# Patient Record
Sex: Male | Born: 1980 | Race: Black or African American | Hispanic: No | Marital: Single | Smoking: Former smoker
Health system: Southern US, Community
[De-identification: ages and names within clinical notes are randomized; demographics above are authoritative.]

## PROBLEM LIST (undated history)

## (undated) DIAGNOSIS — K2289 Other specified disease of esophagus: Secondary | ICD-10-CM

## (undated) DIAGNOSIS — K6389 Other specified diseases of intestine: Secondary | ICD-10-CM

## (undated) DIAGNOSIS — D649 Anemia, unspecified: Secondary | ICD-10-CM

## (undated) DIAGNOSIS — E119 Type 2 diabetes mellitus without complications: Secondary | ICD-10-CM

## (undated) DIAGNOSIS — Z794 Long term (current) use of insulin: Secondary | ICD-10-CM

## (undated) DIAGNOSIS — K317 Polyp of stomach and duodenum: Secondary | ICD-10-CM

## (undated) DIAGNOSIS — E538 Deficiency of other specified B group vitamins: Secondary | ICD-10-CM

## (undated) DIAGNOSIS — D490 Neoplasm of unspecified behavior of digestive system: Secondary | ICD-10-CM

## (undated) DIAGNOSIS — G629 Polyneuropathy, unspecified: Secondary | ICD-10-CM

## (undated) DIAGNOSIS — K3189 Other diseases of stomach and duodenum: Secondary | ICD-10-CM

## (undated) DIAGNOSIS — E1169 Type 2 diabetes mellitus with other specified complication: Secondary | ICD-10-CM

## (undated) DIAGNOSIS — E785 Hyperlipidemia, unspecified: Secondary | ICD-10-CM

## (undated) DIAGNOSIS — J189 Pneumonia, unspecified organism: Secondary | ICD-10-CM

## (undated) DIAGNOSIS — K5730 Diverticulosis of large intestine without perforation or abscess without bleeding: Secondary | ICD-10-CM

## (undated) DIAGNOSIS — N189 Chronic kidney disease, unspecified: Secondary | ICD-10-CM

## (undated) DIAGNOSIS — C801 Malignant (primary) neoplasm, unspecified: Secondary | ICD-10-CM

## (undated) DIAGNOSIS — K219 Gastro-esophageal reflux disease without esophagitis: Secondary | ICD-10-CM

## (undated) DIAGNOSIS — I10 Essential (primary) hypertension: Secondary | ICD-10-CM

## (undated) DIAGNOSIS — K4090 Unilateral inguinal hernia, without obstruction or gangrene, not specified as recurrent: Secondary | ICD-10-CM

## (undated) DIAGNOSIS — G4730 Sleep apnea, unspecified: Secondary | ICD-10-CM

## (undated) HISTORY — DX: Gastro-esophageal reflux disease without esophagitis: K21.9

## (undated) HISTORY — DX: Diverticulosis of large intestine without perforation or abscess without bleeding: K57.30

## (undated) HISTORY — PX: OTHER SURGICAL HISTORY: SHX169

## (undated) HISTORY — DX: Sleep apnea, unspecified: G47.30

## (undated) HISTORY — DX: Neoplasm of unspecified behavior of digestive system: D49.0

## (undated) HISTORY — DX: Pneumonia, unspecified organism: J18.9

## (undated) HISTORY — DX: Other specified diseases of intestine: K63.89

## (undated) HISTORY — DX: Hyperlipidemia, unspecified: E78.5

## (undated) HISTORY — PX: COLON SURGERY: SHX602

## (undated) HISTORY — DX: Other diseases of stomach and duodenum: K31.89

---

## 2005-04-07 ENCOUNTER — Emergency Department: Payer: Self-pay | Admitting: Emergency Medicine

## 2013-04-23 ENCOUNTER — Emergency Department: Payer: Self-pay | Admitting: Emergency Medicine

## 2013-04-23 LAB — URINALYSIS, COMPLETE
Bilirubin,UR: NEGATIVE
Glucose,UR: NEGATIVE mg/dL (ref 0–75)
Hyaline Cast: 4
Ketone: NEGATIVE
Leukocyte Esterase: NEGATIVE
Nitrite: NEGATIVE
Ph: 5 (ref 4.5–8.0)
Protein: NEGATIVE
RBC,UR: 127 /HPF (ref 0–5)
WBC UR: 6 /HPF (ref 0–5)

## 2013-04-23 LAB — CBC
HCT: 31.7 % — ABNORMAL LOW (ref 40.0–52.0)
HGB: 9.2 g/dL — ABNORMAL LOW (ref 13.0–18.0)
MCH: 18.1 pg — ABNORMAL LOW (ref 26.0–34.0)
MCV: 62 fL — ABNORMAL LOW (ref 80–100)
Platelet: 318 10*3/uL (ref 150–440)
RBC: 5.1 10*6/uL (ref 4.40–5.90)
WBC: 12.1 10*3/uL — ABNORMAL HIGH (ref 3.8–10.6)

## 2013-04-23 LAB — BASIC METABOLIC PANEL
Anion Gap: 9 (ref 7–16)
BUN: 15 mg/dL (ref 7–18)
Calcium, Total: 9.2 mg/dL (ref 8.5–10.1)
Chloride: 103 mmol/L (ref 98–107)
Co2: 26 mmol/L (ref 21–32)
Creatinine: 1.78 mg/dL — ABNORMAL HIGH (ref 0.60–1.30)
EGFR (African American): 57 — ABNORMAL LOW
Glucose: 188 mg/dL — ABNORMAL HIGH (ref 65–99)
Osmolality: 281 (ref 275–301)
Potassium: 3.6 mmol/L (ref 3.5–5.1)
Sodium: 138 mmol/L (ref 136–145)

## 2013-04-23 LAB — HEPATIC FUNCTION PANEL A (ARMC)
Alkaline Phosphatase: 69 U/L (ref 50–136)
Bilirubin, Direct: 0.1 mg/dL (ref 0.00–0.20)
Bilirubin,Total: 0.4 mg/dL (ref 0.2–1.0)
SGOT(AST): 13 U/L — ABNORMAL LOW (ref 15–37)
SGPT (ALT): 20 U/L (ref 12–78)
Total Protein: 7.9 g/dL (ref 6.4–8.2)

## 2015-08-18 ENCOUNTER — Emergency Department
Admission: EM | Admit: 2015-08-18 | Discharge: 2015-08-18 | Disposition: A | Discharge status: Home / Self Care | Payer: Self-pay | Attending: Emergency Medicine | Admitting: Emergency Medicine

## 2015-08-18 ENCOUNTER — Emergency Department: Payer: Self-pay

## 2015-08-18 DIAGNOSIS — R110 Nausea: Secondary | ICD-10-CM | POA: Insufficient documentation

## 2015-08-18 DIAGNOSIS — R1031 Right lower quadrant pain: Secondary | ICD-10-CM | POA: Insufficient documentation

## 2015-08-18 LAB — CBC WITH DIFFERENTIAL/PLATELET
Basophils Absolute: 0.1 10*3/uL (ref 0–0.1)
Basophils Relative: 1 %
EOS ABS: 0.1 10*3/uL (ref 0–0.7)
Eosinophils Relative: 1 %
HCT: 33 % — ABNORMAL LOW (ref 40.0–52.0)
Hemoglobin: 9.6 g/dL — ABNORMAL LOW (ref 13.0–18.0)
LYMPHS ABS: 2.8 10*3/uL (ref 1.0–3.6)
LYMPHS PCT: 41 %
MCH: 18.1 pg — ABNORMAL LOW (ref 26.0–34.0)
MCHC: 29.2 g/dL — AB (ref 32.0–36.0)
MCV: 62 fL — ABNORMAL LOW (ref 80.0–100.0)
Monocytes Absolute: 0.7 10*3/uL (ref 0.2–1.0)
Monocytes Relative: 10 %
NEUTROS ABS: 3.1 10*3/uL (ref 1.4–6.5)
Neutrophils Relative %: 47 %
Platelets: 240 10*3/uL (ref 150–440)
RBC: 5.32 MIL/uL (ref 4.40–5.90)
RDW: 18.9 % — AB (ref 11.5–14.5)
WBC: 6.8 10*3/uL (ref 3.8–10.6)

## 2015-08-18 LAB — URINALYSIS COMPLETE WITH MICROSCOPIC (ARMC ONLY)
Bacteria, UA: NONE SEEN
Bilirubin Urine: NEGATIVE
Glucose, UA: NEGATIVE mg/dL
Hgb urine dipstick: NEGATIVE
KETONES UR: NEGATIVE mg/dL
Leukocytes, UA: NEGATIVE
Nitrite: NEGATIVE
PROTEIN: NEGATIVE mg/dL
Specific Gravity, Urine: 1.027 (ref 1.005–1.030)
pH: 5 (ref 5.0–8.0)

## 2015-08-18 LAB — COMPREHENSIVE METABOLIC PANEL
ALBUMIN: 4.1 g/dL (ref 3.5–5.0)
ALK PHOS: 55 U/L (ref 38–126)
ALT: 13 U/L — ABNORMAL LOW (ref 17–63)
ANION GAP: 8 (ref 5–15)
AST: 20 U/L (ref 15–41)
BILIRUBIN TOTAL: 0.4 mg/dL (ref 0.3–1.2)
BUN: 12 mg/dL (ref 6–20)
CALCIUM: 8.9 mg/dL (ref 8.9–10.3)
CO2: 26 mmol/L (ref 22–32)
Chloride: 106 mmol/L (ref 101–111)
Creatinine, Ser: 1.41 mg/dL — ABNORMAL HIGH (ref 0.61–1.24)
GFR calc Af Amer: >60 mL/min (ref >60)
GLUCOSE: 90 mg/dL (ref 65–99)
POTASSIUM: 3.7 mmol/L (ref 3.5–5.1)
Sodium: 140 mmol/L (ref 135–145)
TOTAL PROTEIN: 7.6 g/dL (ref 6.5–8.1)

## 2015-08-18 LAB — LIPASE, BLOOD: Lipase: 29 U/L (ref 22–51)

## 2015-08-18 MED ORDER — IOHEXOL 300 MG/ML  SOLN
100.0000 mL | Freq: Once | INTRAMUSCULAR | Status: AC | PRN
  Administered 2015-08-18: 100 mL via INTRAVENOUS
  Filled 2015-08-18: qty 100

## 2015-08-18 MED ORDER — TAMSULOSIN HCL 0.4 MG PO CAPS: 0.4000 mg | ORAL_CAPSULE | Freq: Every day | ORAL | Status: DC

## 2015-08-18 MED ORDER — IOHEXOL 240 MG/ML SOLN
25.0000 mL | Freq: Once | INTRAMUSCULAR | Status: AC | PRN
  Administered 2015-08-18: 25 mL via ORAL
  Filled 2015-08-18: qty 25

## 2015-08-18 MED ORDER — ONDANSETRON HCL 4 MG/2ML IJ SOLN
4.0000 mg | Freq: Once | INTRAMUSCULAR | Status: AC
  Administered 2015-08-18: 4 mg via INTRAVENOUS
  Filled 2015-08-18: qty 2

## 2015-08-18 MED ORDER — IBUPROFEN 800 MG PO TABS: 800.0000 mg | ORAL_TABLET | Freq: Three times a day (TID) | ORAL | Status: DC | PRN

## 2015-08-18 MED ORDER — MORPHINE SULFATE (PF) 2 MG/ML IV SOLN
2.0000 mg | Freq: Once | INTRAVENOUS | Status: AC
  Administered 2015-08-18: 2 mg via INTRAVENOUS
  Filled 2015-08-18: qty 1

## 2015-08-18 MED ORDER — HYDROCODONE-ACETAMINOPHEN 5-325 MG PO TABS
1.0000 | ORAL_TABLET | Freq: Four times a day (QID) | ORAL | Status: DC | PRN
Start: 2015-08-18 — End: 2016-09-08

## 2015-08-18 MED ORDER — SODIUM CHLORIDE 0.9 % IV BOLUS (SEPSIS)
1000.0000 mL | Freq: Once | INTRAVENOUS | Status: AC
  Administered 2015-08-18: 1000 mL via INTRAVENOUS

## 2015-08-18 NOTE — ED Notes (Signed)
Pt states that he started having rlq pain over the past couple weeks, states that he has gotten worse, states that he has a hx of kidney stones on the llq in the past

## 2015-08-18 NOTE — Discharge Instructions (Signed)
You were evaluated for intermittent right-sided abdominal pain, and your CT scan and laboratory evaluation are reassuring. Because this feels similar to prior kidney stones, and the symptoms may be related to kidney stones, I am going to place you on medications to help with pain control as well as Flomax to help with possible kidney stone. You should follow up with primary care physician, you're referred to the [facility] clinic for follow-up.  Return to the emergency room for any worsening condition including fever, worsening pain, or bloody stool, vomiting, inability to urinate, or any other symptoms concerning to you.   Abdominal Pain, Adult Many things can cause belly (abdominal) pain. Most times, the belly pain is not dangerous. Many cases of belly pain can be watched and treated at home. HOME CARE   Do not take medicines that help you go poop (laxatives) unless told to by your doctor.  Only take medicine as told by your doctor.  Eat or drink as told by your doctor. Your doctor will tell you if you should be on a special diet. GET HELP IF:  You do not know what is causing your belly pain.  You have belly pain while you are sick to your stomach (nauseous) or have runny poop (diarrhea).  You have pain while you pee or poop.  Your belly pain wakes you up at night.  You have belly pain that gets worse or better when you eat.  You have belly pain that gets worse when you eat fatty foods.  You have a fever. GET HELP RIGHT AWAY IF:   The pain does not go away within 2 hours.  You keep throwing up (vomiting).  The pain changes and is only in the right or left part of the belly.  You have bloody or tarry looking poop. MAKE SURE YOU:   Understand these instructions.  Will watch your condition.  Will get help right away if you are not doing well or get worse.   This information is not intended to replace advice given to you by your health care provider. Make sure you discuss  any questions you have with your health care provider.   Document Released: 04/06/2008 Document Revised: 11/09/2014 Document Reviewed: 06/28/2013 Elsevier Interactive Patient Education 2016 [place]. Kidney Stones Kidney stones (urolithiasis) are deposits that form inside your kidneys. The intense pain is caused by the stone moving through the urinary tract. When the stone moves, the ureter goes into spasm around the stone. The stone is usually passed in the urine.  CAUSES   A disorder that makes certain neck glands produce too much parathyroid hormone (primary hyperparathyroidism).  A buildup of uric acid crystals, similar to gout in your joints.  Narrowing (stricture) of the ureter.  A kidney obstruction present at birth (congenital obstruction).  Previous surgery on the kidney or ureters.  Numerous kidney infections. SYMPTOMS   Feeling sick to your stomach (nauseous).  Throwing up (vomiting).  Blood in the urine (hematuria).  Pain that usually spreads (radiates) to the groin.  Frequency or urgency of urination. DIAGNOSIS   Taking a history and physical exam.  Blood or urine tests.  CT scan.  Occasionally, an examination of the inside of the urinary bladder (cystoscopy) is performed. TREATMENT   Observation.  Increasing your fluid intake.  Extracorporeal shock wave lithotripsy--This is a noninvasive procedure that uses shock waves to break up kidney stones.  Surgery may be needed if you have severe pain or persistent obstruction. There are various  surgical procedures. Most of the procedures are performed with the use of small instruments. Only small incisions are needed to accommodate these instruments, so recovery time is minimized. The size, location, and chemical composition are all important variables that will determine the proper choice of action for you. Talk to your health care provider to better understand your situation so that you will minimize the  risk of injury to yourself and your kidney.  HOME CARE INSTRUCTIONS   Drink enough water and fluids to keep your urine clear or pale yellow. This will help you to pass the stone or stone fragments.  Strain all urine through the provided strainer. Keep all particulate matter and stones for your health care provider to see. The stone causing the pain may be as small as a grain of salt. It is very important to use the strainer each and every time you pass your urine. The collection of your stone will allow your health care provider to analyze it and verify that a stone has actually passed. The stone analysis will often identify what you can do to reduce the incidence of recurrences.  Only take over-the-counter or prescription medicines for pain, discomfort, or fever as directed by your health care provider.  Keep all follow-up visits as told by your health care provider. This is important.  Get follow-up X-rays if required. The absence of pain does not always mean that the stone has passed. It may have only stopped moving. If the urine remains completely obstructed, it can cause loss of kidney function or even complete destruction of the kidney. It is your responsibility to make sure X-rays and follow-ups are completed. Ultrasounds of the kidney can show blockages and the status of the kidney. Ultrasounds are not associated with any radiation and can be performed easily in a matter of minutes.  Make changes to your daily diet as told by your health care provider. You may be told to:  Limit the amount of salt that you eat.  Eat 5 or more servings of fruits and vegetables each day.  Limit the amount of meat, poultry, fish, and eggs that you eat.  Collect a 24-hour urine sample as told by your health care provider.You may need to collect another urine sample every 6-12 months. SEEK MEDICAL CARE IF:  You experience pain that is progressive and unresponsive to any pain medicine you have been  prescribed. SEEK IMMEDIATE MEDICAL CARE IF:   Pain cannot be controlled with the prescribed medicine.  You have a fever or shaking chills.  The severity or intensity of pain increases over 18 hours and is not relieved by pain medicine.  You develop a new onset of abdominal pain.  You feel faint or pass out.  You are unable to urinate.   This information is not intended to replace advice given to you by your health care provider. Make sure you discuss any questions you have with your health care provider.   Document Released: 10/19/2005 Document Revised: 07/10/2015 Document Reviewed: 03/22/2013 Elsevier Interactive Patient Education [employer].

## 2015-08-18 NOTE — ED Provider Notes (Signed)
[facility] Emergency Department Provider Note   ____________________________________________  Time seen: On arrival to ED room I have reviewed the triage vital signs and the triage nursing note.  HISTORY  Chief Complaint Abdominal Pain   Historian Patient  HPI Luke [name] is a 34 y.o. male is here for evaluation of right lower quadrant pain. He states he had a pretty severe episode earlier today where he was nauseated, and cold sweat, with severe sharp right lower quadrant pain. He thinks this pain is somewhat similar to when he had kidney stones in the past. Although he is denying hematuria or dysuria or flank pain. He states that he has had some intermittent pain over the past several weeks, but today was much worse. He is denying any trauma or overuse injury. States currently his pain is 5 out of 10. No fever.    No past medical history on file.  There are no active problems to display for this patient.   No past surgical history on file.  Current Outpatient Rx  Name  Route  Sig  Dispense  Refill  . HYDROcodone-acetaminophen (NORCO/VICODIN) 5-325 MG tablet   Oral   Take 1 tablet by mouth every 6 (six) hours as needed for moderate pain.   5 tablet   0   . ibuprofen (ADVIL,MOTRIN) 800 MG tablet   Oral   Take 1 tablet (800 mg total) by mouth every 8 (eight) hours as needed.   30 tablet   0   . tamsulosin (FLOMAX) 0.4 MG CAPS capsule   Oral   Take 1 capsule (0.4 mg total) by mouth daily.   7 capsule   0     Allergies Review of patient's allergies indicates no known allergies.  No family history on file.  Social History Social History  Substance Use Topics  . Smoking status: Not on file  . Smokeless tobacco: Not on file  . Alcohol Use: Not on file  lives at home, no primary care physician.  Review of Systems  Constitutional: Negative for fever. Eyes: Negative for visual changes. ENT: Negative for sore  throat. Cardiovascular: Negative for chest pain. Respiratory: Negative for shortness of breath. Gastrointestinal: Negative for vomiting and diarrhea. Genitourinary: Negative for dysuria. denies testicular or penis pain. Musculoskeletal: Negative for back pain. Skin: Negative for rash. Neurological: Negative for headache. 10 point Review of Systems otherwise negative ____________________________________________   PHYSICAL EXAM:  VITAL SIGNS: ED Triage Vitals  Enc Vitals Group     BP 08/18/15 1458 145/89 mmHg     Pulse Rate 08/18/15 1458 91     Resp 08/18/15 1458 20     Temp 08/18/15 1458 98.5 F (36.9 C)     Temp Source 08/18/15 1458 Oral     SpO2 08/18/15 1458 100 %     Weight 08/18/15 1458 190 lb (86.183 kg)     Height 08/18/15 1458 5\' 9"  (1.753 m)     Head Cir --      Peak Flow --      Pain Score 08/18/15 1514 7     Pain Loc --      Pain Edu? --      Excl. in [place]? --      Constitutional: Alert and oriented. Well appearing and in no distress. Eyes: Conjunctivae are normal. PERRL. Normal extraocular movements. ENT   Head: Normocephalic and atraumatic.   Nose: No congestion/rhinnorhea.   Mouth/Throat: Mucous membranes are moist.   Neck: No stridor. Cardiovascular/Chest:  Normal rate, regular rhythm.  No murmurs, rubs, or gallops. Respiratory: Normal respiratory effort without tachypnea nor retractions. Breath sounds are clear and equal bilaterally. No wheezes/rales/rhonchi. Gastrointestinal: Soft. No distention, no guarding, no rebound. Moderate tenderness to palpation in the right lower quadrant.  Genitourinary/rectal:Deferred Musculoskeletal: Nontender with normal range of motion in all extremities. No joint effusions.  No lower extremity tenderness.  No edema. Neurologic:  Normal speech and language. No gross or focal neurologic deficits are appreciated. Skin:  Skin is warm, dry and intact. No rash noted. Psychiatric: Mood and affect are normal. Speech and  behavior are normal. Patient exhibits appropriate insight and judgment.  ____________________________________________   EKG I, Lisa Roca, MD, the attending physician have personally viewed and interpreted all ECGs.  No EKG performed ____________________________________________  LABS (pertinent positives/negatives)  Lipase 29 , Oriented metabolic panel significant for creatinine 1.41 and otherwise no acute abnormalities White blood count 6.8, hemoglobin 9.6, platelet count 240 Urinalysis negative for signs of infection or red blood cells  ____________________________________________  RADIOLOGY All Xrays were viewed by me. Imaging interpreted by Radiologist.  CT abdomen and pelvis with contrast:  IMPRESSION: 1. No acute findings. No findings to explain right lower quadrant pain. Normal appendix visualized. 2. Normal kidneys, ureters and bladder. No evidence of a renal or ureteral stone or obstructive uropathy. 3. Scattered colonic diverticula without diverticulitis. No other abnormalities. __________________________________________  PROCEDURES  Procedure(s) performed: None  Critical Care performed: None  ____________________________________________   ED COURSE / ASSESSMENT AND PLAN  CONSULTATIONS: None  Pertinent labs & imaging results that were available during my care of the patient were reviewed by me and considered in my medical decision making (see chart for details).   Clinically unsure if this is due to a kidney stone pain versus possible appendicitis with right lower quadrant tenderness to palpation. I discussed with him obtaining CT to rule out the most concerning surgical emergency in this location being appendicitis.   CT the abdomen is reassuring, without evidence of acute surgical emergency. The ureter showed no sign of hydronephrosis, however given the patient's symptoms are intermittent, and feels similar to prior kidney stones, I will go ahead  and treat him symptomatically for possible kidney stone. I've asked him to follow up with primary care physician, and referred to [facility] clinic.   Patient / Family / Caregiver informed of clinical course, medical decision-making process, and agree with plan.   I discussed return precautions, follow-up instructions, and discharged instructions with patient and/or family.  ___________________________________________   FINAL CLINICAL IMPRESSION(S) / ED DIAGNOSES   Final diagnoses:  Right lower quadrant abdominal pain       Lisa Roca, MD 08/18/15 1736

## 2015-08-18 NOTE — ED Notes (Signed)
Right flank and abd pain  Hx of kidney stones   Feels the same

## 2016-09-07 ENCOUNTER — Encounter: Payer: Self-pay | Admitting: Emergency Medicine

## 2016-09-07 ENCOUNTER — Emergency Department: Payer: BLUE CROSS/BLUE SHIELD

## 2016-09-07 ENCOUNTER — Inpatient Hospital Stay
Admission: EM | Admit: 2016-09-07 | Discharge: 2016-09-08 | DRG: 177 | Disposition: A | Discharge status: Home / Self Care | Payer: BLUE CROSS/BLUE SHIELD | Attending: Internal Medicine | Admitting: Internal Medicine

## 2016-09-07 DIAGNOSIS — F17210 Nicotine dependence, cigarettes, uncomplicated: Secondary | ICD-10-CM | POA: Diagnosis present

## 2016-09-07 DIAGNOSIS — T40601A Poisoning by unspecified narcotics, accidental (unintentional), initial encounter: Secondary | ICD-10-CM | POA: Diagnosis present

## 2016-09-07 DIAGNOSIS — J690 Pneumonitis due to inhalation of food and vomit: Principal | ICD-10-CM | POA: Diagnosis present

## 2016-09-07 DIAGNOSIS — Z79899 Other long term (current) drug therapy: Secondary | ICD-10-CM | POA: Diagnosis not present

## 2016-09-07 DIAGNOSIS — N170 Acute kidney failure with tubular necrosis: Secondary | ICD-10-CM | POA: Diagnosis present

## 2016-09-07 DIAGNOSIS — N183 Chronic kidney disease, stage 3 (moderate): Secondary | ICD-10-CM | POA: Diagnosis present

## 2016-09-07 DIAGNOSIS — F10129 Alcohol abuse with intoxication, unspecified: Secondary | ICD-10-CM | POA: Diagnosis present

## 2016-09-07 DIAGNOSIS — E872 Acidosis: Secondary | ICD-10-CM | POA: Diagnosis present

## 2016-09-07 DIAGNOSIS — J9691 Respiratory failure, unspecified with hypoxia: Secondary | ICD-10-CM | POA: Diagnosis present

## 2016-09-07 DIAGNOSIS — E860 Dehydration: Secondary | ICD-10-CM | POA: Diagnosis present

## 2016-09-07 DIAGNOSIS — F10920 Alcohol use, unspecified with intoxication, uncomplicated: Secondary | ICD-10-CM

## 2016-09-07 DIAGNOSIS — R4189 Other symptoms and signs involving cognitive functions and awareness: Secondary | ICD-10-CM

## 2016-09-07 DIAGNOSIS — J189 Pneumonia, unspecified organism: Secondary | ICD-10-CM | POA: Diagnosis present

## 2016-09-07 DIAGNOSIS — I129 Hypertensive chronic kidney disease with stage 1 through stage 4 chronic kidney disease, or unspecified chronic kidney disease: Secondary | ICD-10-CM | POA: Diagnosis present

## 2016-09-07 DIAGNOSIS — T405X1A Poisoning by cocaine, accidental (unintentional), initial encounter: Secondary | ICD-10-CM | POA: Diagnosis present

## 2016-09-07 DIAGNOSIS — R4182 Altered mental status, unspecified: Secondary | ICD-10-CM | POA: Diagnosis present

## 2016-09-07 DIAGNOSIS — R0902 Hypoxemia: Secondary | ICD-10-CM

## 2016-09-07 HISTORY — DX: Pneumonia, unspecified organism: J18.9

## 2016-09-07 LAB — COMPREHENSIVE METABOLIC PANEL
ALK PHOS: 54 U/L (ref 38–126)
ALT: 16 U/L — AB (ref 17–63)
AST: 24 U/L (ref 15–41)
Albumin: 4.1 g/dL (ref 3.5–5.0)
Anion gap: 16 — ABNORMAL HIGH (ref 5–15)
BUN: 15 mg/dL (ref 6–20)
CALCIUM: 8.4 mg/dL — AB (ref 8.9–10.3)
CO2: 16 mmol/L — AB (ref 22–32)
CREATININE: 1.94 mg/dL — AB (ref 0.61–1.24)
Chloride: 105 mmol/L (ref 101–111)
GFR, EST AFRICAN AMERICAN: 50 mL/min — AB (ref >60)
GFR, EST NON AFRICAN AMERICAN: 43 mL/min — AB (ref >60)
Glucose, Bld: 290 mg/dL — ABNORMAL HIGH (ref 65–99)
Potassium: 3.1 mmol/L — ABNORMAL LOW (ref 3.5–5.1)
Sodium: 137 mmol/L (ref 135–145)
Total Bilirubin: 0.2 mg/dL — ABNORMAL LOW (ref 0.3–1.2)
Total Protein: 7.9 g/dL (ref 6.5–8.1)

## 2016-09-07 LAB — CBC WITH DIFFERENTIAL/PLATELET
Basophils Absolute: 0.1 10*3/uL (ref 0–0.1)
Basophils Relative: 1 %
Eosinophils Absolute: 0.1 10*3/uL (ref 0–0.7)
Eosinophils Relative: 1 %
HEMATOCRIT: 36.9 % — AB (ref 40.0–52.0)
HEMOGLOBIN: 10.5 g/dL — AB (ref 13.0–18.0)
LYMPHS ABS: 4.7 10*3/uL — AB (ref 1.0–3.6)
LYMPHS PCT: 47 %
MCH: 19.1 pg — AB (ref 26.0–34.0)
MCHC: 28.5 g/dL — ABNORMAL LOW (ref 32.0–36.0)
MCV: 66.8 fL — AB (ref 80.0–100.0)
Monocytes Absolute: 0.7 10*3/uL (ref 0.2–1.0)
Monocytes Relative: 7 %
NEUTROS PCT: 44 %
Neutro Abs: 4.4 10*3/uL (ref 1.4–6.5)
Platelets: 264 10*3/uL (ref 150–440)
RBC: 5.53 MIL/uL (ref 4.40–5.90)
RDW: 19.1 % — ABNORMAL HIGH (ref 11.5–14.5)
WBC: 10 10*3/uL (ref 3.8–10.6)

## 2016-09-07 LAB — URINE DRUG SCREEN, QUALITATIVE (ARMC ONLY)
Amphetamines, Ur Screen: NOT DETECTED
BARBITURATES, UR SCREEN: NOT DETECTED
Benzodiazepine, Ur Scrn: NOT DETECTED
CANNABINOID 50 NG, UR ~~LOC~~: NOT DETECTED
COCAINE METABOLITE, UR ~~LOC~~: POSITIVE — AB
MDMA (ECSTASY) UR SCREEN: NOT DETECTED
METHADONE SCREEN, URINE: NOT DETECTED
Opiate, Ur Screen: POSITIVE — AB
Phencyclidine (PCP) Ur S: NOT DETECTED
Tricyclic, Ur Screen: NOT DETECTED

## 2016-09-07 LAB — ACETAMINOPHEN LEVEL

## 2016-09-07 LAB — MRSA PCR SCREENING: MRSA by PCR: NEGATIVE

## 2016-09-07 LAB — SALICYLATE LEVEL

## 2016-09-07 LAB — GLUCOSE, CAPILLARY
Glucose-Capillary: 75 mg/dL (ref 65–99)
Glucose-Capillary: 85 mg/dL (ref 65–99)

## 2016-09-07 LAB — BLOOD GAS, ARTERIAL
ACID-BASE DEFICIT: 12 mmol/L — AB (ref 0.0–2.0)
BICARBONATE: 16 mmol/L — AB (ref 20.0–28.0)
FIO2: 1
O2 Saturation: 98.3 %
PATIENT TEMPERATURE: 37
PH ART: 7.17 — AB (ref 7.350–7.450)
pCO2 arterial: 44 mmHg (ref 32.0–48.0)
pO2, Arterial: 136 mmHg — ABNORMAL HIGH (ref 83.0–108.0)

## 2016-09-07 LAB — TSH: TSH: 2.607 u[IU]/mL (ref 0.350–4.500)

## 2016-09-07 LAB — ETHANOL: ALCOHOL ETHYL (B): 149 mg/dL — AB (ref <5)

## 2016-09-07 MED ORDER — IPRATROPIUM-ALBUTEROL 0.5-2.5 (3) MG/3ML IN SOLN
3.0000 mL | RESPIRATORY_TRACT | Status: DC
  Administered 2016-09-07: 3 mL via RESPIRATORY_TRACT
  Filled 2016-09-07 (×2): qty 3

## 2016-09-07 MED ORDER — ORAL CARE MOUTH RINSE
15.0000 mL | Freq: Two times a day (BID) | OROMUCOSAL | Status: DC
  Administered 2016-09-07: 15 mL via OROMUCOSAL

## 2016-09-07 MED ORDER — AMLODIPINE BESYLATE 5 MG PO TABS
5.0000 mg | ORAL_TABLET | Freq: Every day | ORAL | Status: DC
  Administered 2016-09-07: 5 mg via ORAL
  Filled 2016-09-07: qty 1

## 2016-09-07 MED ORDER — ONDANSETRON HCL 4 MG/2ML IJ SOLN
4.0000 mg | Freq: Four times a day (QID) | INTRAMUSCULAR | Status: DC | PRN
  Administered 2016-09-07: 4 mg via INTRAVENOUS

## 2016-09-07 MED ORDER — AZITHROMYCIN 250 MG PO TABS
250.0000 mg | ORAL_TABLET | Freq: Every day | ORAL | Status: DC
  Administered 2016-09-08: 250 mg via ORAL
  Filled 2016-09-07 (×2): qty 1

## 2016-09-07 MED ORDER — ACETAMINOPHEN 650 MG RE SUPP: 650.0000 mg | Freq: Four times a day (QID) | RECTAL | Status: DC | PRN

## 2016-09-07 MED ORDER — SODIUM CHLORIDE 0.9 % IV BOLUS (SEPSIS)
1000.0000 mL | Freq: Once | INTRAVENOUS | Status: AC
  Administered 2016-09-07: 1000 mL via INTRAVENOUS

## 2016-09-07 MED ORDER — POTASSIUM CHLORIDE IN NACL 40-0.9 MEQ/L-% IV SOLN
INTRAVENOUS | Status: DC
  Administered 2016-09-07 – 2016-09-08 (×4): 125 mL/h via INTRAVENOUS
  Filled 2016-09-07 (×7): qty 1000

## 2016-09-07 MED ORDER — ENOXAPARIN SODIUM 40 MG/0.4ML ~~LOC~~ SOLN
40.0000 mg | SUBCUTANEOUS | Status: DC
  Administered 2016-09-07: 40 mg via SUBCUTANEOUS
  Filled 2016-09-07: qty 0.4

## 2016-09-07 MED ORDER — SODIUM CHLORIDE 0.9% FLUSH
3.0000 mL | Freq: Two times a day (BID) | INTRAVENOUS | Status: DC
  Administered 2016-09-07 – 2016-09-08 (×3): 3 mL via INTRAVENOUS

## 2016-09-07 MED ORDER — DOCUSATE SODIUM 100 MG PO CAPS
100.0000 mg | ORAL_CAPSULE | Freq: Two times a day (BID) | ORAL | Status: DC
  Administered 2016-09-07 – 2016-09-08 (×3): 100 mg via ORAL
  Filled 2016-09-07 (×3): qty 1

## 2016-09-07 MED ORDER — AMOXICILLIN 500 MG PO CAPS
1000.0000 mg | ORAL_CAPSULE | Freq: Two times a day (BID) | ORAL | Status: DC
  Administered 2016-09-07 – 2016-09-08 (×3): 1000 mg via ORAL
  Filled 2016-09-07 (×4): qty 2

## 2016-09-07 MED ORDER — IPRATROPIUM-ALBUTEROL 0.5-2.5 (3) MG/3ML IN SOLN: 3.0000 mL | RESPIRATORY_TRACT | Status: DC | PRN

## 2016-09-07 MED ORDER — AZITHROMYCIN 500 MG PO TABS
500.0000 mg | ORAL_TABLET | Freq: Every day | ORAL | Status: AC
  Administered 2016-09-07: 500 mg via ORAL
  Filled 2016-09-07: qty 1

## 2016-09-07 MED ORDER — HEPARIN SODIUM (PORCINE) 5000 UNIT/ML IJ SOLN
5000.0000 [IU] | Freq: Three times a day (TID) | INTRAMUSCULAR | Status: DC
  Administered 2016-09-07 (×2): 5000 [IU] via SUBCUTANEOUS
  Filled 2016-09-07 (×2): qty 1

## 2016-09-07 MED ORDER — ONDANSETRON HCL 4 MG PO TABS: 4.0000 mg | ORAL_TABLET | Freq: Four times a day (QID) | ORAL | Status: DC | PRN

## 2016-09-07 MED ORDER — HYDROCODONE-ACETAMINOPHEN 5-325 MG PO TABS: 1.0000 | ORAL_TABLET | Freq: Four times a day (QID) | ORAL | Status: DC | PRN

## 2016-09-07 MED ORDER — CLINDAMYCIN PHOSPHATE 600 MG/50ML IV SOLN
600.0000 mg | Freq: Once | INTRAVENOUS | Status: AC
  Administered 2016-09-07: 600 mg via INTRAVENOUS
  Filled 2016-09-07: qty 50

## 2016-09-07 MED ORDER — ONDANSETRON HCL 4 MG/2ML IJ SOLN
INTRAMUSCULAR | Status: AC
  Filled 2016-09-07: qty 2

## 2016-09-07 MED ORDER — ACETAMINOPHEN 325 MG PO TABS
650.0000 mg | ORAL_TABLET | Freq: Four times a day (QID) | ORAL | Status: DC | PRN
Start: 2016-09-07 — End: 2016-09-08

## 2016-09-07 MED ORDER — SODIUM BICARBONATE 8.4 % IV SOLN
INTRAVENOUS | Status: AC
  Filled 2016-09-07: qty 50

## 2016-09-07 NOTE — ED Provider Notes (Signed)
[facility] Emergency Department Provider Note   ____________________________________________   First MD Initiated Contact with Patient 09/07/16 [id number]     (approximate)  I have reviewed the triage vital signs and the nursing notes.   HISTORY  Chief Complaint Loss of Consciousness    HPI Luke [name] is a 35 y.o. male brought by EMS from a friend's house with a chief complaint of unresponsive. EMS reports patient was passed out on the front porch of her friend's house. Upon their arrival they found patient to be diaphoretic with emesis on his clothes. EMS gave 2.5 mg intranasal narcan and 2 mg IV Narcan which awakened patient. Patient arrives to the emergency department conscious with nasal trumpet in place which he promptly removed.Patient tells me he "fell out". Admits to EtOH use. Vague regarding drug use, especially opiates. Denies recent fever, chills, chest pain, shortness of breath, abdominal pain. Denies recent travel or trauma. Nothing makes his symptoms better or worse.    Past medical history None  There are no active problems to display for this patient.   History reviewed. No pertinent surgical history.  Prior to Admission medications   Medication Sig Start Date End Date Taking? Authorizing Provider  HYDROcodone-acetaminophen (NORCO/VICODIN) 5-325 MG tablet Take 1 tablet by mouth every 6 (six) hours as needed for moderate pain. Patient not taking: Reported on 09/07/2016 08/18/15   Lisa Roca, MD  ibuprofen (ADVIL,MOTRIN) 800 MG tablet Take 1 tablet (800 mg total) by mouth every 8 (eight) hours as needed. Patient not taking: Reported on 09/07/2016 08/18/15   Lisa Roca, MD  tamsulosin (FLOMAX) 0.4 MG CAPS capsule Take 1 capsule (0.4 mg total) by mouth daily. Patient not taking: Reported on 09/07/2016 08/18/15   Lisa Roca, MD    Allergies Patient has no known allergies.  History reviewed. No pertinent family history.  Social  History Social History  Substance Use Topics  . Smoking status: Current Some Day Smoker    Packs/day: 0.50    Types: Cigarettes  . Smokeless tobacco: Never Used  . Alcohol use Yes     Comment: liquor    Review of Systems  Constitutional: Positive for unresponsive. No fever/chills. Eyes: No visual changes. ENT: No sore throat. Cardiovascular: Denies chest pain. Respiratory: Denies shortness of breath. Gastrointestinal: No abdominal pain.  Positive for vomiting.  No diarrhea.  No constipation. Genitourinary: Negative for dysuria. Musculoskeletal: Negative for back pain. Skin: Negative for rash. Neurological: Negative for headaches, focal weakness or numbness.  10-point ROS otherwise negative.  ____________________________________________   PHYSICAL EXAM:  VITAL SIGNS: ED Triage Vitals  Enc Vitals Group     BP 09/07/16 0058 112/76     Pulse Rate 09/07/16 0058 100     Resp 09/07/16 0058 20     Temp 09/07/16 0058 98.2 F (36.8 C)     Temp Source 09/07/16 0058 Oral     SpO2 09/07/16 0058 (!) 80 %     Weight 09/07/16 0059 235 lb (106.6 kg)     Height 09/07/16 0059 5\' 10"  (1.778 m)     Head Circumference --      Peak Flow --      Pain Score --      Pain Loc --      Pain Edu? --      Excl. in [place]? --     Constitutional: Alert and oriented. Well appearing and in no acute distress. Appears somewhat dazed but answers questions appropriately. Cooperative. Eyes: Conjunctivae  are normal. PERRL. EOMI. Head: Atraumatic. Nose: No congestion/rhinnorhea. Mouth/Throat: Mucous membranes are moist.  Oropharynx non-erythematous. Neck: No stridor.  No cervical spine tenderness to palpation. Cardiovascular: Normal rate, regular rhythm. Grossly normal heart sounds.  Good peripheral circulation. Respiratory: Normal respiratory effort.  No retractions. Lungs slightly diminished bibasilarly. Gastrointestinal: Soft and nontender. No distention. No abdominal bruits. No CVA  tenderness. Musculoskeletal: No lower extremity tenderness nor edema.  No joint effusions. Neurologic:  Normal speech and language. No gross focal neurologic deficits are appreciated. MAEx4. Skin:  Skin is warm, dry and intact. No rash noted. Psychiatric: Mood and affect are normal. Speech and behavior are normal.  ____________________________________________   LABS (all labs ordered are listed, but only abnormal results are displayed)  Labs Reviewed  CBC WITH DIFFERENTIAL/PLATELET - Abnormal; Notable for the following:       Result Value   Hemoglobin 10.5 (*)    HCT 36.9 (*)    MCV 66.8 (*)    MCH 19.1 (*)    MCHC 28.5 (*)    RDW 19.1 (*)    Lymphs Abs 4.7 (*)    All other components within normal limits  COMPREHENSIVE METABOLIC PANEL - Abnormal; Notable for the following:    Potassium 3.1 (*)    CO2 16 (*)    Glucose, Bld 290 (*)    Creatinine, Ser 1.94 (*)    Calcium 8.4 (*)    ALT 16 (*)    Total Bilirubin 0.2 (*)    GFR calc non Af Amer 43 (*)    GFR calc Af Amer 50 (*)    Anion gap 16 (*)    All other components within normal limits  ETHANOL - Abnormal; Notable for the following:    Alcohol, Ethyl (B) 149 (*)    All other components within normal limits  ACETAMINOPHEN LEVEL - Abnormal; Notable for the following:    Acetaminophen (Tylenol), Serum <10 (*)    All other components within normal limits  BLOOD GAS, ARTERIAL - Abnormal; Notable for the following:    pH, Arterial 7.17 (*)    pO2, Arterial 136 (*)    Bicarbonate 16.0 (*)    Acid-base deficit 12.0 (*)    All other components within normal limits  CULTURE, BLOOD (ROUTINE X 2)  CULTURE, BLOOD (ROUTINE X 2)  SALICYLATE LEVEL  URINE DRUG SCREEN, QUALITATIVE ([facility] ONLY)   ____________________________________________  EKG  ED ECG REPORT I, Alfrieda Tarry J, the attending physician, personally viewed and interpreted this ECG.   Date: 09/07/2016  EKG Time: 0052  Rate: 108  Rhythm: sinus tachycardia  Axis:  Normal  Intervals:right bundle branch block  ST&T Change: Nonspecific  ____________________________________________  RADIOLOGY  CT head interpreted per Dr. Gerilyn Nestle: No acute intracranial abnormalities.  Normal chest x-ray (viewed by me, interpreted per Dr. Gerilyn Nestle): Shallow inspiration with atelectasis or infiltration in both lung  bases.   ____________________________________________   PROCEDURES  Procedure(s) performed: None  Procedures  Critical Care performed: Yes, see critical care note(s)   CRITICAL CARE Performed by: Paulette Blanch   Total critical care time: 30 minutes  Critical care time was exclusive of separately billable procedures and treating other patients.  Critical care was necessary to treat or prevent imminent or life-threatening deterioration.  Critical care was time spent personally by me on the following activities: development of treatment plan with patient and/or surrogate as well as nursing, discussions with consultants, evaluation of patient's response to treatment, examination of patient, obtaining history from patient or  surrogate, ordering and performing treatments and interventions, ordering and review of laboratory studies, ordering and review of radiographic studies, pulse oximetry and re-evaluation of patient's condition.  ____________________________________________   INITIAL IMPRESSION / ASSESSMENT AND PLAN / ED COURSE  Pertinent labs & imaging results that were available during my care of the patient were reviewed by me and considered in my medical decision making (see chart for details).  35 year old male found unresponsive at a friend's house. Awakened with Narcan. He is in no respiratory distress, but room air saturations are 80%. Improves to 100% on nonrebreather oxygen. Will obtain screening lab work including toxicological screen, chest x-ray, ABG and reassess.  Clinical Course as of Sep 08 351  [facility] Sep 07, 2016  0306 Will  start clindamycin for presumed aspiration. Patient arousable to voice. Fianc at bedside who denies drug ingestion other than marijuana. Will trial patient off oxygen.  [JS]  [id number] Tried patient off oxygen; patient quickly desaturated to 80%. Nonrebreather oxygen mask reapplied. Will discuss with hospitalist to evaluate patient in the emergency department for admission.  [JS]    Clinical Course User Index [JS] Paulette Blanch, MD     ____________________________________________   FINAL CLINICAL IMPRESSION(S) / ED DIAGNOSES  Final diagnoses:  Hypoxia  Aspiration pneumonia of both lower lobes, unspecified aspiration pneumonia type ([place])  Unresponsive  Alcoholic intoxication without complication ([place])      NEW MEDICATIONS STARTED DURING THIS VISIT:  New Prescriptions   No medications on file     Note:  This document was prepared using Dragon voice recognition software and may include unintentional dictation errors.    Paulette Blanch, MD 09/07/16 [phone]

## 2016-09-07 NOTE — Progress Notes (Signed)
Chaplain was making his rounds and visited with pt in [place]. Provided the ministry of prayer and pastoral support.    09/07/16 1105  Clinical Encounter Type  Visited With Patient  Visit Type Initial;Spiritual support  Referral From Nurse  Spiritual Encounters  Spiritual Needs Prayer

## 2016-09-07 NOTE — ED Triage Notes (Signed)
Patient presents to Emergency Department via EMS with complaints of "passing out in front porch of a friend's house" , found diaphoretic and vomited, possible aspiration per EMS.  EMS gave 2.5mg  Narcan intranasal and 2 mg narcan IV this appeared to revived pt.    Pt arrived conscious with nasal trumpet.

## 2016-09-07 NOTE — H&P (Signed)
Luke [name] is an 35 y.o. male.   Chief Complaint: Loss of consciousness HPI: The patient with no past medical history presents emergency department via EMS after being found unconscious on a friend's porch. He was given Narcan which arouse him minimally. In the emergency department he was found to be hypoxic to the low 80s on room air and eventually required nonrebreather to maintain oxygen saturations greater than 94%. Chest x-ray was concerning for pneumonia. After utilization the emergency department staff called the hospitalist service for admission.  Past Medical History:  Diagnosis Date  . Known health problems: none     Past Surgical History:  Procedure Laterality Date  . none      Family History  Problem Relation Age of Onset  . CAD Maternal Grandmother    Social History:  reports that he has been smoking Cigarettes.  He has been smoking about 0.50 packs per day. He has never used smokeless tobacco. He reports that he drinks alcohol. He reports that he uses drugs, including Marijuana.  Allergies: No Known Allergies  Prior to Admission medications   Medication Sig Start Date End Date Taking? Authorizing Provider  HYDROcodone-acetaminophen (NORCO/VICODIN) 5-325 MG tablet Take 1 tablet by mouth every 6 (six) hours as needed for moderate pain. Patient not taking: Reported on 09/07/2016 08/18/15   Lisa Roca, MD  ibuprofen (ADVIL,MOTRIN) 800 MG tablet Take 1 tablet (800 mg total) by mouth every 8 (eight) hours as needed. Patient not taking: Reported on 09/07/2016 08/18/15   Lisa Roca, MD  tamsulosin (FLOMAX) 0.4 MG CAPS capsule Take 1 capsule (0.4 mg total) by mouth daily. Patient not taking: Reported on 09/07/2016 08/18/15   Lisa Roca, MD     Results for orders placed or performed during the hospital encounter of 09/07/16 (from the past 48 hour(s))  CBC with Differential     Status: Abnormal   Collection Time: 09/07/16 12:53 AM  Result Value Ref Range   WBC 10.0 3.8  - 10.6 K/uL   RBC 5.53 4.40 - 5.90 MIL/uL   Hemoglobin 10.5 (L) 13.0 - 18.0 g/dL    Comment: RESULT REPEATED AND VERIFIED   HCT 36.9 (L) 40.0 - 52.0 %   MCV 66.8 (L) 80.0 - 100.0 fL   MCH 19.1 (L) 26.0 - 34.0 pg   MCHC 28.5 (L) 32.0 - 36.0 g/dL   RDW 19.1 (H) 11.5 - 14.5 %   Platelets 264 150 - 440 K/uL   Neutrophils Relative % 44 %   Neutro Abs 4.4 1.4 - 6.5 K/uL   Lymphocytes Relative 47 %   Lymphs Abs 4.7 (H) 1.0 - 3.6 K/uL   Monocytes Relative 7 %   Monocytes Absolute 0.7 0.2 - 1.0 K/uL   Eosinophils Relative 1 %   Eosinophils Absolute 0.1 0 - 0.7 K/uL   Basophils Relative 1 %   Basophils Absolute 0.1 0 - 0.1 K/uL  Comprehensive metabolic panel     Status: Abnormal   Collection Time: 09/07/16 12:53 AM  Result Value Ref Range   Sodium 137 135 - 145 mmol/L   Potassium 3.1 (L) 3.5 - 5.1 mmol/L   Chloride 105 101 - 111 mmol/L   CO2 16 (L) 22 - 32 mmol/L   Glucose, Bld 290 (H) 65 - 99 mg/dL   BUN 15 6 - 20 mg/dL   Creatinine, Ser 1.94 (H) 0.61 - 1.24 mg/dL   Calcium 8.4 (L) 8.9 - 10.3 mg/dL   Total Protein 7.9 6.5 - 8.1 g/dL  Albumin 4.1 3.5 - 5.0 g/dL   AST 24 15 - 41 U/L   ALT 16 (L) 17 - 63 U/L   Alkaline Phosphatase 54 38 - 126 U/L   Total Bilirubin 0.2 (L) 0.3 - 1.2 mg/dL   GFR calc non Af Amer 43 (L) >60 mL/min   GFR calc Af Amer 50 (L) >60 mL/min    Comment: (NOTE) The eGFR has been calculated using the CKD EPI equation. This calculation has not been validated in all clinical situations. eGFR's persistently <60 mL/min signify possible Chronic Kidney Disease.    Anion gap 16 (H) 5 - 15  Ethanol     Status: Abnormal   Collection Time: 09/07/16 12:53 AM  Result Value Ref Range   Alcohol, Ethyl (B) 149 (H) <5 mg/dL    Comment:        LOWEST DETECTABLE LIMIT FOR SERUM ALCOHOL IS 5 mg/dL FOR MEDICAL PURPOSES ONLY   Acetaminophen level     Status: Abnormal   Collection Time: 09/07/16 12:53 AM  Result Value Ref Range   Acetaminophen (Tylenol), Serum <10 (L) 10  - 30 ug/mL    Comment:        THERAPEUTIC CONCENTRATIONS VARY SIGNIFICANTLY. A RANGE OF 10-30 ug/mL MAY BE AN EFFECTIVE CONCENTRATION FOR MANY PATIENTS. HOWEVER, SOME ARE BEST TREATED AT CONCENTRATIONS OUTSIDE THIS RANGE. ACETAMINOPHEN CONCENTRATIONS >150 ug/mL AT 4 HOURS AFTER INGESTION AND >50 ug/mL AT 12 HOURS AFTER INGESTION ARE OFTEN ASSOCIATED WITH TOXIC REACTIONS.   Salicylate level     Status: None   Collection Time: 09/07/16 12:53 AM  Result Value Ref Range   Salicylate Lvl <2.6 2.8 - 30.0 mg/dL  Blood gas, arterial     Status: Abnormal   Collection Time: 09/07/16 12:54 AM  Result Value Ref Range   FIO2 1.00    Delivery systems NON-REBREATHER OXYGEN MASK    pH, Arterial 7.17 (LL) 7.350 - 7.450    Comment: CRITICAL RESULT, NOTIFIED PHYSICIAN  DR. J. SUNG 09/07/2016 0113 KRW    pCO2 arterial 44 32.0 - 48.0 mmHg   pO2, Arterial 136 (H) 83.0 - 108.0 mmHg   Bicarbonate 16.0 (L) 20.0 - 28.0 mmol/L   Acid-base deficit 12.0 (H) 0.0 - 2.0 mmol/L   O2 Saturation 98.3 %   Patient temperature 37.0    Collection site LEFT RADIAL    Sample type ARTERIAL DRAW    Allens test (pass/fail) PASS PASS   Ct Head Wo Contrast  Result Date: 09/07/2016 CLINICAL DATA:  Patient passed out on the front portion of the friend's house, found diaphoretic and vomited. Unresponsive. Hypoxia. EXAM: CT HEAD WITHOUT CONTRAST TECHNIQUE: Contiguous axial images were obtained from the base of the skull through the vertex without intravenous contrast. COMPARISON:  None. FINDINGS: Brain: No evidence of acute infarction, hemorrhage, hydrocephalus, extra-axial collection or mass lesion/mass effect. Vascular: No hyperdense vessel or unexpected calcification. Skull: Normal. Negative for fracture or focal lesion. Sinuses/Orbits: No acute finding. Other: None. IMPRESSION: No acute intracranial abnormalities. Electronically Signed   By: Lucienne Capers M.D.   On: 09/07/2016 01:22   Dg Chest Port 1 View  Result  Date: 09/07/2016 CLINICAL DATA:  Patient passed out. Found diaphoretic and vomiting. Possible aspiration. Unresponsive and hypoxic. EXAM: PORTABLE CHEST 1 VIEW COMPARISON:  None. FINDINGS: Shallow inspiration with atelectasis or infiltration in both lung bases. Appearance is more likely to represent atelectasis but can't completely exclude aspiration. Follow-up as clinically indicated. Heart size and pulmonary vascularity are normal. No blunting of  costophrenic angles. No pneumothorax. Mediastinal contours appear intact. IMPRESSION: Shallow inspiration with atelectasis or infiltration in both lung bases. Electronically Signed   By: Lucienne Capers M.D.   On: 09/07/2016 01:18    Review of Systems  Constitutional: Negative for chills and fever.  HENT: Negative for sore throat and tinnitus.   Eyes: Negative for blurred vision and redness.  Respiratory: Positive for shortness of breath. Negative for cough.   Cardiovascular: Negative for chest pain, palpitations, orthopnea and PND.  Gastrointestinal: Negative for abdominal pain, diarrhea, nausea and vomiting.  Genitourinary: Negative for dysuria, frequency and urgency.  Musculoskeletal: Negative for joint pain and myalgias.  Skin: Negative for rash.       No lesions  Neurological: Negative for speech change, focal weakness and weakness.  Endo/Heme/Allergies: Does not bruise/bleed easily.       No temperature intolerance  Psychiatric/Behavioral: Negative for depression and suicidal ideas.    Blood pressure 118/82, pulse 81, temperature 98.2 F (36.8 C), temperature source Oral, resp. rate (!) 24, height _0  (1.778 m), weight 106.6 kg (235 lb), SpO2 100 %. Physical Exam  Constitutional: He is oriented to person, place, and time. He appears well-developed and well-nourished. No distress.  HENT:  Head: Normocephalic and atraumatic.  Mouth/Throat: Oropharynx is clear and moist.  Eyes: Conjunctivae and EOM are normal. Pupils are equal, round, and  reactive to light. No scleral icterus.  Neck: Normal range of motion. Neck supple. No JVD present. No tracheal deviation present. No thyromegaly present.  Cardiovascular: Normal rate, regular rhythm and normal heart sounds.  Exam reveals no gallop and no friction rub.   No murmur heard. Respiratory: Tachypnea noted. He is in respiratory distress. He has rhonchi in the left lower field.  GI: Soft. Bowel sounds are normal. He exhibits no distension. There is no tenderness.  Genitourinary:  Genitourinary Comments: Deferred  Musculoskeletal: Normal range of motion. He exhibits no edema.  Lymphadenopathy:    He has no cervical adenopathy.  Neurological: He is alert and oriented to person, place, and time. No cranial nerve deficit.  Skin: Skin is warm and dry. No rash noted. No erythema.  Psychiatric: He has a normal mood and affect. His behavior is normal. Judgment and thought content normal.     Assessment/Plan This is a 35 year old male admitted for hypoxia secondary to pneumonia. 1. Pneumonia: Community-acquired. He does not appear to be aspiration pneumonia. The patient received Clinda in the emergency department. He is now awake and protecting his airway. He does not meet criteria for sepsis, thus I will change his antibiotics to oral amoxicillin and azithromycin. Continuous pulse oximetry. Wean oxygen as tolerated. 2. Drug overdose: Urine toxicology positive for cocaine and opiates. Continue to monitor airway. Narcan as needed. 3. Chronic kidney disease: Stage III; avoid nephrotoxic agents. Hydrate with intravenous fluid. 4. DVT prophylaxis: Heparin 5. GI prophylaxis: None The patient is a full code. Time spent on admission orders and patient care approximately 45 minutes  Harrie Foreman, MD 09/07/2016, 4:11 AM

## 2016-09-07 NOTE — ED Notes (Signed)
Patient transported to CT 

## 2016-09-07 NOTE — Progress Notes (Signed)
Admitted this morning because of altered mental status, hypoxia, ammonia, acute renal failure. Seen in the ICU. Labs reviewed,  chest x-ray, reviewed. Continue IV antibiotics for pneumonia, continue oxygen, continue fluids for renal failure. Patient has polysubstance abuse with cocaine, opiates, he told me that he was at a bar. A combination of alcohol, polysubstance abuse made him to have altered mental status. Counselled  use illicit substances.  For hypertension start on Norvasc, transfer the patient to regular floor with telemetry.

## 2016-09-08 LAB — BLOOD GAS, ARTERIAL
ACID-BASE DEFICIT: 0.4 mmol/L (ref 0.0–2.0)
BICARBONATE: 24.1 mmol/L (ref 20.0–28.0)
FIO2: 0.21
O2 Saturation: 91.6 %
PCO2 ART: 38 mmHg (ref 32.0–48.0)
PH ART: 7.41 (ref 7.350–7.450)
PO2 ART: 62 mmHg — AB (ref 83.0–108.0)
Patient temperature: 37

## 2016-09-08 LAB — HEMOGLOBIN A1C
Hgb A1c MFr Bld: 5.8 % — ABNORMAL HIGH (ref 4.8–5.6)
Mean Plasma Glucose: 120 mg/dL

## 2016-09-08 LAB — BASIC METABOLIC PANEL
Anion gap: 5 (ref 5–15)
BUN: 11 mg/dL (ref 6–20)
CHLORIDE: 110 mmol/L (ref 101–111)
CO2: 24 mmol/L (ref 22–32)
CREATININE: 1.28 mg/dL — AB (ref 0.61–1.24)
Calcium: 8.6 mg/dL — ABNORMAL LOW (ref 8.9–10.3)
GFR calc Af Amer: >60 mL/min (ref >60)
GFR calc non Af Amer: >60 mL/min (ref >60)
GLUCOSE: 85 mg/dL (ref 65–99)
POTASSIUM: 4.3 mmol/L (ref 3.5–5.1)
Sodium: 139 mmol/L (ref 135–145)

## 2016-09-08 LAB — GLUCOSE, CAPILLARY
Glucose-Capillary: 97 mg/dL (ref 65–99)
Glucose-Capillary: 85 mg/dL (ref 65–99)
Glucose-Capillary: 92 mg/dL (ref 65–99)

## 2016-09-08 MED ORDER — AMLODIPINE BESYLATE 10 MG PO TABS
10.0000 mg | ORAL_TABLET | Freq: Every day | ORAL | Status: DC
  Administered 2016-09-08: 10 mg via ORAL
  Filled 2016-09-08: qty 1

## 2016-09-08 MED ORDER — AZITHROMYCIN 250 MG PO TABS: ORAL_TABLET | ORAL | 0 refills | Status: DC

## 2016-09-08 MED ORDER — AMLODIPINE BESYLATE 10 MG PO TABS: 10.0000 mg | ORAL_TABLET | Freq: Every day | ORAL | Status: DC

## 2016-09-08 MED ORDER — AMOXICILLIN-POT CLAVULANATE 875-125 MG PO TABS: 1.0000 | ORAL_TABLET | Freq: Two times a day (BID) | ORAL | 0 refills | Status: DC

## 2016-09-08 NOTE — Discharge Summary (Signed)
Luke [name], is a 35 y.o. male  DOB 1981/09/17  MRN [id number].  Admission date:  09/07/2016  Admitting Physician  Harrie Foreman, MD  Discharge Date:  09/08/2016   Primary MD  No PCP Per Patient  Recommendations for primary care physician for things to follow:  No PCP. Case manager help is appreciated.   Admission Diagnosis  Hypoxia [R09.02] Unresponsive XX123456 Alcoholic intoxication without complication (HCC) 123456 Aspiration pneumonia of both lower lobes, unspecified aspiration pneumonia type ([place]) [J69.0]   Discharge Diagnosis  Hypoxia [R09.02] Unresponsive XX123456 Alcoholic intoxication without complication ([place]) 123456 Aspiration pneumonia of both lower lobes, unspecified aspiration pneumonia type ([place]) [J69.0]   Active Problems:   Pneumonia      Past Medical History:  Diagnosis Date  . Known health problems: none     Past Surgical History:  Procedure Laterality Date  . none         History of present illness and  Hospital Course:     Kindly see H&P for history of present illness and admission details, please review complete Labs, Consult reports and Test reports for all details in brief  HPI  from the history and physical done on the day of admission  35 year old male patient admitted on November 16 because of altered mental status, hypoxia, pneumonia. Admitted to intensive care unit.  Hospital Course  #1 altered mental status due to drug overdose: Patient tused cocaine, opiates, alcohol at the same time. And patient not aware of all all this the combination can cause a altered mental status. Denies any suicidal or homicidal ideation. Patient went to a bar yesterday as a routine after the work.. Toxicology positive for opiates opiates and cocaine. Advised To stay away from illicit  substances. He is alert, awake, oriented.  2 hypoxic respiratory failure secondary to pneumonia: She he was given amoxicillin, azithromycin, oxygen. He is saturating 97% on 2 L. Discharge home with azithromycin, Augmentin.  #3/metabolic acidosis: ph 7.1 ,bicarb 16 on admission due to  polysubstance abuse, pneumonia. Patient pH today 7.41,bicab24 today. #4 acute renal failure with ATN due to dehydration improved with hydration. #5 hypertension: New diagnosis. Started on Norvasc 10 mg daily, patient has no primary doctor. Case manager to help with finding PCP.    Discharge Condition: stable   Follow UP      Discharge Instructions  and  Discharge Medications        Medication List    STOP taking these medications   HYDROcodone-acetaminophen 5-325 MG tablet Commonly known as:  NORCO/VICODIN     TAKE these medications   amLODipine 10 MG tablet Commonly known as:  NORVASC Take 1 tablet (10 mg total) by mouth daily. Start taking on:  09/09/2016   amoxicillin-clavulanate 875-125 MG tablet Commonly known as:  AUGMENTIN Take 1 tablet by mouth 2 (two) times daily.   azithromycin 250 MG tablet Commonly known as:  ZITHROMAX Daily for 4 days Start taking on:  09/09/2016   ibuprofen 800 MG tablet Commonly known as:  ADVIL,MOTRIN Take 1 tablet (800 mg total) by mouth every 8 (eight) hours as needed.   tamsulosin 0.4 MG Caps capsule Commonly known as:  FLOMAX Take 1 capsule (0.4 mg total) by mouth daily.         Diet and Activity recommendation: See Discharge Instructions above   Consults obtained - none   Major procedures and Radiology Reports - PLEASE review detailed and final reports for all details, in brief -  Ct Head Wo Contrast  Result Date: 09/07/2016 CLINICAL DATA:  Patient passed out on the front portion of the friend's house, found diaphoretic and vomited. Unresponsive. Hypoxia. EXAM: CT HEAD WITHOUT CONTRAST TECHNIQUE: Contiguous axial images were  obtained from the base of the skull through the vertex without intravenous contrast. COMPARISON:  None. FINDINGS: Brain: No evidence of acute infarction, hemorrhage, hydrocephalus, extra-axial collection or mass lesion/mass effect. Vascular: No hyperdense vessel or unexpected calcification. Skull: Normal. Negative for fracture or focal lesion. Sinuses/Orbits: No acute finding. Other: None. IMPRESSION: No acute intracranial abnormalities. Electronically Signed   By: Lucienne Capers M.D.   On: 09/07/2016 01:22   Dg Chest Port 1 View  Result Date: 09/07/2016 CLINICAL DATA:  Patient passed out. Found diaphoretic and vomiting. Possible aspiration. Unresponsive and hypoxic. EXAM: PORTABLE CHEST 1 VIEW COMPARISON:  None. FINDINGS: Shallow inspiration with atelectasis or infiltration in both lung bases. Appearance is more likely to represent atelectasis but can't completely exclude aspiration. Follow-up as clinically indicated. Heart size and pulmonary vascularity are normal. No blunting of costophrenic angles. No pneumothorax. Mediastinal contours appear intact. IMPRESSION: Shallow inspiration with atelectasis or infiltration in both lung bases. Electronically Signed   By: Lucienne Capers M.D.   On: 09/07/2016 01:18    Micro Results    Recent Results (from the past 240 hour(s))  Culture, blood (routine x 2)     Status: None (Preliminary result)   Collection Time: 09/07/16  3:54 AM  Result Value Ref Range Status   Specimen Description BLOOD L HAND  Final   Special Requests   Final    BOTTLES DRAWN AEROBIC AND ANAEROBIC AER 5ML ANA 4ML   Culture NO GROWTH 1 DAY  Final   Report Status PENDING  Incomplete  Culture, blood (routine x 2)     Status: None (Preliminary result)   Collection Time: 09/07/16  3:54 AM  Result Value Ref Range Status   Specimen Description BLOOD L AC  Final   Special Requests   Final    BOTTLES DRAWN AEROBIC AND ANAEROBIC AER 3ML ANA 4ML   Culture NO GROWTH 1 DAY  Final    Report Status PENDING  Incomplete  MRSA PCR Screening     Status: None   Collection Time: 09/07/16  5:42 AM  Result Value Ref Range Status   MRSA by PCR NEGATIVE NEGATIVE Final    Comment:        The GeneXpert MRSA Assay (FDA approved for NASAL specimens only), is one component of a comprehensive MRSA colonization surveillance program. It is not intended to diagnose MRSA infection nor to guide or monitor treatment for MRSA infections.        Today   Subjective:   Luke [name] today has no headache,no chest abdominal pain,no new weakness tingling or numbness, feels much better wants to go home today.   Objective:   Blood pressure (!) 146/100, pulse 75, temperature 98.6 F (37 C), temperature source Oral, resp. rate 20, height 5\' 8"  (1.727 m), weight 89.9 kg (198 lb 1.6 oz), SpO2 97 %.   Intake/Output Summary (Last 24 hours) at 09/08/16 1016 Last data filed at 09/08/16 0900  Gross per 24 hour  Intake           3257.5 ml  Output              725 ml  Net           2532.5 ml    Exam Awake Alert, Oriented x  3, No new F.N deficits, Normal affect [place].AT,PERRAL Supple Neck,No JVD, No cervical lymphadenopathy appriciated.  Symmetrical Chest wall movement, Good air movement bilaterally, CTAB RRR,No Gallops,Rubs or new Murmurs, No Parasternal Heave +ve B.Sounds, Abd Soft, Non tender, No organomegaly appriciated, No rebound -guarding or rigidity. No Cyanosis, Clubbing or edema, No new Rash or bruise  Data Review   CBC w Diff: Lab Results  Component Value Date   WBC 10.0 09/07/2016   HGB 10.5 (L) 09/07/2016   HGB 9.2 (L) 04/23/2013   HCT 36.9 (L) 09/07/2016   HCT 31.7 (L) 04/23/2013   PLT 264 09/07/2016   PLT 318 04/23/2013   LYMPHOPCT 47 09/07/2016   MONOPCT 7 09/07/2016   EOSPCT 1 09/07/2016   BASOPCT 1 09/07/2016    CMP: Lab Results  Component Value Date   NA 139 09/08/2016   NA 138 04/23/2013   K 4.3 09/08/2016   K 3.6 04/23/2013   CL 110 09/08/2016    CL 103 04/23/2013   CO2 24 09/08/2016   CO2 26 04/23/2013   BUN 11 09/08/2016   BUN 15 04/23/2013   CREATININE 1.28 (H) 09/08/2016   CREATININE 1.78 (H) 04/23/2013   PROT 7.9 09/07/2016   PROT 7.9 04/23/2013   ALBUMIN 4.1 09/07/2016   ALBUMIN 3.8 04/23/2013   BILITOT 0.2 (L) 09/07/2016   BILITOT 0.4 04/23/2013   ALKPHOS 54 09/07/2016   ALKPHOS 69 04/23/2013   AST 24 09/07/2016   AST 13 (L) 04/23/2013   ALT 16 (L) 09/07/2016   ALT 20 04/23/2013  .   Total Time in preparing paper work, data evaluation and todays exam - 67 minutes  Luke [name] M.D on 09/08/2016 at 10:16 AM    Note: This dictation was prepared with Dragon dictation along with smaller phrase technology. Any transcriptional errors that result from this process are unintentional.

## 2016-09-08 NOTE — Progress Notes (Signed)
Patient discharged home. DC instructions provided and explained. Medications reviewed. Rx given. All questions answered. Pt stable at discharge. 

## 2016-09-08 NOTE — Clinical Social Work Note (Signed)
Clinical Social Work Assessment  Patient Details  Name: Luke [name] MRN: [id number] Date of Birth: 02-16-1981  Date of referral:  09/08/16               Reason for consult:  Substance Use/ETOH Abuse                Permission sought to share information with:    Permission granted to share information::     Name::        Agency::     Relationship::     Contact Information:     Housing/Transportation Living arrangements for the past 2 months:  Single Family Home Source of Information:  Patient Patient Interpreter Needed:  None Criminal Activity/Legal Involvement Pertinent to Current Situation/Hospitalization:  No - Comment as needed Significant Relationships:  Significant Other Lives with:  Significant Other Do you feel safe going back to the place where you live?  Yes Need for family participation in patient care:  No (Coment)  Care giving concerns: Patient lives in [place] with [name].    Social Worker assessment / plan:  Holiday representative ([place]) received verbal consult from RN in progression rounds for polysubstance abuse. CSW met with patient and [name] was at bedside. Patient requested for fiance to stay in the room during assessment and said he felt comfortable sharing confidential information with her. CSW introduced self and explained role of CSW department. Patient reported that he has [employer] through the affordable care act and will get his prescriptions at [facility]. Patient reported that his fiance can provide transport. Patient reported that the cocaine, opiates, and alcohol is a rare thing and he does not see it as a problem. CSW provided patient with [facility] substance abuse resources in case he wanted to review it in the future. Patient reported that he does come to [place] more often then [place]. Patient is discharging home today with his fiance. Please reconsult if future social work needs arise. CSW signing off.    Employment status:  [employer] information:  Managed Care PT Recommendations:  Not assessed at this time Information / Referral to community resources:  Outpatient Substance Abuse Treatment Options  Patient/Family's Response to care:  Patient accepted [facility] substance abuse resources.   Patient/Family's Understanding of and Emotional Response to Diagnosis, Current Treatment, and Prognosis:  Patient was pleasant and thanked CSW for visit.   Emotional Assessment Appearance:  Appears stated age Attitude/Demeanor/Rapport:    Affect (typically observed):  Accepting, Adaptable, Pleasant Orientation:  Oriented to Self, Oriented to Place, Oriented to  Time, Oriented to Situation Alcohol / Substance use:  Alcohol Use, Illicit Drugs Psych involvement (Current and /or in the community):  No (Comment)  Discharge Needs  Concerns to be addressed:  Discharge Planning Concerns Readmission within the last 30 days:  No Current discharge risk:  Substance Abuse Barriers to Discharge:  No Barriers Identified   Gissel Keilman, Veronia Beets, LCSW 09/08/2016, 11:59 AM

## 2016-09-12 LAB — CULTURE, BLOOD (ROUTINE X 2)
Culture: NO GROWTH
Culture: NO GROWTH

## 2017-02-09 ENCOUNTER — Encounter: Payer: Self-pay | Admitting: Emergency Medicine

## 2017-02-09 ENCOUNTER — Emergency Department
Admission: EM | Admit: 2017-02-09 | Discharge: 2017-02-09 | Disposition: A | Discharge status: Home / Self Care | Payer: BLUE CROSS/BLUE SHIELD | Attending: Emergency Medicine | Admitting: Emergency Medicine

## 2017-02-09 DIAGNOSIS — K644 Residual hemorrhoidal skin tags: Secondary | ICD-10-CM | POA: Insufficient documentation

## 2017-02-09 DIAGNOSIS — I10 Essential (primary) hypertension: Secondary | ICD-10-CM | POA: Insufficient documentation

## 2017-02-09 HISTORY — DX: Essential (primary) hypertension: I10

## 2017-02-09 LAB — CBC
HCT: 35.3 % — ABNORMAL LOW (ref 40.0–52.0)
HEMOGLOBIN: 10.4 g/dL — AB (ref 13.0–18.0)
MCH: 18.7 pg — AB (ref 26.0–34.0)
MCHC: 29.5 g/dL — ABNORMAL LOW (ref 32.0–36.0)
MCV: 63.4 fL — AB (ref 80.0–100.0)
Platelets: 257 10*3/uL (ref 150–440)
RBC: 5.56 MIL/uL (ref 4.40–5.90)
RDW: 19.4 % — ABNORMAL HIGH (ref 11.5–14.5)
WBC: 5.4 10*3/uL (ref 3.8–10.6)

## 2017-02-09 LAB — COMPREHENSIVE METABOLIC PANEL
ALT: 15 U/L — ABNORMAL LOW (ref 17–63)
ANION GAP: 6 (ref 5–15)
AST: 19 U/L (ref 15–41)
Albumin: 3.9 g/dL (ref 3.5–5.0)
Alkaline Phosphatase: 56 U/L (ref 38–126)
BUN: 9 mg/dL (ref 6–20)
CHLORIDE: 106 mmol/L (ref 101–111)
CO2: 27 mmol/L (ref 22–32)
Calcium: 9 mg/dL (ref 8.9–10.3)
Creatinine, Ser: 1.47 mg/dL — ABNORMAL HIGH (ref 0.61–1.24)
GFR calc non Af Amer: 59 mL/min — ABNORMAL LOW (ref >60)
Glucose, Bld: 122 mg/dL — ABNORMAL HIGH (ref 65–99)
POTASSIUM: 3.8 mmol/L (ref 3.5–5.1)
SODIUM: 139 mmol/L (ref 135–145)
Total Bilirubin: 0.6 mg/dL (ref 0.3–1.2)
Total Protein: 8.1 g/dL (ref 6.5–8.1)

## 2017-02-09 MED ORDER — HYDROCORTISONE 2.5 % RE CREA: 1.0000 "application " | TOPICAL_CREAM | Freq: Two times a day (BID) | RECTAL | 0 refills | Status: DC

## 2017-02-09 MED ORDER — HYDROCORTISONE ACETATE 25 MG RE SUPP: 25.0000 mg | Freq: Two times a day (BID) | RECTAL | 1 refills | Status: AC | PRN

## 2017-02-09 NOTE — ED Triage Notes (Signed)
Says has had hemorrhoids inpast with bleeding, but last Thursday was doing some heavy lifting and bleeding  Is worse.  Trying otc meds

## 2017-02-09 NOTE — ED Provider Notes (Signed)
[facility] Emergency Department Provider Note       Time seen: ----------------------------------------- 12:11 PM on 02/09/2017 -----------------------------------------     I have reviewed the triage vital signs and the nursing notes.   HISTORY   Chief Complaint Hemorrhoids    HPI Luke [name] is a 36 y.o. male who presents to the ED for bleeding hemorrhoids. Patient states he has had hemorrhoids in the past last Thursday was doing some heavy lifting and the bleeding became worse. His been trying over-the-counter medications without improvement. He denies any rectal pain at this time.   Past Medical History:  Diagnosis Date  . Hypertension   . Known health problems: none     Patient Active Problem List   Diagnosis Date Noted  . Pneumonia 09/07/2016    Past Surgical History:  Procedure Laterality Date  . none      Allergies Patient has no known allergies.  Social History Social History  Substance Use Topics  . Smoking status: Never Smoker  . Smokeless tobacco: Never Used  . Alcohol use No     Comment: liquor    Review of Systems Constitutional: Negative for fever. Cardiovascular: Negative for chest pain. Respiratory: Negative for shortness of breath. Gastrointestinal: Negative for abdominal pain, vomiting and diarrhea. Rectal: Positive for hemorrhoid, rectal bleeding Genitourinary: Negative for dysuria. Musculoskeletal: Positive for occasional back pain Skin: Negative for rash. Neurological: Negative for headaches, focal weakness or numbness.  10-point ROS otherwise negative.  ____________________________________________   PHYSICAL EXAM:  VITAL SIGNS: ED Triage Vitals  Enc Vitals Group     BP 02/09/17 1042 114/85     Pulse Rate 02/09/17 1042 80     Resp 02/09/17 1042 16     Temp 02/09/17 1042 98.2 F (36.8 C)     Temp Source 02/09/17 1042 Oral     SpO2 02/09/17 1042 100 %     Weight 02/09/17 1042 198 lb (89.8  kg)     Height 02/09/17 1042 5\' 9"  (1.753 m)     Head Circumference --      Peak Flow --      Pain Score 02/09/17 1041 8     Pain Loc --      Pain Edu? --      Excl. in [place]? --     Constitutional: Alert and oriented. Well appearing and in no distress. Eyes: Conjunctivae are normal. Normal extraocular movements. Cardiovascular: Normal rate, regular rhythm. No murmurs, rubs, or gallops. Respiratory: Normal respiratory effort without tachypnea nor retractions. Breath sounds are clear and equal bilaterally. No wheezes/rales/rhonchi. Gastrointestinal: Soft and nontender. Normal bowel sounds Rectal: Small to medium-sized external hemorrhoid that is nontender, mild bleeding is noted Musculoskeletal: Nontender with normal range of motion in extremities. No lower extremity tenderness nor edema. Negative cross straight leg raise examination Neurologic:  Normal speech and language. No gross focal neurologic deficits are appreciated.  Skin:  Skin is warm, dry and intact. No rash noted. ___________________________________________  ED COURSE:  Pertinent labs & imaging results that were available during my care of the patient were reviewed by me and considered in my medical decision making (see chart for details). Patient presents for rectal bleeding and hemorrhoids, we will assess with labs    Procedures ____________________________________________   LABS (pertinent positives/negatives)  Labs Reviewed  COMPREHENSIVE METABOLIC PANEL - Abnormal; Notable for the following:       Result Value   Glucose, Bld 122 (*)    Creatinine, Ser 1.47 (*)  ALT 15 (*)    GFR calc non Af Amer 59 (*)    All other components within normal limits  CBC - Abnormal; Notable for the following:    Hemoglobin 10.4 (*)    HCT 35.3 (*)    MCV 63.4 (*)    MCH 18.7 (*)    MCHC 29.5 (*)    RDW 19.4 (*)    All other components within normal limits  POC OCCULT BLOOD, ED    ____________________________________________  FINAL ASSESSMENT AND PLAN  Bleeding hemorrhoid  Plan: Patient's labs were dictated above. Patient had presented for a bleeding external hemorrhoid. He is not complaining of any pain, it was nontender to touch. I suspect this is not a thrombosed hemorrhoid. He'll be started on Anusol suppositories and cream. He is stable for discharge.   Earleen Newport, MD   Note: This note was generated in part or whole with voice recognition software. Voice recognition is usually quite accurate but there are transcription errors that can and very often do occur. I apologize for any typographical errors that were not detected and corrected.     Earleen Newport, MD 02/09/17 1213

## 2018-07-03 ENCOUNTER — Other Ambulatory Visit: Payer: Self-pay

## 2018-07-03 ENCOUNTER — Inpatient Hospital Stay
Admission: EM | Admit: 2018-07-03 | Discharge: 2018-07-05 | DRG: 378 | Disposition: A | Discharge status: Home / Self Care | Payer: BLUE CROSS/BLUE SHIELD | Attending: Internal Medicine | Admitting: Internal Medicine

## 2018-07-03 ENCOUNTER — Emergency Department: Payer: BLUE CROSS/BLUE SHIELD

## 2018-07-03 DIAGNOSIS — T39395A Adverse effect of other nonsteroidal anti-inflammatory drugs [NSAID], initial encounter: Secondary | ICD-10-CM | POA: Diagnosis present

## 2018-07-03 DIAGNOSIS — N179 Acute kidney failure, unspecified: Secondary | ICD-10-CM | POA: Diagnosis present

## 2018-07-03 DIAGNOSIS — Z79899 Other long term (current) drug therapy: Secondary | ICD-10-CM

## 2018-07-03 DIAGNOSIS — R6884 Jaw pain: Secondary | ICD-10-CM | POA: Diagnosis present

## 2018-07-03 DIAGNOSIS — D649 Anemia, unspecified: Secondary | ICD-10-CM

## 2018-07-03 DIAGNOSIS — R7989 Other specified abnormal findings of blood chemistry: Secondary | ICD-10-CM | POA: Diagnosis present

## 2018-07-03 DIAGNOSIS — E861 Hypovolemia: Secondary | ICD-10-CM | POA: Diagnosis present

## 2018-07-03 DIAGNOSIS — R51 Headache: Secondary | ICD-10-CM | POA: Diagnosis present

## 2018-07-03 DIAGNOSIS — I129 Hypertensive chronic kidney disease with stage 1 through stage 4 chronic kidney disease, or unspecified chronic kidney disease: Secondary | ICD-10-CM | POA: Diagnosis present

## 2018-07-03 DIAGNOSIS — D500 Iron deficiency anemia secondary to blood loss (chronic): Secondary | ICD-10-CM | POA: Diagnosis present

## 2018-07-03 DIAGNOSIS — N183 Chronic kidney disease, stage 3 (moderate): Secondary | ICD-10-CM | POA: Diagnosis present

## 2018-07-03 DIAGNOSIS — K644 Residual hemorrhoidal skin tags: Secondary | ICD-10-CM | POA: Diagnosis present

## 2018-07-03 DIAGNOSIS — R0789 Other chest pain: Secondary | ICD-10-CM | POA: Diagnosis present

## 2018-07-03 DIAGNOSIS — Z792 Long term (current) use of antibiotics: Secondary | ICD-10-CM

## 2018-07-03 DIAGNOSIS — K922 Gastrointestinal hemorrhage, unspecified: Principal | ICD-10-CM | POA: Diagnosis present

## 2018-07-03 LAB — CBC
HEMATOCRIT: 28.5 % — AB (ref 40.0–52.0)
Hemoglobin: 8.1 g/dL — ABNORMAL LOW (ref 13.0–18.0)
MCH: 16.5 pg — ABNORMAL LOW (ref 26.0–34.0)
MCHC: 28.3 g/dL — AB (ref 32.0–36.0)
MCV: 58.4 fL — AB (ref 80.0–100.0)
PLATELETS: 172 10*3/uL (ref 150–440)
RBC: 4.88 MIL/uL (ref 4.40–5.90)
RDW: 21.9 % — AB (ref 11.5–14.5)
WBC: 7.5 10*3/uL (ref 3.8–10.6)

## 2018-07-03 LAB — TROPONIN I
Troponin I: <0.03 ng/mL (ref <0.03)
Troponin I: <0.03 ng/mL (ref <0.03)

## 2018-07-03 LAB — BASIC METABOLIC PANEL
Anion gap: 9 (ref 5–15)
BUN: 11 mg/dL (ref 6–20)
CO2: 26 mmol/L (ref 22–32)
CREATININE: 1.66 mg/dL — AB (ref 0.61–1.24)
Calcium: 8.9 mg/dL (ref 8.9–10.3)
Chloride: 105 mmol/L (ref 98–111)
GFR calc Af Amer: 59 mL/min — ABNORMAL LOW (ref >60)
GFR calc non Af Amer: 51 mL/min — ABNORMAL LOW (ref >60)
GLUCOSE: 87 mg/dL (ref 70–99)
POTASSIUM: 3.9 mmol/L (ref 3.5–5.1)
SODIUM: 140 mmol/L (ref 135–145)

## 2018-07-03 LAB — FERRITIN: FERRITIN: 2 ng/mL — AB (ref 24–336)

## 2018-07-03 LAB — IRON AND TIBC
IRON: 18 ug/dL — AB (ref 45–182)
SATURATION RATIOS: 4 % — AB (ref 17.9–39.5)
TIBC: 441 ug/dL (ref 250–450)
UIBC: 423 ug/dL

## 2018-07-03 LAB — FOLATE: FOLATE: 9.1 ng/mL (ref >5.9)

## 2018-07-03 MED ORDER — SODIUM CHLORIDE 0.9 % IV SOLN
200.0000 mg | Freq: Once | INTRAVENOUS | Status: AC
  Administered 2018-07-04: 200 mg via INTRAVENOUS
  Filled 2018-07-03: qty 10

## 2018-07-03 NOTE — ED Provider Notes (Signed)
[facility] Emergency Department Provider Note ____________________________________________   First MD Initiated Contact with Patient 07/03/18 1939     (approximate)  I have reviewed the triage vital signs and the nursing notes.   HISTORY  Chief Complaint Chest Pain  HPI Nature DAANISH COPES is a 37 y.o. male with a history of hypertension who is presenting to the emergency department today with left-sided chest pain over the past month.  He says that the pain is a pressure-like pain which radiates to though his left side of the jaw as well as the left side of his head.  He denies shortness of breath but says the pain gets worse with exertion and is associated with nausea.  He says that he is a history of cardiac disease in his family but he has never had cardiac problems himself.  Does not smoke, drink or use any drugs.  Denies any chest pain at this time.  Says that he also sometimes has blood in his stool.  Past Medical History:  Diagnosis Date  . Hypertension   . Known health problems: none     Patient Active Problem List   Diagnosis Date Noted  . Pneumonia 09/07/2016    Past Surgical History:  Procedure Laterality Date  . none      Prior to Admission medications   Medication Sig Start Date End Date Taking? Authorizing Provider  amLODipine (NORVASC) 10 MG tablet Take 1 tablet (10 mg total) by mouth daily. 09/09/16   Epifanio Lesches, MD  amoxicillin-clavulanate (AUGMENTIN) 875-125 MG tablet Take 1 tablet by mouth 2 (two) times daily. 09/08/16   Epifanio Lesches, MD  azithromycin (ZITHROMAX) 250 MG tablet Daily for 4 days 09/09/16   Epifanio Lesches, MD  hydrocortisone (ANUSOL-HC) 2.5 % rectal cream Place 1 application rectally 2 (two) times daily. 02/09/17   Earleen Newport, MD  ibuprofen (ADVIL,MOTRIN) 800 MG tablet Take 1 tablet (800 mg total) by mouth every 8 (eight) hours as needed. Patient not taking: Reported on 09/07/2016 08/18/15    Lisa Roca, MD  tamsulosin (FLOMAX) 0.4 MG CAPS capsule Take 1 capsule (0.4 mg total) by mouth daily. Patient not taking: Reported on 09/07/2016 08/18/15   Lisa Roca, MD    Allergies Patient has no known allergies.  Family History  Problem Relation Age of Onset  . CAD Maternal Grandmother     Social History Social History   Tobacco Use  . Smoking status: Never Smoker  . Smokeless tobacco: Never Used  Substance Use Topics  . Alcohol use: No    Comment: liquor  . Drug use: Yes    Types: Marijuana    Review of Systems  Constitutional: No fever/chills Eyes: No visual changes. ENT: No sore throat. Cardiovascular: As above Respiratory: Denies shortness of breath. Gastrointestinal: No abdominal pain.   no vomiting.  No diarrhea.  No constipation. Genitourinary: Negative for dysuria. Musculoskeletal: Negative for back pain. Skin: Negative for rash. Neurological: Negative for focal weakness or numbness.   ____________________________________________   PHYSICAL EXAM:  VITAL SIGNS: ED Triage Vitals  Enc Vitals Group     BP 07/03/18 1739 (!) 148/97     Pulse Rate 07/03/18 1739 62     Resp 07/03/18 1739 18     Temp 07/03/18 1739 98.1 F (36.7 C)     Temp Source 07/03/18 1739 Oral     SpO2 07/03/18 1739 100 %     Weight 07/03/18 1737 190 lb (86.2 kg)  Height 07/03/18 1737 5\' 8"  (1.727 m)     Head Circumference --      Peak Flow --      Pain Score 07/03/18 1737 8     Pain Loc --      Pain Edu? --      Excl. in [place]? --     Constitutional: Alert and oriented. Well appearing and in no acute distress. Eyes: Conjunctivae are normal.  Head: Atraumatic. Nose: No congestion/rhinnorhea. Mouth/Throat: Mucous membranes are moist.  Neck: No stridor.   Cardiovascular: Normal rate, regular rhythm. Grossly normal heart sounds.  Good peripheral circulation with equal bilateral radial as well as dorsalis pedis pulses. Respiratory: Normal respiratory effort.  No  retractions. Lungs CTAB. Gastrointestinal: Soft and nontender. No distention.  Normal external exam without any hemorrhoids.  Heme positive brown stool on rectal exam. Musculoskeletal: No lower extremity tenderness nor edema.  No joint effusions. Neurologic:  Normal speech and language. No gross focal neurologic deficits are appreciated. Skin:  Skin is warm, dry and intact. No rash noted. Psychiatric: Mood and affect are normal. Speech and behavior are normal.  ____________________________________________   LABS (all labs ordered are listed, but only abnormal results are displayed)  Labs Reviewed  BASIC METABOLIC PANEL - Abnormal; Notable for the following components:      Result Value   Creatinine, Ser 1.66 (*)    GFR calc non Af Amer 51 (*)    GFR calc Af Amer 59 (*)    All other components within normal limits  CBC - Abnormal; Notable for the following components:   Hemoglobin 8.1 (*)    HCT 28.5 (*)    MCV 58.4 (*)    MCH 16.5 (*)    MCHC 28.3 (*)    RDW 21.9 (*)    All other components within normal limits  TROPONIN I   ____________________________________________  EKG  ED ECG REPORT I, Doran Stabler, the attending physician, personally viewed and interpreted this ECG.   Date: 07/03/2018  EKG Time: 1734  Rate: 61  Rhythm: normal sinus rhythm  Axis: Normal  Intervals:none  ST&T Change: No ST segment elevation or depression.  No abnormal T wave inversion.  ____________________________________________  RADIOLOGY  No acute abnormality on the chest x-ray ____________________________________________   PROCEDURES  Procedure(s) performed:   Procedures  Critical Care performed:   ____________________________________________   INITIAL IMPRESSION / ASSESSMENT AND PLAN / ED COURSE  Pertinent labs & imaging results that were available during my care of the patient were reviewed by me and considered in my medical decision making (see chart for  details).  Differential diagnosis includes, but is not limited to, ACS, aortic dissection, pulmonary embolism, cardiac tamponade, pneumothorax, pneumonia, pericarditis, myocarditis, GI-related causes including esophagitis/gastritis, and musculoskeletal chest wall pain.   As part of my medical decision making, I reviewed the following data within the electronic MEDICAL RECORD NUMBER Notes from prior ED visits  ----------------------------------------- 8:19 PM on 07/03/2018 -----------------------------------------  Patient with heme positive stool.  Drop in his hemoglobin over the past year from greater than 10-8.1 at this time.  Likely symptomatic anemia.  Possibly microcytic, extrapolating from the CBC values.  Patient to be admitted to the hospital.  He is understanding of the treatment plan willing to comply.  Signed out to the hospitalist. ____________________________________________   FINAL CLINICAL IMPRESSION(S) / ED DIAGNOSES  Symptomatic anemia.    NEW MEDICATIONS STARTED DURING THIS VISIT:  New Prescriptions   No medications on file  Note:  This document was prepared using Dragon voice recognition software and may include unintentional dictation errors.     Orbie Pyo, MD 07/03/18 2020

## 2018-07-03 NOTE — ED Notes (Signed)
Amanda NT to bedside for rounding and to place patient on cardiac monitor.

## 2018-07-03 NOTE — ED Notes (Signed)
Patient left chest pain, denies radiation, beginning this am. Patient reports accompanying symptoms of SOB, dizziness, nausea, and near syncope. Patient describes pain as throbbing and rates at 7 of 10. Patient reports hx of hypertension; further reports that he has been on amlodipine in the past, however not currently taking as his PCP did not refill his prescription.

## 2018-07-03 NOTE — ED Notes (Signed)
Called floor to let them know patient on the way. Spoke with [employer] RN

## 2018-07-03 NOTE — ED Triage Notes (Signed)
Pt c/o of L sided CP that began today. No radiation. States HA as well. Alert, oriented, family with pt.

## 2018-07-03 NOTE — H&P (Signed)
[facility] [place] NAME: Luke [name]    MR#:  [id number]  DATE OF BIRTH:  09/08/1981  DATE OF ADMISSION:  07/03/2018  PRIMARY CARE PHYSICIAN: Patient, No Pcp Per   REQUESTING/REFERRING PHYSICIAN:   CHIEF COMPLAINT:   Chief Complaint  Patient presents with  . Chest Pain    HISTORY OF PRESENT ILLNESS: Luke [name]  is a 37 y.o. male with a known history of hypertension, cocaine use and hemorrhoids. Per records review, patient can use illicit drugs including cocaine in the past.  However, at this time he denies using any tobacco or illicit drugs.  He does admit to drinking alcohol on occasion. He came to the hospital for generalized weakness and fatigue, lightheadedness, headache and chest pain, going on for the past few days, gradually getting worse.  The chest pain is described as intermittent 4/10 retrosternal pressure, radiating to the left side of the head, associated with nausea and getting worse with exertion.  Patient has been having severe headaches for the past month and he has been taking Aleve frequently, to help with her headaches.  On detailed questioning, patient admits to seeing bright red blood in stool several times in the past few months.  Intermittent, mild lower abdominal pain is present as well. No fever, chills, no urinary symptoms, no focal weakness, no vision changes. Blood test done emergency room revealed low hemoglobin level at 8.1.  Creatinine is 1.66.  Iron level is low at 18.  Routine level is low at 2.  Troponin level is less than 0.03.  Urine drug screen is pending. EKG shows normal sinus rhythm, with heart rate at 61, no acute ischemic changes. Patient is admitted for further evaluation and treatment.   PAST MEDICAL HISTORY:   Past Medical History:  Diagnosis Date  . Hypertension   . Known health problems: none     PAST SURGICAL HISTORY:  Past Surgical History:  Procedure Laterality Date  . none       SOCIAL HISTORY:  Social History   Tobacco Use  . Smoking status: Never Smoker  . Smokeless tobacco: Never Used  Substance Use Topics  . Alcohol use: No    Comment: liquor    FAMILY HISTORY:  Family History  Problem Relation Age of Onset  . CAD Maternal Grandmother     DRUG ALLERGIES: No Known Allergies  REVIEW OF SYSTEMS:   CONSTITUTIONAL: No fever, positive for fatigue and general weakness.  EYES: No changes in vision.  EARS, NOSE, AND THROAT: No tinnitus or ear pain.  RESPIRATORY: No cough, shortness of breath, wheezing or hemoptysis.  CARDIOVASCULAR: Positive for chest pain, orthopnea, edema.  GASTROINTESTINAL: Positive for blood in stool.  Positive for nausea and lower abdominal pain.  No vomiting, diarrhea. GENITOURINARY: No dysuria, hematuria.  ENDOCRINE: No polyuria, nocturia. HEMATOLOGY: Positive for blood in stool. SKIN: No rash or lesion. MUSCULOSKELETAL: No joint pain at this time.   NEUROLOGIC: No focal weakness.  PSYCHIATRY: No anxiety or depression.   MEDICATIONS AT HOME:  Prior to Admission medications   Medication Sig Start Date End Date Taking? Authorizing Provider  amLODipine (NORVASC) 10 MG tablet Take 1 tablet (10 mg total) by mouth daily. 09/09/16   Epifanio Lesches, MD  amoxicillin-clavulanate (AUGMENTIN) 875-125 MG tablet Take 1 tablet by mouth 2 (two) times daily. 09/08/16   Epifanio Lesches, MD  azithromycin (ZITHROMAX) 250 MG tablet Daily for 4 days 09/09/16   Epifanio Lesches, MD  hydrocortisone (  ANUSOL-HC) 2.5 % rectal cream Place 1 application rectally 2 (two) times daily. 02/09/17   Earleen Newport, MD  ibuprofen (ADVIL,MOTRIN) 800 MG tablet Take 1 tablet (800 mg total) by mouth every 8 (eight) hours as needed. Patient not taking: Reported on 09/07/2016 08/18/15   Lisa Roca, MD  tamsulosin (FLOMAX) 0.4 MG CAPS capsule Take 1 capsule (0.4 mg total) by mouth daily. Patient not taking: Reported on 09/07/2016 08/18/15   Lisa Roca, MD      PHYSICAL EXAMINATION:   VITAL SIGNS: Blood pressure (!) 139/100, pulse (!) 57, temperature 98.1 F (36.7 C), temperature source Oral, resp. rate 19, height 5\' 8"  (1.727 m), weight 86.2 kg, SpO2 100 %.  GENERAL:  37 y.o.-year-old patient lying in the bed with no acute distress.  EYES: Pupils equal, round, reactive to light and accommodation. No scleral icterus. Extraocular muscles intact.  HEENT: Head atraumatic, normocephalic. Oropharynx and nasopharynx clear.  NECK:  Supple, no jugular venous distention. No thyroid enlargement, no tenderness.  LUNGS: Normal breath sounds bilaterally, no wheezing, rales,rhonchi or crepitation. No use of accessory muscles of respiration.  CARDIOVASCULAR: S1, S2 normal. No S3/S4.  ABDOMEN: Mild tenderness with palpation lower abdominal area. Otherwise, the abdomen is soft, nondistended. Bowel sounds present. No organomegaly or mass.  EXTREMITIES: No pedal edema, cyanosis, or clubbing.  NEUROLOGIC: Cranial nerves II through XII are intact. Muscle strength 5/5 in all extremities. Sensation intact.   PSYCHIATRIC: The patient is alert and oriented x 3.  SKIN: No obvious rash, lesion, or ulcer.   LABORATORY PANEL:   CBC Recent Labs  Lab 07/03/18 1739  WBC 7.5  HGB 8.1*  HCT 28.5*  PLT 172  MCV 58.4*  MCH 16.5*  MCHC 28.3*  RDW 21.9*   ------------------------------------------------------------------------------------------------------------------  Chemistries  Recent Labs  Lab 07/03/18 1739  NA 140  K 3.9  CL 105  CO2 26  GLUCOSE 87  BUN 11  CREATININE 1.66*  CALCIUM 8.9   ------------------------------------------------------------------------------------------------------------------ estimated creatinine clearance is 64.4 mL/min (A) (by C-G formula based on SCr of 1.66 mg/dL (H)). ------------------------------------------------------------------------------------------------------------------ No results for  input(s): TSH, T4TOTAL, T3FREE, THYROIDAB in the last 72 hours.  Invalid input(s): FREET3   Coagulation profile No results for input(s): INR, PROTIME in the last 168 hours. ------------------------------------------------------------------------------------------------------------------- No results for input(s): DDIMER in the last 72 hours. -------------------------------------------------------------------------------------------------------------------  Cardiac Enzymes Recent Labs  Lab 07/03/18 1739 07/03/18 2047  TROPONINI <0.03 <0.03   ------------------------------------------------------------------------------------------------------------------ Invalid input(s): POCBNP  ---------------------------------------------------------------------------------------------------------------  Urinalysis    Component Value Date/Time   COLORURINE YELLOW (A) 08/18/2015 1505   APPEARANCEUR CLEAR (A) 08/18/2015 1505   APPEARANCEUR Clear 04/23/2013 2126   LABSPEC 1.027 08/18/2015 1505   LABSPEC 1.023 04/23/2013 2126   PHURINE 5.0 08/18/2015 1505   GLUCOSEU NEGATIVE 08/18/2015 1505   GLUCOSEU Negative 04/23/2013 2126   HGBUR NEGATIVE 08/18/2015 1505   BILIRUBINUR NEGATIVE 08/18/2015 1505   BILIRUBINUR Negative 04/23/2013 2126   KETONESUR NEGATIVE 08/18/2015 1505   PROTEINUR NEGATIVE 08/18/2015 1505   NITRITE NEGATIVE 08/18/2015 1505   LEUKOCYTESUR NEGATIVE 08/18/2015 1505   LEUKOCYTESUR Negative 04/23/2013 2126     RADIOLOGY: Dg Chest 2 View  Result Date: 07/03/2018 CLINICAL DATA:  Patient with chest pain. EXAM: CHEST - 2 VIEW COMPARISON:  Chest radiograph 09/07/2016 FINDINGS: Stable cardiac and mediastinal contours. No consolidative pulmonary opacities. No pleural effusion or pneumothorax. Regional skeleton is unremarkable. IMPRESSION: No acute cardiopulmonary process. Electronically Signed   By: Lovey Newcomer M.D.   On: 07/03/2018 18:16  EKG: Orders placed or performed  during the hospital encounter of 07/03/18  . EKG 12-Lead  . EKG 12-Lead  . ED EKG within 10 minutes  . ED EKG within 10 minutes    IMPRESSION AND PLAN:  1.  Microcytic anemia, acute on chronic.  Hemoglobin level is low at 8.1 today, it dropped from 10.4 last year.  Likely secondary to iron deficiency from GI bleed.  Will start IV fluids and iron IV.  Will consult gastroenterology for further evaluation and treatment. 2.  GI bleed.  We will start patient on PPI treatment.  He is advised to avoid aspirin and NSAIDs.  Gastroenterology is consulted for further evaluation and treatment. 3.  Chest pain, could be related to anemia.  We will continue to monitor patient on telemetry and follow troponin levels to rule out ACS.  Will check 2D echo.  Urine drug screen is pending. 4.  Hypertension, controlled on Norvasc. 5.  ARF, could be related to hypovolemia and or NSAIDs use.  We will start IV fluids and monitor kidney function closely.  Avoid nephrotoxic medications, and advised to avoid NSAIDs use.  All the records are reviewed and case discussed with ED provider. Management plans discussed with the patient, family and they are in agreement.  CODE STATUS: Full Code Status History    Date Active Date Inactive Code Status Order ID Comments User Context   09/07/2016 0531 09/08/2016 1709 Full Code [id number]  Harrie Foreman, MD Inpatient       TOTAL TIME TAKING CARE OF THIS PATIENT: 45 minutes.    Amelia Jo M.D on 07/03/2018 at 9:55 PM  Between 7am to 6pm - Pager - [phone]  After 6pm go to [email or web address] - password [email or web address] [facility] [place] [facility]  [phone]  CC: Primary care physician; Patient, No Pcp Per

## 2018-07-03 NOTE — ED Notes (Signed)
Admitting MD at bedside.

## 2018-07-04 ENCOUNTER — Other Ambulatory Visit: Payer: Self-pay

## 2018-07-04 ENCOUNTER — Encounter: Payer: Self-pay | Admitting: Emergency Medicine

## 2018-07-04 DIAGNOSIS — D509 Iron deficiency anemia, unspecified: Secondary | ICD-10-CM

## 2018-07-04 LAB — BASIC METABOLIC PANEL
Anion gap: 5 (ref 5–15)
BUN: 10 mg/dL (ref 6–20)
CHLORIDE: 107 mmol/L (ref 98–111)
CO2: 26 mmol/L (ref 22–32)
Calcium: 8.4 mg/dL — ABNORMAL LOW (ref 8.9–10.3)
Creatinine, Ser: 1.47 mg/dL — ABNORMAL HIGH (ref 0.61–1.24)
GFR calc Af Amer: >60 mL/min (ref >60)
GFR calc non Af Amer: 59 mL/min — ABNORMAL LOW (ref >60)
GLUCOSE: 87 mg/dL (ref 70–99)
POTASSIUM: 3.4 mmol/L — AB (ref 3.5–5.1)
Sodium: 138 mmol/L (ref 135–145)

## 2018-07-04 LAB — CBC
HEMATOCRIT: 26.8 % — AB (ref 40.0–52.0)
Hemoglobin: 7.6 g/dL — ABNORMAL LOW (ref 13.0–18.0)
MCH: 16.6 pg — AB (ref 26.0–34.0)
MCHC: 28.3 g/dL — AB (ref 32.0–36.0)
MCV: 58.6 fL — AB (ref 80.0–100.0)
Platelets: 159 10*3/uL (ref 150–440)
RBC: 4.57 MIL/uL (ref 4.40–5.90)
RDW: 21.6 % — AB (ref 11.5–14.5)
WBC: 6 10*3/uL (ref 3.8–10.6)

## 2018-07-04 LAB — URINE DRUG SCREEN, QUALITATIVE (ARMC ONLY)
AMPHETAMINES, UR SCREEN: NOT DETECTED
Barbiturates, Ur Screen: NOT DETECTED
Cannabinoid 50 Ng, Ur ~~LOC~~: POSITIVE — AB
Cocaine Metabolite,Ur ~~LOC~~: POSITIVE — AB
MDMA (ECSTASY) UR SCREEN: NOT DETECTED
Methadone Scn, Ur: NOT DETECTED
OPIATE, UR SCREEN: NOT DETECTED
PHENCYCLIDINE (PCP) UR S: NOT DETECTED
Tricyclic, Ur Screen: NOT DETECTED

## 2018-07-04 LAB — FOLATE: Folate: 11.2 ng/mL (ref >5.9)

## 2018-07-04 LAB — TSH: TSH: 0.622 u[IU]/mL (ref 0.350–4.500)

## 2018-07-04 LAB — URINALYSIS, ROUTINE W REFLEX MICROSCOPIC
Bilirubin Urine: NEGATIVE
Glucose, UA: NEGATIVE mg/dL
HGB URINE DIPSTICK: NEGATIVE
Ketones, ur: NEGATIVE mg/dL
Leukocytes, UA: NEGATIVE
Nitrite: NEGATIVE
PROTEIN: NEGATIVE mg/dL
SPECIFIC GRAVITY, URINE: 1.011 (ref 1.005–1.030)
pH: 6 (ref 5.0–8.0)

## 2018-07-04 LAB — VITAMIN B12: Vitamin B-12: 244 pg/mL (ref 180–914)

## 2018-07-04 LAB — GLUCOSE, CAPILLARY: Glucose-Capillary: 86 mg/dL (ref 70–99)

## 2018-07-04 MED ORDER — PANTOPRAZOLE SODIUM 40 MG PO TBEC
40.0000 mg | DELAYED_RELEASE_TABLET | Freq: Two times a day (BID) | ORAL | Status: DC
  Administered 2018-07-04 – 2018-07-05 (×3): 40 mg via ORAL
  Filled 2018-07-04 (×3): qty 1

## 2018-07-04 MED ORDER — AMLODIPINE BESYLATE 10 MG PO TABS
10.0000 mg | ORAL_TABLET | Freq: Every day | ORAL | Status: DC
  Administered 2018-07-04 – 2018-07-05 (×2): 10 mg via ORAL
  Filled 2018-07-04 (×2): qty 1

## 2018-07-04 MED ORDER — HEPARIN SODIUM (PORCINE) 5000 UNIT/ML IJ SOLN
5000.0000 [IU] | Freq: Three times a day (TID) | INTRAMUSCULAR | Status: DC
  Administered 2018-07-04 (×2): 5000 [IU] via SUBCUTANEOUS
  Filled 2018-07-04 (×2): qty 1

## 2018-07-04 MED ORDER — ONDANSETRON HCL 4 MG PO TABS: 4.0000 mg | ORAL_TABLET | Freq: Four times a day (QID) | ORAL | Status: DC | PRN

## 2018-07-04 MED ORDER — DOCUSATE SODIUM 100 MG PO CAPS
100.0000 mg | ORAL_CAPSULE | Freq: Two times a day (BID) | ORAL | Status: DC
  Administered 2018-07-04 (×2): 100 mg via ORAL
  Filled 2018-07-04 (×2): qty 1

## 2018-07-04 MED ORDER — SODIUM CHLORIDE 0.9 % IV SOLN: INTRAVENOUS | Status: DC

## 2018-07-04 MED ORDER — SODIUM CHLORIDE 0.9 % IV SOLN
INTRAVENOUS | Status: DC
  Administered 2018-07-04 – 2018-07-05 (×3): via INTRAVENOUS

## 2018-07-04 MED ORDER — ACETAMINOPHEN 650 MG RE SUPP: 650.0000 mg | Freq: Four times a day (QID) | RECTAL | Status: DC | PRN

## 2018-07-04 MED ORDER — PEG 3350-KCL-NA BICARB-NACL 420 G PO SOLR
4000.0000 mL | Freq: Once | ORAL | Status: AC
  Administered 2018-07-04: 4000 mL via ORAL
  Filled 2018-07-04: qty 4000

## 2018-07-04 MED ORDER — ACETAMINOPHEN 325 MG PO TABS
650.0000 mg | ORAL_TABLET | Freq: Four times a day (QID) | ORAL | Status: DC | PRN
  Administered 2018-07-04: 650 mg via ORAL
  Filled 2018-07-04: qty 2

## 2018-07-04 MED ORDER — ONDANSETRON HCL 4 MG/2ML IJ SOLN: 4.0000 mg | Freq: Four times a day (QID) | INTRAMUSCULAR | Status: DC | PRN

## 2018-07-04 MED ORDER — HYDROMORPHONE HCL 1 MG/ML IJ SOLN: 0.5000 mg | INTRAMUSCULAR | Status: DC | PRN

## 2018-07-04 MED ORDER — BISACODYL 5 MG PO TBEC: 5.0000 mg | DELAYED_RELEASE_TABLET | Freq: Every day | ORAL | Status: DC | PRN

## 2018-07-04 MED ORDER — HYDROCODONE-ACETAMINOPHEN 5-325 MG PO TABS: 1.0000 | ORAL_TABLET | ORAL | Status: DC | PRN

## 2018-07-04 MED ORDER — POTASSIUM CHLORIDE CRYS ER 20 MEQ PO TBCR
40.0000 meq | EXTENDED_RELEASE_TABLET | Freq: Once | ORAL | Status: AC
  Administered 2018-07-04: 40 meq via ORAL
  Filled 2018-07-04: qty 2

## 2018-07-04 NOTE — Progress Notes (Signed)
[facility] [place] NAME: Luke [name]    MR#:  [id number]  DATE OF BIRTH:  06/22/1981  SUBJECTIVE:  CHIEF COMPLAINT:   Chief Complaint  Patient presents with  . Chest Pain  Patient seen today Has fatigue and weakness Lightheadedness whenever he stands up No chest pain  REVIEW OF SYSTEMS:    ROS  CONSTITUTIONAL: No documented fever. Has fatigue, weakness. No weight gain, no weight loss.  EYES: No blurry or double vision.  ENT: No tinnitus. No postnasal drip. No redness of the oropharynx.  RESPIRATORY: No cough, no wheeze, no hemoptysis. No dyspnea.  CARDIOVASCULAR: No chest pain. No orthopnea. No palpitations. No syncope.  GASTROINTESTINAL: No nausea, no vomiting or diarrhea. No abdominal pain. No melena or hematochezia.  GENITOURINARY: No dysuria or hematuria.  ENDOCRINE: No polyuria or nocturia. No heat or cold intolerance.  HEMATOLOGY: No anemia. No bruising. No bleeding.  INTEGUMENTARY: No rashes. No lesions.  MUSCULOSKELETAL: No arthritis. No swelling. No gout.  NEUROLOGIC: No numbness, tingling, or ataxia. No seizure-type activity.  PSYCHIATRIC: No anxiety. No insomnia. No ADD.   DRUG ALLERGIES:  No Known Allergies  VITALS:  Blood pressure (!) 116/98, pulse (!) 102, temperature 98.2 F (36.8 C), resp. rate 19, height 5\' 8"  (1.727 m), weight 80.5 kg, SpO2 97 %.  PHYSICAL EXAMINATION:   Physical Exam  GENERAL:  37 y.o.-year-old patient lying in the bed with no acute distress.  EYES: Pupils equal, round, reactive to light and accommodation. No scleral icterus. Extraocular muscles intact. Pallor present. HEENT: Head atraumatic, normocephalic. Oropharynx and nasopharynx clear.  NECK:  Supple, no jugular venous distention. No thyroid enlargement, no tenderness.  LUNGS: Normal breath sounds bilaterally, no wheezing, rales, rhonchi. No use of accessory muscles of respiration.  CARDIOVASCULAR: S1, S2 normal. No murmurs, rubs,  or gallops.  ABDOMEN: Soft, nontender, nondistended. Bowel sounds present. No organomegaly or mass.  EXTREMITIES: No cyanosis, clubbing or edema b/l.    NEUROLOGIC: Cranial nerves II through XII are intact. No focal Motor or sensory deficits b/l.   PSYCHIATRIC: The patient is alert and oriented x 3.  SKIN: No obvious rash, lesion, or ulcer.   LABORATORY PANEL:   CBC Recent Labs  Lab 07/04/18 0404  WBC 6.0  HGB 7.6*  HCT 26.8*  PLT 159   ------------------------------------------------------------------------------------------------------------------ Chemistries  Recent Labs  Lab 07/04/18 0404  NA 138  K 3.4*  CL 107  CO2 26  GLUCOSE 87  BUN 10  CREATININE 1.47*  CALCIUM 8.4*   ------------------------------------------------------------------------------------------------------------------  Cardiac Enzymes Recent Labs  Lab 07/03/18 2047  TROPONINI <0.03   ------------------------------------------------------------------------------------------------------------------  RADIOLOGY:  Dg Chest 2 View  Result Date: 07/03/2018 CLINICAL DATA:  Patient with chest pain. EXAM: CHEST - 2 VIEW COMPARISON:  Chest radiograph 09/07/2016 FINDINGS: Stable cardiac and mediastinal contours. No consolidative pulmonary opacities. No pleural effusion or pneumothorax. Regional skeleton is unremarkable. IMPRESSION: No acute cardiopulmonary process. Electronically Signed   By: Lovey Newcomer M.D.   On: 07/03/2018 18:16     ASSESSMENT AND PLAN:  37 year old male patient with history of hypertension, cocaine abuse, NSAID use presented to the emergency room with fatigue and weakness.  -Acute microcytic anemia GI evaluation Likely secondary to iron deficiency Further work-up by gastroenterology with endoscopy  -Gastrointestinal bleeding Avoid aspirin and NSAIDs Patient currently on PPI GI work-up  -Atypical chest pain Cycle troponin  -Anemia Mostly iron deficiency Transfuse PRBC if  hemoglobin less than 7  -Acute kidney injury  secondary to prerenal azotemia IV fluids Monitor renal function Avoid nephrotoxic drugs   All the records are reviewed and case discussed with Care Management/Social Worker. Management plans discussed with the patient, family and they are in agreement.  CODE STATUS: Full code  DVT Prophylaxis: SCDs  TOTAL TIME TAKING CARE OF THIS PATIENT: 35 minutes.   POSSIBLE D/C IN 1 to 2 DAYS, DEPENDING ON CLINICAL CONDITION.  Saundra Shelling M.D on 07/04/2018 at 12:13 PM  Between 7am to 6pm - Pager - [phone]  After 6pm go to [email or web address] - password [email or web address] [facility]  Office  [phone]  CC: Primary care physician; Patient, No Pcp Per  Note: This dictation was prepared with Dragon dictation along with smaller phrase technology. Any transcriptional errors that result from this process are unintentional.

## 2018-07-04 NOTE — Plan of Care (Signed)
  Problem: Clinical Measurements: Goal: Ability to maintain clinical measurements within normal limits will improve Outcome: Not Progressing Note:  Potassium today = only 3.4. Supplemental PO dose already administered. Will continue to monitor lab values. Luke [name] [facility]

## 2018-07-04 NOTE — Progress Notes (Signed)
Advanced care plan.  Purpose of the Encounter: CODE STATUS  Parties in Attendance: Patient  Patient's Decision Capacity: Good  Subjective/Patient's story: Presented to emergency room for fatigue, weakness   Objective/Medical story Has low hemoglobin and anemia Needs GI work-up with endoscopy colonoscopy May need PRBC transfusion and iron supplementation   Goals of care determination:  Advance care directives and goals of care discussed Plan of treatment discussed Patient wants everything done which includes cpr, intubation if need arises   CODE STATUS: Full code   Time spent discussing advanced care planning: 16 minutes

## 2018-07-04 NOTE — Consult Note (Signed)
Luke [name] , MD [address], [place], [place], [place], [address] [address] [place], [place], [place], [address] Phone: [phone]  Fax: [phone]  Consultation  Referring Provider:   Dr Estanislado Pandy Primary Care Physician:  Patient, No Pcp Per Primary Gastroenterologist: None          Reason for Consultation:     Anemia  Date of Admission:  07/03/2018 Date of Consultation:  07/04/2018         HPI:   Luke [name] is a 37 y.o. male has a history of cocaine use hypertension.  Presented to the hospital with headache lightheadedness and chest pain the past few days.  In the ER he had a CBC drawn and showed a hemoglobin of 8.1 g, creatinine of 1.66, MCV of 58.4, iron of 18, ferritin of 2.  He had presented to the emergency room in April 2018 for rectal bleeding, found to have medium-sized external hemorrhoids mild bleeding noted.  At that time he was microcytic with a hemoglobin of 10.4.  He says he has had on and off rectal bleeding for many months, stool is sometimes red in color. Denies use of any blood thinners. Has lost some weight recently. No family history of colon cancer or polyps. Last colonoscopy was>10 years back and recalls was normal. Says he last used cocaine months back.   Past Medical History:  Diagnosis Date  . Hypertension   . Known health problems: none     Past Surgical History:  Procedure Laterality Date  . none      Prior to Admission medications   Medication Sig Start Date End Date Taking? Authorizing Provider  amLODipine (NORVASC) 10 MG tablet Take 1 tablet (10 mg total) by mouth daily. Patient not taking: Reported on 07/03/2018 09/09/16   Epifanio Lesches, MD  tamsulosin (FLOMAX) 0.4 MG CAPS capsule Take 1 capsule (0.4 mg total) by mouth daily. Patient not taking: Reported on 09/07/2016 08/18/15   Lisa Roca, MD    Family History  Problem Relation Age of Onset  . CAD Maternal Grandmother      Social History   Tobacco Use  . Smoking  status: Never Smoker  . Smokeless tobacco: Never Used  Substance Use Topics  . Alcohol use: No    Comment: liquor  . Drug use: Yes    Types: Marijuana    Allergies as of 07/03/2018  . (No Known Allergies)    Review of Systems:    All systems reviewed and negative except where noted in HPI.   Physical Exam:  Vital signs in last 24 hours: Temp:  [98.1 F (36.7 C)-98.3 F (36.8 C)] 98.2 F (36.8 C) (09/02 0349) Pulse Rate:  [54-102] 102 (09/02 0922) Resp:  [16-20] 19 (09/02 0922) BP: (116-148)/(82-100) 116/98 (09/02 0922) SpO2:  [97 %-100 %] 97 % (09/02 0922) Weight:  [80.5 kg-86.2 kg] 80.5 kg (09/02 0011)   General:   Pleasant, cooperative in NAD Head:  Normocephalic and atraumatic. Eyes:   No icterus.   Conjunctiva pink. PERRLA. Ears:  Normal auditory acuity. Neck:  Supple; no masses or thyroidomegaly Lungs: Respirations even and unlabored. Lungs clear to auscultation bilaterally.   No wheezes, crackles, or rhonchi.  Heart:  Regular rate and rhythm;  Without murmur, clicks, rubs or gallops Abdomen:  Soft, nondistended, nontender. Normal bowel sounds. No appreciable masses or hepatomegaly.  No rebound or guarding.  Neurologic:  Alert and oriented x3;  grossly normal neurologically. Skin:  Intact without significant lesions  or rashes. Cervical Nodes:  No significant cervical adenopathy. Psych:  Alert and cooperative. Normal affect.  LAB RESULTS: Recent Labs    07/03/18 1739 07/04/18 0404  WBC 7.5 6.0  HGB 8.1* 7.6*  HCT 28.5* 26.8*  PLT 172 159   BMET Recent Labs    07/03/18 1739 07/04/18 0404  NA 140 138  K 3.9 3.4*  CL 105 107  CO2 26 26  GLUCOSE 87 87  BUN 11 10  CREATININE 1.66* 1.47*  CALCIUM 8.9 8.4*   LFT No results for input(s): PROT, ALBUMIN, AST, ALT, ALKPHOS, BILITOT, BILIDIR, IBILI in the last 72 hours. PT/INR No results for input(s): LABPROT, INR in the last 72 hours.  STUDIES: Dg Chest 2 View  Result Date: 07/03/2018 CLINICAL DATA:   Patient with chest pain. EXAM: CHEST - 2 VIEW COMPARISON:  Chest radiograph 09/07/2016 FINDINGS: Stable cardiac and mediastinal contours. No consolidative pulmonary opacities. No pleural effusion or pneumothorax. Regional skeleton is unremarkable. IMPRESSION: No acute cardiopulmonary process. Electronically Signed   By: Lovey Newcomer M.D.   On: 07/03/2018 18:16      Impression / Plan:   Luke [name] is a 37 y.o. y/o male admitted with lightheadedness and fatigue.  Found to have a low hemoglobin on admission his labs suggest a severe microcytic iron deficiency anemia.  He has a history of rectal bleeding attributed to hemorrhoids.  Even a year back in 2018 April, he did have a microcytic anemia but for some reason was not followed up on.  Plan 1.  IV iron 2.  Follow-up B12, check folate, check celiac serology, check urine analysis for blood loss.  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 3.  Urine drug screen to rule out any cocaine in his system and if there is no cocaine in his system he can undergo an EGD and colonoscopy tomorrow and if negative will require capsule study of the small bowel.  Stool occult testing does not indicate or rule out any form of GI source of bleeding.  Irrespective of such a result of stool he would require evaluation.  I have discussed alternative options, risks & benefits,  which include, but are not limited to, bleeding, infection, perforation,respiratory complication & drug reaction.  The patient agrees with this plan & written consent will be obtained.       Thank you for involving me in the care of this patient.      LOS: 1 day   Luke Bellows, MD  07/04/2018, 12:03 PM

## 2018-07-04 NOTE — Care Management (Signed)
RNCM attempted to assess patient for discharge needs as he is listed as uninsured and no PCP  Multiple visitors in room.  He states he is not familiar with [facility] or [facility] and appreciated the application to both. RNCM will need to follow up with patient and make referral if he meets criteria.

## 2018-07-05 ENCOUNTER — Encounter: Payer: Self-pay | Admitting: Anesthesiology

## 2018-07-05 ENCOUNTER — Emergency Department
Admission: EM | Admit: 2018-07-05 | Discharge: 2018-07-05 | Disposition: A | Discharge status: Home / Self Care | Payer: Self-pay | Attending: Emergency Medicine | Admitting: Emergency Medicine

## 2018-07-05 ENCOUNTER — Other Ambulatory Visit: Payer: Self-pay

## 2018-07-05 ENCOUNTER — Encounter
Admission: EM | Disposition: A | Discharge status: Home / Self Care | Payer: Self-pay | Source: Home / Self Care | Attending: Internal Medicine

## 2018-07-05 ENCOUNTER — Encounter: Payer: Self-pay | Admitting: Emergency Medicine

## 2018-07-05 DIAGNOSIS — K625 Hemorrhage of anus and rectum: Secondary | ICD-10-CM | POA: Insufficient documentation

## 2018-07-05 DIAGNOSIS — Z79899 Other long term (current) drug therapy: Secondary | ICD-10-CM | POA: Insufficient documentation

## 2018-07-05 DIAGNOSIS — D500 Iron deficiency anemia secondary to blood loss (chronic): Secondary | ICD-10-CM

## 2018-07-05 DIAGNOSIS — I10 Essential (primary) hypertension: Secondary | ICD-10-CM | POA: Insufficient documentation

## 2018-07-05 LAB — URINE DRUG SCREEN, QUALITATIVE (ARMC ONLY)
Amphetamines, Ur Screen: NOT DETECTED
BARBITURATES, UR SCREEN: NOT DETECTED
CANNABINOID 50 NG, UR ~~LOC~~: POSITIVE — AB
Cocaine Metabolite,Ur ~~LOC~~: POSITIVE — AB
MDMA (Ecstasy)Ur Screen: NOT DETECTED
Methadone Scn, Ur: NOT DETECTED
OPIATE, UR SCREEN: NOT DETECTED
Phencyclidine (PCP) Ur S: NOT DETECTED
Tricyclic, Ur Screen: NOT DETECTED

## 2018-07-05 LAB — COMPREHENSIVE METABOLIC PANEL
ALK PHOS: 51 U/L (ref 38–126)
ALT: 12 U/L (ref 0–44)
ANION GAP: 4 — AB (ref 5–15)
AST: 17 U/L (ref 15–41)
Albumin: 4 g/dL (ref 3.5–5.0)
BILIRUBIN TOTAL: 0.6 mg/dL (ref 0.3–1.2)
BUN: 6 mg/dL (ref 6–20)
CALCIUM: 8.6 mg/dL — AB (ref 8.9–10.3)
CO2: 24 mmol/L (ref 22–32)
Chloride: 110 mmol/L (ref 98–111)
Creatinine, Ser: 1.66 mg/dL — ABNORMAL HIGH (ref 0.61–1.24)
GFR calc Af Amer: 59 mL/min — ABNORMAL LOW (ref >60)
GFR, EST NON AFRICAN AMERICAN: 51 mL/min — AB (ref >60)
Glucose, Bld: 92 mg/dL (ref 70–99)
POTASSIUM: 3.6 mmol/L (ref 3.5–5.1)
Sodium: 138 mmol/L (ref 135–145)
TOTAL PROTEIN: 7.4 g/dL (ref 6.5–8.1)

## 2018-07-05 LAB — CBC
HCT: 28.2 % — ABNORMAL LOW (ref 40.0–52.0)
HEMATOCRIT: 26.5 % — AB (ref 40.0–52.0)
HEMOGLOBIN: 7.8 g/dL — AB (ref 13.0–18.0)
Hemoglobin: 7.5 g/dL — ABNORMAL LOW (ref 13.0–18.0)
MCH: 16.6 pg — AB (ref 26.0–34.0)
MCH: 16.4 pg — ABNORMAL LOW (ref 26.0–34.0)
MCHC: 28.2 g/dL — AB (ref 32.0–36.0)
MCHC: 27.7 g/dL — ABNORMAL LOW (ref 32.0–36.0)
MCV: 59 fL — AB (ref 80.0–100.0)
MCV: 59.3 fL — ABNORMAL LOW (ref 80.0–100.0)
Platelets: 181 10*3/uL (ref 150–440)
Platelets: 192 10*3/uL (ref 150–440)
RBC: 4.49 MIL/uL (ref 4.40–5.90)
RBC: 4.75 MIL/uL (ref 4.40–5.90)
RDW: 21.6 % — AB (ref 11.5–14.5)
RDW: 22.2 % — AB (ref 11.5–14.5)
WBC: 8.4 10*3/uL (ref 3.8–10.6)
WBC: 9.5 10*3/uL (ref 3.8–10.6)

## 2018-07-05 LAB — BASIC METABOLIC PANEL
Anion gap: 4 — ABNORMAL LOW (ref 5–15)
BUN: 5 mg/dL — AB (ref 6–20)
CALCIUM: 8.3 mg/dL — AB (ref 8.9–10.3)
CO2: 23 mmol/L (ref 22–32)
CREATININE: 1.33 mg/dL — AB (ref 0.61–1.24)
Chloride: 112 mmol/L — ABNORMAL HIGH (ref 98–111)
GFR calc Af Amer: >60 mL/min (ref >60)
GFR calc non Af Amer: >60 mL/min (ref >60)
GLUCOSE: 83 mg/dL (ref 70–99)
Potassium: 3.9 mmol/L (ref 3.5–5.1)
Sodium: 139 mmol/L (ref 135–145)

## 2018-07-05 LAB — GLUCOSE, CAPILLARY: Glucose-Capillary: 78 mg/dL (ref 70–99)

## 2018-07-05 LAB — TYPE AND SCREEN
ABO/RH(D): A POS
ANTIBODY SCREEN: NEGATIVE

## 2018-07-05 SURGERY — ESOPHAGOGASTRODUODENOSCOPY (EGD) WITH PROPOFOL
Anesthesia: General

## 2018-07-05 MED ORDER — SODIUM CHLORIDE 0.9 % IV SOLN
200.0000 mg | Freq: Once | INTRAVENOUS | Status: AC
  Administered 2018-07-05: 200 mg via INTRAVENOUS
  Filled 2018-07-05: qty 10

## 2018-07-05 MED ORDER — PANTOPRAZOLE SODIUM 40 MG PO TBEC: 40.0000 mg | DELAYED_RELEASE_TABLET | Freq: Two times a day (BID) | ORAL | 0 refills | Status: DC

## 2018-07-05 NOTE — Progress Notes (Signed)
   Vonda Antigua, MD [address] [place], [place], [place], [address] [address] [place] [address], [place], [place], [place], [address] Phone: [phone]  Fax: [phone]   Subjective:  Patient completed entire prep.  Denies any abdominal pain, nausea vomiting, hematochezia or melena.  Objective: Exam: Vital signs in last 24 hours: Vitals:   07/04/18 1551 07/04/18 1940 07/05/18 0342 07/05/18 0747  BP: 108/70 104/86 104/74 114/86  Pulse: 68 70 (!) 57 66  Resp: 18 18 18 18   Temp: 98.7 F (37.1 C) 98.1 F (36.7 C) 98.5 F (36.9 C) 98.6 F (37 C)  TempSrc: Oral   Oral  SpO2: 99% 100% 100% 100%  Weight:   80.7 kg   Height:       Weight change: -5.443 kg  Intake/Output Summary (Last 24 hours) at 07/05/2018 0943 Last data filed at 07/05/2018 0700 Gross per 24 hour  Intake 862.16 ml  Output 550 ml  Net 312.16 ml    General: No acute distress, AAO x3 Abd: Soft, NT/ND, No HSM Skin: Warm, no rashes Neck: Supple, Trachea midline   Lab Results: Lab Results  Component Value Date   WBC 8.4 07/05/2018   HGB 7.5 (L) 07/05/2018   HCT 26.5 (L) 07/05/2018   MCV 59.0 (L) 07/05/2018   PLT 181 07/05/2018   Micro Results: No results found for this or any previous visit (from the past 240 hour(s)). Studies/Results: Dg Chest 2 View  Result Date: 07/03/2018 CLINICAL DATA:  Patient with chest pain. EXAM: CHEST - 2 VIEW COMPARISON:  Chest radiograph 09/07/2016 FINDINGS: Stable cardiac and mediastinal contours. No consolidative pulmonary opacities. No pleural effusion or pneumothorax. Regional skeleton is unremarkable. IMPRESSION: No acute cardiopulmonary process. Electronically Signed   By: Lovey Newcomer M.D.   On: 07/03/2018 18:16   Medications:  Scheduled Meds: . amLODipine  10 mg Oral Daily  . docusate sodium  100 mg Oral BID  . pantoprazole  40 mg Oral BID   Continuous Infusions: . sodium chloride 75 mL/hr at 07/05/18 0633  . sodium chloride     PRN Meds:.acetaminophen **OR**  acetaminophen, bisacodyl, HYDROcodone-acetaminophen, HYDROmorphone (DILAUDID) injection, ondansetron **OR** ondansetron (ZOFRAN) IV   Assessment: Active Problems:   GIB (gastrointestinal bleeding)    Plan: Plan for EGD and colonoscopy due to microcytic iron deficiency anemia Urine drug screen was positive for cocaine yesterday Urine drug screen pending today If it is still positive, anesthesia is unlikely to proceed with procedures today This was discussed with the patient and he verbalized understanding If it is negative, proceed with EGD and colonoscopy as planned  IV iron transfusion as an inpatient given severe iron deficiency No signs of active GI bleeding at this time Continue serial CBCs Continue PPI twice daily   LOS: 2 days   Vonda Antigua, MD 07/05/2018, 9:43 AM

## 2018-07-05 NOTE — ED Notes (Signed)
Pt. Verbalizes understanding of d/c instructions, medications, and follow-up. pain controlled per pt, refused discharge vitals.  Pt. In NAD at time of d/c and denies further concerns regarding this visit. Pt. Stable at the time of departure from the unit, departing unit by the safest and most appropriate manner per that pt condition and limitations with all belongings accounted for. Pt advised to return to the ED at any time for emergent concerns, or for new/worsening symptoms.

## 2018-07-05 NOTE — Progress Notes (Signed)
Discharge instructions explained to pt/ verbalized an understanding/ iv and tele removed/ RX given to pt/ tolerating solid foods/ will transport off unit via wheelchair when ride arrives.

## 2018-07-05 NOTE — Plan of Care (Signed)
Patient is not actively bleeding. Patient has completed prep for colonoscopy.

## 2018-07-05 NOTE — Discharge Instructions (Signed)
It was so nice to meet you during this hospitalization!  You came into the hospital because you were having weakness, fatigue, and lightheadedness. We found that your hemoglobin was low, which was likely causing your symptoms. We need to make sure that you are not bleeding from your intestines. We were unable to do your colonoscopy and endoscopy while you were in the hospital, so please make sure you call the gastroenterologist to get these scheduled ASAP.  -Dr. Brett Albino

## 2018-07-05 NOTE — ED Provider Notes (Addendum)
[facility] Emergency Department Provider Note  ____________________________________________   I have reviewed the triage vital signs and the nursing notes. Where available I have reviewed prior notes and, if possible and indicated, outside hospital notes.    HISTORY  Chief Complaint Rectal Bleeding    HPI Luke [name] is a 37 y.o. male cocaine abuse had a microcytic anemia and some GI bleeding issues apparently and was discharged from the hospital today.  Went home, had a cookout burger, noticed about a thimble full as he describes it of blood in his stool and came in for that reason.  He has not had any vomiting or diarrhea.  No other bleeding.  He states he feels otherwise just fine. Nothing makes it better nothing makes it worse no other alleviating or aggravating factors, he has no lightheadedness or other complaints.  Is not on a blood thinners.  Has not had any anoscopy or endoscopy.  Scheduled as an outpatient.   Past Medical History:  Diagnosis Date  . Hypertension   . Known health problems: none     Patient Active Problem List   Diagnosis Date Noted  . GIB (gastrointestinal bleeding) 07/03/2018  . Pneumonia 09/07/2016    Past Surgical History:  Procedure Laterality Date  . none      Prior to Admission medications   Medication Sig Start Date End Date Taking? Authorizing Provider  amLODipine (NORVASC) 10 MG tablet Take 1 tablet (10 mg total) by mouth daily. Patient not taking: Reported on 07/03/2018 09/09/16   Epifanio Lesches, MD  pantoprazole (PROTONIX) 40 MG tablet Take 1 tablet (40 mg total) by mouth 2 (two) times daily. 07/05/18   Mayo, Pete Pelt, MD    Allergies Patient has no known allergies.  Family History  Problem Relation Age of Onset  . CAD Maternal Grandmother     Social History Social History   Tobacco Use  . Smoking status: Never Smoker  . Smokeless tobacco: Never Used  Substance Use Topics  . Alcohol use: No     Comment: liquor  . Drug use: Yes    Types: Marijuana    Review of Systems Constitutional: No fever/chills Eyes: No visual changes. ENT: No sore throat. No stiff neck no neck pain Cardiovascular: Denies chest pain. Respiratory: Denies shortness of breath. Gastrointestinal:   no vomiting.  No diarrhea.  No constipation. Genitourinary: Negative for dysuria. Musculoskeletal: Negative lower extremity swelling Skin: Negative for rash. Neurological: Negative for severe headaches, focal weakness or numbness.   ____________________________________________   PHYSICAL EXAM:  VITAL SIGNS: ED Triage Vitals  Enc Vitals Group     BP 07/05/18 2009 108/71     Pulse Rate 07/05/18 2009 85     Resp 07/05/18 2009 19     Temp 07/05/18 2009 98.5 F (36.9 C)     Temp Source 07/05/18 2009 Oral     SpO2 07/05/18 2009 100 %     Weight 07/05/18 2013 180 lb (81.6 kg)     Height 07/05/18 2013 5\' 8"  (1.727 m)     Head Circumference --      Peak Flow --      Pain Score 07/05/18 2013 0     Pain Loc --      Pain Edu? --      Excl. in [place]? --     Constitutional: Alert and oriented. Well appearing and in no acute distress. Eyes: Conjunctivae are normal Head: Atraumatic HEENT: No congestion/rhinnorhea. Mucous membranes are moist.  Oropharynx non-erythematous Neck:   Nontender with no meningismus, no masses, no stridor Cardiovascular: Normal rate, regular rhythm. Grossly normal heart sounds.  Good peripheral circulation. Respiratory: Normal respiratory effort.  No retractions. Lungs CTAB. Abdominal: Soft and nontender. No distention. No guarding no rebound Back:  There is no focal tenderness or step off.  there is no midline tenderness there are no lesions noted. there is no CVA tenderness Rectal: declines Musculoskeletal: No lower extremity tenderness, no upper extremity tenderness. No joint effusions, no DVT signs strong distal pulses no edema Neurologic:  Normal speech and language. No gross  focal neurologic deficits are appreciated.  Skin:  Skin is warm, dry and intact. No rash noted. Psychiatric: Mood and affect are normal. Speech and behavior are normal.  ____________________________________________   LABS (all labs ordered are listed, but only abnormal results are displayed)  Labs Reviewed  COMPREHENSIVE METABOLIC PANEL  CBC  TYPE AND SCREEN    Pertinent labs  results that were available during my care of the patient were reviewed by me and considered in my medical decision making (see chart for details). ____________________________________________  EKG  I personally interpreted any EKGs ordered by me or triage  ____________________________________________  RADIOLOGY  Pertinent labs & imaging results that were available during my care of the patient were reviewed by me and considered in my medical decision making (see chart for details). If possible, patient and/or family made aware of any abnormal findings.  No results found. ____________________________________________    PROCEDURES  Procedure(s) performed: None  Procedures  Critical Care performed: None  ____________________________________________   INITIAL IMPRESSION / ASSESSMENT AND PLAN / ED COURSE  Pertinent labs & imaging results that were available during my care of the patient were reviewed by me and considered in my medical decision making (see chart for details).  Pt with recurrent scant bleeding. Appears well. Will recheck blood work.   ----------------------------------------- 11:27 PM on 07/05/2018 -----------------------------------------  Patient's hemoglobin is actually up from this morning has had no bleeding during an observational period here, we have talked to him about admission but he is adamant that he would like to go home which I do not think is unreasonable.  We will discharge him.  He had a thimble full blood and has had no bleeding since.  He already has close  follow-up with his GI doctor.  The rest of his blood work is similar to what it was when he went home and he has no complaints.    ____________________________________________   FINAL CLINICAL IMPRESSION(S) / ED DIAGNOSES  Final diagnoses:  None      This chart was dictated using voice recognition software.  Despite best efforts to proofread,  errors can occur which can change meaning.      Schuyler Amor, MD 07/05/18 2052    Schuyler Amor, MD 07/05/18 2328

## 2018-07-05 NOTE — Discharge Summary (Signed)
[facility] NAME: Luke [name]    MR#:  [id number]  DATE OF BIRTH:  Feb 27, 1981  DATE OF ADMISSION:  07/03/2018   ADMITTING PHYSICIAN: Amelia Jo, MD  DATE OF DISCHARGE: 07/05/18  PRIMARY CARE PHYSICIAN: Patient, No Pcp Per   ADMISSION DIAGNOSIS:  Symptomatic anemia [D64.9] Gastrointestinal hemorrhage, unspecified gastrointestinal hemorrhage type [K92.2] DISCHARGE DIAGNOSIS:  Active Problems:   GIB (gastrointestinal bleeding)  SECONDARY DIAGNOSIS:   Past Medical History:  Diagnosis Date  . Hypertension   . Known health problems: none    HOSPITAL COURSE:   Luke [name] is a 37 year old male with a PMH of HTN and cocaine use who presented to the ED with weakness, fatigue, and lightheadedness over the course of a few days. In the ED, Hgb was 8.1 and Cr was 1.66. He was admitted for symptomatic anemia.  Acute microcytic anemia- Hgb 8.1 on admission, but stabilized at 7.5 on the day of discharged - seen by GI, who initially wanted to do a colonoscopy/endoscopy, but patient was positive for cocaine, so they recommended outpatient work-up. - given IV iron x 2 for severe iron deficiency - aspirin and NSAIDs avoided - discharged home on PPI  Atypical chest pain- resolved. May be secondary to cocaine use.  - troponins negative x 3  Acute kidney injury with history of CKD III- felt to be secondary to prerenal azotemia - improved with IVFs  Hypertension- normotensive this hospitalization - continued home norvasc  DISCHARGE CONDITIONS:  Acute microcytic anemia Atypical chest pain- resolved AKI in CKD III Hypertension CONSULTS OBTAINED:  Treatment Team:  Luke Bellows, MD DRUG ALLERGIES:  No Known Allergies DISCHARGE MEDICATIONS:   Allergies as of 07/05/2018   No Known Allergies     Medication List    STOP taking these medications   tamsulosin 0.4 MG Caps capsule Commonly known as:  FLOMAX     TAKE these medications    amLODipine 10 MG tablet Commonly known as:  NORVASC Take 1 tablet (10 mg total) by mouth daily.   pantoprazole 40 MG tablet Commonly known as:  PROTONIX Take 1 tablet (40 mg total) by mouth 2 (two) times daily.        DISCHARGE INSTRUCTIONS:  1. F/u with PCP in 1-2 weeks 2. F/u with GI ASAP for outpatient endoscopy/colonoscopy 3. Recheck CBC and Cr as an outpatient 4. Continued counseling regarding cocaine cessation DIET:  Cardiac diet DISCHARGE CONDITION:  Stable ACTIVITY:  Activity as tolerated OXYGEN:  Home Oxygen: No.  Oxygen Delivery: room air DISCHARGE LOCATION:  home   If you experience worsening of your admission symptoms, develop shortness of breath, life threatening emergency, suicidal or homicidal thoughts you must seek medical attention immediately by calling 911 or calling your MD immediately  if symptoms less severe.  You Must read complete instructions/literature along with all the possible adverse reactions/side effects for all the Medicines you take and that have been prescribed to you. Take any new Medicines after you have completely understood and accpet all the possible adverse reactions/side effects.   Please note  You were cared for by a hospitalist during your hospital stay. If you have any questions about your discharge medications or the care you received while you were in the hospital after you are discharged, you can call the unit and asked to speak with the hospitalist on call if the hospitalist that took care of you is not available. Once you are discharged, your primary  care physician will handle any further medical issues. Please note that NO REFILLS for any discharge medications will be authorized once you are discharged, as it is imperative that you return to your primary care physician (or establish a relationship with a primary care physician if you do not have one) for your aftercare needs so that they can reassess your need for medications and  monitor your lab values.    On the day of Discharge:  VITAL SIGNS:  Blood pressure 114/86, pulse 66, temperature 98.6 F (37 C), temperature source Oral, resp. rate 18, height 5\' 8"  (1.727 m), weight 80.7 kg, SpO2 100 %. PHYSICAL EXAMINATION:  GENERAL:  37 y.o.-year-old patient lying in the bed with no acute distress. Tired-appearing, but non-toxic. EYES: Pupils equal, round, reactive to light and accommodation. No scleral icterus. Extraocular muscles intact.  HEENT: Head atraumatic, normocephalic. Oropharynx and nasopharynx clear.  NECK:  Supple, no jugular venous distention. No thyroid enlargement, no tenderness.  LUNGS: Normal breath sounds bilaterally, no wheezing, rales,rhonchi or crepitation. No use of accessory muscles of respiration.  CARDIOVASCULAR: S1, S2 normal. No murmurs, rubs, or gallops.  ABDOMEN: Soft, non-tender, non-distended. Bowel sounds present. No organomegaly or mass.  EXTREMITIES: No pedal edema, cyanosis, or clubbing.  NEUROLOGIC: Cranial nerves II through XII are intact. Muscle strength 5/5 in all extremities. Sensation intact. Gait not checked.  PSYCHIATRIC: The patient is alert and oriented x 3.  SKIN: No obvious rash, lesion, or ulcer.  DATA REVIEW:   CBC Recent Labs  Lab 07/05/18 0333  WBC 8.4  HGB 7.5*  HCT 26.5*  PLT 181    Chemistries  Recent Labs  Lab 07/05/18 0333  NA 139  K 3.9  CL 112*  CO2 23  GLUCOSE 83  BUN 5*  CREATININE 1.33*  CALCIUM 8.3*     Microbiology Results  Results for orders placed or performed during the hospital encounter of 09/07/16  Culture, blood (routine x 2)     Status: None   Collection Time: 09/07/16  3:54 AM  Result Value Ref Range Status   Specimen Description BLOOD L HAND  Final   Special Requests   Final    BOTTLES DRAWN AEROBIC AND ANAEROBIC AER 5ML ANA 4ML   Culture NO GROWTH 5 DAYS  Final   Report Status 09/12/2016 FINAL  Final  Culture, blood (routine x 2)     Status: None   Collection Time:  09/07/16  3:54 AM  Result Value Ref Range Status   Specimen Description BLOOD L AC  Final   Special Requests   Final    BOTTLES DRAWN AEROBIC AND ANAEROBIC AER 3ML ANA 4ML   Culture NO GROWTH 5 DAYS  Final   Report Status 09/12/2016 FINAL  Final  MRSA PCR Screening     Status: None   Collection Time: 09/07/16  5:42 AM  Result Value Ref Range Status   MRSA by PCR NEGATIVE NEGATIVE Final    Comment:        The GeneXpert MRSA Assay (FDA approved for NASAL specimens only), is one component of a comprehensive MRSA colonization surveillance program. It is not intended to diagnose MRSA infection nor to guide or monitor treatment for MRSA infections.     RADIOLOGY:  No results found.   Management plans discussed with the patient, family and they are in agreement.  CODE STATUS: Full Code   TOTAL TIME TAKING CARE OF THIS PATIENT: 35 minutes.    Berna Spare Mayo M.D on 07/05/2018  at 2:27 PM  Between 7am to 6pm - Pager - [phone]  After 6pm go to [email or web address] - Proofreader  [facility]  Office  [phone]  CC: Primary care physician; Patient, No Pcp Per   Note: This dictation was prepared with Dragon dictation along with smaller phrase technology. Any transcriptional errors that result from this process are unintentional.

## 2018-07-05 NOTE — Care Management Note (Signed)
Case Management Note  Patient Details  Name: Luke [name] MRN: [id number] Date of Birth: 1981/08/26  Subjective/Objective:       Discharge order placed for patient to discharge to home.  Patient states he has BCBS through [employer]".  Insurance information not listed on face sheet.  Trying to plan follow up appointments with possible referral to [facility] and Medication management if BCBS not active.  Patient is on room air and is independent in the home.     No services required at home at this point.   Action/Plan:   [facility] (pre-service center) for verification of insurance.     Expected Discharge Date:  07/05/18               Expected Discharge Plan:     In-House Referral:     Discharge planning Services  CM Consult, [facility], Medication Assistance  Post Acute Care Choice:    Choice offered to:  Patient  DME Arranged:    DME Agency:     HH Arranged:    [place] Agency:     Status of Service:  In process, will continue to follow  If discussed at Long Length of Stay Meetings, dates discussed:    Additional Comments:  Elza Rafter, RN 07/05/2018, 3:21 PM

## 2018-07-05 NOTE — Care Management Note (Addendum)
Case Management Note  Patient Details  Name: Luke [name] MRN: [id number] Date of Birth: 05-23-1981  Subjective/Objective:            Action/Plan:  Coupon provided for pantoprazole 40mg  two times a day from [employer] and notified patient that Norvasc is $4.00 at [employer].  Patient states he can afford his medication.  [facility]/ Medication Management application given on 9-2 by RNCM.  Patient states at this time he does not need to apply.  No further needs at this time.  Addendum: BCBS policy has termed.     Expected Discharge Date:  07/05/18               Expected Discharge Plan:     In-House Referral:     Discharge planning Services  CM Consult, [facility], Medication Assistance  Post Acute Care Choice:    Choice offered to:  Patient  DME Arranged:    DME Agency:     HH Arranged:    [place] Agency:     Status of Service:  In process, will continue to follow  If discussed at Long Length of Stay Meetings, dates discussed:    Additional Comments:  Elza Rafter, RN 07/05/2018, 3:48 PM

## 2018-07-05 NOTE — Discharge Instructions (Addendum)
If you have significant bleeding, lightheadedness, or you feel worse in any way return to the emergency department.  Avoid taking Motrin ibuprofen or other similar medications.  Tylenol should be okay.  Avoid aspirin.

## 2018-07-05 NOTE — ED Triage Notes (Signed)
Patient ambulatory to triage with steady gait, without difficulty or distress noted; pt reports being seen Sunday for GI bleeding and dx with anemia; has colonoscopy sched for 9/12; went to cookout and noted rectal bleeding again; pt denies any pain

## 2018-07-06 LAB — HIV ANTIBODY (ROUTINE TESTING W REFLEX): HIV SCREEN 4TH GENERATION: NONREACTIVE

## 2018-07-07 ENCOUNTER — Encounter: Payer: Self-pay | Admitting: Gastroenterology

## 2018-07-07 LAB — CELIAC DISEASE PANEL
ENDOMYSIAL ANTIBODY IGA: NEGATIVE
IgA: 227 mg/dL (ref 90–386)
Tissue Transglutaminase Ab, IgA: <2 U/mL (ref 0–3)

## 2018-07-14 ENCOUNTER — Encounter: Payer: Self-pay | Admitting: Gastroenterology

## 2018-07-14 ENCOUNTER — Ambulatory Visit (INDEPENDENT_AMBULATORY_CARE_PROVIDER_SITE_OTHER): Payer: Self-pay | Admitting: Gastroenterology

## 2018-07-14 VITALS — BP 108/72 | Ht 68.0 in | Wt 178.6 lb

## 2018-07-14 DIAGNOSIS — Z0283 Encounter for blood-alcohol and blood-drug test: Secondary | ICD-10-CM

## 2018-07-14 DIAGNOSIS — K922 Gastrointestinal hemorrhage, unspecified: Secondary | ICD-10-CM

## 2018-07-14 NOTE — Progress Notes (Signed)
Luke Antigua, MD [address]  [place]  [place], [place] [address]  Main: [phone]  Fax: [phone]   Primary Care Physician: Patient, No Pcp Per  Primary Gastroenterologist:  Dr. Vonda [name]  Chief Complaint  Patient presents with  . New Patient (Initial Visit)    ED REFERRAL symptomatic anemia, GI Hemorrhage, (c/o right lower quad pain and dizziness, currently no bleeding    HPI: Luke [name] is a 37 y.o. male here for follow-up of iron deficiency anemia.  Patient was recently in the hospital for the same, but EGD and colonoscopy could not be done due to positive cocaine screen.  Patient states he has been abstinent of drugs since hospital discharge.  No melena, hematochezia, abdominal pain, nausea or vomiting, dysphagia.  No prior EGD or colonoscopy.  Current Outpatient Medications  Medication Sig Dispense Refill  . amLODipine (NORVASC) 10 MG tablet Take 1 tablet (10 mg total) by mouth daily. 30 tablet o  . pantoprazole (PROTONIX) 40 MG tablet Take 1 tablet (40 mg total) by mouth 2 (two) times daily. 60 tablet 0   No current facility-administered medications for this visit.     Allergies as of 07/14/2018  . (No Known Allergies)    ROS:  General: Negative for anorexia, weight loss, fever, chills, fatigue, weakness. ENT: Negative for hoarseness, difficulty swallowing , nasal congestion. CV: Negative for chest pain, angina, palpitations, dyspnea on exertion, peripheral edema.  Respiratory: Negative for dyspnea at rest, dyspnea on exertion, cough, sputum, wheezing.  GI: See history of present illness. GU:  Negative for dysuria, hematuria, urinary incontinence, urinary frequency, nocturnal urination.  Reports swelling in the left inguinal region Endo: Negative for unusual weight change.    Physical Examination:   BP 108/72 (BP Location: Left Arm, Patient Position: Sitting, Cuff Size: Normal)   Ht 5\' 8"  (1.727 m)   Wt 178 lb 9.6 oz (81 kg)    BMI 27.16 kg/m   General: Well-nourished, well-developed in no acute distress.  Eyes: No icterus. Conjunctivae pink. Mouth: Oropharyngeal mucosa moist and pink , no lesions erythema or exudate. Neck: Supple, Trachea midline Abdomen: Bowel sounds are normal, nontender, nondistended, no hepatosplenomegaly or masses, no abdominal bruits or hernia , no rebound or guarding. Left inguinal bulge present (patient evaluated with nurse, Sharyn Lull, in the room) Extremities: No lower extremity edema. No clubbing or deformities. Neuro: Alert and oriented x 3.  Grossly intact. Skin: Warm and dry, no jaundice.   Psych: Alert and cooperative, normal mood and affect.   Labs: CMP     Component Value Date/Time   NA 138 07/05/2018 2038   NA 138 04/23/2013 2126   K 3.6 07/05/2018 2038   K 3.6 04/23/2013 2126   CL 110 07/05/2018 2038   CL 103 04/23/2013 2126   CO2 24 07/05/2018 2038   CO2 26 04/23/2013 2126   GLUCOSE 92 07/05/2018 2038   GLUCOSE 188 (H) 04/23/2013 2126   BUN 6 07/05/2018 2038   BUN 15 04/23/2013 2126   CREATININE 1.66 (H) 07/05/2018 2038   CREATININE 1.78 (H) 04/23/2013 2126   CALCIUM 8.6 (L) 07/05/2018 2038   CALCIUM 9.2 04/23/2013 2126   PROT 7.4 07/05/2018 2038   PROT 7.9 04/23/2013 2126   ALBUMIN 4.0 07/05/2018 2038   ALBUMIN 3.8 04/23/2013 2126   AST 17 07/05/2018 2038   AST 13 (L) 04/23/2013 2126   ALT 12 07/05/2018 2038   ALT 20 04/23/2013 2126   ALKPHOS 51 07/05/2018 2038  ALKPHOS 69 04/23/2013 2126   BILITOT 0.6 07/05/2018 2038   BILITOT 0.4 04/23/2013 2126   GFRNONAA 51 (L) 07/05/2018 2038   GFRNONAA 49 (L) 04/23/2013 2126   GFRAA 59 (L) 07/05/2018 2038   GFRAA 57 (L) 04/23/2013 2126   Lab Results  Component Value Date   WBC 9.5 07/05/2018   HGB 7.8 (L) 07/05/2018   HCT 28.2 (L) 07/05/2018   MCV 59.3 (L) 07/05/2018   PLT 192 07/05/2018    Imaging Studies: Dg Chest 2 View  Result Date: 07/03/2018 CLINICAL DATA:  Patient with chest pain. EXAM: CHEST -  2 VIEW COMPARISON:  Chest radiograph 09/07/2016 FINDINGS: Stable cardiac and mediastinal contours. No consolidative pulmonary opacities. No pleural effusion or pneumothorax. Regional skeleton is unremarkable. IMPRESSION: No acute cardiopulmonary process. Electronically Signed   By: Lovey Newcomer M.D.   On: 07/03/2018 18:16    Assessment and Plan:   Luke [name] is a 37 y.o. y/o male here for follow-up of iron deficiency anemia  We will repeat drug screen today We will schedule for EGD and colonoscopy Patient was told us to transfuse for positive procedures will need to be rescheduled Patient was also told he will need a urine drug screen on the day of the procedure and he verbalized understanding Patient encouraged to stay away from all illicit drugs and he verbalized understanding  Will refer to hematology for evaluation of IV iron replacement Will refer to general surgery due to left inguinal hernia  The importance of establishing primary care were discussed the patient in detail and the list was given to him, to establish primary care.  He was told he likely has underlying kidney disease and needs to establish primary care for further work-up of this and he verbalized understanding.  Avoid NSAIDs   Dr Luke [name]

## 2018-07-14 NOTE — Patient Instructions (Signed)
Please get appt with PCP before next clinic appt You will receive a call regarding: Referral for surgery for inquinal hernia Referral for iron def anemia from hematologist at [facility].

## 2018-07-15 ENCOUNTER — Other Ambulatory Visit: Payer: Self-pay

## 2018-07-15 DIAGNOSIS — D500 Iron deficiency anemia secondary to blood loss (chronic): Secondary | ICD-10-CM

## 2018-07-15 DIAGNOSIS — K922 Gastrointestinal hemorrhage, unspecified: Secondary | ICD-10-CM

## 2018-07-15 DIAGNOSIS — K4030 Unilateral inguinal hernia, with obstruction, without gangrene, not specified as recurrent: Secondary | ICD-10-CM

## 2018-07-15 DIAGNOSIS — Z0283 Encounter for blood-alcohol and blood-drug test: Secondary | ICD-10-CM

## 2018-07-18 ENCOUNTER — Encounter: Payer: Self-pay | Admitting: *Deleted

## 2018-07-23 ENCOUNTER — Other Ambulatory Visit: Payer: Self-pay | Admitting: Hematology and Oncology

## 2018-07-23 DIAGNOSIS — D509 Iron deficiency anemia, unspecified: Secondary | ICD-10-CM | POA: Insufficient documentation

## 2018-07-23 DIAGNOSIS — E538 Deficiency of other specified B group vitamins: Secondary | ICD-10-CM | POA: Insufficient documentation

## 2018-07-23 NOTE — Progress Notes (Deleted)
[facility] day:  07/23/2018  Chief Complaint: Luke [name] is a 37 y.o. male with iron deficiency anemia who is referred in consultation by Dr. Bonna Gains for assessment and management.  HPI:  Patient has a chronic microcytic anemia.  Labs dating back to 04/23/2013 revealed a hemoglobin 9.2 and MCV 62.  He was seen in the [facility] ER on 02/09/2017 with rectal bleeding.  He had medium sized external hemorrhoids.  Hemoglobin was 10.4 with an MCV of 63.4.  Creatinine was 1.47.  The patient was admitted to [facility] from 07/03/2018 - 07/05/2018 with symptomatic anemia.  He presented with weakness, fatigue, and lightheadedness.  Hemoglobin was 8.1 with an MCV 58.4.  Ferritin was 2 with an iron saturation of 4% and a TIBC 441.  B12 was 244 (low).  Folate was 9.1.  Creatinine was 1.66. TSH was 0.622.  Urinalysis revealed no hematuria.  He was seen in consultation by Dr. Vicente Males, GI.  He did not undergo EGD and colonoscopy secondary to positive drug screen for cocaine.  He was discharged on a PPI.  Aspirin and NSAIDs were to be avoided.     Past Medical History:  Diagnosis Date  . Hypertension   . Known health problems: none     Past Surgical History:  Procedure Laterality Date  . none      Family History  Problem Relation Age of Onset  . CAD Maternal Grandmother     Social History:  reports that he has never smoked. He has never used smokeless tobacco. He reports that he has current or past drug history. Drug: Marijuana. He reports that he does not drink alcohol.  The patient is accompanied by *** alone today.  Allergies: No Known Allergies  Current Medications: Current Outpatient Medications  Medication Sig Dispense Refill  . amLODipine (NORVASC) 10 MG tablet Take 1 tablet (10 mg total) by mouth daily. 30 tablet o  . pantoprazole (PROTONIX) 40 MG tablet Take 1 tablet (40 mg total) by mouth 2 (two) times daily. 60 tablet 0   No current  facility-administered medications for this visit.     Review of Systems:  GENERAL:  Feels good.  Active.  No fevers, sweats or weight loss. PERFORMANCE STATUS (ECOG):  *** HEENT:  No visual changes, runny nose, sore throat, mouth sores or tenderness. Lungs: No shortness of breath or cough.  No hemoptysis. Cardiac:  No chest pain, palpitations, orthopnea, or PND. GI:  No nausea, vomiting, diarrhea, constipation, melena or hematochezia. GU:  No urgency, frequency, dysuria, or hematuria. Musculoskeletal:  No back pain.  No joint pain.  No muscle tenderness. Extremities:  No pain or swelling. Skin:  No rashes or skin changes. Neuro:  No headache, numbness or weakness, balance or coordination issues. Endocrine:  No diabetes, thyroid issues, hot flashes or night sweats. Psych:  No mood changes, depression or anxiety. Pain:  No focal pain. Review of systems:  All other systems reviewed and found to be negative.  Physical Exam: There were no vitals taken for this visit. GENERAL:  Well developed, well nourished, **man sitting comfortably in the exam room in no acute distress. MENTAL STATUS:  Alert and oriented to person, place and time. HEAD:  *** hair.  Normocephalic, atraumatic, face symmetric, no Cushingoid features. EYES:  *** eyes.  Pupils equal round and reactive to light and accomodation.  No conjunctivitis or scleral icterus. ENT:  Oropharynx clear without lesion.  Tongue normal. Mucous membranes moist.  RESPIRATORY:  Clear to auscultation without rales, wheezes or rhonchi. CARDIOVASCULAR:  Regular rate and rhythm without murmur, rub or gallop. ABDOMEN:  Soft, non-tender, with active bowel sounds, and no hepatosplenomegaly.  No masses. SKIN:  No rashes, ulcers or lesions. EXTREMITIES: No edema, no skin discoloration or tenderness.  No palpable cords. LYMPH NODES: No palpable cervical, supraclavicular, axillary or inguinal adenopathy  NEUROLOGICAL: Unremarkable. PSYCH:   Appropriate.   No visits with results within 3 Day(s) from this visit.  Latest known visit with results is:  Admission on 07/05/2018, Discharged on 07/05/2018  Component Date Value Ref Range Status  . Sodium 07/05/2018 138  135 - 145 mmol/L Final  . Potassium 07/05/2018 3.6  3.5 - 5.1 mmol/L Final  . Chloride 07/05/2018 110  98 - 111 mmol/L Final  . CO2 07/05/2018 24  22 - 32 mmol/L Final  . Glucose, Bld 07/05/2018 92  70 - 99 mg/dL Final  . BUN 07/05/2018 6  6 - 20 mg/dL Final  . Creatinine, Ser 07/05/2018 1.66* 0.61 - 1.24 mg/dL Final  . Calcium 07/05/2018 8.6* 8.9 - 10.3 mg/dL Final  . Total Protein 07/05/2018 7.4  6.5 - 8.1 g/dL Final  . Albumin 07/05/2018 4.0  3.5 - 5.0 g/dL Final  . AST 07/05/2018 17  15 - 41 U/L Final  . ALT 07/05/2018 12  0 - 44 U/L Final  . Alkaline Phosphatase 07/05/2018 51  38 - 126 U/L Final  . Total Bilirubin 07/05/2018 0.6  0.3 - 1.2 mg/dL Final  . GFR calc non Af Amer 07/05/2018 51* >60 mL/min Final  . GFR calc Af Amer 07/05/2018 59* >60 mL/min Final   Comment: (NOTE) The eGFR has been calculated using the CKD EPI equation. This calculation has not been validated in all clinical situations. eGFR's persistently <60 mL/min signify possible Chronic Kidney Disease.   Georgiann Hahn gap 07/05/2018 4* 5 - 15 Final   Performed at [facility], [address] [place], [place] [address]  . WBC 07/05/2018 9.5  3.8 - 10.6 K/uL Final  . RBC 07/05/2018 4.75  4.40 - 5.90 MIL/uL Final  . Hemoglobin 07/05/2018 7.8* 13.0 - 18.0 g/dL Final  . HCT 07/05/2018 28.2* 40.0 - 52.0 % Final  . MCV 07/05/2018 59.3* 80.0 - 100.0 fL Final  . MCH 07/05/2018 16.4* 26.0 - 34.0 pg Final  . MCHC 07/05/2018 27.7* 32.0 - 36.0 g/dL Final  . RDW 07/05/2018 22.2* 11.5 - 14.5 % Final  . Platelets 07/05/2018 192  150 - 440 K/uL Final   Comment: PLATELET CLUMPS NOTED ON SMEAR, COUNT APPEARS ADEQUATE Performed at [facility], [address], [place], [place] [address]    . ABO/RH(D) 07/05/2018 A POS   Final  . Antibody Screen 07/05/2018 NEG   Final  . Sample Expiration 07/05/2018    Final                   Value:07/08/2018 Performed at [facility], [address], [place], [place] [address]     Assessment:  Luke [name] is a 37 y.o. male  with iron deficiency anemia.  He has a history of bleeding hemorrhoids.  Urinalysis reveals no hematuria.  Diet is xxx.  Ferritin was 2 on 07/03/2018.  He has B12 deficiency.  B12 was 244 (low) and folate 9.1 on 07/03/2018.  TSH was normal.  He has chronic renal insufficiency.  Creatinine has ranged between 1.33 - 1.94 over the past 5 years.  Creatinine  was 1.66 on 07/05/2018.   Symptomatically,  Plan: 1.   Labs today:  CBC with diff, ferritin, anti-parietal antibody, intrinsic factor antibody. 2.   3.   [place], MD  07/23/2018, 7:35 AM   I saw and evaluated the patient, participating in the key portions of the service and reviewing pertinent diagnostic studies and records.  I reviewed the nurse practitioner's note and agree with the findings and the plan.  The assessment and plan were discussed with the patient.  Additional diagnostic studies of *** are needed to clarify *** and would change the clinical management.  A few ***multiple questions were asked by the patient and answered.   Nolon Stalls, MD 07/25/2018,3:23 AM

## 2018-07-25 ENCOUNTER — Inpatient Hospital Stay: Payer: Self-pay | Admitting: Hematology and Oncology

## 2018-07-27 ENCOUNTER — Ambulatory Visit: Payer: Self-pay | Admitting: Surgery

## 2018-07-27 ENCOUNTER — Telehealth: Payer: Self-pay

## 2018-07-27 NOTE — Telephone Encounter (Signed)
Pt is unable to afford prep per mother. A suprep kit left at front desk for pick up.

## 2018-07-28 ENCOUNTER — Ambulatory Visit
Admission: RE | Admit: 2018-07-28 | Discharge: 2018-07-28 | Disposition: A | Discharge status: Home / Self Care | Payer: Self-pay | Source: Ambulatory Visit | Attending: Gastroenterology | Admitting: Gastroenterology

## 2018-07-28 ENCOUNTER — Encounter: Payer: Self-pay | Admitting: *Deleted

## 2018-07-28 ENCOUNTER — Ambulatory Visit: Payer: Self-pay | Admitting: Anesthesiology

## 2018-07-28 ENCOUNTER — Encounter
Admission: RE | Disposition: A | Discharge status: Home / Self Care | Payer: Self-pay | Source: Ambulatory Visit | Attending: Gastroenterology

## 2018-07-28 DIAGNOSIS — I10 Essential (primary) hypertension: Secondary | ICD-10-CM | POA: Insufficient documentation

## 2018-07-28 DIAGNOSIS — D374 Neoplasm of uncertain behavior of colon: Secondary | ICD-10-CM | POA: Insufficient documentation

## 2018-07-28 DIAGNOSIS — D509 Iron deficiency anemia, unspecified: Secondary | ICD-10-CM | POA: Insufficient documentation

## 2018-07-28 DIAGNOSIS — K2950 Unspecified chronic gastritis without bleeding: Secondary | ICD-10-CM | POA: Insufficient documentation

## 2018-07-28 DIAGNOSIS — K922 Gastrointestinal hemorrhage, unspecified: Secondary | ICD-10-CM

## 2018-07-28 DIAGNOSIS — K3189 Other diseases of stomach and duodenum: Secondary | ICD-10-CM

## 2018-07-28 DIAGNOSIS — K2289 Other specified disease of esophagus: Secondary | ICD-10-CM

## 2018-07-28 DIAGNOSIS — D500 Iron deficiency anemia secondary to blood loss (chronic): Secondary | ICD-10-CM

## 2018-07-28 DIAGNOSIS — K5730 Diverticulosis of large intestine without perforation or abscess without bleeding: Secondary | ICD-10-CM

## 2018-07-28 DIAGNOSIS — K317 Polyp of stomach and duodenum: Secondary | ICD-10-CM

## 2018-07-28 DIAGNOSIS — K209 Esophagitis, unspecified: Secondary | ICD-10-CM | POA: Insufficient documentation

## 2018-07-28 DIAGNOSIS — K228 Other specified diseases of esophagus: Secondary | ICD-10-CM

## 2018-07-28 DIAGNOSIS — D490 Neoplasm of unspecified behavior of digestive system: Secondary | ICD-10-CM

## 2018-07-28 HISTORY — PX: ESOPHAGOGASTRODUODENOSCOPY (EGD) WITH PROPOFOL: SHX5813

## 2018-07-28 HISTORY — PX: COLONOSCOPY WITH PROPOFOL: SHX5780

## 2018-07-28 LAB — URINE DRUG SCREEN, QUALITATIVE (ARMC ONLY)
Amphetamines, Ur Screen: NOT DETECTED
BENZODIAZEPINE, UR SCRN: NOT DETECTED
Barbiturates, Ur Screen: NOT DETECTED
COCAINE METABOLITE, UR ~~LOC~~: NOT DETECTED
Cannabinoid 50 Ng, Ur ~~LOC~~: NOT DETECTED
MDMA (ECSTASY) UR SCREEN: NOT DETECTED
METHADONE SCREEN, URINE: NOT DETECTED
OPIATE, UR SCREEN: NOT DETECTED
Phencyclidine (PCP) Ur S: NOT DETECTED
Tricyclic, Ur Screen: NOT DETECTED

## 2018-07-28 SURGERY — COLONOSCOPY WITH PROPOFOL
Anesthesia: General

## 2018-07-28 MED ORDER — PROPOFOL 500 MG/50ML IV EMUL
INTRAVENOUS | Status: DC | PRN
  Administered 2018-07-28: 140 ug/kg/min via INTRAVENOUS

## 2018-07-28 MED ORDER — SODIUM CHLORIDE 0.9 % IV SOLN
INTRAVENOUS | Status: DC
  Administered 2018-07-28: 10:00:00 via INTRAVENOUS

## 2018-07-28 MED ORDER — SPOT INK MARKER SYRINGE KIT
PACK | SUBMUCOSAL | Status: DC | PRN
  Administered 2018-07-28: 9 mL via SUBMUCOSAL

## 2018-07-28 MED ORDER — PROPOFOL 10 MG/ML IV BOLUS
INTRAVENOUS | Status: AC
  Filled 2018-07-28: qty 20

## 2018-07-28 MED ORDER — LIDOCAINE HCL (CARDIAC) PF 100 MG/5ML IV SOSY
PREFILLED_SYRINGE | INTRAVENOUS | Status: DC | PRN
  Administered 2018-07-28: 100 mg via INTRAVENOUS

## 2018-07-28 MED ORDER — PROPOFOL 10 MG/ML IV BOLUS
INTRAVENOUS | Status: DC | PRN
  Administered 2018-07-28: 110 mg via INTRAVENOUS

## 2018-07-28 MED ORDER — PROPOFOL 500 MG/50ML IV EMUL
INTRAVENOUS | Status: AC
  Filled 2018-07-28: qty 50

## 2018-07-28 NOTE — Anesthesia Post-op Follow-up Note (Signed)
Anesthesia QCDR form completed.        

## 2018-07-28 NOTE — Op Note (Addendum)
[facility] Gastroenterology Patient Name: Lory Galan Procedure Date: 07/28/2018 10:10 AM MRN: [id number] Account #: [id number] Date of Birth: April 03, 1981 Admit Type: Outpatient Age: 37 Room: [facility] ENDO ROOM 2 Gender: Male Note Status: Finalized Procedure:            Colonoscopy Indications:          Iron deficiency anemia Providers:            Diani Jillson B. Bonna Gains MD, MD Medicines:            Monitored Anesthesia Care Complications:        No immediate complications. Procedure:            Pre-Anesthesia Assessment:                       - Prior to the procedure, a History and Physical was                        performed, and patient medications, allergies and                        sensitivities were reviewed. The patient's tolerance of                        previous anesthesia was reviewed.                       - The risks and benefits of the procedure and the                        sedation options and risks were discussed with the                        patient. All questions were answered and informed                        consent was obtained.                       - Patient identification and proposed procedure were                        verified prior to the procedure by the physician, the                        nurse, the anesthesiologist, the anesthetist and the                        technician. The procedure was verified in the                        pre-procedure area in the procedure room in the                        endoscopy suite.                       - Prophylactic Antibiotics: The patient does not                        require prophylactic antibiotics.                       -  ASA Grade Assessment: II - A patient with mild                        systemic disease.                       - After reviewing the risks and benefits, the patient                        was deemed in satisfactory condition to undergo the   procedure.                       - Monitored anesthesia care was determined to be                        medically necessary for this procedure based on review                        of the patient's medical history, medications, and                        prior anesthesia history.                       - The anesthesia plan was to use monitored anesthesia                        care (MAC).                       After obtaining informed consent, the colonoscope was                        passed under direct vision. Throughout the procedure,                        the patient's blood pressure, pulse, and oxygen                        saturations were monitored continuously. The                        Colonoscope was introduced through the anus and                        advanced to the the cecum, identified by appendiceal                        orifice and ileocecal valve. The colonoscopy was                        performed with ease. The patient tolerated the                        procedure well. The quality of the bowel preparation                        was good. Findings:      A frond-like/villous non-obstructing large mass was found in the cecum.       The mass was non-circumferential. The mass measured twenty cm in length.  No bleeding was present. This was biopsied with a cold forceps for       histology. The biopsy were obtained from the top of the mass and not the       base to not cause scarring in the area and affect future interventions.       Area was tattooed with an injection of Spot (carbon black). The tattoo       was placed one fold distal to the mass.      The appendiceal orifice appeared normal. It is unclear if the mass is       originating from the cecum itself or if the ileocecal valve is involved.       Due to the large size of the mass with a large mass the ileocecal valve       or terminal ileum could not be examined.      Multiple small-mouthed  diverticula were found in the sigmoid colon.      The exam was otherwise without abnormality on direct and retroflexion       views. Impression:           - Rule out malignancy, tumor in the cecum. Biopsied.                        Tattooed. (Surgery team may consider CT scan to                        evaluate extent of mass and involvement of ileocecal                        valve vs evaluation of the same during surgery.)                       - The appendiceal orifice is normal.                       - Diverticulosis in the sigmoid colon.                       - The examination was otherwise normal on direct and                        retroflexion views. Recommendation:       - Refer to a surgeon at the next available appointment.                        Surgery team may consider CT scan to evaluate extent of                        mass and involvement of ileocecal valve vs evaluation                        of the same during surgery.                       - Return to primary care physician in 2 weeks.                       - Return to my office as previously scheduled.                       -  You were referred to Hematology for iron deficiency                        anemia. Please schedule this appointment as soon as                        possible.                       - Await pathology results.                       - The findings and recommendations were discussed with                        the patient.                       - The findings and recommendations were discussed with                        the patient's family.                       - Continue present medications.                       - Discharge patient to home.                       - Resume previous diet. Procedure Code(s):    --- Professional ---                       [phone], Colonoscopy, flexible; with directed submucosal                        injection(s), any substance                       62130,  Colonoscopy, flexible; with biopsy, single or                        multiple Diagnosis Code(s):    --- Professional ---                       D49.0, Neoplasm of unspecified behavior of digestive                        system                       D50.9, Iron deficiency anemia, unspecified                       K57.30, Diverticulosis of large intestine without                        perforation or abscess without bleeding CPT copyright 2017 [employer]. All rights reserved. The codes documented in this report are preliminary and upon coder review may  be revised to meet current compliance requirements.  Vonda Antigua, MD Margretta Sidle B. Bonna Gains MD, MD 07/28/2018 12:09:29 PM This report has been signed electronically. Number of Addenda: 0 Note Initiated On: 07/28/2018 10:10 AM Scope Withdrawal Time:  0 hours 19 minutes 22 seconds  Total Procedure Duration: 0 hours 28 minutes 55 seconds  Estimated Blood Loss: Estimated blood loss: none.      [facility]

## 2018-07-28 NOTE — Transfer of Care (Signed)
Immediate Anesthesia Transfer of Care Note  Patient: [name]  Procedure(s) Performed: COLONOSCOPY WITH PROPOFOL (N/A ) ESOPHAGOGASTRODUODENOSCOPY (EGD) WITH PROPOFOL (N/A )  Patient Location: PACU and Endoscopy Unit  Anesthesia Type:General  Level of Consciousness: sedated  Airway & Oxygen Therapy: Patient Spontanous Breathing  Post-op Assessment: Report given to RN  Post vital signs: stable  Last Vitals:  Vitals Value Taken Time  BP 108/78 07/28/2018 12:06 PM  Temp 36.1 C 07/28/2018 12:06 PM  Pulse 58 07/28/2018 12:09 PM  Resp 18 07/28/2018 12:09 PM  SpO2 100 % 07/28/2018 12:09 PM  Vitals shown include unvalidated device data.  Last Pain:  Vitals:   07/28/18 1206  TempSrc: Tympanic  PainSc: Asleep         Complications: No apparent anesthesia complications

## 2018-07-28 NOTE — Progress Notes (Signed)
After 3 attempts to collect urine specimen patient states "I can't go". Pt. Aware that we need a urine drug screen prior to procedure starting. He understands. Dr. Amie Critchley notified and ok'd to offer to start IV to help fill the bladder. Pt. Agrees to get IV started.

## 2018-07-28 NOTE — Anesthesia Preprocedure Evaluation (Signed)
Anesthesia Evaluation  Patient identified by MRN, date of birth, ID band Patient awake    Reviewed: Allergy & Precautions, H&P , NPO status , Patient's Chart, lab work & pertinent test results  History of Anesthesia Complications Negative for: history of anesthetic complications  Airway Mallampati: II  TM Distance: >3 FB Neck ROM: full    Dental  (+) Chipped, Poor Dentition   Pulmonary neg shortness of breath, pneumonia,           Cardiovascular hypertension, (-) angina(-) Past MI      Neuro/Psych negative neurological ROS  negative psych ROS   GI/Hepatic negative GI ROS, Neg liver ROS, neg GERD  ,(+)     substance abuse  cocaine use,   Endo/Other  negative endocrine ROS  Renal/GU negative Renal ROS  negative genitourinary   Musculoskeletal   Abdominal   Peds  Hematology negative hematology ROS (+)   Anesthesia Other Findings Past Medical History: No date: Hypertension No date: Known health problems: none  Past Surgical History: No date: none  BMI    Body Mass Index:  27.37 kg/m      Reproductive/Obstetrics negative OB ROS                             Anesthesia Physical Anesthesia Plan  ASA: III  Anesthesia Plan: General   Post-op Pain Management:    Induction: Intravenous  PONV Risk Score and Plan: Propofol infusion and TIVA  Airway Management Planned: Natural Airway and Nasal Cannula  Additional Equipment:   Intra-op Plan:   Post-operative Plan:   Informed Consent: I have reviewed the patients History and Physical, chart, labs and discussed the procedure including the risks, benefits and alternatives for the proposed anesthesia with the patient or authorized representative who has indicated his/her understanding and acceptance.   Dental Advisory Given  Plan Discussed with: Anesthesiologist, CRNA and Surgeon  Anesthesia Plan Comments: (Patient consented for  risks of anesthesia including but not limited to:  - adverse reactions to medications - risk of intubation if required - damage to teeth, lips or other oral mucosa - sore throat or hoarseness - Damage to heart, brain, lungs or loss of life  Patient voiced understanding.)        Anesthesia Quick Evaluation

## 2018-07-28 NOTE — H&P (Signed)
Vonda Antigua, MD [address], [place], [place], [place], [address] [address], [place], [place], [place], [address] Phone: [phone]  Fax: [phone]  Primary Care Physician:  Patient, No Pcp Per   Pre-Procedure History & Physical: HPI:  Luke [name] is a 37 y.o. male is here for a colonoscopy and EGD.   Past Medical History:  Diagnosis Date  . Hypertension   . Known health problems: none     Past Surgical History:  Procedure Laterality Date  . none      Prior to Admission medications   Medication Sig Start Date End Date Taking? Authorizing Provider  amLODipine (NORVASC) 10 MG tablet Take 1 tablet (10 mg total) by mouth daily. 09/09/16  Yes Epifanio Lesches, MD  pantoprazole (PROTONIX) 40 MG tablet Take 1 tablet (40 mg total) by mouth 2 (two) times daily. 07/05/18  Yes Mayo, Pete Pelt, MD    Allergies as of 07/15/2018  . (No Known Allergies)    Family History  Problem Relation Age of Onset  . CAD Maternal Grandmother     Social History   Socioeconomic History  . Marital status: Single    Spouse name: Not on file  . Number of children: Not on file  . Years of education: Not on file  . Highest education level: Not on file  Occupational History  . Not on file  Social Needs  . Financial resource strain: Not on file  . Food insecurity:    Worry: Not on file    Inability: Not on file  . Transportation needs:    Medical: Not on file    Non-medical: Not on file  Tobacco Use  . Smoking status: Never Smoker  . Smokeless tobacco: Never Used  Substance and Sexual Activity  . Alcohol use: No    Comment: liquor  . Drug use: Not Currently    Types: Marijuana  . Sexual activity: Not Currently  Lifestyle  . Physical activity:    Days per week: Not on file    Minutes per session: Not on file  . Stress: Not on file  Relationships  . Social connections:    Talks on phone: Not on file    Gets together: Not on file    Attends religious  service: Not on file    Active member of club or organization: Not on file    Attends meetings of clubs or organizations: Not on file    Relationship status: Not on file  . Intimate partner violence:    Fear of current or ex partner: Not on file    Emotionally abused: Not on file    Physically abused: Not on file    Forced sexual activity: Not on file  Other Topics Concern  . Not on file  Social History Narrative  . Not on file    Review of Systems: See HPI, otherwise negative ROS  Physical Exam: BP 117/82   Pulse 67   Temp (!) 96 F (35.6 C) (Tympanic)   Resp 16   Ht 5\' 8"  (1.727 m)   Wt 81.6 kg   SpO2 100%   BMI 27.37 kg/m  General:   Alert,  pleasant and cooperative in NAD Head:  Normocephalic and atraumatic. Neck:  Supple; no masses or thyromegaly. Lungs:  Clear throughout to auscultation, normal respiratory effort.    Heart:  +S1, +S2, Regular rate and rhythm, No edema. Abdomen:  Soft, nontender and nondistended. Normal bowel sounds, without guarding, and without rebound.  Neurologic:  Alert and  oriented x4;  grossly normal neurologically.  Impression/Plan: Luke [name] is here for a colonoscopy to be performed for iron deficiency anemia and EGD for iron deficiency anemia  Risks, benefits, limitations, and alternatives regarding the procedures have been reviewed with the patient.  Questions have been answered.  All parties agreeable.   Virgel Manifold, MD  07/28/2018, 11:02 AM

## 2018-07-28 NOTE — Anesthesia Postprocedure Evaluation (Signed)
Anesthesia Post Note  Patient: Luke [name]  Procedure(s) Performed: COLONOSCOPY WITH PROPOFOL (N/A ) ESOPHAGOGASTRODUODENOSCOPY (EGD) WITH PROPOFOL (N/A )  Patient location during evaluation: Endoscopy Anesthesia Type: General Level of consciousness: awake and alert Pain management: pain level controlled Vital Signs Assessment: post-procedure vital signs reviewed and stable Respiratory status: spontaneous breathing, nonlabored ventilation and respiratory function stable Cardiovascular status: blood pressure returned to baseline and stable Postop Assessment: no apparent nausea or vomiting Anesthetic complications: no     Last Vitals:  Vitals:   07/28/18 1206 07/28/18 1236  BP: 108/78 (!) 133/99  Pulse: 61   Resp: 19   Temp: (!) 36.1 C   SpO2: 98%     Last Pain:  Vitals:   07/28/18 1236  TempSrc:   PainSc: 0-No pain                 Alphonsus Sias

## 2018-07-28 NOTE — Op Note (Signed)
[facility] Gastroenterology Patient Name: Luke [name] Procedure Date: 07/28/2018 10:26 AM MRN: [id number] Account #: [id number] Date of Birth: Mar 21, 1981 Admit Type: Outpatient Age: 37 Room: [facility] ENDO ROOM 2 Gender: Male Note Status: Finalized Procedure:            Upper GI endoscopy Indications:          Iron deficiency anemia Providers:            Josef Tourigny B. Bonna Gains MD, MD Medicines:            Monitored Anesthesia Care Complications:        No immediate complications. Procedure:            Pre-Anesthesia Assessment:                       - Prior to the procedure, a History and Physical was                        performed, and patient medications, allergies and                        sensitivities were reviewed. The patient's tolerance of                        previous anesthesia was reviewed.                       - The risks and benefits of the procedure and the                        sedation options and risks were discussed with the                        patient. All questions were answered and informed                        consent was obtained.                       - Patient identification and proposed procedure were                        verified prior to the procedure by the physician, the                        nurse, the anesthesiologist, the anesthetist and the                        technician. The procedure was verified in the procedure                        room.                       - ASA Grade Assessment: II - A patient with mild                        systemic disease.                       After obtaining informed consent, the endoscope was  passed under direct vision. Throughout the procedure,                        the patient's blood pressure, pulse, and oxygen                        saturations were monitored continuously. The Endoscope                        was introduced through the mouth, and advanced  to the                        second part of duodenum. The upper GI endoscopy was                        accomplished with ease. The patient tolerated the                        procedure well. Findings:      There were esophageal mucosal changes suspicious for short-segment       Barrett's esophagus present at the gastroesophageal junction. The       maximum longitudinal extent of these mucosal changes was 0.5 cm in       length. Mucosa was biopsied with a cold forceps for histology in 4       quadrants at the gastroesophageal junction.      Patchy mildly erythematous mucosa without bleeding was found in the       gastric antrum. Biopsies were taken with a cold forceps for histology.       Biopsies were obtained in the gastric body, at the incisura and in the       gastric antrum with cold forceps for histology.      A few 2 to 3 mm sessile polyps with no bleeding and no stigmata of       recent bleeding were found in the gastric body. Biopsies were taken with       a cold forceps for histology.      The duodenal bulb, second portion of the duodenum and examined duodenum       were normal. Biopsies for histology were taken with a cold forceps for       evaluation of celiac disease. Impression:           - Esophageal mucosal changes suspicious for                        short-segment Barrett's esophagus. Biopsied.                       - Erythematous mucosa in the antrum. Biopsied.                       - A few gastric polyps. Biopsied.                       - Normal duodenal bulb, second portion of the duodenum                        and examined duodenum. Biopsied.                       - Biopsies  were obtained in the gastric body, at the                        incisura and in the gastric antrum. Recommendation:       - Await pathology results.                       - Discharge patient to home (with escort).                       - Advance diet as tolerated.                       -  Continue present medications.                       - Patient has a contact number available for                        emergencies. The signs and symptoms of potential                        delayed complications were discussed with the patient.                        Return to normal activities tomorrow. Written discharge                        instructions were provided to the patient.                       - Discharge patient to home (with escort).                       - The findings and recommendations were discussed with                        the patient.                       - The findings and recommendations were discussed with                        the patient's family. Procedure Code(s):    --- Professional ---                       [phone], Esophagogastroduodenoscopy, flexible, transoral;                        with biopsy, single or multiple Diagnosis Code(s):    --- Professional ---                       K22.8, Other specified diseases of esophagus                       K31.89, Other diseases of stomach and duodenum                       K31.7, Polyp of stomach and duodenum                       D50.9, Iron deficiency  anemia, unspecified CPT copyright 2017 [employer] Medical Association. All rights reserved. The codes documented in this report are preliminary and upon coder review may  be revised to meet current compliance requirements.  Vonda Antigua, MD Margretta Sidle B. Bonna Gains MD, MD 07/28/2018 11:28:22 AM This report has been signed electronically. Number of Addenda: 0 Note Initiated On: 07/28/2018 10:26 AM Estimated Blood Loss: Estimated blood loss: none.      [facility]

## 2018-07-29 LAB — SURGICAL PATHOLOGY

## 2018-08-01 ENCOUNTER — Encounter: Payer: Self-pay | Admitting: Gastroenterology

## 2018-08-03 NOTE — Progress Notes (Signed)
[facility] day:  08/04/2018  Chief Complaint: Luke [name] is a 37 y.o. male with iron deficiency anemia who is referred in consultation by Dr. Bonna Gains for assessment and management.  HPI:  Patient has a chronic microcytic anemia.  Labs dating back to 04/23/2013 revealed a hemoglobin 9.2 and MCV 62.  He was seen in the [facility] ER on 02/09/2017 with rectal bleeding.  He had medium sized external hemorrhoids.  Hemoglobin was 10.4 with an MCV of 63.4.  Creatinine was 1.47.  The patient was admitted to [facility] from 07/03/2018 - 07/05/2018 with symptomatic anemia.  He presented with weakness, fatigue, and lightheadedness.  Hemoglobin was 8.1 with an MCV 58.4.  Ferritin was 2 with an iron saturation of 4% and a TIBC 441.  B12 was 244 (low).  Folate was 9.1.  Creatinine was 1.66. TSH was 0.622.  Urinalysis revealed no hematuria.  He was seen in consultation by Dr. Vicente Males, GI, on 07/04/2018.  He did not undergo EGD and colonoscopy secondary to positive drug screen for cocaine.  He was discharged on a PPI.  Aspirin and NSAIDs were to be avoided.  EGD on 07/28/2018 with Dr. Bonna Gains revealed esophageal mucosal changes suspicious for short-segment Barrett's esophagus.  There was erythematous mucosa in the antrum. There was a few gastric polyps.  There was a normal duodenum. Small bowel biopsy was negative.  There was mild reactive gastropathy with minimal chronic inactive inflammation.  GE junction revealed squamocolumnar mucosa with chronic inflammation.  Colonoscopy on 07/28/2018 revealed a 20 cm frond-like/villous non-obstructing large mass was found in the cecum.  The mass was non-circumferential. No bleeding was present. The area was tattooed with an injection of Spot (carbon black). The tattoo was placed one fold distal to the mass. The appendiceal orifice appeared normal. It was unclear if the mass was originating from the cecum itself or if the ileocecal valve  was involved.  Due to the large size of the mass, the ileocecal valve or terminal ileum could not be examined.  Multiple small-mouthed diverticula were found in the sigmoid colon.  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  There was no high grade dysplasia or malignancy.  Symptomatically, patient is "cold all of the time" and fatigued. He experiences muscle soreness. He has appreciated both hematochezia and melena episodes over the course of the last 5 months. No exertional shortness of breath or episodes of chest pain. Patient has had recent colonoscopy that revealed a colonic mass. He is scheduled to see Dr. Tama High (surgery) later this morning. Patient denies that he has experienced any B symptoms. He denies any interval infections.   Patient advises that he maintains an adequate appetite. He is eating well. Weight today is 189 lb 3 oz (85.8 kg); baseline weight is 205 pounds. He has unintentionally lost 16 pounds in the last 6 months due to recurrent episodes of nausea and vomiting. Patient maintains a diet rich in iron. He indicates that he eats meat and green leafy vegetables on a consistent basis. He denies ice pica and restless legs. Patient has been taking oral iron daily, however he does not take it with a source of vitamin C.   He has experimented with cocaine and marijuana, but does not use theses substances regularly. He drinks alcohol "during football season".   Patient denies pain in the clinic today.  Maternal grandmother had pancreatic cancer. Maternal great-grandmother had breast cancer. Maternal grandfather had laryngeal cancer.  Past Medical History:  Diagnosis Date  . Hypertension   . Known health problems: none     Past Surgical History:  Procedure Laterality Date  . COLONOSCOPY WITH PROPOFOL N/A 07/28/2018   Procedure: COLONOSCOPY WITH PROPOFOL;  Surgeon: Virgel Manifold, MD;  Location: [facility] ENDOSCOPY;  Service: Endoscopy;  Laterality: N/A;  .  ESOPHAGOGASTRODUODENOSCOPY (EGD) WITH PROPOFOL N/A 07/28/2018   Procedure: ESOPHAGOGASTRODUODENOSCOPY (EGD) WITH PROPOFOL;  Surgeon: Virgel Manifold, MD;  Location: [facility] ENDOSCOPY;  Service: Endoscopy;  Laterality: N/A;  . none      Family History  Problem Relation Age of Onset  . CAD Maternal Grandmother   . Cancer Maternal Grandmother   . Cancer Mother   . Cancer Maternal Grandfather     Social History:  reports that he has never smoked. He has never used smokeless tobacco. He reports that he has current or past drug history. Drug: Marijuana. He reports that he does not drink alcohol.  He has experimented with cocaine and marijuana, but does not use theses substances regularly. He drinks alcohol "during football season".   Patient is not currently working. The patient is accompanied by is mother today.  Allergies: No Known Allergies  Current Medications: Current Outpatient Medications  Medication Sig Dispense Refill  . amLODipine (NORVASC) 10 MG tablet Take 1 tablet (10 mg total) by mouth daily. 30 tablet o  . pantoprazole (PROTONIX) 40 MG tablet Take 1 tablet (40 mg total) by mouth 2 (two) times daily. 60 tablet 0   No current facility-administered medications for this visit.     Review of Systems:  GENERAL:  Feels cold and fatigued.  No fevers or sweats.  Unintentional weight loss of 16 pounds in the past 6 months. PERFORMANCE STATUS (ECOG):  1 HEENT:  No visual changes, runny nose, sore throat, mouth sores or tenderness. Lungs: No shortness of breath or cough.  No hemoptysis. Cardiac:  No chest pain, palpitations, orthopnea, or PND. GI:  Hematochezia and melena.  Emesis 4-5 months ago, now improving.  No diarrhea or constipation. GU:  No urgency, frequency, dysuria, or hematuria. Musculoskeletal:  No back pain.  No joint pain.  No muscle tenderness. Extremities:  No pain or swelling. Skin:  Skin darker per mother.  No rashes or skin changes. Neuro:  Intermittent  headache.  No numbness or weakness, balance or coordination issues. Endocrine:  No diabetes, thyroid issues, hot flashes or night sweats. Psych:  No mood changes, depression or anxiety. Pain:  No focal pain. Review of systems:  All other systems reviewed and found to be negative.  Physical Exam: Blood pressure (!) 136/91, pulse 67, temperature 98.3 F (36.8 C), temperature source Tympanic, resp. rate 18, height 5\' 8"  (1.727 m), weight 189 lb 3 oz (85.8 kg). GENERAL:  Well developed, well nourished, gentleman sitting comfortably in the exam room in no acute distress. MENTAL STATUS:  Alert and oriented to person, place and time. HEAD:  Short black hair.  Normocephalic, atraumatic, face symmetric, no Cushingoid features. EYES:  Brown eyes.  Pupils equal round and reactive to light and accomodation.  No conjunctivitis or scleral icterus. ENT:  Oropharynx clear without lesion.  Tongue normal. Mucous membranes moist.  RESPIRATORY:  Clear to auscultation without rales, wheezes or rhonchi. CARDIOVASCULAR:  Regular rate and rhythm without murmur, rub or gallop. ABDOMEN:  Soft, non-tender, with active bowel sounds, and no hepatosplenomegaly.  No masses. SKIN:  No rashes, ulcers or lesions. EXTREMITIES: No edema, no skin discoloration or tenderness.  No  palpable cords. LYMPH NODES: No palpable cervical, supraclavicular, axillary or inguinal adenopathy  NEUROLOGICAL: Unremarkable. PSYCH:  Appropriate.   No visits with results within 3 Day(s) from this visit.  Latest known visit with results is:  Admission on 07/28/2018, Discharged on 07/28/2018  Component Date Value Ref Range Status  . Tricyclic, Ur Screen 84/69/6295 NONE DETECTED  NONE DETECTED Final  . Amphetamines, Ur Screen 07/28/2018 NONE DETECTED  NONE DETECTED Final  . MDMA (Ecstasy)Ur Screen 07/28/2018 NONE DETECTED  NONE DETECTED Final  . Cocaine Metabolite,Ur [place] 07/28/2018 NONE DETECTED  NONE DETECTED Final  . Opiate, Ur Screen  07/28/2018 NONE DETECTED  NONE DETECTED Final  . Phencyclidine (PCP) Ur S 07/28/2018 NONE DETECTED  NONE DETECTED Final  . Cannabinoid 50 Ng, Ur [place] 07/28/2018 NONE DETECTED  NONE DETECTED Final  . Barbiturates, Ur Screen 07/28/2018 NONE DETECTED  NONE DETECTED Final  . Benzodiazepine, Ur [place]rn 07/28/2018 NONE DETECTED  NONE DETECTED Final  . Methadone Scn, Ur 07/28/2018 NONE DETECTED  NONE DETECTED Final   Comment: (NOTE) Tricyclics + metabolites, urine    Cutoff 1000 ng/mL Amphetamines + metabolites, urine  Cutoff 1000 ng/mL MDMA (Ecstasy), urine              Cutoff 500 ng/mL Cocaine Metabolite, urine          Cutoff 300 ng/mL Opiate + metabolites, urine        Cutoff 300 ng/mL Phencyclidine (PCP), urine         Cutoff 25 ng/mL Cannabinoid, urine                 Cutoff 50 ng/mL Barbiturates + metabolites, urine  Cutoff 200 ng/mL Benzodiazepine, urine              Cutoff 200 ng/mL Methadone, urine                   Cutoff 300 ng/mL The urine drug screen provides only a preliminary, unconfirmed analytical test result and should not be used for non-medical purposes. Clinical consideration and professional judgment should be applied to any positive drug screen result due to possible interfering substances. A more specific alternate chemical method must be used in order to obtain a confirmed analytical result. Gas chromatography / mass spectrometry (GC/MS) is the preferred confirmat                          ory method. Performed at [facility], [address], [place], [place] [address]   . SURGICAL PATHOLOGY 07/28/2018    Final                   Value:Surgical Pathology CASE: [id number] PATIENT: Luke [name] Surgical Pathology Report     SPECIMEN SUBMITTED: A. Small bowel r/o celiac; cbxs B. Stomach erythema r/o H pylori; cbxs C. Stomach polyp; cbx D. GEJ, r/o Barrett's; cbxs E. Colon mass, cecum; cbxs  CLINICAL HISTORY: None provided  PRE-OPERATIVE  DIAGNOSIS: Iron def anemia D50.9; GI hemorrhage K92.2  POST-OPERATIVE DIAGNOSIS: Gastric erythema, Cecal mass     DIAGNOSIS: A.  SMALL BOWEL; COLD BIOPSY: - SMALL INTESTINAL MUCOSA WITH INTACT VILLI. - NEGATIVE FOR ACTIVE INFLAMMATION, INTRAEPITHELIAL LYMPHOCYTOSIS, AND INFECTIOUS AGENTS.  B. STOMACH, ERYTHEMA; COLD BIOPSY: - MILD REACTIVE GASTROPATHY WITH MINIMAL CHRONIC INACTIVE INFLAMMATION. - NEGATIVE FOR H. PYLORI, INTESTINAL METAPLASIA, DYSPLASIA, AND MALIGNANCY.  C.  STOMACH POLYP; COLD BIOPSY: - FUNDIC GLAND POLYP. - NEGATIVE FOR DYSPLASIA AND MALIGNANCY.  D.  GASTROESOPHAGEAL JUNCTION; COLD BIOPSY: - SQUAMOCOLUMNAR MUCOSA WITH MILD                          CHRONIC INFLAMMATION. - NEGATIVE FOR GOBLET CELLS, DYSPLASIA, AND MALIGNANCY.  E.  COLON MASS, CECUM; COLD BIOPSY: - VILLOUS ADENOMA, 3 FRAGMENTS. - NEGATIVE FOR HIGH-GRADE DYSPLASIA AND MALIGNANCY IN THIS SAMPLE, SEE COMMENT  Comment: The biopsies from the cecal mass (E) are superficial samples from a larger lesion which requires complete removal. A more serious process cannot be excluded. Additional deeper sections were reviewed.   GROSS DESCRIPTION: A. Labeled: Small bowel cbx rule out celiac Received: In formalin Tissue fragment(s): 5 Size: 0.3-0.5 cm Description: Pink-tan fragments Entirely submitted in one cassette.  B. Labeled: Gastric erythema cbx rule out H. pylori Received: In formalin Tissue fragment(s): 4 Size: 0.2-0.5 cm Description: Pink-tan fragments Entirely submitted in one cassette.  C. Labeled: Gastric polyp cbx Received: In formalin Tissue fragment(s): 1 Size: Six 0.4 cm Description: Pink-tan fragment Entirely submitted in one casse                         tte.  D. Labeled: GEJ cbx rule out Barrett's Received: In formalin Tissue fragment(s): 4 Size: 0.2-0.3 cm Description: Pink-tan fragments Entirely submitted in one cassette.  E. Labeled: Cecal mass cbx Received:  In formalin Tissue fragment(s): Multiple Size: Aggregate, 0.6 x 0.3 x 0.1 cm Description: Tan tissue fragments Entirely submitted in one cassette.   Final Diagnosis performed by Bryan Lemma, MD.   Electronically signed 07/29/2018 5:06:33PM The electronic signature indicates that the named Attending Pathologist has evaluated the specimen  Technical component performed at [facility], [address] [place], [place] [address] Lab: [phone] Dir: Rush Farmer, MD, MMM  Professional component performed at [facility], [facility], [place], [place], [place] [address] Lab: [phone] Dir: Dellia Nims. Reuel Derby, MD     Assessment:  Luke [name] is a 37 y.o. male  with iron deficiency anemia.  He has a history of bleeding hemorrhoids.  Urinalysis reveals no hematuria.  Diet is good.  He is on oral iron.  Ferritin was 2 on 07/03/2018.  EGD on 07/28/2018 revealed esophageal mucosal changes suspicious for short-segment Barrett's esophagus.  There was erythematous mucosa in the antrum. There was a few gastric polyps.  Small bowel biopsy was negative.  There was mild reactive gastropathy with minimal chronic inactive inflammation.  GE junction revealed squamocolumnar mucosa with chronic inflammation.  Colonoscopy on 07/28/2018 revealed a 20 cm (length) frond-like/villous non-obstructing large mass in the cecum.  The mass was non-circumferential. No bleeding was present. The area was tattooed with an injection of Spot (carbon black). The appendiceal orifice appeared normal. It was unclear if the mass was originating from the cecum itself or if the ileocecal valve was involved.  Due to the large size of the mass, the ileocecal valve or terminal ileum could not be examined.  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  There was no high grade dysplasia or malignancy.  CEA was 2.8 on 08/04/2018.  He has B12 deficiency.  B12 was 244 (low) and folate 9.1 on 07/03/2018.  TSH was  normal.  He has chronic renal insufficiency.  Creatinine has ranged between 1.33 - 1.94 over the past 5 years.  Creatinine was 1.66 on 07/05/2018.   Symptomatically, he has lost 16 pounds unintentionally over the last 6 months.  He described a period  of "heavy" emesis 4-5 months ago, which has resolved.  He has melena and hematochezia.  Exam is unremarkable.  Plan: 1.   Labs today:  CBC with diff, ferritin, anti-parietal antibody, intrinsic factor antibody. 2.   Iron deficiency anemia:  Etiology felt secondary to colonic mass.  Discuss increasing oral iron to BID with OJ or vitamin C.  Preauth Venofer.  Follow-up with Dr. Tama High regarding goal hemoglobin. 3.   B12 deficiency:  Discuss initiating oral B12 1000 mcg po q day.  r/o pernicious anemia: anti-parietal antibody and intrinsic factor antibody.  Check B12 level in 1 month.    Discuss plan for B12 injections if level does not improve on oral B12. 4.   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  Patient has follow-up with Dr. Rosana Hoes today to discuss surgical resection.  Await final pathology from surgical resection. 5.   Renal insufficiency:  Discuss baseline renal insufficiency.  Discuss need to follow-up with nephrology. 6.   RTC in 1 month for MD assessment, labs (CBC with diff, BMP, ferritin, B12), and +/- Venofer.   Honor Loh, NP  08/04/2018, 9:48 AM   I saw and evaluated the patient, participating in the key portions of the service and reviewing pertinent diagnostic studies and records.  I reviewed the nurse practitioner's note and agree with the findings and the plan.  The assessment and plan were discussed with the patient. Multiple questions were asked by the patient and answered.   Nolon Stalls, MD 08/04/2018,9:48 AM

## 2018-08-04 ENCOUNTER — Ambulatory Visit (INDEPENDENT_AMBULATORY_CARE_PROVIDER_SITE_OTHER): Payer: Self-pay | Admitting: Surgery

## 2018-08-04 ENCOUNTER — Encounter: Payer: Self-pay | Admitting: Surgery

## 2018-08-04 ENCOUNTER — Other Ambulatory Visit: Payer: Self-pay

## 2018-08-04 ENCOUNTER — Inpatient Hospital Stay: Payer: Self-pay | Attending: Hematology and Oncology | Admitting: Hematology and Oncology

## 2018-08-04 ENCOUNTER — Inpatient Hospital Stay: Payer: Self-pay

## 2018-08-04 ENCOUNTER — Telehealth: Payer: Self-pay

## 2018-08-04 ENCOUNTER — Encounter: Payer: Self-pay | Admitting: Hematology and Oncology

## 2018-08-04 ENCOUNTER — Other Ambulatory Visit
Admission: RE | Admit: 2018-08-04 | Discharge: 2018-08-04 | Disposition: A | Discharge status: Home / Self Care | Payer: Self-pay | Source: Ambulatory Visit | Attending: Surgery | Admitting: Surgery

## 2018-08-04 VITALS — BP 138/95 | HR 60 | Temp 97.7°F | Ht 68.0 in | Wt 183.0 lb

## 2018-08-04 VITALS — BP 136/91 | HR 67 | Temp 98.3°F | Resp 18 | Ht 68.0 in | Wt 189.2 lb

## 2018-08-04 DIAGNOSIS — D490 Neoplasm of unspecified behavior of digestive system: Secondary | ICD-10-CM

## 2018-08-04 DIAGNOSIS — D508 Other iron deficiency anemias: Secondary | ICD-10-CM | POA: Insufficient documentation

## 2018-08-04 DIAGNOSIS — D509 Iron deficiency anemia, unspecified: Secondary | ICD-10-CM

## 2018-08-04 DIAGNOSIS — R531 Weakness: Secondary | ICD-10-CM | POA: Insufficient documentation

## 2018-08-04 DIAGNOSIS — K921 Melena: Secondary | ICD-10-CM | POA: Insufficient documentation

## 2018-08-04 DIAGNOSIS — K6389 Other specified diseases of intestine: Secondary | ICD-10-CM | POA: Insufficient documentation

## 2018-08-04 DIAGNOSIS — K319 Disease of stomach and duodenum, unspecified: Secondary | ICD-10-CM

## 2018-08-04 DIAGNOSIS — Z79899 Other long term (current) drug therapy: Secondary | ICD-10-CM | POA: Insufficient documentation

## 2018-08-04 DIAGNOSIS — K317 Polyp of stomach and duodenum: Secondary | ICD-10-CM | POA: Insufficient documentation

## 2018-08-04 DIAGNOSIS — E538 Deficiency of other specified B group vitamins: Secondary | ICD-10-CM

## 2018-08-04 DIAGNOSIS — K649 Unspecified hemorrhoids: Secondary | ICD-10-CM | POA: Insufficient documentation

## 2018-08-04 DIAGNOSIS — I10 Essential (primary) hypertension: Secondary | ICD-10-CM | POA: Insufficient documentation

## 2018-08-04 DIAGNOSIS — R42 Dizziness and giddiness: Secondary | ICD-10-CM | POA: Insufficient documentation

## 2018-08-04 DIAGNOSIS — N189 Chronic kidney disease, unspecified: Secondary | ICD-10-CM | POA: Insufficient documentation

## 2018-08-04 DIAGNOSIS — Z800 Family history of malignant neoplasm of digestive organs: Secondary | ICD-10-CM | POA: Insufficient documentation

## 2018-08-04 LAB — CBC WITH DIFFERENTIAL/PLATELET
Basophils Absolute: 0.1 10*3/uL (ref 0–0.1)
Basophils Relative: 2 %
EOS ABS: 0.2 10*3/uL (ref 0–0.7)
Eosinophils Relative: 4 %
HCT: 34.2 % — ABNORMAL LOW (ref 40.0–52.0)
HEMOGLOBIN: 9.8 g/dL — AB (ref 13.0–18.0)
LYMPHS ABS: 1.7 10*3/uL (ref 1.0–3.6)
Lymphocytes Relative: 35 %
MCH: 19.6 pg — ABNORMAL LOW (ref 26.0–34.0)
MCHC: 28.7 g/dL — ABNORMAL LOW (ref 32.0–36.0)
MCV: 68.3 fL — ABNORMAL LOW (ref 80.0–100.0)
Monocytes Absolute: 0.3 10*3/uL (ref 0.2–1.0)
Monocytes Relative: 6 %
NEUTROS PCT: 53 %
Neutro Abs: 2.7 10*3/uL (ref 1.4–6.5)
Platelets: 158 10*3/uL (ref 150–400)
RBC: 5 MIL/uL (ref 4.40–5.90)
RDW: 29 % — ABNORMAL HIGH (ref 11.5–14.5)
WBC: 5 10*3/uL (ref 3.8–10.6)

## 2018-08-04 LAB — FERRITIN: Ferritin: 11 ng/mL — ABNORMAL LOW (ref 24–336)

## 2018-08-04 MED ORDER — NEOMYCIN SULFATE 500 MG PO TABS: 500.0000 mg | ORAL_TABLET | Freq: Three times a day (TID) | ORAL | 0 refills | Status: DC

## 2018-08-04 MED ORDER — POLYETHYLENE GLYCOL 3350 17 GM/SCOOP PO POWD: 1.0000 | Freq: Once | ORAL | 0 refills | Status: AC

## 2018-08-04 MED ORDER — ERYTHROMYCIN BASE 500 MG PO TABS: 500.0000 mg | ORAL_TABLET | Freq: Three times a day (TID) | ORAL | 0 refills | Status: AC

## 2018-08-04 MED ORDER — BISACODYL 5 MG PO TBEC: 5.0000 mg | DELAYED_RELEASE_TABLET | Freq: Once | ORAL | 0 refills | Status: AC

## 2018-08-04 NOTE — Patient Instructions (Addendum)
We have seen you today to speak about removing a portion of your damaged Colon through 1 main incision in your abdomen. This surgery will be done on __________________ at [facility] by Dr. Rosana Hoes. You will most likely be in the hospital 5-7 days for this surgery and recovery.  You will need to complete a bowel prep prior to your surgery, please see the information sheet provided today for your directions.  Please see the Pre-care sheet for the details about your scheduled surgery.   Open Colectomy An open colectomy is surgery to remove part or all of the large intestine (colon). This procedure may be used to treat several conditions, including:  Inflammation and infection of the colon (diverticulitis).  Tumors or masses in the colon.  Inflammatory bowel disease, such as Crohn disease or ulcerative colitis.  Bleeding from the colon.  Blockage or obstruction of the colon.  Tell a health care provider about:  Any allergies you have.  All medicines you are taking, including vitamins, herbs, eye drops, creams, and over-the-counter medicines.  Any problems you or family members have had with anesthetic medicines.  Any blood disorders you have.  Any surgeries you have had.  Any medical conditions you have.  Whether you are pregnant or may be pregnant.  Whether you smoke or use tobacco products. These can affect your body's reaction to anesthesia. What are the risks? Generally, this is a safe procedure. However, problems may occur, including:  Infection.  Bleeding.  Allergic reactions to medicines.  Damage to other structures or organs.  Pneumonia.  The incision opening up.  Tissues from inside the abdomen bulging through the incision (hernia).  Reopening of the colon where it was stitched or stapled together.  A blood clot forming in a vein and traveling to the lungs.  Future blockage of the small intestine from scar tissue.  What happens before the procedure? Staying  hydrated Follow instructions from your health care provider about hydration, which may include:  Up to 2 hours before the procedure - you may continue to drink clear liquids, such as water, clear fruit juice, black coffee, and plain tea.  Eating and drinking restrictions Follow instructions from your health care provider about eating and drinking, which may include:  8 hours before the procedure - stop eating heavy meals or foods such as meat, fried foods, or fatty foods.  6 hours before the procedure - stop eating light meals or foods, such as toast or cereal.  6 hours before the procedure - stop drinking milk or drinks that contain milk.  2 hours before the procedure - stop drinking clear liquids.  Bowel prep In some cases, you may be prescribed an oral bowel prep to clean out your colon. If so:  Take it as told by your health care provider. Starting the day before your procedure, you may need to drink a large amount of medicated liquid. The liquid will cause you to have multiple loose stools until your stool is almost clear or light green.  Follow instructions from your health care provider about eating and drinking restrictions during bowel prep.  Medicines  Ask your health care provider about: ? Changing or stopping your regular medicines or vitamins. This is especially important if you are taking diabetes medicines, blood thinners, or vitamin E. ? Taking medicines such as aspirin and ibuprofen. These medicines can thin your blood. Do not take these medicines before your procedure if your health care provider instructs you not to.  If you  were prescribed an antibiotic medicine, take it as told by your health care provider. General instructions  Bring loose-fitting, comfortable clothing and slip-on shoes that you can put on without bending over.  Make sure to see your health care provider for any tests that you need before the procedure, such as: ? Blood tests. ? A test to  check the heart's rhythm (electrocardiogram, ECG). ? A CT scan of your abdomen. ? Urine tests. ? Colonoscopy.  Plan to have someone take you home from the hospital or clinic.  Arrange for someone to help you with your activities during your recovery. What happens during the procedure?  To reduce your risk of infection: ? Your health care team will wash or sanitize their hands. ? Your skin will be washed with soap. ? Hair may be removed from the surgical area.  An IV tube will be inserted into one of your veins. The tube will be used to give you medicines and fluids.  You will be given a medicine to make you fall asleep (general anesthetic). You may also be given a medicine to help you relax (sedative).  Small monitors will be connected to your body. They will be used to check your heart, blood pressure, and oxygen level.  A breathing tube may be placed into your lungs during the procedure.  A thin, flexible tube (catheter) will be placed into your bladder to drain urine.  A tube may be inserted through your nose and into your stomach (nasogastric tube, or NG tube). The tube is used to remove stomach fluids after surgery until the intestines start working again.  An incision will be made in your abdomen.  Clamps or staples will be put on your colon.  The part of the colon between the clamps or staples will be removed.  The ends of the colon that remain will be stitched or stapled together.  The incision in your abdomen will be closed with stitches (sutures) or staples.  The incision will be covered with a bandage (dressing).  A small opening (stoma) may be created in your lower abdomen. A removable, external pouch (ostomy pouch) will be attached to the stoma. This pouch will collect stool outside of your body. Stool passes through the stoma and into the pouch instead of through your anus. The procedure may vary among health care providers and hospitals. What happens after the  procedure?  Your blood pressure, heart rate, breathing rate, and blood oxygen level will be monitored until the medicines you were given have worn off.  You may continue to receive fluids and medicines through an IV tube.  You will start on a clear liquid diet and gradually go back to a normal diet.  Do not drive until your health care provider approves.  You may have some pain in your abdomen. You will be given pain medicine to control the pain.  You will be encouraged to do the following: ? Do breathing exercises to prevent pneumonia. ? Get up and start walking within a day after surgery. You should try to get up 5-6 times a day. This information is not intended to replace advice given to you by your health care provider. Make sure you discuss any questions you have with your health care provider. Document Released: 08/16/2009 Document Revised: 07/20/2016 Document Reviewed: 07/20/2016 Elsevier Interactive Patient Education  2018 [employer].            Open Colectomy, Care After This sheet gives you information about how to  care for yourself after your procedure. Your health care provider may also give you more specific instructions. If you have problems or questions, contact your health care provider. What can I expect after the procedure? After the procedure, it is common to have:  Pain in your abdomen, especially along your incision.  Tiredness. Your energy level will return to normal over the next several weeks.  Constipation.  Nausea.  Difficulty urinating.  Follow these instructions at home: Activity  You may be able to return to most of your normal activities within 1-2 weeks, such as working, walking up stairs, and sexual activity.  Avoid activities that require a lot of energy for 4-6 weeks after surgery, such as running, climbing, and lifting heavy objects. Ask your health care provider what activities are safe for you.  Take rest breaks during the day  as needed.  Do not drive for 1-2 weeks or until your health care provider says that it is safe.  Do not drive or use heavy machinery while taking prescription pain medicines.  Do not lift anything that is heavier than 10 lb (4.3 kg) until your health care provider says that it is safe. Incision care  Follow instructions from your health care provider about how to take care of your incision. Make sure you: ? Wash your hands with soap and water before you change your bandage (dressing). If soap and water are not available, use hand sanitizer. ? Change your dressing as told by your health care provider. ? Leave stitches (sutures) or staples in place. These skin closures may need to stay in place for 2 weeks or longer.  Avoid wearing tight clothing around your incision.  Protect your incision area from the sun.  Check your incision area every day for signs of infection. Check for: ? More redness, swelling, or pain. ? More fluid or blood. ? Warmth. ? Pus or a bad smell. General instructions  Do not take baths, swim, or use a hot tub until your health care provider approves. Ask your health care provider when you may shower.  Take over-the-counter and prescription medicines, including stool softeners, only as told by your health care provider.  Eat a low-fat and low-fiber diet for the first 4 weeks after surgery.  Keep all follow-up visits as told by your health care provider. This is important. Contact a health care provider if:  You have more redness, swelling, or pain around your incision.  You have more fluid or blood coming from your incision.  Your incision feels warm to the touch.  You have pus or a bad smell coming from your incision.  You have a fever or chills.  You do not have a bowel movement 2-3 days after surgery.  You cannot eat or drink for 24 hours or more.  You have persistent nausea and vomiting.  You have abdominal pain that gets worse and does not get  better with medicine. Get help right away if:  You have chest pain.  You have shortness of breath.  You have pain or swelling in your legs.  Your incision breaks open after your sutures or staples have been removed.  You have bleeding from the rectum. This information is not intended to replace advice given to you by your health care provider. Make sure you discuss any questions you have with your health care provider. Document Released: 05/12/2011 Document Revised: 07/20/2016 Document Reviewed: 07/20/2016 Elsevier Interactive Patient Education  2018 [employer] What is this  medicine? DOCUSATE (doc CUE sayt) is stool softener. It helps prevent constipation and straining or discomfort associated with hard or dry stools. This medicine may be used for other purposes; ask your health care provider or pharmacist if you have questions. COMMON BRAND NAME(S): Colace, Colace Clear, Correctol, D.O.S., DC, Doc-Q-Lace, DocuLace, Docusoft S, DOK, DOK Extra Strength, Dulcolax, Genasoft, Kao-Tin, Kaopectate Liqui-Gels, Phillips Stool Softener, Stool Softener, Stool Softner DC, Sulfolax, Sur-Q-Lax, Surfak, Uni-Ease What should I tell my health care provider before I take this medicine? They need to know if you have any of these conditions: -nausea or vomiting -severe constipation -stomach pain -sudden change in bowel habit lasting more than 2 weeks -an unusual or allergic reaction to docusate, other medicines, foods, dyes, or preservatives -pregnant or trying to get pregnant -breast-feeding How should I use this medicine? Take this medicine by mouth with a glass of water. Follow the directions on the label. Take your doses at regular intervals. Do not take your medicine more often than directed. Talk to your pediatrician regarding the use of this medicine in children. While this medicine may be prescribed for children as young as 2 years for selected conditions, precautions do  apply. Overdosage: If you think you have taken too much of this medicine contact a poison control center or emergency room at once. NOTE: This medicine is only for you. Do not share this medicine with others. What if I miss a dose? If you miss a dose, take it as soon as you can. If it is almost time for your next dose, take only that dose. Do not take double or extra doses. What may interact with this medicine? -mineral oil This list may not describe all possible interactions. Give your health care provider a list of all the medicines, herbs, non-prescription drugs, or dietary supplements you use. Also tell them if you smoke, drink alcohol, or use illegal drugs. Some items may interact with your medicine. What should I watch for while using this medicine? Do not use for more than one week without advice from your doctor or health care professional. If your constipation returns, check with your doctor or health care professional. Drink plenty of water while taking this medicine. Drinking water helps decrease constipation. Stop using this medicine and contact your doctor or health care professional if you experience any rectal bleeding or do not have a bowel movement after use. These could be signs of a more serious condition. What side effects may I notice from receiving this medicine? Side effects that you should report to your doctor or health care professional as soon as possible: -allergic reactions like skin rash, itching or hives, swelling of the face, lips, or tongue Side effects that usually do not require medical attention (report to your doctor or health care professional if they continue or are bothersome): -diarrhea -stomach cramps -throat irritation This list may not describe all possible side effects. Call your doctor for medical advice about side effects. You may report side effects to FDA at 1-800-FDA-1088. Where should I keep my medicine? Keep out of the reach of children. Store  at room temperature between 15 and 30 degrees C (59 and 86 degrees F). Throw away any unused medicine after the expiration date. NOTE: This sheet is a summary. It may not cover all possible information. If you have questions about this medicine, talk to your doctor, pharmacist, or health care provider.  2018 Elsevier/Gold Standard (2008-02-09 15:56:49)

## 2018-08-04 NOTE — Progress Notes (Signed)
Surgical Clinic History and Physical  Referring provider:  Virgel Manifold, MD [place], [place] [address]  HISTORY OF PRESENT ILLNESS (HPI):  37 y.o. male initially scheduled to present for evaluation of increasingly symptomatic Right inguinal hernia presents today foremost for evaluation of recently diagnosed large cecal mass. Patient reports he's struggled with constipation all his life, for which he only drinks apple juice and drinks coffee. However, over the past 3 months, patient describes he's also developed RLQ abdominal and Right flank pain, 30 lbs of unintentional weight loss, and persistent blood per rectum, which prompted recent colonoscopy. He denies any known family history of colon cancer. Regarding his Right inguinal hernia, he describes a Right groin bulge associated with periodic mild pain, exacerbated by heavy lifting, coughing, and straining for BM's with aforementioned chronic constipation. Patient otherwise denies any fever/chills, N/V, CP, or SOB, denies recent cocaine use.  PAST MEDICAL HISTORY (PMH):  Past Medical History:  Diagnosis Date  . GERD (gastroesophageal reflux disease)   . Hypertension   . Known health problems: none      PAST SURGICAL HISTORY ([place]):  Past Surgical History:  Procedure Laterality Date  . COLONOSCOPY WITH PROPOFOL N/A 07/28/2018   Procedure: COLONOSCOPY WITH PROPOFOL;  Surgeon: Virgel Manifold, MD;  Location: [facility] ENDOSCOPY;  Service: Endoscopy;  Laterality: N/A;  . ESOPHAGOGASTRODUODENOSCOPY (EGD) WITH PROPOFOL N/A 07/28/2018   Procedure: ESOPHAGOGASTRODUODENOSCOPY (EGD) WITH PROPOFOL;  Surgeon: Virgel Manifold, MD;  Location: [facility] ENDOSCOPY;  Service: Endoscopy;  Laterality: N/A;  . none       MEDICATIONS:  Prior to Admission medications   Medication Sig Start Date End Date Taking? Authorizing Provider  amLODipine (NORVASC) 10 MG tablet Take 1 tablet (10 mg total) by mouth daily. 09/09/16  Yes Epifanio Lesches, MD  pantoprazole (PROTONIX) 40 MG tablet Take 1 tablet (40 mg total) by mouth 2 (two) times daily. 07/05/18  Yes Mayo, Pete Pelt, MD  bisacodyl (DULCOLAX) 5 MG EC tablet Take 1 tablet (5 mg total) by mouth once for 1 dose. At 8:00 AM, take 4 tablets. 08/04/18 08/04/18  Vickie Epley, MD  erythromycin base (E-MYCIN) 500 MG tablet Take 1 tablet (500 mg total) by mouth 3 (three) times daily for 1 day. Take 2 tablets at 8 AM, 2 PM and 8 PM the day before surgery. 08/04/18 08/05/18  Vickie Epley, MD  neomycin (MYCIFRADIN) 500 MG tablet Take 1 tablet (500 mg total) by mouth 3 (three) times daily. Take 2 tablets at 8:00 AM, take 2 tablets at 2:00 PM and 8:00 PM. 08/04/18   Vickie Epley, MD  polyethylene glycol powder (GLYCOLAX/MIRALAX) powder Take 255 g by mouth once for 1 dose. At 2 PM, start Miralax prep every 15-20 minutes until all is gone. 08/04/18 08/04/18  Vickie Epley, MD     ALLERGIES:  No Known Allergies   SOCIAL HISTORY:  Social History   Socioeconomic History  . Marital status: Single    Spouse name: Not on file  . Number of children: Not on file  . Years of education: Not on file  . Highest education level: Not on file  Occupational History  . Not on file  Social Needs  . Financial resource strain: Not on file  . Food insecurity:    Worry: Not on file    Inability: Not on file  . Transportation needs:    Medical: Not on file    Non-medical: Not on file  Tobacco Use  .  Smoking status: Never Smoker  . Smokeless tobacco: Never Used  Substance and Sexual Activity  . Alcohol use: No    Comment: liquor  . Drug use: Not Currently    Types: Marijuana  . Sexual activity: Not Currently  Lifestyle  . Physical activity:    Days per week: Not on file    Minutes per session: Not on file  . Stress: Not on file  Relationships  . Social connections:    Talks on phone: Not on file    Gets together: Not on file    Attends religious service: Not on file     Active member of club or organization: Not on file    Attends meetings of clubs or organizations: Not on file    Relationship status: Not on file  . Intimate partner violence:    Fear of current or ex partner: Not on file    Emotionally abused: Not on file    Physically abused: Not on file    Forced sexual activity: Not on file  Other Topics Concern  . Not on file  Social History Narrative  . Not on file    The patient currently resides (home / rehab facility / nursing home): Home The patient normally is (ambulatory / bedbound): Ambulatory  FAMILY HISTORY:  Family History  Problem Relation Age of Onset  . CAD Maternal Grandmother   . Cancer Maternal Grandmother   . Cancer Maternal Grandfather   . Diabetes Mother   . Hypertension Father     Otherwise negative/non-contributory.  REVIEW OF SYSTEMS:  Constitutional: denies any other weight loss, fever, chills, or sweats  Eyes: denies any other vision changes, history of eye injury  ENT: denies sore throat, hearing problems  Respiratory: denies shortness of breath, wheezing  Cardiovascular: denies chest pain, palpitations  Gastrointestinal: abdominal pain, N/V, and bowel function as per HPI Musculoskeletal: denies any other joint pains or cramps  Skin: Denies any other rashes or skin discolorations Neurological: denies any other headache, dizziness, weakness  Psychiatric: Denies any other depression, anxiety   All other review of systems were otherwise negative   VITAL SIGNS:  BP (!) 138/95   Pulse 60   Temp 97.7 F (36.5 C) (Skin)   Ht 5\' 8"  (1.727 m)   Wt 183 lb (83 kg)   BMI 27.83 kg/m    PHYSICAL EXAM:  Constitutional:  -- Normal body habitus  -- Awake, alert, and oriented x3  Eyes:  -- Pupils equally round and reactive to light  -- No scleral icterus  Ear, nose, throat:  -- No jugular venous distension -- No nasal drainage, bleeding Pulmonary:  -- No crackles  -- Equal breath sounds bilaterally --  Breathing non-labored at rest Cardiovascular:  -- S1, S2 present  -- No pericardial rubs  Gastrointestinal:  -- Abdomen soft and non-distended with moderate RLQ abdominal and Right flank tenderness to palpation, no guarding/rebound tenderness  -- Easily reducible mildly-/moderately- tender to palpation Right inguinal bulge (hernia), no appreciable Left inguinal hernia or tenderness to palpation -- No other abdominal masses appreciated, pulsatile or otherwise  Musculoskeletal and Integumentary:  -- Wounds or skin discoloration: None appreciated -- Extremities: B/L UE and LE FROM, hands and feet warm, no edema  Neurologic:  -- Motor function: Intact and symmetric -- Sensation: Intact and symmetric  Labs:  CBC Latest Ref Rng & Units 08/04/2018 07/05/2018 07/05/2018  WBC 3.8 - 10.6 K/uL 5.0 9.5 8.4  Hemoglobin 13.0 - 18.0 g/dL 9.8(L) 7.8(L) 7.5(L)  Hematocrit 40.0 - 52.0 % 34.2(L) 28.2(L) 26.5(L)  Platelets 150 - 400 K/uL 158 192 181   CMP Latest Ref Rng & Units 07/05/2018 07/05/2018 07/04/2018  Glucose 70 - 99 mg/dL 92 83 87  BUN 6 - 20 mg/dL 6 5(L) 10  Creatinine 0.61 - 1.24 mg/dL 1.66(H) 1.33(H) 1.47(H)  Sodium 135 - 145 mmol/L 138 139 138  Potassium 3.5 - 5.1 mmol/L 3.6 3.9 3.4(L)  Chloride 98 - 111 mmol/L 110 112(H) 107  CO2 22 - 32 mmol/L 24 23 26   Calcium 8.9 - 10.3 mg/dL 8.6(L) 8.3(L) 8.4(L)  Total Protein 6.5 - 8.1 g/dL 7.4 - -  Total Bilirubin 0.3 - 1.2 mg/dL 0.6 - -  Alkaline Phos 38 - 126 U/L 51 - -  AST 15 - 41 U/L 17 - -  ALT 0 - 44 U/L 12 - -    Imaging studies:  Colonoscopy Enriqueta Shutter, Aug 14, 2018) A frond-like/villous non-obstructing large mass was found in the cecum. The mass was non-circumferential. The mass measured twenty cm in length. No bleeding was present. This was biopsied with a cold forceps for histology. The biopsy were obtained from the top of the mass and not the base to not cause scarring in the area and affect future interventions. Area was tattooed with  an injection of Spot (carbon black). The tattoo was placed one fold distal to the mass.  The appendiceal orifice appeared normal. It is unclear if the mass is originating from the cecum itself or if the ileocecal valve is involved. Due to the large size of the mass with a large mass the ileocecal valve or terminal ileum could not be examined.  Pathology (Aug 14, 2018) A. SMALL BOWEL; COLD BIOPSY:  - SMALL INTESTINAL MUCOSA WITH INTACT VILLI.  - NEGATIVE FOR ACTIVE INFLAMMATION, INTRAEPITHELIAL LYMPHOCYTOSIS, AND  INFECTIOUS AGENTS.   B. STOMACH, ERYTHEMA; COLD BIOPSY:  - MILD REACTIVE GASTROPATHY WITH MINIMAL CHRONIC INACTIVE INFLAMMATION.  - NEGATIVE FOR H. PYLORI, INTESTINAL METAPLASIA, DYSPLASIA, AND  MALIGNANCY.   C. STOMACH POLYP; COLD BIOPSY:  - FUNDIC GLAND POLYP.  - NEGATIVE FOR DYSPLASIA AND MALIGNANCY.   D. GASTROESOPHAGEAL JUNCTION; COLD BIOPSY:  - SQUAMOCOLUMNAR MUCOSA WITH MILD CHRONIC INFLAMMATION.  - NEGATIVE FOR GOBLET CELLS, DYSPLASIA, AND MALIGNANCY.   E. COLON MASS, CECUM; COLD BIOPSY:  - VILLOUS ADENOMA, 3 FRAGMENTS.  - NEGATIVE FOR HIGH-GRADE DYSPLASIA AND MALIGNANCY IN THIS SAMPLE  The biopsies from the cecal mass (E) are superficial samples from a  larger lesion which requires complete removal. A more serious process  cannot be excluded. Additional deeper sections were reviewed.  Assessment/Plan:  37 y.o. male with large bleeding Right cecal mass, concerning for colon cancer and complicated by co-morbidities including HTN, GERD, cocaine abuse, and chronic ongoing tobacco abuse (smoking).   - check CEA and follow up staging CT abdomen and pelvis scheduled for Monday  - all risks, benefits, and alternatives to Right colectomy were discussed with the patient and his mom, all of their questions were answered to their expressed satisfaction, patient expresses he wishes to proceed, and informed consent was obtained.  - will plan for Right colectomy to follow  pending staging CT results, refrain from cocaine, particularly prior to upcoming surgery  - return to clinic next week to discuss results of CT, anticipate return to clinic 2 weeks following above planned procedure  - control HTN to reduce risk of worsening CKD and avoid cocaine  - discussed future repair of Right inguinal hernia with mesh  - instructed to  call if any questions or concerns  All of the above recommendations were discussed with the patient and patient's family, and all of patient's and family's questions were answered to their expressed satisfaction.  Thank you for the opportunity to participate in this patient's care.  -- Marilynne Drivers Rosana Hoes, MD, [place]: [facility] - Partnering for exceptional care. Office: [phone]

## 2018-08-04 NOTE — Telephone Encounter (Signed)
-----   Message from Virgel Manifold, MD sent at 07/28/2018 12:13 PM EDT ----- Please refer patient to [place] [facility] for large cecal mass on colonoscopy today. I have informed pt we are placing referral.

## 2018-08-04 NOTE — Telephone Encounter (Signed)
Pt is being seen by Dr. Rosana Hoes today for hernia (previous referral) and also for the large cecal mass.

## 2018-08-04 NOTE — Progress Notes (Signed)
Patient here today as new evaluation regarding anemia due to blood loss.  Referred by Dr. Eloise Levels.

## 2018-08-05 ENCOUNTER — Encounter: Payer: Self-pay | Admitting: Surgery

## 2018-08-05 LAB — INTRINSIC FACTOR ANTIBODIES: INTRINSIC FACTOR: 0.9 [AU]/ml (ref 0.0–1.1)

## 2018-08-05 LAB — ANTI-PARIETAL ANTIBODY: Parietal Cell Antibody-IgG: 1 Units (ref 0.0–20.0)

## 2018-08-05 LAB — CEA: CEA: 2.8 ng/mL (ref 0.0–4.7)

## 2018-08-08 ENCOUNTER — Ambulatory Visit
Admission: RE | Admit: 2018-08-08 | Discharge: 2018-08-08 | Disposition: A | Discharge status: Home / Self Care | Payer: Self-pay | Source: Ambulatory Visit | Attending: Surgery | Admitting: Surgery

## 2018-08-08 DIAGNOSIS — D490 Neoplasm of unspecified behavior of digestive system: Secondary | ICD-10-CM | POA: Insufficient documentation

## 2018-08-08 HISTORY — DX: Malignant (primary) neoplasm, unspecified: C80.1

## 2018-08-08 MED ORDER — IOPAMIDOL (ISOVUE-300) INJECTION 61%
100.0000 mL | Freq: Once | INTRAVENOUS | Status: AC | PRN
  Administered 2018-08-08: 100 mL via INTRAVENOUS

## 2018-08-09 ENCOUNTER — Ambulatory Visit (INDEPENDENT_AMBULATORY_CARE_PROVIDER_SITE_OTHER): Payer: Self-pay | Admitting: Surgery

## 2018-08-09 ENCOUNTER — Encounter: Payer: Self-pay | Admitting: Surgery

## 2018-08-09 ENCOUNTER — Telehealth: Payer: Self-pay

## 2018-08-09 VITALS — BP 151/110 | HR 65 | Temp 97.3°F | Resp 18 | Ht 68.0 in | Wt 185.4 lb

## 2018-08-09 DIAGNOSIS — D374 Neoplasm of uncertain behavior of colon: Secondary | ICD-10-CM

## 2018-08-09 MED ORDER — NEOMYCIN SULFATE 500 MG PO TABS: 500.0000 mg | ORAL_TABLET | ORAL | 0 refills | Status: DC

## 2018-08-09 MED ORDER — ERYTHROMYCIN BASE 500 MG PO TABS: 500.0000 mg | ORAL_TABLET | ORAL | 0 refills | Status: DC

## 2018-08-09 MED ORDER — BISACODYL 5 MG PO TBEC: DELAYED_RELEASE_TABLET | ORAL | 0 refills | Status: DC

## 2018-08-09 NOTE — Patient Instructions (Addendum)
Please schedule Right Colectomy with Dr.Davis as soon as possible.  The patient is scheduled with Dr Rosana Hoes for surgery at [facility] on  08/15/18. He will pre admit by phone. He has Miralax and some antibiotics. Rest of antibiotics and Dulcolax for bowel prep sent into his pharmacy. Surgery bowel prep instructions reviewed. The patent is aware of date and instructions.

## 2018-08-09 NOTE — Telephone Encounter (Signed)
The patient is scheduled for surgery at [facility] with Dr Rosana Hoes on 08/15/18. He will pre admit at the hospital on 08/12/18 at 8:00 am. The patient is aware of date and instructions.

## 2018-08-09 NOTE — Progress Notes (Signed)
Surgical Clinic Progress/Follow-up Note   HPI:  37 y.o. Male presents to clinic for follow-up evaluation, to discuss results of recent CT, and to schedule surgery. Patient reports he continues feel okay with constipation controlled with prescribed medications and hydration, denies N/V, fever/chills, CP, or SOB. He also affirms abstinence from cocaine since his last appointment.  Review of Systems:  Constitutional: denies any other weight loss, fever, chills, or sweats  Eyes: denies any other vision changes, history of eye injury  ENT: denies sore throat, hearing problems  Respiratory: denies shortness of breath, wheezing  Cardiovascular: denies chest pain, palpitations  Gastrointestinal: abdominal pain, N/V, and bowel function as per HPI Musculoskeletal: denies any other joint pains or cramps  Skin: Denies any other rashes or skin discolorations  Neurological: denies any other headache, dizziness, weakness  Psychiatric: denies any other depression, anxiety  All other review of systems: otherwise negative   Vital Signs:  BP (!) 151/110   Pulse 65   Temp (!) 97.3 F (36.3 C) (Temporal)   Resp 18   Ht 5\' 8"  (1.727 m)   Wt 185 lb 6.4 oz (84.1 kg)   SpO2 99%   BMI 28.19 kg/m    Physical Exam:  Constitutional:  -- Normal body habitus  -- Awake, alert, and oriented x3  Eyes:  -- Pupils equally round and reactive to light  -- No scleral icterus  Ear, nose, throat:  -- No jugular venous distension  -- No nasal drainage, bleeding Pulmonary:  -- No crackles -- Equal breath sounds bilaterally -- Breathing non-labored at rest Cardiovascular:  -- S1, S2 present  -- No pericardial rubs  Gastrointestinal:  -- Soft, nontender, non-distended, no guarding/rebound  -- No abdominal masses appreciated, pulsatile or otherwise  Musculoskeletal / Integumentary:  -- Wounds or skin discoloration:   -- Extremities: B/L UE and LE FROM, hands and feet warm, no edema  Neurologic:  -- Motor  function: intact and symmetric  -- Sensation: intact and symmetric   Laboratory studies:  CBC Latest Ref Rng & Units 08/04/2018 07/05/2018 07/05/2018  WBC 3.8 - 10.6 K/uL 5.0 9.5 8.4  Hemoglobin 13.0 - 18.0 g/dL 9.8(L) 7.8(L) 7.5(L)  Hematocrit 40.0 - 52.0 % 34.2(L) 28.2(L) 26.5(L)  Platelets 150 - 400 K/uL 158 192 181   CMP Latest Ref Rng & Units 07/05/2018 07/05/2018 07/04/2018  Glucose 70 - 99 mg/dL 92 83 87  BUN 6 - 20 mg/dL 6 5(L) 10  Creatinine 0.61 - 1.24 mg/dL 1.66(H) 1.33(H) 1.47(H)  Sodium 135 - 145 mmol/L 138 139 138  Potassium 3.5 - 5.1 mmol/L 3.6 3.9 3.4(L)  Chloride 98 - 111 mmol/L 110 112(H) 107  CO2 22 - 32 mmol/L 24 23 26   Calcium 8.9 - 10.3 mg/dL 8.6(L) 8.3(L) 8.4(L)  Total Protein 6.5 - 8.1 g/dL 7.4 - -  Total Bilirubin 0.3 - 1.2 mg/dL 0.6 - -  Alkaline Phos 38 - 126 U/L 51 - -  AST 15 - 41 U/L 17 - -  ALT 0 - 44 U/L 12 - -   CEA (08/04/2018): 2.8 (WNL)  Imaging:  CT Abdomen and Pelvis with Contrast (08/08/2018) - personally reviewed and discussed with patient and his family 1. 7.5 cm mass extending from the cecum into the lower ascending colon. 2. No CT evidence of extension of the mass beyond the colon wall. 3. No evidence of metastatic disease. 4. No acute abnormalities within the abdomen or pelvis.   Assessment:  37 y.o. yo Male with a problem list  including...  Patient Active Problem List   Diagnosis Date Noted  . Cecum mass   . Columnar-lined esophagus   . Stomach irritation   . Gastric polyp   . Colonic neoplasm   . Diverticulosis of large intestine without diverticulitis   . Iron deficiency anemia 07/23/2018  . B12 deficiency 07/23/2018  . GIB (gastrointestinal bleeding) 07/03/2018  . Pneumonia 09/07/2016    presents to clinic for large Right colon mass demonstrating villous adenoma on pathology without evidence of metastatic disease on CT imaging yesterday, complicated by comorbidities including HTN, CKD, GERD, cocaine abuse, and chronic ongoing  tobacco abuse (smoking).  Plan:   - discussed results of CT abdomen/pelvis  - follow bowel prep as per pre-colectomy protocol  - all risks, benefits, and alternatives to Right colectomy were discussed with the patient and his family, all of their questions were answered to their expressed satisfaction, patient expresses he wishes to proceed, and informed consent was obtained.  - will plan for Right colectomy pending anesthesia and OR availability  - BP control, abstain from cocaine, and smoking cessation encouraged  - anticipate return to clinic 2 weeks following above surgery  - instructed to call office if any questions or concerns  All of the above recommendations were discussed with the patient and patient's family, and all of patient's and family's questions were answered to their expressed satisfaction.  -- Marilynne Drivers Rosana Hoes, MD, [place]: [facility] - Partnering for exceptional care. Office: [phone]

## 2018-08-09 NOTE — H&P (View-Only) (Signed)
Surgical Clinic Progress/Follow-up Note   HPI:  37 y.o. Male presents to clinic for follow-up evaluation, to discuss results of recent CT, and to schedule surgery. Patient reports he continues feel okay with constipation controlled with prescribed medications and hydration, denies N/V, fever/chills, CP, or SOB. He also affirms abstinence from cocaine since his last appointment.  Review of Systems:  Constitutional: denies any other weight loss, fever, chills, or sweats  Eyes: denies any other vision changes, history of eye injury  ENT: denies sore throat, hearing problems  Respiratory: denies shortness of breath, wheezing  Cardiovascular: denies chest pain, palpitations  Gastrointestinal: abdominal pain, N/V, and bowel function as per HPI Musculoskeletal: denies any other joint pains or cramps  Skin: Denies any other rashes or skin discolorations  Neurological: denies any other headache, dizziness, weakness  Psychiatric: denies any other depression, anxiety  All other review of systems: otherwise negative   Vital Signs:  BP (!) 151/110   Pulse 65   Temp (!) 97.3 F (36.3 C) (Temporal)   Resp 18   Ht 5\' 8"  (1.727 m)   Wt 185 lb 6.4 oz (84.1 kg)   SpO2 99%   BMI 28.19 kg/m    Physical Exam:  Constitutional:  -- Normal body habitus  -- Awake, alert, and oriented x3  Eyes:  -- Pupils equally round and reactive to light  -- No scleral icterus  Ear, nose, throat:  -- No jugular venous distension  -- No nasal drainage, bleeding Pulmonary:  -- No crackles -- Equal breath sounds bilaterally -- Breathing non-labored at rest Cardiovascular:  -- S1, S2 present  -- No pericardial rubs  Gastrointestinal:  -- Soft, nontender, non-distended, no guarding/rebound  -- No abdominal masses appreciated, pulsatile or otherwise  Musculoskeletal / Integumentary:  -- Wounds or skin discoloration:   -- Extremities: B/L UE and LE FROM, hands and feet warm, no edema  Neurologic:  -- Motor  function: intact and symmetric  -- Sensation: intact and symmetric   Laboratory studies:  CBC Latest Ref Rng & Units 08/04/2018 07/05/2018 07/05/2018  WBC 3.8 - 10.6 K/uL 5.0 9.5 8.4  Hemoglobin 13.0 - 18.0 g/dL 9.8(L) 7.8(L) 7.5(L)  Hematocrit 40.0 - 52.0 % 34.2(L) 28.2(L) 26.5(L)  Platelets 150 - 400 K/uL 158 192 181   CMP Latest Ref Rng & Units 07/05/2018 07/05/2018 07/04/2018  Glucose 70 - 99 mg/dL 92 83 87  BUN 6 - 20 mg/dL 6 5(L) 10  Creatinine 0.61 - 1.24 mg/dL 1.66(H) 1.33(H) 1.47(H)  Sodium 135 - 145 mmol/L 138 139 138  Potassium 3.5 - 5.1 mmol/L 3.6 3.9 3.4(L)  Chloride 98 - 111 mmol/L 110 112(H) 107  CO2 22 - 32 mmol/L 24 23 26   Calcium 8.9 - 10.3 mg/dL 8.6(L) 8.3(L) 8.4(L)  Total Protein 6.5 - 8.1 g/dL 7.4 - -  Total Bilirubin 0.3 - 1.2 mg/dL 0.6 - -  Alkaline Phos 38 - 126 U/L 51 - -  AST 15 - 41 U/L 17 - -  ALT 0 - 44 U/L 12 - -   CEA (08/04/2018): 2.8 (WNL)  Imaging:  CT Abdomen and Pelvis with Contrast (08/08/2018) - personally reviewed and discussed with patient and his family 1. 7.5 cm mass extending from the cecum into the lower ascending colon. 2. No CT evidence of extension of the mass beyond the colon wall. 3. No evidence of metastatic disease. 4. No acute abnormalities within the abdomen or pelvis.   Assessment:  37 y.o. yo Male with a problem list  including...  Patient Active Problem List   Diagnosis Date Noted  . Cecum mass   . Columnar-lined esophagus   . Stomach irritation   . Gastric polyp   . Colonic neoplasm   . Diverticulosis of large intestine without diverticulitis   . Iron deficiency anemia 07/23/2018  . B12 deficiency 07/23/2018  . GIB (gastrointestinal bleeding) 07/03/2018  . Pneumonia 09/07/2016    presents to clinic for large Right colon mass demonstrating villous adenoma on pathology without evidence of metastatic disease on CT imaging yesterday, complicated by comorbidities including HTN, CKD, GERD, cocaine abuse, and chronic ongoing  tobacco abuse (smoking).  Plan:   - discussed results of CT abdomen/pelvis  - follow bowel prep as per pre-colectomy protocol  - all risks, benefits, and alternatives to Right colectomy were discussed with the patient and his family, all of their questions were answered to their expressed satisfaction, patient expresses he wishes to proceed, and informed consent was obtained.  - will plan for Right colectomy pending anesthesia and OR availability  - BP control, abstain from cocaine, and smoking cessation encouraged  - anticipate return to clinic 2 weeks following above surgery  - instructed to call office if any questions or concerns  All of the above recommendations were discussed with the patient and patient's family, and all of patient's and family's questions were answered to their expressed satisfaction.  -- Marilynne Drivers Rosana Hoes, MD, [place]: [facility] - Partnering for exceptional care. Office: [phone]

## 2018-08-12 ENCOUNTER — Encounter
Admission: RE | Admit: 2018-08-12 | Discharge: 2018-08-12 | Disposition: A | Discharge status: Home / Self Care | Payer: Self-pay | Source: Ambulatory Visit | Attending: Surgery | Admitting: Surgery

## 2018-08-12 ENCOUNTER — Other Ambulatory Visit: Payer: Self-pay

## 2018-08-12 DIAGNOSIS — D374 Neoplasm of uncertain behavior of colon: Secondary | ICD-10-CM | POA: Insufficient documentation

## 2018-08-12 DIAGNOSIS — Z01818 Encounter for other preprocedural examination: Secondary | ICD-10-CM | POA: Insufficient documentation

## 2018-08-12 HISTORY — DX: Anemia, unspecified: D64.9

## 2018-08-12 LAB — CBC WITH DIFFERENTIAL/PLATELET
Abs Immature Granulocytes: 0.02 10*3/uL (ref 0.00–0.07)
Basophils Absolute: 0.1 10*3/uL (ref 0.0–0.1)
Basophils Relative: 2 %
EOS ABS: 0.3 10*3/uL (ref 0.0–0.5)
EOS PCT: 5 %
HEMATOCRIT: 39.7 % (ref 39.0–52.0)
Hemoglobin: 10.7 g/dL — ABNORMAL LOW (ref 13.0–17.0)
Immature Granulocytes: 0 %
LYMPHS ABS: 2 10*3/uL (ref 0.7–4.0)
Lymphocytes Relative: 32 %
MCH: 20.3 pg — ABNORMAL LOW (ref 26.0–34.0)
MCHC: 27 g/dL — ABNORMAL LOW (ref 30.0–36.0)
MCV: 75.3 fL — AB (ref 80.0–100.0)
MONOS PCT: 8 %
Monocytes Absolute: 0.5 10*3/uL (ref 0.1–1.0)
NRBC: 0 % (ref 0.0–0.2)
Neutro Abs: 3.3 10*3/uL (ref 1.7–7.7)
Neutrophils Relative %: 53 %
Platelets: 220 10*3/uL (ref 150–400)
RBC: 5.27 MIL/uL (ref 4.22–5.81)
RDW: 28.4 % — AB (ref 11.5–15.5)
WBC: 6.1 10*3/uL (ref 4.0–10.5)

## 2018-08-12 LAB — COMPREHENSIVE METABOLIC PANEL
ALK PHOS: 50 U/L (ref 38–126)
ALT: 36 U/L (ref 0–44)
ANION GAP: 5 (ref 5–15)
AST: 29 U/L (ref 15–41)
Albumin: 3.9 g/dL (ref 3.5–5.0)
BILIRUBIN TOTAL: 0.3 mg/dL (ref 0.3–1.2)
BUN: 11 mg/dL (ref 6–20)
CALCIUM: 8.9 mg/dL (ref 8.9–10.3)
CO2: 27 mmol/L (ref 22–32)
Chloride: 106 mmol/L (ref 98–111)
Creatinine, Ser: 1.29 mg/dL — ABNORMAL HIGH (ref 0.61–1.24)
GFR calc Af Amer: >60 mL/min (ref >60)
Glucose, Bld: 88 mg/dL (ref 70–99)
POTASSIUM: 3.9 mmol/L (ref 3.5–5.1)
Sodium: 138 mmol/L (ref 135–145)
TOTAL PROTEIN: 7.3 g/dL (ref 6.5–8.1)

## 2018-08-12 LAB — TYPE AND SCREEN
ABO/RH(D): A POS
ANTIBODY SCREEN: NEGATIVE

## 2018-08-12 NOTE — Patient Instructions (Signed)
Your procedure is scheduled on:Mon. 10/14 Report to Day Surgery. To find out your arrival time please call [phone] between 1PM - 3PM on today   Remember: Instructions that are not followed completely may result in serious medical risk,  up to and including death, or upon the discretion of your surgeon and anesthesiologist your  surgery may need to be rescheduled.     _X__ 1. Do not eat food after midnight the night before your procedure.                 No gum chewing or hard candies. You may drink clear liquids up to 2 hours                 before you are scheduled to arrive for your surgery- DO not drink clear                 liquids within 2 hours of the start of your surgery.                 Clear Liquids include:  water, apple juice without pulp, clear carbohydrate                 drink such as Clearfast of Gatorade, Black Coffee or Tea (Do not add                 anything to coffee or tea).  __X__2.  On the morning of surgery brush your teeth with toothpaste and water, you                may rinse your mouth with mouthwash if you wish.  Do not swallow any toothpaste of mouthwash.     _X__ 3.  No Alcohol for 24 hours before or after surgery.   ___ 4.  Do Not Smoke or use e-cigarettes For 24 Hours Prior to Your Surgery.                 Do not use any chewable tobacco products for at least 6 hours prior to                 surgery.  ____  5.  Bring all medications with you on the day of surgery if instructed.   __x__  6.  Notify your doctor if there is any change in your medical condition      (cold, fever, infections).     Do not wear jewelry, make-up, hairpins, clips or nail polish. Do not wear lotions, powders, or perfumes. You may wear deodorant. Do not shave 48 hours prior to surgery. Men may shave face and neck. Do not bring valuables to the hospital.    [facility] is not responsible for any belongings or valuables.  Contacts, dentures or  bridgework may not be worn into surgery. Leave your suitcase in the car. After surgery it may be brought to your room. For patients admitted to the hospital, discharge time is determined by your treatment team.   Patients discharged the day of surgery will not be allowed to drive home.   Please read over the following fact sheets that you were given:     __x__ Take these medicines the morning of surgery with A SIP OF WATER:    1. pantoprazole (PROTONIX) 40 MG tablet  2.   3.   4.  5.  6.  ____ Fleet Enema (as directed)   __x__ Use CHG Soap as directed  ____ Use inhalers  on the day of surgery  ____ Stop metformin 2 days prior to surgery    ____ Take 1/2 of usual insulin dose the night before surgery. No insulin the morning          of surgery.   ____ Stop Coumadin/Plavix/aspirin on   ____ Stop Anti-inflammatories on    ____ Stop supplements until after surgery.    ____ Bring C-Pap to the hospital.   Follow bowel prep as instructed/ Clear Liquids

## 2018-08-13 LAB — CEA: CEA: 2.7 ng/mL (ref 0.0–4.7)

## 2018-08-14 MED ORDER — SODIUM CHLORIDE 0.9 % IV SOLN
2.0000 g | INTRAVENOUS | Status: AC
  Administered 2018-08-15: 2 g via INTRAVENOUS
  Filled 2018-08-14: qty 2

## 2018-08-15 ENCOUNTER — Other Ambulatory Visit: Payer: Self-pay

## 2018-08-15 ENCOUNTER — Inpatient Hospital Stay: Payer: Self-pay | Admitting: Anesthesiology

## 2018-08-15 ENCOUNTER — Encounter: Payer: Self-pay | Admitting: *Deleted

## 2018-08-15 ENCOUNTER — Encounter
Admission: RE | Disposition: A | Discharge status: Home / Self Care | Payer: Self-pay | Source: Ambulatory Visit | Attending: Surgery

## 2018-08-15 ENCOUNTER — Inpatient Hospital Stay
Admission: RE | Admit: 2018-08-15 | Discharge: 2018-08-18 | DRG: 331 | Disposition: A | Discharge status: Home / Self Care | Payer: Self-pay | Source: Ambulatory Visit | Attending: Surgery | Admitting: Surgery

## 2018-08-15 DIAGNOSIS — N189 Chronic kidney disease, unspecified: Secondary | ICD-10-CM | POA: Diagnosis present

## 2018-08-15 DIAGNOSIS — Z23 Encounter for immunization: Secondary | ICD-10-CM

## 2018-08-15 DIAGNOSIS — I129 Hypertensive chronic kidney disease with stage 1 through stage 4 chronic kidney disease, or unspecified chronic kidney disease: Secondary | ICD-10-CM | POA: Diagnosis present

## 2018-08-15 DIAGNOSIS — D120 Benign neoplasm of cecum: Principal | ICD-10-CM | POA: Diagnosis present

## 2018-08-15 DIAGNOSIS — F17200 Nicotine dependence, unspecified, uncomplicated: Secondary | ICD-10-CM | POA: Diagnosis present

## 2018-08-15 DIAGNOSIS — K6389 Other specified diseases of intestine: Secondary | ICD-10-CM

## 2018-08-15 DIAGNOSIS — F1410 Cocaine abuse, uncomplicated: Secondary | ICD-10-CM | POA: Diagnosis present

## 2018-08-15 DIAGNOSIS — I10 Essential (primary) hypertension: Secondary | ICD-10-CM | POA: Diagnosis present

## 2018-08-15 DIAGNOSIS — K219 Gastro-esophageal reflux disease without esophagitis: Secondary | ICD-10-CM | POA: Diagnosis present

## 2018-08-15 HISTORY — PX: PARTIAL COLECTOMY: SHX5273

## 2018-08-15 HISTORY — DX: Other specified diseases of intestine: K63.89

## 2018-08-15 LAB — URINE DRUG SCREEN, QUALITATIVE (ARMC ONLY)
Amphetamines, Ur Screen: NOT DETECTED
BENZODIAZEPINE, UR SCRN: NOT DETECTED
Barbiturates, Ur Screen: NOT DETECTED
Cannabinoid 50 Ng, Ur ~~LOC~~: NOT DETECTED
Cocaine Metabolite,Ur ~~LOC~~: NOT DETECTED
MDMA (Ecstasy)Ur Screen: NOT DETECTED
Methadone Scn, Ur: NOT DETECTED
Opiate, Ur Screen: NOT DETECTED
Phencyclidine (PCP) Ur S: NOT DETECTED
TRICYCLIC, UR SCREEN: NOT DETECTED

## 2018-08-15 SURGERY — COLECTOMY, PARTIAL
Anesthesia: General | Laterality: Right

## 2018-08-15 MED ORDER — PROPOFOL 10 MG/ML IV BOLUS
INTRAVENOUS | Status: DC | PRN
  Administered 2018-08-15: 160 mg via INTRAVENOUS

## 2018-08-15 MED ORDER — LACTATED RINGERS IV SOLN
INTRAVENOUS | Status: DC
  Administered 2018-08-15: 11:00:00 via INTRAVENOUS

## 2018-08-15 MED ORDER — MORPHINE SULFATE (PF) 2 MG/ML IV SOLN
2.0000 mg | INTRAVENOUS | Status: DC | PRN
  Administered 2018-08-15 – 2018-08-17 (×5): 2 mg via INTRAVENOUS
  Filled 2018-08-15 (×5): qty 1

## 2018-08-15 MED ORDER — CHLORHEXIDINE GLUCONATE CLOTH 2 % EX PADS: 6.0000 | MEDICATED_PAD | Freq: Once | CUTANEOUS | Status: DC

## 2018-08-15 MED ORDER — LIDOCAINE HCL (CARDIAC) PF 100 MG/5ML IV SOSY
PREFILLED_SYRINGE | INTRAVENOUS | Status: DC | PRN
  Administered 2018-08-15: 80 mg via INTRAVENOUS

## 2018-08-15 MED ORDER — ONDANSETRON HCL 4 MG/2ML IJ SOLN: 4.0000 mg | Freq: Four times a day (QID) | INTRAMUSCULAR | Status: DC | PRN

## 2018-08-15 MED ORDER — EPHEDRINE SULFATE 50 MG/ML IJ SOLN
INTRAMUSCULAR | Status: AC
  Filled 2018-08-15: qty 1

## 2018-08-15 MED ORDER — ONDANSETRON HCL 4 MG/2ML IJ SOLN
INTRAMUSCULAR | Status: DC | PRN
  Administered 2018-08-15: 4 mg via INTRAVENOUS

## 2018-08-15 MED ORDER — MIDAZOLAM HCL 2 MG/2ML IJ SOLN
INTRAMUSCULAR | Status: DC | PRN
  Administered 2018-08-15: 2 mg via INTRAVENOUS

## 2018-08-15 MED ORDER — OXYCODONE HCL 5 MG PO TABS
5.0000 mg | ORAL_TABLET | ORAL | Status: DC | PRN
  Administered 2018-08-16 – 2018-08-17 (×6): 10 mg via ORAL
  Filled 2018-08-15 (×6): qty 2

## 2018-08-15 MED ORDER — BUPIVACAINE LIPOSOME 1.3 % IJ SUSP
INTRAMUSCULAR | Status: DC | PRN
  Administered 2018-08-15: 50 mL

## 2018-08-15 MED ORDER — SUGAMMADEX SODIUM 200 MG/2ML IV SOLN
INTRAVENOUS | Status: DC | PRN
  Administered 2018-08-15: 167.8 mg via INTRAVENOUS

## 2018-08-15 MED ORDER — PROPOFOL 10 MG/ML IV BOLUS
INTRAVENOUS | Status: AC
  Filled 2018-08-15: qty 20

## 2018-08-15 MED ORDER — FENTANYL CITRATE (PF) 100 MCG/2ML IJ SOLN
25.0000 ug | INTRAMUSCULAR | Status: DC | PRN
  Administered 2018-08-15 (×4): 25 ug via INTRAVENOUS

## 2018-08-15 MED ORDER — ROCURONIUM BROMIDE 100 MG/10ML IV SOLN
INTRAVENOUS | Status: DC | PRN
  Administered 2018-08-15: 50 mg via INTRAVENOUS
  Administered 2018-08-15: 20 mg via INTRAVENOUS
  Administered 2018-08-15: 10 mg via INTRAVENOUS
  Administered 2018-08-15: 20 mg via INTRAVENOUS

## 2018-08-15 MED ORDER — ROCURONIUM BROMIDE 50 MG/5ML IV SOLN
INTRAVENOUS | Status: AC
  Filled 2018-08-15: qty 1

## 2018-08-15 MED ORDER — INFLUENZA VAC SPLIT QUAD 0.5 ML IM SUSY
0.5000 mL | PREFILLED_SYRINGE | INTRAMUSCULAR | Status: AC
  Administered 2018-08-16: 0.5 mL via INTRAMUSCULAR
  Filled 2018-08-15: qty 0.5

## 2018-08-15 MED ORDER — ONDANSETRON HCL 4 MG/2ML IJ SOLN
INTRAMUSCULAR | Status: AC
  Filled 2018-08-15: qty 2

## 2018-08-15 MED ORDER — PROMETHAZINE HCL 25 MG/ML IJ SOLN: 6.2500 mg | INTRAMUSCULAR | Status: DC | PRN

## 2018-08-15 MED ORDER — OXYCODONE HCL 5 MG/5ML PO SOLN: 5.0000 mg | Freq: Once | ORAL | Status: DC | PRN

## 2018-08-15 MED ORDER — ACETAMINOPHEN 500 MG PO TABS
1000.0000 mg | ORAL_TABLET | ORAL | Status: AC
  Administered 2018-08-15: 1000 mg via ORAL

## 2018-08-15 MED ORDER — DEXTROSE IN LACTATED RINGERS 5 % IV SOLN
INTRAVENOUS | Status: DC
  Administered 2018-08-15: 125 mL/h via INTRAVENOUS
  Administered 2018-08-16 – 2018-08-17 (×5): via INTRAVENOUS

## 2018-08-15 MED ORDER — SODIUM CHLORIDE 0.9 % IV SOLN
2.0000 g | Freq: Two times a day (BID) | INTRAVENOUS | Status: AC
  Administered 2018-08-16: 2 g via INTRAVENOUS
  Filled 2018-08-15: qty 2

## 2018-08-15 MED ORDER — FENTANYL CITRATE (PF) 100 MCG/2ML IJ SOLN
INTRAMUSCULAR | Status: AC
  Administered 2018-08-15: 25 ug via INTRAVENOUS
  Filled 2018-08-15: qty 2

## 2018-08-15 MED ORDER — FENTANYL CITRATE (PF) 100 MCG/2ML IJ SOLN
INTRAMUSCULAR | Status: AC
  Filled 2018-08-15: qty 2

## 2018-08-15 MED ORDER — ENOXAPARIN SODIUM 40 MG/0.4ML ~~LOC~~ SOLN
40.0000 mg | SUBCUTANEOUS | Status: DC
  Administered 2018-08-16 – 2018-08-18 (×3): 40 mg via SUBCUTANEOUS
  Filled 2018-08-15 (×3): qty 0.4

## 2018-08-15 MED ORDER — ALVIMOPAN 12 MG PO CAPS
12.0000 mg | ORAL_CAPSULE | ORAL | Status: AC
  Administered 2018-08-15: 12 mg via ORAL

## 2018-08-15 MED ORDER — DEXAMETHASONE SODIUM PHOSPHATE 10 MG/ML IJ SOLN
INTRAMUSCULAR | Status: DC | PRN
  Administered 2018-08-15: 10 mg via INTRAVENOUS

## 2018-08-15 MED ORDER — ALVIMOPAN 12 MG PO CAPS
12.0000 mg | ORAL_CAPSULE | Freq: Two times a day (BID) | ORAL | Status: DC
  Administered 2018-08-16 – 2018-08-18 (×4): 12 mg via ORAL
  Filled 2018-08-15 (×6): qty 1

## 2018-08-15 MED ORDER — GABAPENTIN 300 MG PO CAPS
300.0000 mg | ORAL_CAPSULE | ORAL | Status: AC
  Administered 2018-08-15: 300 mg via ORAL

## 2018-08-15 MED ORDER — ALVIMOPAN 12 MG PO CAPS
ORAL_CAPSULE | ORAL | Status: AC
  Filled 2018-08-15: qty 1

## 2018-08-15 MED ORDER — MEPERIDINE HCL 50 MG/ML IJ SOLN: 6.2500 mg | INTRAMUSCULAR | Status: DC | PRN

## 2018-08-15 MED ORDER — KETOROLAC TROMETHAMINE 15 MG/ML IJ SOLN
15.0000 mg | Freq: Four times a day (QID) | INTRAMUSCULAR | Status: DC | PRN
  Administered 2018-08-15 – 2018-08-16 (×2): 15 mg via INTRAVENOUS
  Filled 2018-08-15 (×2): qty 1

## 2018-08-15 MED ORDER — ONDANSETRON 4 MG PO TBDP: 4.0000 mg | ORAL_TABLET | Freq: Four times a day (QID) | ORAL | Status: DC | PRN

## 2018-08-15 MED ORDER — FENTANYL CITRATE (PF) 100 MCG/2ML IJ SOLN
INTRAMUSCULAR | Status: DC | PRN
  Administered 2018-08-15: 100 ug via INTRAVENOUS
  Administered 2018-08-15: 50 ug via INTRAVENOUS

## 2018-08-15 MED ORDER — OXYCODONE HCL 5 MG PO TABS: 5.0000 mg | ORAL_TABLET | Freq: Once | ORAL | Status: DC | PRN

## 2018-08-15 MED ORDER — ACETAMINOPHEN 500 MG PO TABS
1000.0000 mg | ORAL_TABLET | Freq: Four times a day (QID) | ORAL | Status: DC
  Administered 2018-08-15 – 2018-08-18 (×11): 1000 mg via ORAL
  Filled 2018-08-15 (×11): qty 2

## 2018-08-15 MED ORDER — GABAPENTIN 300 MG PO CAPS
ORAL_CAPSULE | ORAL | Status: AC
  Filled 2018-08-15: qty 1

## 2018-08-15 MED ORDER — EPHEDRINE SULFATE 50 MG/ML IJ SOLN
INTRAMUSCULAR | Status: DC | PRN
  Administered 2018-08-15 (×2): 10 mg via INTRAVENOUS

## 2018-08-15 MED ORDER — DEXAMETHASONE SODIUM PHOSPHATE 10 MG/ML IJ SOLN
INTRAMUSCULAR | Status: AC
  Filled 2018-08-15: qty 1

## 2018-08-15 MED ORDER — MIDAZOLAM HCL 2 MG/2ML IJ SOLN
INTRAMUSCULAR | Status: AC
  Filled 2018-08-15: qty 2

## 2018-08-15 MED ORDER — ACETAMINOPHEN 500 MG PO TABS
ORAL_TABLET | ORAL | Status: AC
  Administered 2018-08-15: 1000 mg via ORAL
  Filled 2018-08-15: qty 2

## 2018-08-15 MED ORDER — PHENYLEPHRINE HCL 10 MG/ML IJ SOLN
INTRAMUSCULAR | Status: DC | PRN
  Administered 2018-08-15 (×4): 100 ug via INTRAVENOUS

## 2018-08-15 SURGICAL SUPPLY — 44 items
APPLIER CLIP 11 MED OPEN (CLIP)
APPLIER CLIP 13 LRG OPEN (CLIP)
CHLORAPREP W/TINT 26ML (MISCELLANEOUS) ×3 IMPLANT
CLIP APPLIE 11 MED OPEN (CLIP) IMPLANT
CLIP APPLIE 13 LRG OPEN (CLIP) IMPLANT
COVER WAND RF STERILE (DRAPES) ×3 IMPLANT
DRSG OPSITE POSTOP 4X10 (GAUZE/BANDAGES/DRESSINGS) ×3 IMPLANT
DRSG OPSITE POSTOP 4X8 (GAUZE/BANDAGES/DRESSINGS) IMPLANT
ELECT BLADE 6 FLAT ULTRCLN (ELECTRODE) ×3 IMPLANT
ELECT REM PT RETURN 9FT ADLT (ELECTROSURGICAL) ×3
ELECTRODE REM PT RTRN 9FT ADLT (ELECTROSURGICAL) ×1 IMPLANT
EXTRT SYSTEM ALEXIS 17CM (MISCELLANEOUS) ×3
GLOVE BIO SURGEON STRL SZ7 (GLOVE) ×18 IMPLANT
GLOVE BIOGEL PI IND STRL 7.5 (GLOVE) ×6 IMPLANT
GLOVE BIOGEL PI INDICATOR 7.5 (GLOVE) ×12
GOWN STRL REUS W/TWL LRG LVL3 (GOWN DISPOSABLE) ×18 IMPLANT
HANDLE SUCTION POOLE (INSTRUMENTS) ×1 IMPLANT
HANDLE YANKAUER SUCT BULB TIP (MISCELLANEOUS) ×3 IMPLANT
LIGASURE IMPACT 36 18CM CVD LR (INSTRUMENTS) ×3 IMPLANT
NDL HPO THNWL 1X22GA REG BVL (NEEDLE) ×1 IMPLANT
NEEDLE HYPO 18GX1.5 BLUNT FILL (NEEDLE) IMPLANT
NEEDLE HYPO 21X1.5 SAFETY (NEEDLE) ×3 IMPLANT
NEEDLE SAFETY 22GX1 (NEEDLE) ×2
NS IRRIG 1000ML POUR BTL (IV SOLUTION) ×6 IMPLANT
PACK BASIN MAJOR ARMC (MISCELLANEOUS) ×3 IMPLANT
PACK COLON CLEAN CLOSURE (MISCELLANEOUS) ×3 IMPLANT
RELOAD PROXIMATE 75MM BLUE (ENDOMECHANICALS) ×9 IMPLANT
RETRACTOR WND ALEXIS-O 25 LRG (MISCELLANEOUS) ×1 IMPLANT
RTRCTR WOUND ALEXIS O 25CM LRG (MISCELLANEOUS) ×3
SPONGE LAP 18X18 RF (DISPOSABLE) ×3 IMPLANT
STAPLER PROXIMATE 75MM BLUE (STAPLE) ×3 IMPLANT
STAPLER SKIN PROX 35W (STAPLE) ×3 IMPLANT
SUCTION POOLE HANDLE (INSTRUMENTS) ×3
SUT PDS AB 1 TP1 96 (SUTURE) ×3 IMPLANT
SUT SILK 2 0 (SUTURE)
SUT SILK 2 0 SH (SUTURE) ×3 IMPLANT
SUT SILK 2-0 18XBRD TIE 12 (SUTURE) IMPLANT
SUT SILK 3-0 (SUTURE) ×3 IMPLANT
SUT VIC AB 3-0 SH 27 (SUTURE) ×4
SUT VIC AB 3-0 SH 27X BRD (SUTURE) ×2 IMPLANT
SYR 20CC LL (SYRINGE) ×6 IMPLANT
SYSTEM CONTND EXTRCTN KII BLLN (MISCELLANEOUS) ×1 IMPLANT
TOWEL OR 17X26 4PK STRL BLUE (TOWEL DISPOSABLE) ×3 IMPLANT
TRAY FOLEY MTR SLVR 16FR STAT (SET/KITS/TRAYS/PACK) ×3 IMPLANT

## 2018-08-15 NOTE — Anesthesia Procedure Notes (Signed)
Procedure Name: Intubation Date/Time: 08/15/2018 2:29 PM Performed by: Allean Found, CRNA Pre-anesthesia Checklist: Patient identified, Emergency Drugs available, Suction available, Patient being monitored and Timeout performed Patient Re-evaluated:Patient Re-evaluated prior to induction Oxygen Delivery Method: Circle system utilized Preoxygenation: Pre-oxygenation with 100% oxygen Induction Type: IV induction Ventilation: Mask ventilation without difficulty Tube type: Oral Tube size: 7.5 mm Number of attempts: 1 Placement Confirmation: ETT inserted through vocal cords under direct vision,  positive ETCO2 and breath sounds checked- equal and bilateral Secured at: 24 cm Tube secured with: Tape Dental Injury: Teeth and Oropharynx as per pre-operative assessment

## 2018-08-15 NOTE — Anesthesia Preprocedure Evaluation (Signed)
Anesthesia Evaluation  Patient identified by MRN, date of birth, ID band Patient awake    Reviewed: Allergy & Precautions, NPO status , Patient's Chart, lab work & pertinent test results  History of Anesthesia Complications Negative for: history of anesthetic complications  Airway Mallampati: II  TM Distance: >3 FB Neck ROM: Full    Dental no notable dental hx.    Pulmonary neg pulmonary ROS, neg sleep apnea, neg COPD,    breath sounds clear to auscultation- rhonchi (-) wheezing      Cardiovascular Exercise Tolerance: Good hypertension, (-) CAD, (-) Past MI, (-) Cardiac Stents and (-) CABG  Rhythm:Regular Rate:Normal - Systolic murmurs and - Diastolic murmurs    Neuro/Psych negative neurological ROS  negative psych ROS   GI/Hepatic Neg liver ROS, GERD  ,  Endo/Other  negative endocrine ROSneg diabetes  Renal/GU negative Renal ROS     Musculoskeletal negative musculoskeletal ROS (+)   Abdominal (+) - obese,   Peds  Hematology  (+) anemia ,   Anesthesia Other Findings Past Medical History: No date: Anemia No date: Cancer ([place])     Comment:  colon ca No date: Cecum mass No date: GERD (gastroesophageal reflux disease) No date: Hypertension   Reproductive/Obstetrics                             Anesthesia Physical Anesthesia Plan  ASA: II  Anesthesia Plan: General   Post-op Pain Management:    Induction: Intravenous  PONV Risk Score and Plan: 1 and Ondansetron, Dexamethasone and Midazolam  Airway Management Planned: Oral ETT  Additional Equipment:   Intra-op Plan:   Post-operative Plan: Extubation in OR  Informed Consent: I have reviewed the patients History and Physical, chart, labs and discussed the procedure including the risks, benefits and alternatives for the proposed anesthesia with the patient or authorized representative who has indicated his/her understanding and  acceptance.   Dental advisory given  Plan Discussed with: CRNA and Anesthesiologist  Anesthesia Plan Comments:         Anesthesia Quick Evaluation

## 2018-08-15 NOTE — Anesthesia Postprocedure Evaluation (Signed)
Anesthesia Post Note  Patient: Luke [name]  Procedure(s) Performed: RIGHT  COLECTOMY (Right )  Patient location during evaluation: PACU Anesthesia Type: General Level of consciousness: awake and alert and oriented Pain management: pain level controlled Vital Signs Assessment: post-procedure vital signs reviewed and stable Respiratory status: spontaneous breathing, nonlabored ventilation and respiratory function stable Cardiovascular status: blood pressure returned to baseline and stable Postop Assessment: no signs of nausea or vomiting Anesthetic complications: no     Last Vitals:  Vitals:   08/15/18 1437 08/15/18 1452  BP: 118/76 125/83  Pulse: 65 71  Resp: 17 12  Temp:    SpO2: 100% 100%    Last Pain:  Vitals:   08/15/18 0827  TempSrc: Oral  PainSc: 0-No pain                 Arvid Marengo

## 2018-08-15 NOTE — Interval H&P Note (Signed)
History and Physical Interval Note:  08/15/2018 9:39 AM  Luke [name]  has presented today for surgery, with the diagnosis of colon mass  The various methods of treatment have been discussed with the patient and family. After consideration of risks, benefits and other options for treatment, the patient has consented to  Procedure(s): RIGHT  COLECTOMY (Right) as a surgical intervention .  The patient's history has been reviewed, patient examined, no change in status, stable for surgery.  I have reviewed the patient's chart and labs.  Questions were answered to the patient's satisfaction.     Vickie Epley

## 2018-08-15 NOTE — Anesthesia Post-op Follow-up Note (Signed)
Anesthesia QCDR form completed.        

## 2018-08-15 NOTE — Transfer of Care (Signed)
Immediate Anesthesia Transfer of Care Note  Patient: Luke [name]  Procedure(s) Performed: RIGHT  COLECTOMY (Right )  Patient Location: PACU  Anesthesia Type:General  Level of Consciousness: drowsy  Airway & Oxygen Therapy: Patient Spontanous Breathing and Patient connected to face mask oxygen  Post-op Assessment: Report given to RN and Post -op Vital signs reviewed and stable  Post vital signs: Reviewed and stable  Last Vitals:  Vitals Value Taken Time  BP 106/84 08/15/2018  2:22 PM  Temp    Pulse 69 08/15/2018  2:27 PM  Resp 23 08/15/2018  2:27 PM  SpO2 100 % 08/15/2018  2:27 PM  Vitals shown include unvalidated device data.  Last Pain:  Vitals:   08/15/18 0827  TempSrc: Oral  PainSc: 0-No pain         Complications: No apparent anesthesia complications

## 2018-08-15 NOTE — Op Note (Signed)
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  PROCEDURE(S): (cpt's: 33295) 1.) Partial (Right) colectomy with primary linear stapled anastomosis  INTRAOPERATIVE FINDINGS: Tattooed bulky intraluminal mass of the cecum and proximal ascending colon without evidence of direct extraluminal extension or peritoneal metastases, no visible or palpable liver metastases  INTRAVENOUS FLUIDS: 2000 mL crystalloid   ESTIMATED BLOOD LOSS: 100 mL  URINE OUTPUT: 200 mL  SPECIMENS: Right/ascending colon and terminal ileum, including appendix  IMPLANTS: None  DRAINS: None   COMPLICATIONS: None apparent   CONDITION AT END OF PROCEDURE: Hemodynamically stable and extubated   DISPOSITION OF PATIENT: PACU  INDICATIONS FOR PROCEDURE:  Patient is a 37 y.o. male who presented to [facility] last month for lightheadedness and chest pain x "a few" days, for which labs demonstrated anemia. Patient also reported 30 lbs unintentional weight loss, bright red blood per rectum, and RLQ/Right flank pain. Colonoscopy was advised, but due to cocaine on toxicology screen and stable hemoglobin, patient was discharged and 3 weeks later underwent outpatient colonoscopy, which demonstrated a large non-obstructing cecal mass, consistent on pathology with villous adenoma. CT demonstrated a 7.5 cm cecal mass extending into the ascending colon with no evidence of metastases. All risks, benefits, and alternatives to above procedure were discussed with the patient, all of patient's questions were answered to his expressed satisfaction, and informed consent was obtained and documented. Pre-operative prep was ordered, and patient confirmed he  completed prep as directed.  DETAILS OF PROCEDURE: Patient was brought to the operating suite and appropriately identified. General anesthesia was administered along with appropriate pre-operative antibiotics, and endotracheal intubation was performed by anesthetist. In supine position, operative site was prepped and draped in the usual sterile fashion, and following a brief time out, an ~6 cm vertical midline incision was made from the Right side of the umbilicus and extended inferiorly using a #10 blade scalpel and extended deep through subcutaneous tissues until fascia was visualized and exposed along the linea alba midline between the rectus abdominal muscles. Lower midline fascia was then divided in the midline. Upon dissecting through preperitoneal fat, the peritoneum was entered and opened along the length of the incision, taking care to avoid injury to underlying viscera.  A large, firm, and irregular mass was palpated in the cecum/ascending colon, abdominal cavity was explored, and the liver was palpated with neither visible nor palpable evidence of hepatic, omental, or peritoneal metastases. The ascending colon was then retracted medially using a moist towel and, using a combination of blunt dissection and electrocautery, the colon was freed from its lateral peritoneal attachments along the white line of Toldt proximally from the ileocecal junction and distally to the proximal transverse colon, taking down the hepatic flexure in the process. During this dissection, the ureters were protected. Points of transection were selected proximally and distally, and first the terminal ileum and then the proximal transverse colon were stapled and divided using a GIA-75 linear cutting stapler. Peritoneum was then scored along the mesentary with electrocautery, and the vessels were cauterized, sealed, and cut using the Ligasure. The Right colic artery was ligated with 2-0 silk suture ligature. Colon specimen  including terminal ileum and appendix was then handed off of the field as specimen for pathology. Two stapled ends of bowel were confirmed to  lay adjacent and parallel to one another without tension, and the corners were each cut and dilated, through which linear cutting stapler was advanced and used to create the colonic anastomosis and to close the resulting colonic defect.  The staple line was inspected and found to be intact, widely patient, and hemostatic. Interrupted 3-0 silk Lembert sutures were then used to approximate tissue over the new anastomosis and to close the resulting mesenteric defect. The abdominal cavity was then copiously irrigated, and hemostasis was once more confirmed. Exparel (72-hour release liposomal formulation of bupivicane) was injected into the fascia and subcutaneously, and the abdominal wall was then re-approximated in layers with #1 looped PDS sutures from the top and bottom of the incision. Buried interrupted 3-0 Vicryl sutures were used to re-approximate dermis, and surgical skin staples were used to re-approximate skin. Skin was then cleaned and dried, and a sterile dressing was applied. Patient was then safely able to be extubated, awakened, and transferred to PACU for post-operative monitoring and care.  I was present for all aspects of the above procedure, and no operative complications were apparent.

## 2018-08-16 ENCOUNTER — Encounter: Payer: Self-pay | Admitting: Surgery

## 2018-08-16 LAB — BASIC METABOLIC PANEL
Anion gap: 7 (ref 5–15)
BUN: 9 mg/dL (ref 6–20)
CALCIUM: 8.6 mg/dL — AB (ref 8.9–10.3)
CO2: 23 mmol/L (ref 22–32)
Chloride: 106 mmol/L (ref 98–111)
Creatinine, Ser: 1.41 mg/dL — ABNORMAL HIGH (ref 0.61–1.24)
GFR calc Af Amer: >60 mL/min (ref >60)
Glucose, Bld: 145 mg/dL — ABNORMAL HIGH (ref 70–99)
Potassium: 4.2 mmol/L (ref 3.5–5.1)
SODIUM: 136 mmol/L (ref 135–145)

## 2018-08-16 LAB — CBC
HCT: 35.8 % — ABNORMAL LOW (ref 39.0–52.0)
Hemoglobin: 10 g/dL — ABNORMAL LOW (ref 13.0–17.0)
MCH: 20.6 pg — AB (ref 26.0–34.0)
MCHC: 27.9 g/dL — ABNORMAL LOW (ref 30.0–36.0)
MCV: 73.8 fL — ABNORMAL LOW (ref 80.0–100.0)
PLATELETS: 265 10*3/uL (ref 150–400)
RBC: 4.85 MIL/uL (ref 4.22–5.81)
RDW: 26.8 % — ABNORMAL HIGH (ref 11.5–15.5)
WBC: 11.6 10*3/uL — AB (ref 4.0–10.5)
nRBC: 0 % (ref 0.0–0.2)

## 2018-08-16 MED ORDER — BOOST / RESOURCE BREEZE PO LIQD CUSTOM
1.0000 | Freq: Three times a day (TID) | ORAL | Status: DC
  Administered 2018-08-16 (×3): 1 via ORAL

## 2018-08-16 MED ORDER — LACTATED RINGERS IV BOLUS
1000.0000 mL | Freq: Once | INTRAVENOUS | Status: AC
  Administered 2018-08-16: 1000 mL via INTRAVENOUS

## 2018-08-16 MED ORDER — HYDROMORPHONE HCL 1 MG/ML IJ SOLN
0.5000 mg | Freq: Once | INTRAMUSCULAR | Status: AC
  Administered 2018-08-16: 0.5 mg via INTRAVENOUS
  Filled 2018-08-16: qty 0.5

## 2018-08-16 NOTE — Progress Notes (Signed)
[place] Hospital Day(s): 1.   Post op day(s): 1 Day Post-Op.   Interval History: Patient seen and examined, no acute events or new complaints overnight. Patient reports abdominal soreness at the distal end of his incision. He denies any nausea, emesis, fever, chills, SOB, chest pain, or lower extremity edema. Has not been mobilizing.  Review of Systems:  Constitutional: denies fever, chills  Respiratory: denies any shortness of breath  Cardiovascular: denies chest pain or palpitations  Gastrointestinal: + abdominal pain, N/V, or diarrhea/and bowel function as per interval history Musculoskeletal: denies pain, decreased motor or sensation Integumentary: denies any other rashes or skin discolorations  Vital signs in last 24 hours: [min-max] current  Temp:  [97 F (36.1 C)-98.2 F (36.8 C)] 98.2 F (36.8 C) (10/15 0429) Pulse Rate:  [45-95] 56 (10/15 0429) Resp:  [0-23] 20 (10/15 0429) BP: (106-143)/(74-95) 109/74 (10/15 0429) SpO2:  [97 %-100 %] 100 % (10/15 0429) Weight:  [83.9 kg] 83.9 kg (10/14 0827)     Height: 5\' 8"  (172.7 cm) Weight: 83.9 kg BMI (Calculated): 28.14   Intake/Output this shift:  No intake/output data recorded.   Intake/Output last 2 shifts:  @IOLAST2SHIFTS @   Physical Exam:  Constitutional: alert, cooperative and no distress  Respiratory: breathing non-labored at rest  Cardiovascular: regular rate and sinus rhythm  Gastrointestinal: soft, midline incisional tenderness, and non-distended, midline laparotomy incision is CDI, some distal saturation to the honeycomb dressing noted Musculoskeletal: No peripheral edema, no calf tenderness   Labs:  CBC Latest Ref Rng & Units 08/16/2018 08/12/2018 08/04/2018  WBC 4.0 - 10.5 K/uL 11.6(H) 6.1 5.0  Hemoglobin 13.0 - 17.0 g/dL 10.0(L) 10.7(L) 9.8(L)  Hematocrit 39.0 - 52.0 % 35.8(L) 39.7 34.2(L)  Platelets 150 - 400 K/uL 265 220 158   CMP Latest Ref Rng & Units 08/16/2018 08/12/2018 07/05/2018   Glucose 70 - 99 mg/dL 145(H) 88 92  BUN 6 - 20 mg/dL 9 11 6   Creatinine 0.61 - 1.24 mg/dL 1.41(H) 1.29(H) 1.66(H)  Sodium 135 - 145 mmol/L 136 138 138  Potassium 3.5 - 5.1 mmol/L 4.2 3.9 3.6  Chloride 98 - 111 mmol/L 106 106 110  CO2 22 - 32 mmol/L 23 27 24   Calcium 8.9 - 10.3 mg/dL 8.6(L) 8.9 8.6(L)  Total Protein 6.5 - 8.1 g/dL - 7.3 7.4  Total Bilirubin 0.3 - 1.2 mg/dL - 0.3 0.6  Alkaline Phos 38 - 126 U/L - 50 51  AST 15 - 41 U/L - 29 17  ALT 0 - 44 U/L - 36 12     Imaging studies: No new pertinent imaging studies   Assessment/Plan: (ICD-10's: C18.0) 37 y.o. male 1 Day Post-Op s/p right colectomy for a neoplasm of the cecum, complicated by pertinent comorbidities including HTN, CKD, GERD, cocaine abuse, and chronic ongoing tobacco abuse (smoking.   - Start on clear liquids + Boost Supplementation  - Discontinue Foley Catheter  - Serial abdominal exam and monitor for ongoing bowel function  - Decrease IVF as diet advacnes   - Trend AM WBC (CBC).   - DVT Prophylaxis, encouraged mobilization  All of the above findings and recommendations were discussed with the patient, patient's family, and the medical team, and all of patient's and family's questions were answered to their expressed satisfaction.  -- Edison Simon, PA-C [facility] 08/16/2018, 7:50 AM [phone] M-F: 7am - 4pm

## 2018-08-17 ENCOUNTER — Ambulatory Visit: Payer: Self-pay | Admitting: Gastroenterology

## 2018-08-17 DIAGNOSIS — I10 Essential (primary) hypertension: Secondary | ICD-10-CM | POA: Diagnosis present

## 2018-08-17 LAB — CBC
HCT: 32.2 % — ABNORMAL LOW (ref 39.0–52.0)
HEMOGLOBIN: 8.8 g/dL — AB (ref 13.0–17.0)
MCH: 20.5 pg — ABNORMAL LOW (ref 26.0–34.0)
MCHC: 27.3 g/dL — AB (ref 30.0–36.0)
MCV: 75.1 fL — ABNORMAL LOW (ref 80.0–100.0)
Platelets: 258 10*3/uL (ref 150–400)
RBC: 4.29 MIL/uL (ref 4.22–5.81)
RDW: 26.2 % — AB (ref 11.5–15.5)
WBC: 10.3 10*3/uL (ref 4.0–10.5)
nRBC: 0 % (ref 0.0–0.2)

## 2018-08-17 MED ORDER — AMLODIPINE BESYLATE 5 MG PO TABS
5.0000 mg | ORAL_TABLET | Freq: Every day | ORAL | Status: DC
  Administered 2018-08-18: 5 mg via ORAL
  Filled 2018-08-17: qty 1

## 2018-08-17 MED ORDER — HYDRALAZINE HCL 20 MG/ML IJ SOLN
10.0000 mg | INTRAMUSCULAR | Status: DC | PRN
  Administered 2018-08-17: 10 mg via INTRAVENOUS
  Filled 2018-08-17: qty 1

## 2018-08-17 MED ORDER — ENSURE ENLIVE PO LIQD
237.0000 mL | Freq: Two times a day (BID) | ORAL | Status: DC
  Administered 2018-08-17 – 2018-08-18 (×3): 237 mL via ORAL

## 2018-08-17 NOTE — Progress Notes (Addendum)
SURGICAL PROGRESS NOTE  Patient seen and examined as described below with surgical PA-C, Ardell Isaacs except patient now reports improved pain and "a few" episodes of +flatus, tolerating full liquids diet.  Assessment/Plan: (ICD-10's: K66.89) 37 y.o. 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   - follow up pending pathology  - pain control as needed (minimize narcotics)  - gradually advance diet as tolerated, discontinue IV fluids  - continue to monitor abdominal exam and bowel function  - medical management of comorbidities (home medications)  - anticipate discharge planning over next 1 - 2 days  - DVT prophylaxis, ambulation encouraged  All of the above findings and recommendations were discussed with the patient and patient's family, and all of patient's and family's questions were answered to their expressed satisfaction.  -- Marilynne Drivers Rosana Hoes, MD, [place]: [facility] - Partnering for exceptional care. Office: [place] Hospital Day(s): 2.   Post op day(s): 2 Days Post-Op.   Interval History: Patient seen and examined, no acute events or new complaints overnight. Patient reports continued abdominal soreness near his incision, denies nausea, emesis. He notes that he gets hiccups with the medications but those resolve. No flatus but did have a bowel movement yesterday.  Review of Systems:  Constitutional: denies fever, chills  Respiratory: denies any shortness of breath  Cardiovascular: denies chest pain or palpitations  Gastrointestinal: + abdominal pain, denied N/V, or diarrhea/and bowel function as  per interval history Musculoskeletal: denies pain, decreased motor or sensation Integumentary: denies any other rashes or skin discolorations  Vital signs in last 24 hours: [min-max] current  Temp:  [97.8 F (36.6 C)-98.4 F (36.9 C)] 98 F (36.7 C) (10/16 0435) Pulse Rate:  [59] 59 (10/16 0435) Resp:  [16-20] 18 (10/16 0435) BP: (128-164)/(86-107) 146/98 (10/16 0435) SpO2:  [97 %-100 %] 97 % (10/16 0435)     Height: 5\' 8"  (172.7 cm) Weight: 83.9 kg BMI (Calculated): 28.14   Intake/Output this shift:  No intake/output data recorded.   Intake/Output last 2 shifts:  @IOLAST2SHIFTS @   Physical Exam:  Constitutional: alert, cooperative and no distress  Respiratory: breathing non-labored at rest  Cardiovascular: regular rate and sinus rhythm  Gastrointestinal: soft, diffusely tender, and non-distended, dressing over midline laparotomy incision  Labs:  CBC Latest Ref Rng & Units 08/17/2018 08/16/2018 08/12/2018  WBC 4.0 - 10.5 K/uL 10.3 11.6(H) 6.1  Hemoglobin 13.0 - 17.0 g/dL 8.8(L) 10.0(L) 10.7(L)  Hematocrit 39.0 - 52.0 % 32.2(L) 35.8(L) 39.7  Platelets 150 - 400 K/uL 258 265 220   CMP Latest Ref Rng & Units 08/16/2018 08/12/2018 07/05/2018  Glucose 70 - 99 mg/dL 145(H) 88 92  BUN 6 - 20 mg/dL 9 11 6   Creatinine 0.61 - 1.24 mg/dL 1.41(H) 1.29(H) 1.66(H)  Sodium 135 - 145 mmol/L 136 138 138  Potassium 3.5 - 5.1 mmol/L 4.2 3.9 3.6  Chloride 98 - 111 mmol/L 106 106 110  CO2 22 - 32 mmol/L 23 27 24   Calcium 8.9 - 10.3 mg/dL 8.6(L) 8.9 8.6(L)  Total Protein 6.5 - 8.1 g/dL - 7.3 7.4  Total Bilirubin 0.3 - 1.2 mg/dL - 0.3 0.6  Alkaline Phos 38 - 126 U/L - 50 51  AST  15 - 41 U/L - 29 17  ALT 0 - 44 U/L - 36 12     Imaging studies: No new pertinent imaging studies   Assessment/Plan: (ICD-10's: C18.0) 37 y.o. male 2 Days Post-Op s/p right colectomy for a neoplasm of the cecum, complicated by pertinent comorbidities including HTN,CKD,GERD, cocaine abuse, and chronic ongoing  tobacco abuse (smoking.   - Advance diet to full liquids + ensure, continue to advance as tolerates             - Serial abdominal exam and monitor for ongoing bowel function             - D/C IVF               - DVT Prophylaxis, encouraged mobilization  - Discharge planning next 24-48 hours hopefully  All of the above findings and recommendations were discussed with the patient, patient's family, and the medical team, and all of patient's questions were answered to his expressed satisfaction.  -- Edison Simon, PA-C [facility] 08/17/2018, 7:54 AM [phone] M-F: 7am - 4pm

## 2018-08-17 NOTE — Progress Notes (Signed)
Dr. Rosana Hoes notified of sustained elevated BP; acknowledged; requested Hospitalist to see; New orders written; Dr. Jannifer Franklin notified of sustained BP; acknowledged consult; new order written; will come later this evening to see patient. Barbaraann Faster, RN; 08/17/2018

## 2018-08-17 NOTE — Consult Note (Signed)
[facility] NAME: Luke [name]    MR#:  [id number]  DATE OF BIRTH:  03/04/1981  DATE OF ADMISSION:  08/15/2018  PRIMARY CARE PHYSICIAN: Patient, No Pcp Per   REQUESTING/REFERRING PHYSICIAN: Rosana Hoes, MD  CHIEF COMPLAINT:  Colonic mass  HISTORY OF PRESENT ILLNESS:  Luke [name]  is a 37 y.o. male who presents with colonic mass, and was admitted to the surgery service for right colectomy.  He did well during surgery and postop until tonight he developed significant uncontrolled hypertension.  Hospitalist were consulted to help manage his medical conditions.  PAST MEDICAL HISTORY:   Past Medical History:  Diagnosis Date  . Anemia   . Cancer ([place])    colon ca  . Cecum mass   . GERD (gastroesophageal reflux disease)   . Hypertension     PAST SURGICAL HISTOIRY:   Past Surgical History:  Procedure Laterality Date  . COLONOSCOPY WITH PROPOFOL N/A 07/28/2018   Procedure: COLONOSCOPY WITH PROPOFOL;  Surgeon: Virgel Manifold, MD;  Location: [facility] ENDOSCOPY;  Service: Endoscopy;  Laterality: N/A;  . ESOPHAGOGASTRODUODENOSCOPY (EGD) WITH PROPOFOL N/A 07/28/2018   Procedure: ESOPHAGOGASTRODUODENOSCOPY (EGD) WITH PROPOFOL;  Surgeon: Virgel Manifold, MD;  Location: [facility] ENDOSCOPY;  Service: Endoscopy;  Laterality: N/A;  . none    . PARTIAL COLECTOMY Right 08/15/2018   Procedure: RIGHT  COLECTOMY;  Surgeon: Vickie Epley, MD;  Location: [facility] ORS;  Service: General;  Laterality: Right;    SOCIAL HISTORY:   Social History   Tobacco Use  . Smoking status: Never Smoker  . Smokeless tobacco: Never Used  Substance Use Topics  . Alcohol use: No    Comment:  rarely    FAMILY HISTORY:   Family History  Problem Relation Age of Onset  . CAD Maternal Grandmother   . Cancer Maternal Grandmother   . Cancer Maternal Grandfather   . Diabetes Mother   . Hypertension Father     DRUG ALLERGIES:  No Known  Allergies  REVIEW OF SYSTEMS:  Review of Systems  Constitutional: Negative for chills, fever, malaise/fatigue and weight loss.  HENT: Negative for ear pain, hearing loss and tinnitus.   Eyes: Negative for blurred vision, double vision, pain and redness.  Respiratory: Negative for cough, hemoptysis and shortness of breath.   Cardiovascular: Negative for chest pain, palpitations, orthopnea and leg swelling.  Gastrointestinal: Positive for abdominal pain. Negative for constipation, diarrhea, nausea and vomiting.  Genitourinary: Negative for dysuria, frequency and hematuria.  Musculoskeletal: Negative for back pain, joint pain and neck pain.  Skin:       No acne, rash, or lesions  Neurological: Positive for headaches. Negative for dizziness, tremors, focal weakness and weakness.  Endo/Heme/Allergies: Negative for polydipsia. Does not bruise/bleed easily.  Psychiatric/Behavioral: Negative for depression. The patient is not nervous/anxious and does not have insomnia.     MEDICATIONS AT HOME:   Prior to Admission medications   Medication Sig Start Date End Date Taking? Authorizing Provider  bisacodyl (DULCOLAX) 5 MG EC tablet Take all 4 tablets at 8 am the day before your surgery 08/09/18  Yes Vickie Epley, MD  Cyanocobalamin (VITAMIN B-12 PO) Take 1 tablet by mouth daily.   Yes [provider]  erythromycin base (E-MYCIN) 500 MG tablet Take 1 tablet (500 mg total) by mouth as directed. 08/09/18  Yes Vickie Epley, MD  IRON PO Take 1 tablet by mouth daily.   Yes [provider]  neomycin (MYCIFRADIN) 500 MG tablet Take 1 tablet (500 mg total) by mouth See admin instructions. Take 1 tablet at 8am, 2pm, and 8pm the day prior to your surgery 08/09/18  Yes Vickie Epley, MD  pantoprazole (PROTONIX) 40 MG tablet Take 1 tablet (40 mg total) by mouth 2 (two) times daily. 07/05/18  Yes Mayo, Pete Pelt, MD      VITAL SIGNS:   Vitals:   08/16/18 1955 08/17/18 0435 08/17/18  1455 08/17/18 2046  BP: (!) 164/107 (!) 146/98 (!) 139/99 (!) 154/102  Pulse: (!) 59 (!) 59 (!) 57 62  Resp: 20 18 16 20   Temp: 98.1 F (36.7 C) 98 F (36.7 C) 98.5 F (36.9 C) 98.4 F (36.9 C)  TempSrc: Oral Oral Oral Oral  SpO2: 100% 97% 99% 97%  Weight:      Height:       Wt Readings from Last 3 Encounters:  08/15/18 83.9 kg  08/12/18 84.2 kg  08/09/18 84.1 kg    PHYSICAL EXAMINATION:  Physical Exam  Vitals reviewed. Constitutional: He is oriented to person, place, and time. He appears well-developed and well-nourished. No distress.  HENT:  Head: Normocephalic and atraumatic.  Mouth/Throat: Oropharynx is clear and moist.  Eyes: Pupils are equal, round, and reactive to light. Conjunctivae and EOM are normal. No scleral icterus.  Neck: Normal range of motion. Neck supple. No JVD present. No thyromegaly present.  Cardiovascular: Normal rate, regular rhythm and intact distal pulses. Exam reveals no gallop and no friction rub.  No murmur heard. Respiratory: Effort normal and breath sounds normal. No respiratory distress. He has no wheezes. He has no rales.  GI: Soft. Bowel sounds are normal. He exhibits no distension. There is tenderness.  Musculoskeletal: Normal range of motion. He exhibits no edema.  No arthritis, no gout  Lymphadenopathy:    He has no cervical adenopathy.  Neurological: He is alert and oriented to person, place, and time. No cranial nerve deficit.  No dysarthria, no aphasia  Skin: Skin is warm and dry. No rash noted. No erythema.  Psychiatric: He has a normal mood and affect. His behavior is normal. Judgment and thought content normal.     LABORATORY PANEL:   CBC Recent Labs  Lab 08/17/18 0442  WBC 10.3  HGB 8.8*  HCT 32.2*  PLT 258   ------------------------------------------------------------------------------------------------------------------  Chemistries  Recent Labs  Lab 08/12/18 0855 08/16/18 0209  NA 138 136  K 3.9 4.2  CL 106  106  CO2 27 23  GLUCOSE 88 145*  BUN 11 9  CREATININE 1.29* 1.41*  CALCIUM 8.9 8.6*  AST 29  --   ALT 36  --   ALKPHOS 50  --   BILITOT 0.3  --    ------------------------------------------------------------------------------------------------------------------  Cardiac Enzymes No results for input(s): TROPONINI in the last 168 hours. ------------------------------------------------------------------------------------------------------------------  RADIOLOGY:  No results found.  EKG:   Orders placed or performed during the hospital encounter of 07/03/18  . EKG 12-Lead  . EKG 12-Lead  . ED EKG within 10 minutes  . ED EKG within 10 minutes  . EKG    IMPRESSION AND PLAN:  Principal Problem:   Cecum mass -status post right colectomy, defer to surgical team for treatment of this condition Active Problems:   Uncontrolled hypertension -IV PRN antihypertensives tonight.  It seems at some point in the past patient was on amlodipine, will restart this medication p.o.  All the records are reviewed and case discussed with ED provider. Management  plans discussed with the patient and/or family.  CODE STATUS:     Code Status Orders  (From admission, onward)         Start     Ordered   08/15/18 1547  Full code  Continuous     08/15/18 1546        Code Status History    Date Active Date Inactive Code Status Order ID Comments User Context   07/04/2018 0011 07/05/2018 2001 Full Code [id number]  Amelia Jo, MD Inpatient   09/07/2016 0531 09/08/2016 1709 Full Code [id number]  Harrie Foreman, MD Inpatient    Full Code  TOTAL TIME TAKING CARE OF THIS PATIENT: 35 minutes.    Olden Klauer Kennedy 08/17/2018, 9:14 PM  [employer]  [phone]  CC: Primary care Physician: Patient, No Pcp Per  Note:  This document was prepared using Dragon voice recognition software and may include unintentional dictation errors.

## 2018-08-18 LAB — SURGICAL PATHOLOGY

## 2018-08-18 MED ORDER — AMLODIPINE BESYLATE 5 MG PO TABS: 5.0000 mg | ORAL_TABLET | Freq: Every day | ORAL | 3 refills | Status: DC

## 2018-08-18 MED ORDER — OXYCODONE HCL 5 MG PO TABS: 5.0000 mg | ORAL_TABLET | ORAL | 0 refills | Status: DC | PRN

## 2018-08-18 NOTE — Care Management (Signed)
Patient discharged today. Patient to discharge with prescriptions for pain medication and norvasc. Norvasc $4 at [facility], no coupon needed.  Patient declined application to [facility] , and Medication Management , states that he already has one filled out he just hasn't submitted.

## 2018-08-18 NOTE — Progress Notes (Signed)
Patient discharge teaching given, including activity, diet, follow-up appoints, and medications. Patient verbalized understanding of all discharge instructions. IV access was d/c'd. RN went over signs of infection and incision care with pt. Pt verbalized understanding. Vitals are stable. Skin is intact except as charted in most recent assessments. Pt to be escorted out by volunteer, to be driven home by family.  Laray Rivkin [employer]

## 2018-08-18 NOTE — Discharge Summary (Signed)
Discharge Summary  Patient ID: Luke [name] MRN: [id number] DOB/AGE: 1981-06-20 37 y.o.  Admit date: 08/15/2018 Discharge date: 08/18/2018  Discharge Diagnoses Cecum Mass Uncontrolled HTN  Consultants Medicine  Procedures Partial (Right) colectomy with primary linear stapled anastomosis on 08/15/18 with Dr. Tama High, MD  HPI: Luke [name] is a 37 y.o. male who presents to [facility] on 08/15/18 for elective partial right colectomy secondary to a neoplasm of the cecum.   Hospital Course: DAELON DUNIVAN underwent Partial (Right) colectomy with primary linear stapled anastomosis with Dr. Rosana Hoes on 08/15/18. His post-operative period was unremarkable. Patient's pain improved and advancement of patient's diet and ambulation were well-tolerated. The remainder of patient's hospital course was essentially unremarkable, and discharge planning was initiated accordingly with patient safely able to be discharged home with appropriate discharge instructions, pain control, and outpatient  follow-up after all of hisand family's questions were answered to his expressed satisfaction.  Physical Exam:  Constitutional: alert, cooperative and no distress  Respiratory: breathing non-labored at rest  Cardiovascular: regular rate and sinus rhythm  Gastrointestinal: soft, diffusely tender, and non-distended, midline laparotomy incision is CDI without erythema or drainage   Allergies as of 08/18/2018   No Known Allergies     Medication List    TAKE these medications   amLODipine 5 MG tablet Commonly known as:  NORVASC Take 1 tablet (5 mg total) by mouth daily. Start taking on:  08/19/2018   bisacodyl 5 MG EC tablet Commonly known as:  DULCOLAX Take all 4 tablets at 8 am the day before your surgery   erythromycin base 500 MG tablet Commonly known as:  E-MYCIN Take 1 tablet (500 mg total) by mouth as directed.   IRON PO Take 1 tablet by mouth daily.   neomycin 500 MG tablet Commonly known  as:  MYCIFRADIN Take 1 tablet (500 mg total) by mouth See admin instructions. Take 1 tablet at 8am, 2pm, and 8pm the day prior to your surgery   oxyCODONE 5 MG immediate release tablet Commonly known as:  Oxy IR/ROXICODONE Take 1-2 tablets (5-10 mg total) by mouth every 4 (four) hours as needed for moderate pain.   pantoprazole 40 MG tablet Commonly known as:  PROTONIX Take 1 tablet (40 mg total) by mouth 2 (two) times daily.   VITAMIN B-12 PO Take 1 tablet by mouth daily.        Follow-up Information    Vickie Epley, MD. Schedule an appointment as soon as possible for a visit in 1 week(s).   Specialty:  General Surgery Why:  1 week follow up s/p right colectomy with Dr Augustina Mood information: [address] [place] [phone]           Signed: Edison Simon , Hershal Cor[facility]  08/18/2018, 12:19 PM [phone] M-F: 7am - 4pm

## 2018-08-18 NOTE — Progress Notes (Signed)
[facility] [place] NAME: Luke [name]    MR#:  [id number]  DATE OF BIRTH:  01/07/1981  SUBJECTIVE:  CHIEF COMPLAINT:  No chief complaint on file.  -doing well , status post right hemicolectomy for cecal mass. -Possible discharge home today.  Blood pressure is better  REVIEW OF SYSTEMS:  Review of Systems  Constitutional: Negative for chills, fever and malaise/fatigue.  HENT: Negative for congestion, ear discharge, hearing loss and nosebleeds.   Eyes: Negative for blurred vision and double vision.  Respiratory: Negative for cough, shortness of breath and wheezing.   Cardiovascular: Negative for chest pain and palpitations.  Gastrointestinal: Positive for abdominal pain. Negative for constipation, diarrhea, nausea and vomiting.  Genitourinary: Negative for dysuria.  Musculoskeletal: Negative for myalgias.  Neurological: Negative for dizziness, focal weakness, seizures, weakness and headaches.  Psychiatric/Behavioral: Negative for depression.    DRUG ALLERGIES:  No Known Allergies  VITALS:  Blood pressure (!) 156/101, pulse 64, temperature 98.5 F (36.9 C), temperature source Oral, resp. rate 20, height 5\' 8"  (1.727 m), weight 83.9 kg, SpO2 100 %.  PHYSICAL EXAMINATION:  Physical Exam   GENERAL:  37 y.o.-year-old patient lying in the bed with no acute distress.  EYES: Pupils equal, round, reactive to light and accommodation. No scleral icterus. Extraocular muscles intact.  HEENT: Head atraumatic, normocephalic. Oropharynx and nasopharynx clear.  NECK:  Supple, no jugular venous distention. No thyroid enlargement, no tenderness.  LUNGS: Normal breath sounds bilaterally, no wheezing, rales,rhonchi or crepitation. No use of accessory muscles of respiration.  CARDIOVASCULAR: S1, S2 normal. No murmurs, rubs, or gallops.  ABDOMEN: Soft, midline incision in the abdomen with staples, tender around it., nondistended. Bowel sounds present.  No organomegaly or mass.  EXTREMITIES: No pedal edema, cyanosis, or clubbing.  NEUROLOGIC: Cranial nerves II through XII are intact. Muscle strength 5/5 in all extremities. Sensation intact. Gait not checked.  PSYCHIATRIC: The patient is alert and oriented x 3.  SKIN: No obvious rash, lesion, or ulcer.    LABORATORY PANEL:   CBC Recent Labs  Lab 08/17/18 0442  WBC 10.3  HGB 8.8*  HCT 32.2*  PLT 258   ------------------------------------------------------------------------------------------------------------------  Chemistries  Recent Labs  Lab 08/12/18 0855 08/16/18 0209  NA 138 136  K 3.9 4.2  CL 106 106  CO2 27 23  GLUCOSE 88 145*  BUN 11 9  CREATININE 1.29* 1.41*  CALCIUM 8.9 8.6*  AST 29  --   ALT 36  --   ALKPHOS 50  --   BILITOT 0.3  --    ------------------------------------------------------------------------------------------------------------------  Cardiac Enzymes No results for input(s): TROPONINI in the last 168 hours. ------------------------------------------------------------------------------------------------------------------  RADIOLOGY:  No results found.  EKG:   Orders placed or performed during the hospital encounter of 07/03/18  . EKG 12-Lead  . EKG 12-Lead  . ED EKG within 10 minutes  . ED EKG within 10 minutes  . EKG    ASSESSMENT AND PLAN:   37 year old male with past medical history significant for GERD, anemia, hypertension diagnosed with a cecal mass admitted for partial colectomy.  Medical consult requested for hypertension  1.  Hypertension-known history of hypertension, not on home meds due to no recent follow-up and patient ran out of his meds. -Started on low-dose Norvasc.  Would continue at discharge and advised to follow-up with his PCP  2.  Cecal mass-found on colonoscopy, admitted for partial right colectomy. -Postop day 3 today.  Doing well, possible discharge  today -Follow-up with surgery -Tolerating soft  diet  3.  DVT prophylaxis-Lovenox. -Patient is ambulatory  Will sign off     All the records are reviewed and case discussed with Care Management/Social Workerr. Management plans discussed with the patient, family and they are in agreement.  CODE STATUS: Full code  TOTAL TIME TAKING CARE OF THIS PATIENT: 38 minutes.   POSSIBLE D/C IN 1-2 DAYS, DEPENDING ON CLINICAL CONDITION.   Gladstone Lighter M.D on 08/18/2018 at 10:46 AM  Between 7am to 6pm - Pager - [phone]  After 6pm go to [email or web address] - password [email or web address] [place] [facility]  Office  [phone]  CC: Primary care physician; Patient, No Pcp Per

## 2018-08-25 ENCOUNTER — Ambulatory Visit: Payer: Self-pay

## 2018-08-25 ENCOUNTER — Ambulatory Visit (INDEPENDENT_AMBULATORY_CARE_PROVIDER_SITE_OTHER): Payer: Self-pay | Admitting: Surgery

## 2018-08-25 ENCOUNTER — Encounter: Payer: Self-pay | Admitting: Surgery

## 2018-08-25 VITALS — BP 132/90 | HR 73 | Temp 97.7°F | Ht 67.0 in | Wt 173.0 lb

## 2018-08-25 DIAGNOSIS — Z4889 Encounter for other specified surgical aftercare: Secondary | ICD-10-CM

## 2018-08-25 DIAGNOSIS — D490 Neoplasm of unspecified behavior of digestive system: Secondary | ICD-10-CM

## 2018-08-25 DIAGNOSIS — K922 Gastrointestinal hemorrhage, unspecified: Secondary | ICD-10-CM

## 2018-08-25 DIAGNOSIS — K6389 Other specified diseases of intestine: Secondary | ICD-10-CM

## 2018-08-25 NOTE — Patient Instructions (Addendum)
Return in one week nurse for staple removal and follow up in 2 with DR. Davis.   May alternate between Tylenol and Ibuprofen, or may take Aleve twice daily. Use heat to the area to promote healing and help with comfort.

## 2018-08-28 ENCOUNTER — Encounter: Payer: Self-pay | Admitting: Surgery

## 2018-08-28 NOTE — Progress Notes (Signed)
Surgical Clinic Progress/Follow-up Note   HPI:  37 y.o. 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 Patient reports much improved focal peri-incisional pain and has been tolerating regular diet with +flatus and normal BM's without any appreciated blood per rectum, and patient denies N/V, fever/chills, CP, or SOB.  Review of Systems:  Constitutional: denies fever/chills  Respiratory: denies shortness of breath, wheezing  Cardiovascular: denies chest pain, palpitations  Gastrointestinal: abdominal pain, N/V, and bowel function as per interval history Skin: Denies any other rashes or skin discolorations except post-surgical wounds as per interval history  Vital Signs:  BP 132/90   Pulse 73   Temp 97.7 F (36.5 C) (Skin)   Ht 5\' 7"  (1.702 m)   Wt 173 lb (78.5 kg)   BMI 27.10 kg/m    Physical Exam:  Constitutional:  -- Normal body habitus  -- Awake, alert, and oriented x3  Pulmonary:  -- No crackles -- Equal breath sounds bilaterally -- Breathing non-labored at rest Cardiovascular:  -- S1, S2 present -- No pericardial rubs  Gastrointestinal:  -- Soft and non-distended with mild peri-incisional tenderness to palpation, no guarding/rebound tenderness -- Post-surgical incisions all well-approximated without any peri-incisional erythema or drainage -- No abdominal masses appreciated, pulsatile or otherwise  Musculoskeletal / Integumentary:  -- Wounds or skin discoloration: None appreciated except post-surgical incisions as described above (GI) -- Extremities: B/L UE and LE FROM, hands and feet warm, no edema   Imaging: No new pertinent imaging available for review  Surgical Pathology (08/15/2018) TERMINAL ILEUM, RIGHT COLON, AND APPENDIX; RIGHT COLECTOMY:  - PEDUNCULATED VILLOUS  ADENOMA, 4.8 CM, CECUM, ASSOCIATED WITH CARBON  BLACK TATTOO.  - PEDUNCULATED TUBULOVILLOUS ADENOMA, 1.2 CM, ASCENDING COLON.  - APPENDIX WITH INCIDENTAL HYPERPLASTIC POLYP.  - 20 REGIONAL LYMPH NODES WITHOUT PATHOLOGIC CHANGES.   The head of the cecal polyp contains misplaced glands associated with  smooth muscle hyperplasia and inflammation. Both adenomas are negative  for high-grade dysplasia and malignancy.   Assessment:  37 y.o. yo Male with a problem list including...  Patient Active Problem List   Diagnosis Date Noted  . Uncontrolled hypertension 08/17/2018  . Cecum mass   . Columnar-lined esophagus   . Stomach irritation   . Gastric polyp   . Colonic neoplasm   . Diverticulosis of large intestine without diverticulitis   . Iron deficiency anemia 07/23/2018  . B12 deficiency 07/23/2018  . GIB (gastrointestinal bleeding) 07/03/2018  . 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  Plan:              - advance diet as tolerated  - pathology results discussed with patient and his family  - alternating (half) surgical skin staples were removed and steri-strips placed             - after 2 weeks following surgery, may submerge incisions under water (baths, swimming) prn             - continue no heavy lifting >20 lbs until 2 weeks after surgery and no heavy lifting >40 lbs x 4 more weeks             - apply sunblock particularly  to incisions with sun exposure to reduce pigmentation of scars             - return to clinic next week for removal of remaining staples and again in 2 weeks for subsequent follow-up and to discuss and schedule repair of symptomatic Right inguinal hernia with mesh  - instructed to call office if any questions or  concerns  All of the above recommendations were discussed with the patient and patient's family, and all of patient's and family's questions were answered to their expressed satisfaction.  -- Marilynne Drivers Rosana Hoes, MD, [place]: [facility] - Partnering for exceptional care. Office: [phone]

## 2018-09-01 ENCOUNTER — Ambulatory Visit: Payer: Self-pay | Admitting: *Deleted

## 2018-09-01 DIAGNOSIS — K922 Gastrointestinal hemorrhage, unspecified: Secondary | ICD-10-CM

## 2018-09-01 NOTE — Patient Instructions (Signed)
Follow up as scheduled.  

## 2018-09-01 NOTE — Progress Notes (Signed)
Patient came in today for a wound check.  The wound is clean, with no signs of infection noted. The staples  were removed and steri strips applied..  Follow up as scheduled.

## 2018-09-08 ENCOUNTER — Ambulatory Visit (INDEPENDENT_AMBULATORY_CARE_PROVIDER_SITE_OTHER): Payer: Self-pay | Admitting: Surgery

## 2018-09-08 ENCOUNTER — Encounter: Payer: Self-pay | Admitting: Surgery

## 2018-09-08 VITALS — BP 128/89 | HR 77 | Temp 97.7°F | Ht 71.0 in | Wt 181.0 lb

## 2018-09-08 DIAGNOSIS — Z4889 Encounter for other specified surgical aftercare: Secondary | ICD-10-CM

## 2018-09-08 NOTE — Patient Instructions (Addendum)
We will follow up in 2 months to discuss hernia surgery. GENERAL POST-OPERATIVE PATIENT INSTRUCTIONS   WOUND CARE INSTRUCTIONS:  Keep a dry clean dressing on the wound if there is drainage. The initial bandage may be removed after 24 hours.  Once the wound has quit draining you may leave it open to air.  If clothing rubs against the wound or causes irritation and the wound is not draining you may cover it with a dry dressing during the daytime.  Try to keep the wound dry and avoid ointments on the wound unless directed to do so.  If the wound becomes bright red and painful or starts to drain infected material that is not clear, please contact your physician immediately.  If the wound is mildly pink and has a thick firm ridge underneath it, this is normal, and is referred to as a healing ridge.  This will resolve over the next 4-6 weeks.  BATHING: You may shower if you have been informed of this by your surgeon. However, Please do not submerge in a tub, hot tub, or pool until incisions are completely sealed or have been told by your surgeon that you may do so.  DIET:  You may eat any foods that you can tolerate.  It is a good idea to eat a high fiber diet and take in plenty of fluids to prevent constipation.  If you do become constipated you may want to take a mild laxative or take ducolax tablets on a daily basis until your bowel habits are regular.  Constipation can be very uncomfortable, along with straining, after recent surgery.  ACTIVITY:  You are encouraged to cough and deep breath or use your incentive spirometer if you were given one, every 15-30 minutes when awake.  This will help prevent respiratory complications and low grade fevers post-operatively if you had a general anesthetic.  You may want to hug a pillow when coughing and sneezing to add additional support to the surgical area, if you had abdominal or chest surgery, which will decrease pain during these times.  You are encouraged to  walk and engage in light activity for the next two weeks.  You should not lift more than 20 pounds, until 09/26/2018 as it could put you at increased risk for complications.  Twenty pounds is roughly equivalent to a plastic bag of groceries. At that time- Listen to your body when lifting, if you have pain when lifting, stop and then try again in a few days. Soreness after doing exercises or activities of daily living is normal as you get back in to your normal routine.  MEDICATIONS:  Try to take narcotic medications and anti-inflammatory medications, such as tylenol, ibuprofen, naprosyn, etc., with food.  This will minimize stomach upset from the medication.  Should you develop nausea and vomiting from the pain medication, or develop a rash, please discontinue the medication and contact your physician.  You should not drive, make important decisions, or operate machinery when taking narcotic pain medication.  SUNBLOCK Use sun block to incision area over the next year if this area will be exposed to sun. This helps decrease scarring and will allow you avoid a permanent darkened area over your incision.  QUESTIONS:  Please feel free to call our office if you have any questions, and we will be glad to assist you. [phone]      You have chose to have your hernia repaired. This will be done by Dr. Tama High  at [facility].  You will need to arrange to be out of work for 2 weeks and then return with a lifting restrictions for 4 more weeks. Please send any FMLA paperwork prior to surgery and we will fill this out and fax it back to your employer within 3 business days.  You may have a bruise in your groin and also swelling and brusing in your testicle area. You may use ice 4-5 times daily for 15-20 minutes each time. Make sure that you place a barrier between you and the ice pack. To decrease the swelling, you may roll up a bath towel and place it vertically in between your thighs with your testicles  resting on the towel. You will want to keep this area elevated as much as possible for several days following surgery.

## 2018-09-09 ENCOUNTER — Encounter: Payer: Self-pay | Admitting: Surgery

## 2018-09-09 NOTE — Progress Notes (Signed)
Surgical Clinic Progress/Follow-up Note   HPI:  37 y.o. 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 Patient reports his peri-incisional lower midline abdominal pain continues to improve with continuing to tolerate regular diet with +flatus and normal BM's (which he specifies particularly in comparison to pre-surgery), denies N/V, fever/chills, CP, or SOB. He says his Right inguinal hernia doesn't bother him as much as it did prior to his colectomy, but he speculates he may be distracted by his peri-incisional pain.  Review of Systems:  Constitutional: denies fever/chills  Respiratory: denies shortness of breath, wheezing  Cardiovascular: denies chest pain, palpitations  Gastrointestinal: abdominal pain, N/V, and bowel function as per interval history Skin: Denies any other rashes or skin discolorations except post-surgical wounds as per interval history  Vital Signs:  BP 128/89   Pulse 77   Temp 97.7 F (36.5 C) (Temporal)   Ht 5\' 11"  (1.803 m)   Wt 181 lb (82.1 kg)   BMI 25.24 kg/m    Physical Exam:  Constitutional:  -- Normal body habitus  -- Awake, alert, and oriented x3  Pulmonary:  -- No crackles -- Equal breath sounds bilaterally -- Breathing non-labored at rest Cardiovascular:  -- S1, S2 present  -- No pericardial rubs  Gastrointestinal:  -- Soft and non-distended, minimal peri-incisional tenderness to palpation, no guarding/rebound tenderness -- Post-surgical incisions all very well-approximated without any peri-incisional erythema or drainage s/p surgical skin staples removed last week -- No abdominal masses appreciated, pulsatile or otherwise except reducible Right inguinal hernia Musculoskeletal / Integumentary:  -- Wounds or skin discoloration: None  appreciated except post-surgical incisions as described above (GI) -- Extremities: B/L UE and LE FROM, hands and feet warm, no edema   Imaging: No new pertinent imaging available for review  Assessment:  37 y.o. yo Male with a problem list including...  Patient Active Problem List   Diagnosis Date Noted  . Uncontrolled hypertension 08/17/2018  . Columnar-lined esophagus   . Stomach irritation   . Gastric polyp   . Diverticulosis of large intestine without diverticulitis   . Iron deficiency anemia 07/23/2018  . B12 deficiency 07/23/2018  . 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  Plan:              - advance diet as tolerated              - okay to submerge incisions under water (baths, swimming) prn             - no heavy lifting >40 lbs x 3 more weeks, after which may gradually resume all activities without restrictions  - patient requests to defer previously requested repair of his Right inguinal hernia until after he recovers from recent surgery             - return to clinic in 2 months per patient's request to reassess and likely schedule repair of Right inguinal hernia             - apply sunblock particularly to incisions with sun exposure to reduce pigmentation of scars  -  instructed to call office if any questions or concerns  All of the above recommendations were discussed with the patient, and all of patient's questions were answered to his expressed satisfaction.  -- Marilynne Drivers Rosana Hoes, MD, [place]: [facility] - Partnering for exceptional care. Office: [phone]

## 2018-09-13 ENCOUNTER — Ambulatory Visit: Payer: Self-pay | Admitting: Family Medicine

## 2018-09-13 ENCOUNTER — Telehealth: Payer: Self-pay | Admitting: Adult Health Nurse Practitioner

## 2018-09-13 VITALS — BP 140/91 | HR 69 | Wt 183.5 lb

## 2018-09-13 DIAGNOSIS — Z7689 Persons encountering health services in other specified circumstances: Secondary | ICD-10-CM

## 2018-09-13 DIAGNOSIS — I10 Essential (primary) hypertension: Secondary | ICD-10-CM

## 2018-09-13 DIAGNOSIS — Z09 Encounter for follow-up examination after completed treatment for conditions other than malignant neoplasm: Secondary | ICD-10-CM

## 2018-09-13 DIAGNOSIS — K5900 Constipation, unspecified: Secondary | ICD-10-CM

## 2018-09-13 MED ORDER — TRAMADOL HCL 50 MG PO TABS: 50.0000 mg | ORAL_TABLET | Freq: Four times a day (QID) | ORAL | 0 refills | Status: DC | PRN

## 2018-09-13 MED ORDER — DOCUSATE SODIUM 100 MG PO CAPS: 100.0000 mg | ORAL_CAPSULE | Freq: Two times a day (BID) | ORAL | 10 refills | Status: DC

## 2018-09-13 NOTE — Telephone Encounter (Signed)
Patient walked out after appointment without scheduling 3 mo follow-up. Tried to call but no answer and voicemail box not set up yet.

## 2018-09-13 NOTE — Progress Notes (Signed)
Hospital Follow Up--Establish Care  Subjective:    Patient ID: OHN BOSTIC, male    DOB: [birth year], 37 y.o.   MRN: [id number]  Chief Complaint  Patient presents with  . Follow-up    post surgery for colon cancer    HPI  Mr. Oki is a 37 year old male with a past medical history of Pneumonia, Hypertension, GERD, Diverticulitis, Colonic Neoplasm, Cecum Mass, Cancer, and Anemia. He is here today to establish care.    Current Status: Patient is doing well with no complaints. He is s/p: Cecum Mass Removal on 08/15/2018. Tolerated well with no complaints. He has moderate residual pain, which is improving daily. No reports of GI problems such as nausea, vomiting, diarrhea, and constipation. He has no reports of blood in stools, dysuria and hematuria.He has initial appointment with Nephrologist 11/2018, for elevated Creatinine levels.He is currently not taking Amlodipine. Ensure BID. He is eating well.   He denies fevers, chills, fatigue, recent infections, weight loss, and night sweats. He has not had any headaches, visual changes, dizziness, and falls. No chest pain, heart palpitations, cough and shortness of breath reported.  No depression or anxiety reported.  Past Medical History:  Diagnosis Date  . Anemia   . Cancer ([place])    colon ca  . Cecum mass   . Colonic neoplasm   . Diverticulosis of large intestine without diverticulitis   . GERD (gastroesophageal reflux disease)   . Hypertension   . Pneumonia 09/07/2016  . Stomach irritation     Family History  Problem Relation Age of Onset  . CAD Maternal Grandmother   . Breast cancer Maternal Grandmother   . Breast cancer Mother   . Hypertension Mother   . Hypertension Father   . Colon cancer Paternal Uncle      Social History   Socioeconomic History  . Marital status: Single    Spouse name: Not on file  . Number of children: Not on file  . Years of education: Not on file  . Highest education level: Not on file   Occupational History  . Not on file  Social Needs  . Financial resource strain: Not on file  . Food insecurity:    Worry: Not on file    Inability: Not on file  . Transportation needs:    Medical: Not on file    Non-medical: Not on file  Tobacco Use  . Smoking status: Current Every Day Smoker    Packs/day: 0.10  . Smokeless tobacco: Never Used  . Tobacco comment: patient is social smoker/ smokes when he drinks   Substance and Sexual Activity  . Alcohol use: Yes    Alcohol/week: 1.0 standard drinks    Types: 1 Cans of beer per week    Comment: patient reports he consumes ~2 beers about once per month  . Drug use: Not Currently    Types: Marijuana  . Sexual activity: Yes    Birth control/protection: Condom  Lifestyle  . Physical activity:    Days per week: Not on file    Minutes per session: Not on file  . Stress: Not on file  Relationships  . Social connections:    Talks on phone: Not on file    Gets together: Not on file    Attends religious service: Not on file    Active member of club or organization: Not on file    Attends meetings of clubs or organizations: Not on file    Relationship status: Not  on file  . Intimate partner violence:    Fear of current or ex partner: Not on file    Emotionally abused: Not on file    Physically abused: Not on file    Forced sexual activity: Not on file  Other Topics Concern  . Not on file  Social History Narrative  . Not on file    Past Surgical History:  Procedure Laterality Date  . COLONOSCOPY WITH PROPOFOL N/A 07/28/2018   Procedure: COLONOSCOPY WITH PROPOFOL;  Surgeon: Virgel Manifold, MD;  Location: [facility] ENDOSCOPY;  Service: Endoscopy;  Laterality: N/A;  . ESOPHAGOGASTRODUODENOSCOPY (EGD) WITH PROPOFOL N/A 07/28/2018   Procedure: ESOPHAGOGASTRODUODENOSCOPY (EGD) WITH PROPOFOL;  Surgeon: Virgel Manifold, MD;  Location: [facility] ENDOSCOPY;  Service: Endoscopy;  Laterality: N/A;  . none    . PARTIAL COLECTOMY Right  08/15/2018   Procedure: RIGHT  COLECTOMY;  Surgeon: Vickie Epley, MD;  Location: [facility] ORS;  Service: General;  Laterality: Right;    Immunization History  Administered Date(s) Administered  . Influenza,inj,Quad PF,6+ Mos 08/16/2018    Current Meds  Medication Sig  . Cyanocobalamin (VITAMIN B-12 PO) Take 1 tablet by mouth daily.  . IRON PO Take 1 tablet by mouth daily.  . pantoprazole (PROTONIX) 40 MG tablet Take 1 tablet (40 mg total) by mouth 2 (two) times daily.    No Known Allergies  BP (!) 140/91   Pulse 69   Wt 183 lb 8 oz (83.2 kg)   BMI 25.59 kg/m    Review of Systems  Constitutional: Negative.   HENT: Negative.   Eyes: Negative.   Respiratory: Negative.   Cardiovascular: Negative.   Gastrointestinal: Positive for abdominal pain (s/p: cecum mass surgery).  Endocrine: Negative.   Genitourinary: Negative.   Musculoskeletal: Positive for back pain (Chronic).  Skin: Negative.   Allergic/Immunologic: Negative.   Neurological: Positive for light-headedness (Occasional).  Hematological: Negative.   Psychiatric/Behavioral: Negative.    Objective:   Physical Exam  Constitutional: He is oriented to person, place, and time. He appears well-developed and well-nourished.  HENT:  Head: Normocephalic and atraumatic.  Eyes: Pupils are equal, round, and reactive to light. Conjunctivae and EOM are normal.  Neck: Normal range of motion. Neck supple.  Cardiovascular: Normal rate, regular rhythm, normal heart sounds and intact distal pulses.  Pulmonary/Chest: Effort normal and breath sounds normal.  Abdominal: Soft. Bowel sounds are normal.  Musculoskeletal: Normal range of motion.  Neurological: He is alert and oriented to person, place, and time.  Skin: Skin is warm.  Psychiatric: He has a normal mood and affect. His behavior is normal. Judgment and thought content normal.  Nursing note and vitals reviewed.  Assessment & Plan:   1. Hospital discharge follow-up  2.  Encounter to establish care  3. S/P colorectal surgery, follow-up exam We will initiate Tramadol today.  - traMADol (ULTRAM) 50 MG tablet; Take 1 tablet (50 mg total) by mouth every 6 (six) hours as needed.  Dispense: 30 tablet; Refill: 0  4. Essential hypertension Blood pressure elevated at 140/91 today. Continue Amlodipine as prescribed. She will continue to decrease high sodium intake, excessive alcohol intake, increase potassium intake, smoking cessation, and increase physical activity of at least 30 minutes of cardio activity daily. She will continue to follow Heart Healthy or DASH diet.  5. Constipation, unspecified constipation type - docusate sodium (COLACE) 100 MG capsule; Take 1 capsule (100 mg total) by mouth 2 (two) times daily.  Dispense: 10 capsule; Refill: 10  6.  Follow up He will follow up in 3 months.   Meds ordered this encounter  Medications  . docusate sodium (COLACE) 100 MG capsule    Sig: Take 1 capsule (100 mg total) by mouth 2 (two) times daily.    Dispense:  10 capsule    Refill:  10  . traMADol (ULTRAM) 50 MG tablet    Sig: Take 1 tablet (50 mg total) by mouth every 6 (six) hours as needed.    Dispense:  30 tablet    Refill:  0    Kathe Becton,  MSN, FNP-C [facility] [place] [address] [place], [place] [address] [phone]

## 2018-09-13 NOTE — Patient Instructions (Signed)
Tramadol tablets What is this medicine? TRAMADOL (TRA ma dole) is a pain reliever. It is used to treat moderate to severe pain in adults. This medicine may be used for other purposes; ask your health care provider or pharmacist if you have questions. COMMON BRAND NAME(S): Ultram What should I tell my health care provider before I take this medicine? They need to know if you have any of these conditions: -brain tumor -depression -drug abuse or addiction -head injury -if you frequently drink alcohol containing drinks -kidney disease or trouble passing urine -liver disease -lung disease, asthma, or breathing problems -seizures or epilepsy -suicidal thoughts, plans, or attempt; a previous suicide attempt by you or a family member -an unusual or allergic reaction to tramadol, codeine, other medicines, foods, dyes, or preservatives -pregnant or trying to get pregnant -breast-feeding How should I use this medicine? Take this medicine by mouth with a full glass of water. Follow the directions on the prescription label. You can take it with or without food. If it upsets your stomach, take it with food. Do not take your medicine more often than directed. A special MedGuide will be given to you by the pharmacist with each prescription and refill. Be sure to read this information carefully each time. Talk to your pediatrician regarding the use of this medicine in children. Special care may be needed. Overdosage: If you think you have taken too much of this medicine contact a poison control center or emergency room at once. NOTE: This medicine is only for you. Do not share this medicine with others. What if I miss a dose? If you miss a dose, take it as soon as you can. If it is almost time for your next dose, take only that dose. Do not take double or extra doses. What may interact with this medicine? Do not take this medication with any of the following medicines: -MAOIs like Carbex, Eldepryl,  Marplan, Nardil, and Parnate This medicine may also interact with the following medications: -alcohol -antihistamines for allergy, cough and cold -certain medicines for anxiety or sleep -certain medicines for depression like amitriptyline, fluoxetine, sertraline -certain medicines for migraine headache like almotriptan, eletriptan, frovatriptan, naratriptan, rizatriptan, sumatriptan, zolmitriptan -certain medicines for seizures like carbamazepine, oxcarbazepine, phenobarbital, primidone -certain medicines that treat or prevent blood clots like warfarin -digoxin -furazolidone -general anesthetics like halothane, isoflurane, methoxyflurane, propofol -linezolid -local anesthetics like lidocaine, pramoxine, tetracaine -medicines that relax muscles for surgery -other narcotic medicines for pain or cough -phenothiazines like chlorpromazine, mesoridazine, prochlorperazine, thioridazine -procarbazine This list may not describe all possible interactions. Give your health care provider a list of all the medicines, herbs, non-prescription drugs, or dietary supplements you use. Also tell them if you smoke, drink alcohol, or use illegal drugs. Some items may interact with your medicine. What should I watch for while using this medicine? Tell your doctor or health care professional if your pain does not go away, if it gets worse, or if you have new or a different type of pain. You may develop tolerance to the medicine. Tolerance means that you will need a higher dose of the medicine for pain relief. Tolerance is normal and is expected if you take this medicine for a long time. Do not suddenly stop taking your medicine because you may develop a severe reaction. Your body becomes used to the medicine. This does NOT mean you are addicted. Addiction is a behavior related to getting and using a drug for a non-medical reason. If you have pain, you   have a medical reason to take pain medicine. Your doctor will tell  you how much medicine to take. If your doctor wants you to stop the medicine, the dose will be slowly lowered over time to avoid any side effects. There are different types of narcotic medicines (opiates). If you take more than one type at the same time or if you are taking another medicine that also causes drowsiness, you may have more side effects. Give your health care provider a list of all medicines you use. Your doctor will tell you how much medicine to take. Do not take more medicine than directed. Call emergency for help if you have problems breathing or unusual sleepiness. You may get drowsy or dizzy. Do not drive, use machinery, or do anything that needs mental alertness until you know how this medicine affects you. Do not stand or sit up quickly, especially if you are an older patient. This reduces the risk of dizzy or fainting spells. Alcohol can increase or decrease the effects of this medicine. Avoid alcoholic drinks. You may have constipation. Try to have a bowel movement at least every 2 to 3 days. If you do not have a bowel movement for 3 days, call your doctor or health care professional. Your mouth may get dry. Chewing sugarless gum or sucking hard candy, and drinking plenty of water may help. Contact your doctor if the problem does not go away or is severe. What side effects may I notice from receiving this medicine? Side effects that you should report to your doctor or health care professional as soon as possible: -allergic reactions like skin rash, itching or hives, swelling of the face, lips, or tongue -breathing problems -confusion -seizures -signs and symptoms of low blood pressure like dizziness; feeling faint or lightheaded, falls; unusually weak or tired -trouble passing urine or change in the amount of urine Side effects that usually do not require medical attention (report to your doctor or health care professional if they continue or are bothersome): -constipation -dry  mouth -nausea, vomiting -tiredness This list may not describe all possible side effects. Call your doctor for medical advice about side effects. You may report side effects to FDA at 1-800-FDA-1088. Where should I keep my medicine? Keep out of the reach of children. This medicine may cause accidental overdose and death if it taken by other adults, children, or pets. Mix any unused medicine with a substance like cat litter or coffee grounds. Then throw the medicine away in a sealed container like a sealed bag or a coffee can with a lid. Do not use the medicine after the expiration date. Store at room temperature between 15 and 30 degrees C (59 and 86 degrees F). NOTE: This sheet is a summary. It may not cover all possible information. If you have questions about this medicine, talk to your doctor, pharmacist, or health care provider.  2018 Elsevier/Gold Standard (2015-07-14 09:00:04)         Docusate capsules What is this medicine? DOCUSATE (doc CUE sayt) is stool softener. It helps prevent constipation and straining or discomfort associated with hard or dry stools. This medicine may be used for other purposes; ask your health care provider or pharmacist if you have questions. COMMON BRAND NAME(S): Colace, Colace Clear, Correctol, D.O.S., DC, Doc-Q-Lace, DocuLace, Docusoft S, DOK, DOK Extra Strength, Dulcolax, Genasoft, Kao-Tin, Kaopectate Liqui-Gels, Phillips Stool Softener, Stool Softener, Stool Softner DC, Sulfolax, Sur-Q-Lax, Surfak, Uni-Ease What should I tell my health care provider before I take this  medicine? They need to know if you have any of these conditions: -nausea or vomiting -severe constipation -stomach pain -sudden change in bowel habit lasting more than 2 weeks -an unusual or allergic reaction to docusate, other medicines, foods, dyes, or preservatives -pregnant or trying to get pregnant -breast-feeding How should I use this medicine? Take this medicine by mouth  with a glass of water. Follow the directions on the label. Take your doses at regular intervals. Do not take your medicine more often than directed. Talk to your pediatrician regarding the use of this medicine in children. While this medicine may be prescribed for children as young as 2 years for selected conditions, precautions do apply. Overdosage: If you think you have taken too much of this medicine contact a poison control center or emergency room at once. NOTE: This medicine is only for you. Do not share this medicine with others. What if I miss a dose? If you miss a dose, take it as soon as you can. If it is almost time for your next dose, take only that dose. Do not take double or extra doses. What may interact with this medicine? -mineral oil This list may not describe all possible interactions. Give your health care provider a list of all the medicines, herbs, non-prescription drugs, or dietary supplements you use. Also tell them if you smoke, drink alcohol, or use illegal drugs. Some items may interact with your medicine. What should I watch for while using this medicine? Do not use for more than one week without advice from your doctor or health care professional. If your constipation returns, check with your doctor or health care professional. Drink plenty of water while taking this medicine. Drinking water helps decrease constipation. Stop using this medicine and contact your doctor or health care professional if you experience any rectal bleeding or do not have a bowel movement after use. These could be signs of a more serious condition. What side effects may I notice from receiving this medicine? Side effects that you should report to your doctor or health care professional as soon as possible: -allergic reactions like skin rash, itching or hives, swelling of the face, lips, or tongue Side effects that usually do not require medical attention (report to your doctor or health care  professional if they continue or are bothersome): -diarrhea -stomach cramps -throat irritation This list may not describe all possible side effects. Call your doctor for medical advice about side effects. You may report side effects to FDA at 1-800-FDA-1088. Where should I keep my medicine? Keep out of the reach of children. Store at room temperature between 15 and 30 degrees C (59 and 86 degrees F). Throw away any unused medicine after the expiration date. NOTE: This sheet is a summary. It may not cover all possible information. If you have questions about this medicine, talk to your doctor, pharmacist, or health care provider.  2018 Elsevier/Gold Standard (2008-02-09 15:56:49)

## 2018-09-29 ENCOUNTER — Emergency Department: Payer: Self-pay

## 2018-09-29 ENCOUNTER — Emergency Department
Admission: EM | Admit: 2018-09-29 | Discharge: 2018-09-29 | Disposition: A | Discharge status: Home / Self Care | Payer: Self-pay | Attending: Emergency Medicine | Admitting: Emergency Medicine

## 2018-09-29 ENCOUNTER — Other Ambulatory Visit: Payer: Self-pay

## 2018-09-29 DIAGNOSIS — Z85038 Personal history of other malignant neoplasm of large intestine: Secondary | ICD-10-CM | POA: Insufficient documentation

## 2018-09-29 DIAGNOSIS — F17200 Nicotine dependence, unspecified, uncomplicated: Secondary | ICD-10-CM | POA: Insufficient documentation

## 2018-09-29 DIAGNOSIS — R739 Hyperglycemia, unspecified: Secondary | ICD-10-CM | POA: Insufficient documentation

## 2018-09-29 DIAGNOSIS — Z79899 Other long term (current) drug therapy: Secondary | ICD-10-CM | POA: Insufficient documentation

## 2018-09-29 DIAGNOSIS — R4182 Altered mental status, unspecified: Secondary | ICD-10-CM | POA: Insufficient documentation

## 2018-09-29 DIAGNOSIS — I10 Essential (primary) hypertension: Secondary | ICD-10-CM | POA: Insufficient documentation

## 2018-09-29 LAB — COMPREHENSIVE METABOLIC PANEL
ALK PHOS: 83 U/L (ref 38–126)
ALT: 24 U/L (ref 0–44)
AST: 34 U/L (ref 15–41)
Albumin: 4.3 g/dL (ref 3.5–5.0)
Anion gap: 20 — ABNORMAL HIGH (ref 5–15)
BUN: 13 mg/dL (ref 6–20)
CO2: 16 mmol/L — ABNORMAL LOW (ref 22–32)
Calcium: 8.8 mg/dL — ABNORMAL LOW (ref 8.9–10.3)
Chloride: 103 mmol/L (ref 98–111)
Creatinine, Ser: 1.66 mg/dL — ABNORMAL HIGH (ref 0.61–1.24)
GFR calc Af Amer: 60 mL/min — ABNORMAL LOW (ref >60)
GFR calc non Af Amer: 52 mL/min — ABNORMAL LOW (ref >60)
GLUCOSE: 361 mg/dL — AB (ref 70–99)
Potassium: 4.1 mmol/L (ref 3.5–5.1)
Sodium: 139 mmol/L (ref 135–145)
Total Bilirubin: 0.5 mg/dL (ref 0.3–1.2)
Total Protein: 8.5 g/dL — ABNORMAL HIGH (ref 6.5–8.1)

## 2018-09-29 LAB — CBC WITH DIFFERENTIAL/PLATELET
Abs Immature Granulocytes: 0.07 10*3/uL (ref 0.00–0.07)
Basophils Absolute: 0.1 10*3/uL (ref 0.0–0.1)
Basophils Relative: 1 %
Eosinophils Absolute: 0 10*3/uL (ref 0.0–0.5)
Eosinophils Relative: 0 %
HCT: 42.8 % (ref 39.0–52.0)
HEMOGLOBIN: 11.4 g/dL — AB (ref 13.0–17.0)
Immature Granulocytes: 1 %
Lymphocytes Relative: 38 %
Lymphs Abs: 3.9 10*3/uL (ref 0.7–4.0)
MCH: 20.6 pg — ABNORMAL LOW (ref 26.0–34.0)
MCHC: 26.6 g/dL — ABNORMAL LOW (ref 30.0–36.0)
MCV: 77.3 fL — ABNORMAL LOW (ref 80.0–100.0)
Monocytes Absolute: 0.5 10*3/uL (ref 0.1–1.0)
Monocytes Relative: 5 %
Neutro Abs: 5.6 10*3/uL (ref 1.7–7.7)
Neutrophils Relative %: 55 %
Platelets: 237 10*3/uL (ref 150–400)
RBC: 5.54 MIL/uL (ref 4.22–5.81)
RDW: 21 % — ABNORMAL HIGH (ref 11.5–15.5)
WBC: 10.1 10*3/uL (ref 4.0–10.5)
nRBC: 0.2 % (ref 0.0–0.2)

## 2018-09-29 LAB — URINE DRUG SCREEN, QUALITATIVE (ARMC ONLY)
Amphetamines, Ur Screen: NOT DETECTED
Barbiturates, Ur Screen: NOT DETECTED
Benzodiazepine, Ur Scrn: NOT DETECTED
Cannabinoid 50 Ng, Ur ~~LOC~~: POSITIVE — AB
Cocaine Metabolite,Ur ~~LOC~~: POSITIVE — AB
MDMA (Ecstasy)Ur Screen: NOT DETECTED
Methadone Scn, Ur: NOT DETECTED
OPIATE, UR SCREEN: NOT DETECTED
Phencyclidine (PCP) Ur S: NOT DETECTED
Tricyclic, Ur Screen: NOT DETECTED

## 2018-09-29 LAB — GLUCOSE, CAPILLARY
Glucose-Capillary: 293 mg/dL — ABNORMAL HIGH (ref 70–99)
Glucose-Capillary: 68 mg/dL — ABNORMAL LOW (ref 70–99)

## 2018-09-29 LAB — POCT I-STAT, CHEM 8
BUN: 13 mg/dL (ref 6–20)
CHLORIDE: 110 mmol/L (ref 98–111)
Calcium, Ion: 1.09 mmol/L — ABNORMAL LOW (ref 1.15–1.40)
Creatinine, Ser: 1.3 mg/dL — ABNORMAL HIGH (ref 0.61–1.24)
Glucose, Bld: 77 mg/dL (ref 70–99)
HCT: 41 % (ref 39.0–52.0)
Hemoglobin: 13.9 g/dL (ref 13.0–17.0)
Potassium: 4.5 mmol/L (ref 3.5–5.1)
Sodium: 141 mmol/L (ref 135–145)
TCO2: 23 mmol/L (ref 22–32)

## 2018-09-29 LAB — ETHANOL: Alcohol, Ethyl (B): 90 mg/dL — ABNORMAL HIGH (ref <10)

## 2018-09-29 MED ORDER — SODIUM CHLORIDE 0.9 % IV SOLN
1000.0000 mL | Freq: Once | INTRAVENOUS | Status: AC
  Administered 2018-09-29: 1000 mL via INTRAVENOUS

## 2018-09-29 MED ORDER — LORAZEPAM 2 MG/ML IJ SOLN
2.0000 mg | Freq: Once | INTRAMUSCULAR | Status: AC
  Administered 2018-09-29: 2 mg via INTRAVENOUS

## 2018-09-29 MED ORDER — LORAZEPAM 2 MG/ML IJ SOLN: 2.0000 mg | Freq: Once | INTRAMUSCULAR | Status: DC

## 2018-09-29 NOTE — ED Notes (Signed)
[phone], Evelyn-mother of patient

## 2018-09-29 NOTE — ED Notes (Signed)
Bare hugger placed on patient.

## 2018-09-29 NOTE — ED Notes (Signed)
ED Provider at bedside. 

## 2018-09-29 NOTE — ED Triage Notes (Signed)
To ER with [facility] from home where patient was found, unsresponsive with snoring respirations per report. Pt pupils pinpoint upon EMS arrival. Pt received 2mg  narcan intranasally and 2mg  IV. Pt combative with EMS after awakening. Pt nonverbal, appears confused. Pt does follow instructions to get on bed from stretcher. Pt does not follow any other instructions.

## 2018-09-29 NOTE — ED Notes (Signed)
Mother called for ride home, states that uncle is on way to get patient. Pt up to urinate. Pt alert and oriented X4, active, cooperative, pt in NAD. RR even and unlabored, color WNL.

## 2018-09-29 NOTE — ED Notes (Signed)
Pt on EMS stretcher and is able to move to hospital stretcher with verbal directions. Pt gets in hospital stretcher and is sitting straight up, appears confused and does not follow directions. EDP at bedside. 2mg  IV ativan ordered.

## 2018-09-29 NOTE — ED Notes (Signed)
Pt left with family member.

## 2018-09-29 NOTE — ED Notes (Signed)
Awaiting for mother to arrive to discharge patient safely.

## 2018-09-29 NOTE — ED Notes (Signed)
Pt given sandwich tray and sprite. Sitting up and eating independently at this time.

## 2018-09-29 NOTE — ED Notes (Signed)
Pt puked up a large amount of brown emesis. Denies abdominal pain.

## 2018-09-29 NOTE — ED Notes (Signed)
Gold colored cross necklace at pt bedside.

## 2018-09-29 NOTE — ED Notes (Signed)
Mother and father at bedside. States that patient took an "e-pill" last night for the first time. Pt found naked on floor this morning with snoring respirations.

## 2018-09-29 NOTE — ED Notes (Signed)
Pt awake at this time. Alert and oriented X4. Given water. EDP aware.

## 2018-09-29 NOTE — ED Notes (Signed)
Pt abe to eat and drink without emesis. Ambulates safely. Pt alert and oriented X4, active, cooperative, pt in NAD. RR even and unlabored, color WNL.

## 2018-09-29 NOTE — ED Notes (Signed)
Pt alert and oriented X4, active, cooperative, pt in NAD. RR even and unlabored, color WNL.  Pt informed to return if any life threatening symptoms occur.  Discharge and followup instructions reviewed.  

## 2018-09-29 NOTE — ED Provider Notes (Signed)
[facility] Emergency Department Provider Note   ____________________________________________    I have reviewed the triage vital signs and the nursing notes.   HISTORY  Chief Complaint Altered Mental Status  Altered mental status   HPI Luke [name] is a 37 y.o. male who presents with altered mental status.  Per EMS the patient was found by family unresponsive this morning.  Unclear whether drugs or alcohol may have been involved.  EMS gave Narcan with possibly some mild improvement.  Patient is unable to provide any history   Past Medical History:  Diagnosis Date  . Anemia   . Cancer ([place])    colon ca  . Cecum mass   . Colonic neoplasm   . Diverticulosis of large intestine without diverticulitis   . GERD (gastroesophageal reflux disease)   . Hypertension   . Pneumonia 09/07/2016  . Stomach irritation     Patient Active Problem List   Diagnosis Date Noted  . Hypertension 08/17/2018  . Columnar-lined esophagus   . Gastric polyp   . Iron deficiency anemia 07/23/2018  . B12 deficiency 07/23/2018    Past Surgical History:  Procedure Laterality Date  . COLONOSCOPY WITH PROPOFOL N/A 07/28/2018   Procedure: COLONOSCOPY WITH PROPOFOL;  Surgeon: Virgel Manifold, MD;  Location: [facility] ENDOSCOPY;  Service: Endoscopy;  Laterality: N/A;  . ESOPHAGOGASTRODUODENOSCOPY (EGD) WITH PROPOFOL N/A 07/28/2018   Procedure: ESOPHAGOGASTRODUODENOSCOPY (EGD) WITH PROPOFOL;  Surgeon: Virgel Manifold, MD;  Location: [facility] ENDOSCOPY;  Service: Endoscopy;  Laterality: N/A;  . none    . PARTIAL COLECTOMY Right 08/15/2018   Procedure: RIGHT  COLECTOMY;  Surgeon: Vickie Epley, MD;  Location: [facility] ORS;  Service: General;  Laterality: Right;    Prior to Admission medications   Medication Sig Start Date End Date Taking? Authorizing Provider  amLODipine (NORVASC) 5 MG tablet Take 1 tablet (5 mg total) by mouth daily. Patient not taking: Reported on  09/13/2018 08/19/18   Tylene Fantasia, PA-C  bisacodyl (DULCOLAX) 5 MG EC tablet Take all 4 tablets at 8 am the day before your surgery Patient not taking: Reported on 09/13/2018 08/09/18   Vickie Epley, MD  Cyanocobalamin (VITAMIN B-12 PO) Take 1 tablet by mouth daily.    [provider]  docusate sodium (COLACE) 100 MG capsule Take 1 capsule (100 mg total) by mouth 2 (two) times daily. 09/13/18   Azzie Glatter, FNP  erythromycin base (E-MYCIN) 500 MG tablet Take 1 tablet (500 mg total) by mouth as directed. Patient not taking: Reported on 09/13/2018 08/09/18   Vickie Epley, MD  IRON PO Take 1 tablet by mouth daily.    [provider]  neomycin (MYCIFRADIN) 500 MG tablet Take 1 tablet (500 mg total) by mouth See admin instructions. Take 1 tablet at 8am, 2pm, and 8pm the day prior to your surgery Patient not taking: Reported on 09/13/2018 08/09/18   Vickie Epley, MD  oxyCODONE (OXY IR/ROXICODONE) 5 MG immediate release tablet Take 1-2 tablets (5-10 mg total) by mouth every 4 (four) hours as needed for moderate pain. 08/18/18   Tylene Fantasia, PA-C  pantoprazole (PROTONIX) 40 MG tablet Take 1 tablet (40 mg total) by mouth 2 (two) times daily. 07/05/18   Mayo, Pete Pelt, MD  traMADol (ULTRAM) 50 MG tablet Take 1 tablet (50 mg total) by mouth every 6 (six) hours as needed. 09/13/18   Azzie Glatter, FNP     Allergies Patient has no  known allergies.  Family History  Problem Relation Age of Onset  . CAD Maternal Grandmother   . Breast cancer Maternal Grandmother   . Breast cancer Mother   . Hypertension Mother   . Hypertension Father   . Colon cancer Paternal Uncle     Social History Social History   Tobacco Use  . Smoking status: Current Every Day Smoker    Packs/day: 0.10  . Smokeless tobacco: Never Used  . Tobacco comment: patient is social smoker/ smokes when he drinks   Substance Use Topics  . Alcohol use: Yes    Alcohol/week: 1.0 standard  drinks    Types: 1 Cans of beer per week    Comment: patient reports he consumes ~2 beers about once per month  . Drug use: Not Currently    Types: Marijuana    Level 5 caveat: Unable to obtain review of Systems     ____________________________________________   PHYSICAL EXAM:  VITAL SIGNS: ED Triage Vitals  Enc Vitals Group     BP      Pulse      Resp      Temp      Temp src      SpO2      Weight      Height      Head Circumference      Peak Flow      Pain Score      Pain Loc      Pain Edu?      Excl. in [place]?     Constitutional: Disoriented, confused, waving his arms but nothing Eyes: Conjunctivae are normal.  Pupils appear normal Head: Atraumatic. Nose: No congestion/rhinnorhea. Mouth/Throat: Mucous membranes are moist.    Cardiovascular: Normal rate, regular rhythm. Grossly normal heart sounds.  Good peripheral circulation. Respiratory: Normal respiratory effort.  No retractions. Lungs CTAB. Gastrointestinal: Soft and nontender. No distention.  Musculoskeletal: No lower extremity tenderness nor edema.  Warm and well perfused Neurologic:   No gross focal neurologic deficits are appreciated.  Skin:  Skin is warm, dry and intact. No rash noted.   ____________________________________________   LABS (all labs ordered are listed, but only abnormal results are displayed)  Labs Reviewed  CBC WITH DIFFERENTIAL/PLATELET - Abnormal; Notable for the following components:      Result Value   Hemoglobin 11.4 (*)    MCV 77.3 (*)    MCH 20.6 (*)    MCHC 26.6 (*)    RDW 21.0 (*)    All other components within normal limits  COMPREHENSIVE METABOLIC PANEL - Abnormal; Notable for the following components:   CO2 16 (*)    Glucose, Bld 361 (*)    Creatinine, Ser 1.66 (*)    Calcium 8.8 (*)    Total Protein 8.5 (*)    GFR calc non Af Amer 52 (*)    GFR calc Af Amer 60 (*)    Anion gap 20 (*)    All other components within normal limits  URINE DRUG SCREEN,  QUALITATIVE ([facility] ONLY) - Abnormal; Notable for the following components:   Cocaine Metabolite,Ur [place] POSITIVE (*)    Cannabinoid 50 Ng, Ur [place] POSITIVE (*)    All other components within normal limits  GLUCOSE, CAPILLARY - Abnormal; Notable for the following components:   Glucose-Capillary 293 (*)    All other components within normal limits  ETHANOL - Abnormal; Notable for the following components:   Alcohol, Ethyl (B) 90 (*)    All other  components within normal limits  GLUCOSE, CAPILLARY - Abnormal; Notable for the following components:   Glucose-Capillary 68 (*)    All other components within normal limits  POCT I-STAT, CHEM 8 - Abnormal; Notable for the following components:   Creatinine, Ser 1.30 (*)    Calcium, Ion 1.09 (*)    All other components within normal limits  CBG MONITORING, ED  I-STAT CHEM 8, ED   ____________________________________________  EKG  ED ECG REPORT I, Lavonia Drafts, the attending physician, personally viewed and interpreted this ECG.  Date: 09/29/2018  Rhythm: normal sinus rhythm QRS Axis: normal Intervals: normal ST/T Wave abnormalities: normal Narrative Interpretation: no evidence of acute ischemia  ____________________________________________  RADIOLOGY  Chest x-ray normal, no aspiration CT head unremarkable ____________________________________________   PROCEDURES  Procedure(s) performed: No  Procedures   Critical Care performed: No ____________________________________________   INITIAL IMPRESSION / ASSESSMENT AND PLAN / ED COURSE  Pertinent labs & imaging results that were available during my care of the patient were reviewed by me and considered in my medical decision making (see chart for details).  Patient with altered mental status, somewhat combative and trying to climb out of his bed.  Very confused.  IV Ativan ordered with good response.  Strongly suspect substance abuse is the cause of his altered mental status but  pending labs including ethanol, urine drug screen, CT head, chest x-ray.  The patient does have a history of aspiration pneumonia secondary to severe alcohol intoxication in the past per medical records  ----------------------------------------- 9:57 AM on 09/29/2018 -----------------------------------------  Mother reports that he took "in E-pill "last night which is apparently ecstasy  ----------------------------------------- 12:30 PM on 09/29/2018 -----------------------------------------  Patient sleeping, snoring, vital stable  Patient with elevated glucose and elevated anion gap bicarb 16 no diagnosis of diabetes.  Reportedly prediabetic.  We will give additional IV fluids recheck i-STAT BMP  Repeat BMP demonstrates normalized CO2, normalized anion gap, glucose is also normalized.  Patient is appropriate for discharge at this point.  I have recommended that he follow-up with PCP for further evaluation of possible diabetes.  Given that sugar has been completely normalized will hold off on starting metformin    ____________________________________________   FINAL CLINICAL IMPRESSION(S) / ED DIAGNOSES  Final diagnoses:  Altered mental status, unspecified altered mental status type  Hyperglycemia        Note:  This document was prepared using Dragon voice recognition software and may include unintentional dictation errors.    Lavonia Drafts, MD 09/29/18 [phone]

## 2018-09-29 NOTE — ED Notes (Signed)
Patient transported to CT 

## 2018-11-08 ENCOUNTER — Other Ambulatory Visit: Payer: Self-pay

## 2018-11-08 ENCOUNTER — Ambulatory Visit: Payer: Self-pay | Admitting: Surgery

## 2018-11-10 ENCOUNTER — Inpatient Hospital Stay: Payer: Self-pay | Attending: Hematology and Oncology

## 2018-11-10 ENCOUNTER — Other Ambulatory Visit: Payer: Self-pay

## 2018-11-10 DIAGNOSIS — E538 Deficiency of other specified B group vitamins: Secondary | ICD-10-CM | POA: Insufficient documentation

## 2018-11-10 DIAGNOSIS — F17210 Nicotine dependence, cigarettes, uncomplicated: Secondary | ICD-10-CM | POA: Insufficient documentation

## 2018-11-10 DIAGNOSIS — I129 Hypertensive chronic kidney disease with stage 1 through stage 4 chronic kidney disease, or unspecified chronic kidney disease: Secondary | ICD-10-CM | POA: Insufficient documentation

## 2018-11-10 DIAGNOSIS — K219 Gastro-esophageal reflux disease without esophagitis: Secondary | ICD-10-CM | POA: Insufficient documentation

## 2018-11-10 DIAGNOSIS — D509 Iron deficiency anemia, unspecified: Secondary | ICD-10-CM

## 2018-11-10 DIAGNOSIS — Z79899 Other long term (current) drug therapy: Secondary | ICD-10-CM | POA: Insufficient documentation

## 2018-11-10 DIAGNOSIS — N189 Chronic kidney disease, unspecified: Secondary | ICD-10-CM | POA: Insufficient documentation

## 2018-11-10 LAB — BASIC METABOLIC PANEL
Anion gap: 7 (ref 5–15)
BUN: 11 mg/dL (ref 6–20)
CO2: 27 mmol/L (ref 22–32)
Calcium: 9.1 mg/dL (ref 8.9–10.3)
Chloride: 106 mmol/L (ref 98–111)
Creatinine, Ser: 1.37 mg/dL — ABNORMAL HIGH (ref 0.61–1.24)
GFR calc Af Amer: >60 mL/min (ref >60)
GFR calc non Af Amer: >60 mL/min (ref >60)
Glucose, Bld: 98 mg/dL (ref 70–99)
Potassium: 4 mmol/L (ref 3.5–5.1)
Sodium: 140 mmol/L (ref 135–145)

## 2018-11-10 LAB — CBC WITH DIFFERENTIAL/PLATELET
Abs Immature Granulocytes: 0.01 10*3/uL (ref 0.00–0.07)
Basophils Absolute: 0.1 10*3/uL (ref 0.0–0.1)
Basophils Relative: 2 %
Eosinophils Absolute: 0.1 10*3/uL (ref 0.0–0.5)
Eosinophils Relative: 3 %
HCT: 42 % (ref 39.0–52.0)
Hemoglobin: 11.7 g/dL — ABNORMAL LOW (ref 13.0–17.0)
Immature Granulocytes: 0 %
Lymphocytes Relative: 42 %
Lymphs Abs: 2.3 10*3/uL (ref 0.7–4.0)
MCH: 20.1 pg — ABNORMAL LOW (ref 26.0–34.0)
MCHC: 27.9 g/dL — ABNORMAL LOW (ref 30.0–36.0)
MCV: 72.2 fL — ABNORMAL LOW (ref 80.0–100.0)
Monocytes Absolute: 0.4 10*3/uL (ref 0.1–1.0)
Monocytes Relative: 9 %
Neutro Abs: 2.3 10*3/uL (ref 1.7–7.7)
Neutrophils Relative %: 44 %
Platelets: 277 10*3/uL (ref 150–400)
RBC: 5.82 MIL/uL — ABNORMAL HIGH (ref 4.22–5.81)
RDW: 21.2 % — ABNORMAL HIGH (ref 11.5–15.5)
WBC: 5.2 10*3/uL (ref 4.0–10.5)
nRBC: 0 % (ref 0.0–0.2)

## 2018-11-10 LAB — FERRITIN: Ferritin: 4 ng/mL — ABNORMAL LOW (ref 24–336)

## 2018-11-10 LAB — VITAMIN B12: Vitamin B-12: 910 pg/mL (ref 180–914)

## 2018-11-11 ENCOUNTER — Encounter: Payer: Self-pay | Admitting: Hematology and Oncology

## 2018-11-11 ENCOUNTER — Inpatient Hospital Stay: Payer: Self-pay

## 2018-11-11 ENCOUNTER — Inpatient Hospital Stay (HOSPITAL_BASED_OUTPATIENT_CLINIC_OR_DEPARTMENT_OTHER): Payer: Self-pay | Admitting: Hematology and Oncology

## 2018-11-11 VITALS — BP 129/85 | HR 74 | Temp 98.6°F | Resp 18 | Ht 71.0 in | Wt 190.0 lb

## 2018-11-11 DIAGNOSIS — N289 Disorder of kidney and ureter, unspecified: Secondary | ICD-10-CM

## 2018-11-11 DIAGNOSIS — E538 Deficiency of other specified B group vitamins: Secondary | ICD-10-CM

## 2018-11-11 DIAGNOSIS — K6389 Other specified diseases of intestine: Secondary | ICD-10-CM

## 2018-11-11 DIAGNOSIS — D509 Iron deficiency anemia, unspecified: Secondary | ICD-10-CM

## 2018-11-11 DIAGNOSIS — N189 Chronic kidney disease, unspecified: Secondary | ICD-10-CM

## 2018-11-11 NOTE — Progress Notes (Signed)
[facility] day:  11/11/2018  Chief Complaint: Luke [name] is a 38 y.o. male with iron deficiency anemia and B12 deficiency who is seen for 3 month assessment.  HPI:  The patient was last seen in the hematology clinic on 08/04/2018 for initial consultation.  He had iron deficiency anemia felt secondary to a colonic mass.  He was to follow-up with Dr. Tama High regarding a resection.  In addition, he had B12 deficiency and renal insufficiency.  Labs revealed a hematocrit of 34.2, hemoglobin 9.8, and MCV 68.3.  Ferritin was 11.  Intrinsic factor and anti-parietal antibodies were negative.  He underwent right colectomy on 08/15/2018 by Dr. Tama High.  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  There was a 1.2 cm pedunculated adenoma in the ascending colon.  There was an incidental hyperplastic polyp in the appendix.  There were 20 regional lymph nodes without pathologic change.  Labs on 11/10/2018 revealed a hematocrit of 42.0, hemoglobin 11.7, and MCV 72.2.  Ferritin was 4.  B12 was 910.  Creatinine was 1.37.  During the interim, patient is doing "ok". He denies any acute complaints. Patient not experienced any nausea or vomiting. He has daily issues with constipation. Patient remains "cold and fatigue".  Patient denies that he has experienced any B symptoms. He denies any interval infections. Patient denies bleeding; no hematochezia, melena, or gross hematuria.   Patient advises that he maintains an adequate appetite. He is eating well. Weight today is 190 lb (86.2 kg), which compared to his last visit to the clinic, represents a 7 pound increase.  He continues to take oral B12 and iron daily.  Patient denies pain in the clinic today.   Past Medical History:  Diagnosis Date  . Anemia   . Cancer ([facility])    colon ca  . Cecum mass   . Colonic neoplasm   . Diverticulosis of large  intestine without diverticulitis   . GERD (gastroesophageal reflux disease)   . Hypertension   . Pneumonia 09/07/2016  . Stomach irritation     Past Surgical History:  Procedure Laterality Date  . COLONOSCOPY WITH PROPOFOL N/A 07/28/2018   Procedure: COLONOSCOPY WITH PROPOFOL;  Surgeon: Virgel Manifold, MD;  Location: [facility];  Service: Endoscopy;  Laterality: N/A;  . ESOPHAGOGASTRODUODENOSCOPY (EGD) WITH PROPOFOL N/A 07/28/2018   Procedure: ESOPHAGOGASTRODUODENOSCOPY (EGD) WITH PROPOFOL;  Surgeon: Virgel Manifold, MD;  Location: [facility] ENDOSCOPY;  Service: Endoscopy;  Laterality: N/A;  . none    . PARTIAL COLECTOMY Right 08/15/2018   Procedure: RIGHT  COLECTOMY;  Surgeon: Vickie Epley, MD;  Location: [facility] ORS;  Service: General;  Laterality: Right;    Family History  Problem Relation Age of Onset  . CAD Maternal Grandmother   . Breast cancer Maternal Grandmother   . Breast cancer Mother   . Hypertension Mother   . Hypertension Father   . Colon cancer Paternal Uncle     Social History:  reports that he has been smoking. He has been smoking about 0.10 packs per day. He has never used smokeless tobacco. He reports current alcohol use of about 1.0 standard drinks of alcohol per week. He reports previous drug use. Drug: Marijuana.  He has experimented with cocaine and marijuana, but does not use theses substances regularly. He drinks alcohol "during football season".   Patient is not currently working.  Allergies: No Known Allergies  Current  Medications: Current Outpatient Medications  Medication Sig Dispense Refill  . Cyanocobalamin (VITAMIN B-12 PO) Take 1 tablet by mouth daily.    Marland Kitchen docusate sodium (COLACE) 100 MG capsule Take 1 capsule (100 mg total) by mouth 2 (two) times daily. 10 capsule 10  . IRON PO Take 1 tablet by mouth daily.    Marland Kitchen oxyCODONE (OXY IR/ROXICODONE) 5 MG immediate release tablet Take 1-2 tablets (5-10 mg total) by mouth every 4 (four) hours as  needed for moderate pain. 30 tablet 0  . pantoprazole (PROTONIX) 40 MG tablet Take 1 tablet (40 mg total) by mouth 2 (two) times daily. 60 tablet 0  . amLODipine (NORVASC) 5 MG tablet Take 1 tablet (5 mg total) by mouth daily. (Patient not taking: Reported on 09/13/2018) 30 tablet 3  . bisacodyl (DULCOLAX) 5 MG EC tablet Take all 4 tablets at 8 am the day before your surgery (Patient not taking: Reported on 09/13/2018) 4 tablet 0  . erythromycin base (E-MYCIN) 500 MG tablet Take 1 tablet (500 mg total) by mouth as directed. (Patient not taking: Reported on 09/13/2018) 2 tablet 0  . neomycin (MYCIFRADIN) 500 MG tablet Take 1 tablet (500 mg total) by mouth See admin instructions. Take 1 tablet at 8am, 2pm, and 8pm the day prior to your surgery (Patient not taking: Reported on 09/13/2018) 2 tablet 0  . traMADol (ULTRAM) 50 MG tablet Take 1 tablet (50 mg total) by mouth every 6 (six) hours as needed. (Patient not taking: Reported on 11/11/2018) 30 tablet 0   No current facility-administered medications for this visit.     Review of Systems:  GENERAL:  Doing "alright".  No fevers, sweats.  Weight gain of 7 pounds. PERFORMANCE STATUS (ECOG):  1 HEENT:  No visual changes, runny nose, sore throat, mouth sores or tenderness. Lungs: No shortness of breath or cough.  No hemoptysis. Cardiac:  No chest pain, palpitations, orthopnea, or PND. GI:  Abdominal discomfort with lifting.  No nausea, vomiting, diarrhea, constipation, melena or hematochezia. GU:  No urgency, frequency, dysuria, or hematuria. Musculoskeletal:  No back pain.  No joint pain.  No muscle tenderness. Extremities:  No pain or swelling. Skin:  No rashes or skin changes. Neuro:  No headache, numbness or weakness, balance or coordination issues. Endocrine:  No diabetes, thyroid issues, hot flashes or night sweats.  Psych:  No mood changes, depression or anxiety. Pain:  No focal pain. Review of systems:  All other systems reviewed and found  to be negative.   Physical Exam: Blood pressure 129/85, pulse 74, temperature 98.6 F (37 C), temperature source Tympanic, resp. rate 18, height 5\' 11"  (1.803 m), weight 190 lb (86.2 kg), SpO2 98 %. GENERAL:  Well developed, well nourished, gentleman sitting comfortably in the exam room in no acute distress. MENTAL STATUS:  Alert and oriented to person, place and time. HEAD:  Short black hair.  Normocephalic, atraumatic, face symmetric, no Cushingoid features. EYES:  Brown eyes.  Pupils equal round and reactive to light and accomodation.  No conjunctivitis or scleral icterus. ENT:  Oropharynx clear without lesion.  Tongue normal. Mucous membranes moist.  RESPIRATORY:  Clear to auscultation without rales, wheezes or rhonchi. CARDIOVASCULAR:  Regular rate and rhythm without murmur, rub or gallop. ABDOMEN:  Soft, slightly tender RLQ without guarding or rebound tenderness.  Active bowel sounds and no hepatosplenomegaly.  No masses. SKIN:  No rashes, ulcers or lesions. EXTREMITIES: No edema, no skin discoloration or tenderness.  No palpable cords. LYMPH NODES:  No palpable cervical, supraclavicular, axillary or inguinal adenopathy  NEUROLOGICAL: Unremarkable. PSYCH:  Appropriate.    Orders Only on 11/10/2018  Component Date Value Ref Range Status  . Vitamin B-12 11/10/2018 910  180 - 914 pg/mL Final   Comment: (NOTE) This assay is not validated for testing neonatal or myeloproliferative syndrome specimens for Vitamin B12 levels. Performed at [facility], [place] [address] [place], [place] [address]   . WBC 11/10/2018 5.2  4.0 - 10.5 K/uL Final   WHITE COUNT CONFIRMED ON SMEAR  . RBC 11/10/2018 5.82* 4.22 - 5.81 MIL/uL Final  . Hemoglobin 11/10/2018 11.7* 13.0 - 17.0 g/dL Final  . HCT 11/10/2018 42.0  39.0 - 52.0 % Final  . MCV 11/10/2018 72.2* 80.0 - 100.0 fL Final  . MCH 11/10/2018 20.1* 26.0 - 34.0 pg Final  . MCHC 11/10/2018 27.9* 30.0 - 36.0 g/dL Final  . RDW 11/10/2018 21.2*  11.5 - 15.5 % Final  . Platelets 11/10/2018 277  150 - 400 K/uL Final  . nRBC 11/10/2018 0.0  0.0 - 0.2 % Final  . Neutrophils Relative % 11/10/2018 44  % Final  . Neutro Abs 11/10/2018 2.3  1.7 - 7.7 K/uL Final  . Lymphocytes Relative 11/10/2018 42  % Final  . Lymphs Abs 11/10/2018 2.3  0.7 - 4.0 K/uL Final  . Monocytes Relative 11/10/2018 9  % Final  . Monocytes Absolute 11/10/2018 0.4  0.1 - 1.0 K/uL Final  . Eosinophils Relative 11/10/2018 3  % Final  . Eosinophils Absolute 11/10/2018 0.1  0.0 - 0.5 K/uL Final  . Basophils Relative 11/10/2018 2  % Final  . Basophils Absolute 11/10/2018 0.1  0.0 - 0.1 K/uL Final  . WBC Morphology 11/10/2018 DIFF CONFIRMED BY MANUAL   Final  . Immature Granulocytes 11/10/2018 0  % Final  . Abs Immature Granulocytes 11/10/2018 0.01  0.00 - 0.07 K/uL Final   Performed at [facility], [address] [place], [place] [address]  . Sodium 11/10/2018 140  135 - 145 mmol/L Final  . Potassium 11/10/2018 4.0  3.5 - 5.1 mmol/L Final  . Chloride 11/10/2018 106  98 - 111 mmol/L Final  . CO2 11/10/2018 27  22 - 32 mmol/L Final  . Glucose, Bld 11/10/2018 98  70 - 99 mg/dL Final  . BUN 11/10/2018 11  6 - 20 mg/dL Final  . Creatinine, Ser 11/10/2018 1.37* 0.61 - 1.24 mg/dL Final  . Calcium 11/10/2018 9.1  8.9 - 10.3 mg/dL Final  . GFR calc non Af Amer 11/10/2018 >60  >60 mL/min Final  . GFR calc Af Amer 11/10/2018 >60  >60 mL/min Final  . Anion gap 11/10/2018 7  5 - 15 Final   Performed at [facility], [address] [place], [place] [address]  . Ferritin 11/10/2018 4* 24 - 336 ng/mL Final   Performed at [facility], [address] [place], [place] [address]    Assessment:  Luke [name] is a 38 y.o. male  with iron deficiency anemia.  He has a history of bleeding hemorrhoids.  Urinalysis reveals no hematuria.  Diet is good.  He is on oral iron.  Ferritin has been followed:  2 on 07/03/2018 and 7 on 11/10/2018.  EGD on 07/28/2018  revealed esophageal mucosal changes suspicious for short-segment Barrett's esophagus.  There was erythematous mucosa in the antrum. There was a few gastric polyps.  Small bowel biopsy was negative.  There was mild reactive gastropathy with minimal chronic  inactive inflammation.  GE junction revealed squamocolumnar mucosa with chronic inflammation.  Colonoscopy on 07/28/2018 revealed a 20 cm (length) frond-like/villous non-obstructing large mass in the cecum.  The mass was non-circumferential. No bleeding was present. The area was tattooed with an injection of Spot (carbon black). The appendiceal orifice appeared normal. It was unclear if the mass was originating from the cecum itself or if the ileocecal valve was involved.  Due to the large size of the mass, the ileocecal valve or terminal ileum could not be examined.  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  There was no high grade dysplasia or malignancy.  CEA was 2.8 on 08/04/2018 and 2.7 on 08/12/2018.  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  There was a 1.2 cm pedunculated adenoma in the ascending colon.  There was an incidental hyperplastic polyp in the appendix.  There were 20 regional lymph nodes without pathologic change.  He has B12 deficiency.  B12 was 244 (low) and folate 9.1 on 07/03/2018.  He is on oral B12.  Intrinsic factor and anti-parietal cell antibodies were negative.  B12 was 910 on 11/10/2018.  TSH was normal.  He has chronic renal insufficiency.  Creatinine has ranged between 1.33 - 1.94 over the past 5 years.  Creatinine was 1.66 on 07/05/2018.   Symptomatically, he is doing well.  He is recovering well from surgery.  He denies any melena or hematochezia.  Exam reveals a slightly tender RLQ without guarding or rebound tenderness.  He denies any melena or hematochezia.  Hemoglobin 11.7.  Ferritin is 4.  Plan: 1.   Review labs from  08/04/2018 and 11/10/2018. 2.   Iron deficiency anemia  Etiology secondary to colonic mass, now resected.  Hematocrit 42.0.  Hemoglobin 11.7.  MCV 72.2.  Ferritin 4.  Continue oral iron with orange juice or vitamin C. 3.   B12 deficiency  Patient has been taking oral B12.  B12 level was 910 on 11/10/2018.  Continue oral B12 supplementation. 4.   Cecal mass  Large cecal mass status post right colectomy.   Pathology reviewed.  Villous adenoma.    No additional treatment needed.  Follow-up with Dr. Tama High.  Anticipate follow-up colonoscopy next year. 5.   Renal insufficiency  BUN 11 and creatinine 1.37.  Follow-up with nephrology. 6.   RTC in 3 month for MD assessment, labs (CBC with diff, BMP, ferritin, B12), and +/- Venofer.   Honor Loh, NP  11/11/2018, 1:56 PM   I saw and evaluated the patient, participating in the key portions of the service and reviewing pertinent diagnostic studies and records.  I reviewed the nurse practitioner's note and agree with the findings and the plan.  The assessment and plan were discussed with the patient.  Several questions were asked by the patient and answered.   Nolon Stalls, MD 11/11/2018,1:56 PM

## 2018-11-11 NOTE — Progress Notes (Signed)
No new changes noted today 

## 2018-11-14 ENCOUNTER — Telehealth: Payer: Self-pay

## 2018-11-14 NOTE — Telephone Encounter (Signed)
Faxed new patient referral to [place] / for Dr Philipp [facility] office Merilyn Baba  has been received in office. [place] office staff confirmed.

## 2018-11-18 ENCOUNTER — Telehealth: Payer: Self-pay

## 2018-11-18 NOTE — Telephone Encounter (Signed)
Contacted the patient to inform him Luke [name] appointment with [facility] 12/22/18 @ 10:20 AM.The patient was understanding and agreeable to keep schedule appointment.

## 2018-12-14 ENCOUNTER — Telehealth: Payer: Self-pay | Admitting: *Deleted

## 2018-12-14 NOTE — Telephone Encounter (Signed)
I called to reschedule appointment and he states he is having problems with his kidneys and will call back once things have cleared up. No problems with hernia at this time.

## 2019-02-08 ENCOUNTER — Other Ambulatory Visit: Payer: Self-pay

## 2019-02-08 DIAGNOSIS — E538 Deficiency of other specified B group vitamins: Secondary | ICD-10-CM

## 2019-02-08 DIAGNOSIS — D509 Iron deficiency anemia, unspecified: Secondary | ICD-10-CM

## 2019-02-09 ENCOUNTER — Other Ambulatory Visit: Payer: Self-pay

## 2019-02-09 ENCOUNTER — Inpatient Hospital Stay: Payer: Self-pay | Attending: Hematology and Oncology

## 2019-02-10 ENCOUNTER — Inpatient Hospital Stay: Payer: Self-pay

## 2019-02-10 ENCOUNTER — Inpatient Hospital Stay: Payer: Self-pay | Admitting: Oncology

## 2019-03-08 ENCOUNTER — Other Ambulatory Visit: Payer: Self-pay

## 2019-03-09 ENCOUNTER — Other Ambulatory Visit: Payer: Self-pay

## 2019-03-09 ENCOUNTER — Inpatient Hospital Stay: Payer: Self-pay | Attending: Hematology and Oncology | Admitting: *Deleted

## 2019-03-09 DIAGNOSIS — D120 Benign neoplasm of cecum: Secondary | ICD-10-CM | POA: Insufficient documentation

## 2019-03-09 DIAGNOSIS — D509 Iron deficiency anemia, unspecified: Secondary | ICD-10-CM | POA: Insufficient documentation

## 2019-03-09 DIAGNOSIS — I129 Hypertensive chronic kidney disease with stage 1 through stage 4 chronic kidney disease, or unspecified chronic kidney disease: Secondary | ICD-10-CM | POA: Insufficient documentation

## 2019-03-09 DIAGNOSIS — Z803 Family history of malignant neoplasm of breast: Secondary | ICD-10-CM | POA: Insufficient documentation

## 2019-03-09 DIAGNOSIS — Z800 Family history of malignant neoplasm of digestive organs: Secondary | ICD-10-CM | POA: Insufficient documentation

## 2019-03-09 DIAGNOSIS — Z9049 Acquired absence of other specified parts of digestive tract: Secondary | ICD-10-CM | POA: Insufficient documentation

## 2019-03-09 DIAGNOSIS — E538 Deficiency of other specified B group vitamins: Secondary | ICD-10-CM | POA: Insufficient documentation

## 2019-03-09 DIAGNOSIS — F17210 Nicotine dependence, cigarettes, uncomplicated: Secondary | ICD-10-CM | POA: Insufficient documentation

## 2019-03-09 DIAGNOSIS — N189 Chronic kidney disease, unspecified: Secondary | ICD-10-CM | POA: Insufficient documentation

## 2019-03-09 LAB — CBC WITH DIFFERENTIAL/PLATELET
Abs Immature Granulocytes: 0.01 10*3/uL (ref 0.00–0.07)
Basophils Absolute: 0.1 10*3/uL (ref 0.0–0.1)
Basophils Relative: 1 %
Eosinophils Absolute: 0.2 10*3/uL (ref 0.0–0.5)
Eosinophils Relative: 3 %
HCT: 44.8 % (ref 39.0–52.0)
Hemoglobin: 13.5 g/dL (ref 13.0–17.0)
Immature Granulocytes: 0 %
Lymphocytes Relative: 40 %
Lymphs Abs: 2.6 10*3/uL (ref 0.7–4.0)
MCH: 23 pg — ABNORMAL LOW (ref 26.0–34.0)
MCHC: 30.1 g/dL (ref 30.0–36.0)
MCV: 76.2 fL — ABNORMAL LOW (ref 80.0–100.0)
Monocytes Absolute: 0.6 10*3/uL (ref 0.1–1.0)
Monocytes Relative: 9 %
Neutro Abs: 3.1 10*3/uL (ref 1.7–7.7)
Neutrophils Relative %: 47 %
Platelets: 195 10*3/uL (ref 150–400)
RBC: 5.88 MIL/uL — ABNORMAL HIGH (ref 4.22–5.81)
RDW: 18.7 % — ABNORMAL HIGH (ref 11.5–15.5)
WBC: 6.5 10*3/uL (ref 4.0–10.5)
nRBC: 0 % (ref 0.0–0.2)

## 2019-03-09 LAB — BASIC METABOLIC PANEL
Anion gap: 8 (ref 5–15)
BUN: 12 mg/dL (ref 6–20)
CO2: 24 mmol/L (ref 22–32)
Calcium: 9 mg/dL (ref 8.9–10.3)
Chloride: 108 mmol/L (ref 98–111)
Creatinine, Ser: 1.38 mg/dL — ABNORMAL HIGH (ref 0.61–1.24)
GFR calc Af Amer: >60 mL/min (ref >60)
GFR calc non Af Amer: >60 mL/min (ref >60)
Glucose, Bld: 113 mg/dL — ABNORMAL HIGH (ref 70–99)
Potassium: 3.9 mmol/L (ref 3.5–5.1)
Sodium: 140 mmol/L (ref 135–145)

## 2019-03-09 LAB — VITAMIN B12: Vitamin B-12: 626 pg/mL (ref 180–914)

## 2019-03-09 LAB — FERRITIN: Ferritin: 7 ng/mL — ABNORMAL LOW (ref 24–336)

## 2019-03-10 ENCOUNTER — Inpatient Hospital Stay (HOSPITAL_BASED_OUTPATIENT_CLINIC_OR_DEPARTMENT_OTHER): Payer: Self-pay | Admitting: Oncology

## 2019-03-10 ENCOUNTER — Inpatient Hospital Stay: Payer: Self-pay

## 2019-03-10 ENCOUNTER — Other Ambulatory Visit: Payer: Self-pay

## 2019-03-10 VITALS — BP 140/90 | HR 78 | Resp 20

## 2019-03-10 DIAGNOSIS — F17210 Nicotine dependence, cigarettes, uncomplicated: Secondary | ICD-10-CM

## 2019-03-10 DIAGNOSIS — D120 Benign neoplasm of cecum: Secondary | ICD-10-CM

## 2019-03-10 DIAGNOSIS — D509 Iron deficiency anemia, unspecified: Secondary | ICD-10-CM

## 2019-03-10 DIAGNOSIS — E538 Deficiency of other specified B group vitamins: Secondary | ICD-10-CM

## 2019-03-10 DIAGNOSIS — N289 Disorder of kidney and ureter, unspecified: Secondary | ICD-10-CM

## 2019-03-10 MED ORDER — IRON SUCROSE 20 MG/ML IV SOLN
200.0000 mg | Freq: Once | INTRAVENOUS | Status: AC
  Administered 2019-03-10: 200 mg via INTRAVENOUS
  Filled 2019-03-10: qty 10

## 2019-03-10 MED ORDER — CYANOCOBALAMIN 1000 MCG/ML IJ SOLN
1000.0000 ug | Freq: Once | INTRAMUSCULAR | Status: AC
  Administered 2019-03-10: 15:00:00 1000 ug via INTRAMUSCULAR
  Filled 2019-03-10: qty 1

## 2019-03-10 MED ORDER — SODIUM CHLORIDE 0.9 % IV SOLN
Freq: Once | INTRAVENOUS | Status: AC
  Administered 2019-03-10: 14:00:00 via INTRAVENOUS
  Filled 2019-03-10: qty 250

## 2019-03-12 NOTE — Progress Notes (Signed)
HEMATOLOGY-ONCOLOGY TeleHEALTH VISIT PROGRESS NOTE  I connected with Luke [name] on 03/12/19 at 10:15 AM EDT by video enabled telemedicine visit and verified that I am speaking with the correct person using two identifiers. I discussed the limitations, risks, security and privacy concerns of performing an evaluation and management service by telemedicine and the availability of in-person appointments. I also discussed with the patient that there may be a patient responsible charge related to this service. The patient expressed understanding and agreed to proceed.   Other persons participating in the visit and their role in the encounter:   Janeann Merl, RN, check in patient.   Patient's location: Home  Provider's location: Work Risk analyst Complaint: Follow-up for management of severe iron deficiency anemia.   INTERVAL HISTORY Luke [name] is a 38 y.o. male who has above history reviewed by me today presents for follow up visit for management of severe iron deficiency anemia Problems and complaints are listed below:  Patient previously follows up with Dr. Mike Gip and is switched his care to me on 03/10/2019. Extensive past medical records, images, pathology reports reviewed was performed.  07/28/2018 EGD reviewed esophageal mucosal changes suspicious for short segment Barrett's esophagus. Erythematous mucosa in the antrum, gastric polyps, small bowel biopsy was negative.  There was mild reactive gastropathy with minimal chronic inactive inflammation.  GE junction reviewed squamous: GE mucosa reviewed squamocolumnar mucosa with chronic inflammation. 07/28/2018 colonoscopy reviewed 20 cm length villous in obstructing large mass in the cecum.  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  There was no high-grade dysplasia or malignancy.  CEA was 2.8 on 08/04/2018 and a 2.7 on 08/12/2018. Patient underwent a right colectomy on 08/15/2018 which revealed a 4.8 cm pedunculated villous adenoma  in the cecum associated with [place] black tattoo.  There was a 1.2 cm pedunculated adenoma in the ascending colon.  There was an incidental hyperplastic polyp in the appendix.  There were 20 regional lymph nodes without pathological changes.  Patient has chronic B12 deficiency.  Intrinsic factor and antiparietal cell antibodies were negative.  Patient is taking oral B12 supplements.  Patient has chronic renal insufficiency.  Patient is taking oral iron supplementation for iron deficiency.  Reports being compliant with iron supplementation.   Reports feeling very fatigued and tired which is getting worse. Denies hematochezia, hematuria, hematemesis, epistaxis, black tarry stool or easy bruising.      Review of Systems  Constitutional: Positive for fatigue. Negative for appetite change, chills, fever and unexpected weight change.  HENT:   Negative for hearing loss and voice change.   Eyes: Negative for eye problems and icterus.  Respiratory: Negative for chest tightness, cough and shortness of breath.   Cardiovascular: Negative for chest pain and leg swelling.  Gastrointestinal: Negative for abdominal distention and abdominal pain.  Endocrine: Negative for hot flashes.  Genitourinary: Negative for difficulty urinating, dysuria and frequency.   Musculoskeletal: Negative for arthralgias.  Skin: Negative for itching and rash.  Neurological: Negative for light-headedness and numbness.  Hematological: Negative for adenopathy. Does not bruise/bleed easily.  Psychiatric/Behavioral: Negative for confusion.    Past Medical History:  Diagnosis Date  . Anemia   . Cancer ([place])    colon ca  . Cecum mass   . Colonic neoplasm   . Diverticulosis of large intestine without diverticulitis   . GERD (gastroesophageal reflux disease)   . Hypertension   . Pneumonia 09/07/2016  . Stomach irritation    Past Surgical History:  Procedure Laterality Date  .  COLONOSCOPY WITH PROPOFOL N/A 07/28/2018    Procedure: COLONOSCOPY WITH PROPOFOL;  Surgeon: Virgel Manifold, MD;  Location: [facility] ENDOSCOPY;  Service: Endoscopy;  Laterality: N/A;  . ESOPHAGOGASTRODUODENOSCOPY (EGD) WITH PROPOFOL N/A 07/28/2018   Procedure: ESOPHAGOGASTRODUODENOSCOPY (EGD) WITH PROPOFOL;  Surgeon: Virgel Manifold, MD;  Location: [facility] ENDOSCOPY;  Service: Endoscopy;  Laterality: N/A;  . none    . PARTIAL COLECTOMY Right 08/15/2018   Procedure: RIGHT  COLECTOMY;  Surgeon: Vickie Epley, MD;  Location: [facility] ORS;  Service: General;  Laterality: Right;    Family History  Problem Relation Age of Onset  . CAD Maternal Grandmother   . Breast cancer Maternal Grandmother   . Breast cancer Mother   . Hypertension Mother   . Hypertension Father   . Colon cancer Paternal Uncle     Social History   Socioeconomic History  . Marital status: Single    Spouse name: Not on file  . Number of children: Not on file  . Years of education: Not on file  . Highest education level: Not on file  Occupational History  . Not on file  Social Needs  . Financial resource strain: Not on file  . Food insecurity:    Worry: Not on file    Inability: Not on file  . Transportation needs:    Medical: Not on file    Non-medical: Not on file  Tobacco Use  . Smoking status: Current Every Day Smoker    Packs/day: 0.10  . Smokeless tobacco: Never Used  . Tobacco comment: patient is social smoker/ smokes when he drinks   Substance and Sexual Activity  . Alcohol use: Yes    Alcohol/week: 1.0 standard drinks    Types: 1 Cans of beer per week    Comment: patient reports he consumes ~2 beers about once per month  . Drug use: Not Currently    Types: Marijuana  . Sexual activity: Yes    Birth control/protection: Condom  Lifestyle  . Physical activity:    Days per week: Not on file    Minutes per session: Not on file  . Stress: Not on file  Relationships  . Social connections:    Talks on phone: Not on file    Gets together:  Not on file    Attends religious service: Not on file    Active member of club or organization: Not on file    Attends meetings of clubs or organizations: Not on file    Relationship status: Not on file  . Intimate partner violence:    Fear of current or ex partner: Not on file    Emotionally abused: Not on file    Physically abused: Not on file    Forced sexual activity: Not on file  Other Topics Concern  . Not on file  Social History Narrative  . Not on file    Current Outpatient Medications on File Prior to Visit  Medication Sig Dispense Refill  . amLODipine (NORVASC) 5 MG tablet Take 1 tablet (5 mg total) by mouth daily. 30 tablet 3  . bisacodyl (DULCOLAX) 5 MG EC tablet Take all 4 tablets at 8 am the day before your surgery 4 tablet 0  . Cyanocobalamin (VITAMIN B-12 PO) Take 1 tablet by mouth daily.    Marland Kitchen docusate sodium (COLACE) 100 MG capsule Take 1 capsule (100 mg total) by mouth 2 (two) times daily. 10 capsule 10  . IRON PO Take 1 tablet by mouth daily.  No current facility-administered medications on file prior to visit.     No Known Allergies     Observations/Objective: Today's Vitals   03/10/19 1016  PainSc: 0-No pain   There is no height or weight on file to calculate BMI.  Physical Exam  Constitutional: He is oriented to person, place, and time. No distress.  HENT:  Head: Normocephalic and atraumatic.  Pulmonary/Chest: Effort normal.  Neurological: He is alert and oriented to person, place, and time.  Psychiatric: Affect normal.    CBC    Component Value Date/Time   WBC 6.5 03/09/2019 1351   RBC 5.88 (H) 03/09/2019 1351   HGB 13.5 03/09/2019 1351   HGB 9.2 (L) 04/23/2013 2126   HCT 44.8 03/09/2019 1351   HCT 31.7 (L) 04/23/2013 2126   PLT 195 03/09/2019 1351   PLT 318 04/23/2013 2126   MCV 76.2 (L) 03/09/2019 1351   MCV 62 (L) 04/23/2013 2126   MCH 23.0 (L) 03/09/2019 1351   MCHC 30.1 03/09/2019 1351   RDW 18.7 (H) 03/09/2019 1351   RDW 18.8  (H) 04/23/2013 2126   LYMPHSABS 2.6 03/09/2019 1351   MONOABS 0.6 03/09/2019 1351   EOSABS 0.2 03/09/2019 1351   BASOSABS 0.1 03/09/2019 1351    CMP     Component Value Date/Time   NA 140 03/09/2019 1351   NA 138 04/23/2013 2126   K 3.9 03/09/2019 1351   K 3.6 04/23/2013 2126   CL 108 03/09/2019 1351   CL 103 04/23/2013 2126   CO2 24 03/09/2019 1351   CO2 26 04/23/2013 2126   GLUCOSE 113 (H) 03/09/2019 1351   GLUCOSE 188 (H) 04/23/2013 2126   BUN 12 03/09/2019 1351   BUN 15 04/23/2013 2126   CREATININE 1.38 (H) 03/09/2019 1351   CREATININE 1.78 (H) 04/23/2013 2126   CALCIUM 9.0 03/09/2019 1351   CALCIUM 9.2 04/23/2013 2126   PROT 8.5 (H) 09/29/2018 0915   PROT 7.9 04/23/2013 2126   ALBUMIN 4.3 09/29/2018 0915   ALBUMIN 3.8 04/23/2013 2126   AST 34 09/29/2018 0915   AST 13 (L) 04/23/2013 2126   ALT 24 09/29/2018 0915   ALT 20 04/23/2013 2126   ALKPHOS 83 09/29/2018 0915   ALKPHOS 69 04/23/2013 2126   BILITOT 0.5 09/29/2018 0915   BILITOT 0.4 04/23/2013 2126   GFRNONAA >60 03/09/2019 1351   GFRNONAA 49 (L) 04/23/2013 2126   GFRAA >60 03/09/2019 1351   GFRAA 57 (L) 04/23/2013 2126     RADIOGRAPHIC STUDIES: I have personally reviewed the radiological images as listed and agreed with the findings in the report. 08/08/2018 CT abdomen pelvis with contrast 7.5 mass extending from cecum into lower ascending colon.  No CT evidence of extension of the mass beyond the colon wall.  No evidence of metastasis disease.  Assessment and [name]: 1. Iron deficiency anemia, unspecified iron deficiency anemia type   2. B12 deficiency   3. Adenoma of cecum   4. Renal insufficiency   Labs are reviewed and discussed with patient.  #Iron deficiency anemia, on oral iron supplementation. Her hemoglobin is 13.5, however, Ferritin level persistently low at 7, consistent with severe iron deficiency. Patient also subjectively feels very fatigued and tired. [name] IV iron with Venofer 200mg   weekly x 4 doses. Allergy reactions/infusion reaction including anaphylactic reaction discussed with patient. Other side effects include but not limited to high blood pressure, skin rash, weight gain, leg swelling, etc. Patient voices understanding and willing to proceed.  #Vitamin B12 deficiency,  vitamin B12 level at 626.  Continue take oral vitamin B12 supplements. #Cecal mass status post right colectomy.  Villous adenoma.  No additional treatment needed. Follow up with Dr.Tahiliani for future colonoscopy.   #Chronic renal insufficiency Creatinine 1.38, at his baseline. Advise patient to continue follow-up with nephrology.   Follow Up Instructions: IV Venofer weekly x4. Lab MD possible Venofer in 9 weeks with labs done prior.   I discussed the assessment and treatment [name] with the patient. The patient was provided an opportunity to ask questions and all were answered. The patient agreed with the [name] and demonstrated an understanding of the instructions.  The patient was advised to call back or seek an in-person evaluation if the symptoms worsen or if the condition fails to improve as anticipated.   Earlie Server, MD 03/12/2019 10:45 AM

## 2019-03-13 ENCOUNTER — Encounter: Payer: Self-pay | Admitting: Pharmacy Technician

## 2019-03-13 NOTE — Progress Notes (Signed)
Patient has been approved for drug assistance by [employer] for Venofer. The enrollment period is from 02/11/19-03/11/20 based on self pay. First DOS covered is 03/10/19.

## 2019-03-14 ENCOUNTER — Encounter: Payer: Self-pay | Admitting: Oncology

## 2019-03-17 ENCOUNTER — Other Ambulatory Visit: Payer: Self-pay

## 2019-03-17 ENCOUNTER — Inpatient Hospital Stay: Payer: Self-pay

## 2019-03-17 VITALS — BP 157/103 | HR 78 | Temp 97.6°F | Resp 18

## 2019-03-17 DIAGNOSIS — D509 Iron deficiency anemia, unspecified: Secondary | ICD-10-CM

## 2019-03-17 MED ORDER — IRON SUCROSE 20 MG/ML IV SOLN
200.0000 mg | INTRAVENOUS | Status: DC
  Administered 2019-03-17: 14:00:00 200 mg via INTRAVENOUS
  Filled 2019-03-17: qty 10

## 2019-03-17 MED ORDER — SODIUM CHLORIDE 0.9 % IV SOLN
Freq: Once | INTRAVENOUS | Status: AC
  Administered 2019-03-17: 14:00:00 via INTRAVENOUS
  Filled 2019-03-17: qty 250

## 2019-03-17 NOTE — Progress Notes (Signed)
Pt tolerated infusion well. Pt stable at discharge. 

## 2019-03-24 ENCOUNTER — Other Ambulatory Visit: Payer: Self-pay

## 2019-03-24 ENCOUNTER — Inpatient Hospital Stay: Payer: Self-pay

## 2019-03-24 VITALS — BP 132/88 | HR 74 | Temp 96.0°F | Resp 18

## 2019-03-24 DIAGNOSIS — D509 Iron deficiency anemia, unspecified: Secondary | ICD-10-CM

## 2019-03-24 MED ORDER — IRON SUCROSE 20 MG/ML IV SOLN
200.0000 mg | INTRAVENOUS | Status: DC
  Administered 2019-03-24: 14:00:00 200 mg via INTRAVENOUS
  Filled 2019-03-24: qty 10

## 2019-03-24 MED ORDER — SODIUM CHLORIDE 0.9 % IV SOLN
Freq: Once | INTRAVENOUS | Status: AC
  Administered 2019-03-24: 14:00:00 via INTRAVENOUS
  Filled 2019-03-24: qty 250

## 2019-03-24 NOTE — Progress Notes (Signed)
Pt tolerated treatment well. Pt and VS stable at discharge.  

## 2019-03-31 ENCOUNTER — Inpatient Hospital Stay: Payer: Self-pay

## 2019-04-14 ENCOUNTER — Other Ambulatory Visit: Payer: Self-pay

## 2019-04-14 ENCOUNTER — Encounter: Payer: Self-pay | Admitting: Emergency Medicine

## 2019-04-14 ENCOUNTER — Emergency Department
Admission: EM | Admit: 2019-04-14 | Discharge: 2019-04-14 | Disposition: A | Discharge status: Home / Self Care | Payer: HRSA Program | Attending: Emergency Medicine | Admitting: Emergency Medicine

## 2019-04-14 DIAGNOSIS — Z85038 Personal history of other malignant neoplasm of large intestine: Secondary | ICD-10-CM | POA: Insufficient documentation

## 2019-04-14 DIAGNOSIS — I10 Essential (primary) hypertension: Secondary | ICD-10-CM | POA: Insufficient documentation

## 2019-04-14 DIAGNOSIS — Z7189 Other specified counseling: Secondary | ICD-10-CM

## 2019-04-14 DIAGNOSIS — Z79899 Other long term (current) drug therapy: Secondary | ICD-10-CM | POA: Diagnosis not present

## 2019-04-14 DIAGNOSIS — F17210 Nicotine dependence, cigarettes, uncomplicated: Secondary | ICD-10-CM | POA: Diagnosis not present

## 2019-04-14 DIAGNOSIS — Z20828 Contact with and (suspected) exposure to other viral communicable diseases: Secondary | ICD-10-CM | POA: Insufficient documentation

## 2019-04-14 DIAGNOSIS — Z20822 Contact with and (suspected) exposure to covid-19: Secondary | ICD-10-CM

## 2019-04-14 NOTE — Discharge Instructions (Addendum)
Follow up with your regular doctor.  Your test will be back in 2 days.  Stay quarantined until you know the results.  Wear an N95 mask when out in public due to your health concerns.

## 2019-04-14 NOTE — ED Provider Notes (Signed)
[facility] Emergency Department Provider Note  ____________________________________________   None    (approximate)  I have reviewed the triage vital signs and the nursing notes.   HISTORY  Chief Complaint Covid-19 Testing    HPI Kahil KIJUAN GALLICCHIO is a 38 y.o. male presents emergency department requesting COVID-19 testing.  States his father tested positive yesterday.  Patient was at his father's house daily.  He states he himself has to have blood transfusions weekly and recently had a colon cancer which is in remission at this time.  He is concerned due to the exposure.  He denies cough, congestion, chest pain or shortness of breath.  Denies abdominal pain.    Past Medical History:  Diagnosis Date  . Anemia   . Cancer ([place])    colon ca  . Cecum mass   . Colonic neoplasm   . Diverticulosis of large intestine without diverticulitis   . GERD (gastroesophageal reflux disease)   . Hypertension   . Pneumonia 09/07/2016  . Stomach irritation     Patient Active Problem List   Diagnosis Date Noted  . Hypertension 08/17/2018  . Columnar-lined esophagus   . Gastric polyp   . Iron deficiency anemia 07/23/2018  . B12 deficiency 07/23/2018    Past Surgical History:  Procedure Laterality Date  . COLONOSCOPY WITH PROPOFOL N/A 07/28/2018   Procedure: COLONOSCOPY WITH PROPOFOL;  Surgeon: Virgel Manifold, MD;  Location: [facility] ENDOSCOPY;  Service: Endoscopy;  Laterality: N/A;  . ESOPHAGOGASTRODUODENOSCOPY (EGD) WITH PROPOFOL N/A 07/28/2018   Procedure: ESOPHAGOGASTRODUODENOSCOPY (EGD) WITH PROPOFOL;  Surgeon: Virgel Manifold, MD;  Location: [facility] ENDOSCOPY;  Service: Endoscopy;  Laterality: N/A;  . none    . PARTIAL COLECTOMY Right 08/15/2018   Procedure: RIGHT  COLECTOMY;  Surgeon: Vickie Epley, MD;  Location: [facility] ORS;  Service: General;  Laterality: Right;    Prior to Admission medications   Medication Sig Start Date End Date Taking?  Authorizing Provider  amLODipine (NORVASC) 5 MG tablet Take 1 tablet (5 mg total) by mouth daily. 08/19/18   Tylene Fantasia, PA-C  bisacodyl (DULCOLAX) 5 MG EC tablet Take all 4 tablets at 8 am the day before your surgery 08/09/18   Vickie Epley, MD  Cyanocobalamin (VITAMIN B-12 PO) Take 1 tablet by mouth daily.    [provider]  docusate sodium (COLACE) 100 MG capsule Take 1 capsule (100 mg total) by mouth 2 (two) times daily. 09/13/18   Azzie Glatter, FNP  IRON PO Take 1 tablet by mouth daily.    [provider]    Allergies Patient has no known allergies.  Family History  Problem Relation Age of Onset  . CAD Maternal Grandmother   . Breast cancer Maternal Grandmother   . Breast cancer Mother   . Hypertension Mother   . Hypertension Father   . Colon cancer Paternal Uncle     Social History Social History   Tobacco Use  . Smoking status: Current Every Day Smoker    Packs/day: 0.10  . Smokeless tobacco: Never Used  . Tobacco comment: patient is social smoker/ smokes when he drinks   Substance Use Topics  . Alcohol use: Yes    Alcohol/week: 1.0 standard drinks    Types: 1 Cans of beer per week    Comment: patient reports he consumes ~2 beers about once per month  . Drug use: Not Currently    Types: Marijuana    Review of Systems  Constitutional:  No fever/chills, positive COVID-19 exposure Eyes: No visual changes. ENT: No sore throat. Respiratory: Denies cough Genitourinary: Negative for dysuria. Musculoskeletal: Negative for back pain. Skin: Negative for rash.    ____________________________________________   PHYSICAL EXAM:  VITAL SIGNS: ED Triage Vitals  Enc Vitals Group     BP 04/14/19 1424 (!) 156/108     Pulse Rate 04/14/19 1424 85     Resp 04/14/19 1424 18     Temp 04/14/19 1424 98.8 F (37.1 C)     Temp Source 04/14/19 1424 Oral     SpO2 04/14/19 1424 96 %     Weight 04/14/19 1425 200 lb (90.7 kg)     Height  04/14/19 1425 5\' 8"  (1.727 m)     Head Circumference --      Peak Flow --      Pain Score 04/14/19 1425 0     Pain Loc --      Pain Edu? --      Excl. in [place]? --     Constitutional: Alert and oriented. Well appearing and in no acute distress. Eyes: Conjunctivae are normal.  Head: Atraumatic. Nose: No congestion/rhinnorhea. Mouth/Throat: Mucous membranes are moist.   Neck:  supple no lymphadenopathy noted Cardiovascular: Normal rate, regular rhythm. Heart sounds are normal Respiratory: Normal respiratory effort.  No retractions, lungs c t a  GU: deferred Musculoskeletal: FROM all extremities, warm and well perfused Neurologic:  Normal speech and language.  Skin:  Skin is warm, dry and intact. No rash noted. Psychiatric: Mood and affect are normal. Speech and behavior are normal.  ____________________________________________   LABS (all labs ordered are listed, but only abnormal results are displayed)  Labs Reviewed  NOVEL CORONAVIRUS, NAA (HOSPITAL ORDER, SEND-OUT TO REF LAB)   ____________________________________________   ____________________________________________  RADIOLOGY    ____________________________________________   PROCEDURES  Procedure(s) performed: No  Procedures    ____________________________________________   INITIAL IMPRESSION / ASSESSMENT AND PLAN / ED COURSE  Pertinent labs & imaging results that were available during my care of the patient were reviewed by me and considered in my medical decision making (see chart for details).   Patient is a 38 year old male presents emergency department requesting COVID-19 testing as his father recently tested positive.  He states he sees his father daily.  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   Exam patient appears well.  COVID-19 testing performed by our nursing staff.  Explained to the patient testing will not be back for  2 days.  He is to quarantine himself at home until results are called to him.  If they are positive he should quarantine and the health department will discuss how long with him.  He would also need to notify his regular doctor.  He states he understands will comply.  He was discharged in stable condition.     As part of my medical decision making, I reviewed the following data within the [facility] notes reviewed and incorporated, Old chart reviewed, Notes from prior ED visits and [place] Controlled Substance Database  ____________________________________________   FINAL CLINICAL IMPRESSION(S) / ED DIAGNOSES  Final diagnoses:  Educated About Covid-19 Virus Infection  Close Exposure to Covid-19 Virus      NEW MEDICATIONS STARTED DURING THIS VISIT:  New Prescriptions   No medications on file     Note:  This document was prepared using Dragon voice recognition software and may include  unintentional dictation errors.    Versie Starks, PA-C 04/14/19 1528    Earleen Newport, MD 04/23/19 1500

## 2019-04-14 NOTE — ED Triage Notes (Signed)
Patient states his dad was tested for Covid-19 yesterday and was positive.  Patient states he does not have any symptoms but has been around his dad and patient in remission for colon cancer.  Patient is in no obvious distress at this time.

## 2019-04-15 LAB — NOVEL CORONAVIRUS, NAA (HOSP ORDER, SEND-OUT TO REF LAB; TAT 18-24 HRS): SARS-CoV-2, NAA: NOT DETECTED

## 2019-04-17 ENCOUNTER — Telehealth: Payer: Self-pay | Admitting: Emergency Medicine

## 2019-04-17 NOTE — Telephone Encounter (Signed)
Patient called for covid 19 result.  I gave him negative result.

## 2019-04-25 ENCOUNTER — Ambulatory Visit: Payer: Self-pay | Admitting: Surgery

## 2019-04-25 ENCOUNTER — Other Ambulatory Visit: Payer: Self-pay

## 2019-04-26 ENCOUNTER — Inpatient Hospital Stay: Payer: Self-pay | Attending: Oncology

## 2019-04-26 ENCOUNTER — Other Ambulatory Visit: Payer: Self-pay

## 2019-04-26 VITALS — BP 154/98 | HR 69 | Resp 19

## 2019-04-26 DIAGNOSIS — D509 Iron deficiency anemia, unspecified: Secondary | ICD-10-CM | POA: Insufficient documentation

## 2019-04-26 MED ORDER — IRON SUCROSE 20 MG/ML IV SOLN
200.0000 mg | INTRAVENOUS | Status: DC
  Administered 2019-04-26: 200 mg via INTRAVENOUS
  Filled 2019-04-26: qty 10

## 2019-04-26 MED ORDER — SODIUM CHLORIDE 0.9 % IV SOLN
Freq: Once | INTRAVENOUS | Status: AC
  Administered 2019-04-26: 14:00:00 via INTRAVENOUS
  Filled 2019-04-26: qty 250

## 2019-04-27 ENCOUNTER — Encounter: Payer: Self-pay | Admitting: Surgery

## 2019-04-27 ENCOUNTER — Other Ambulatory Visit: Payer: Self-pay

## 2019-04-27 ENCOUNTER — Ambulatory Visit (INDEPENDENT_AMBULATORY_CARE_PROVIDER_SITE_OTHER): Payer: Self-pay | Admitting: Surgery

## 2019-04-27 DIAGNOSIS — K4090 Unilateral inguinal hernia, without obstruction or gangrene, not specified as recurrent: Secondary | ICD-10-CM

## 2019-04-27 NOTE — Progress Notes (Signed)
Surgical Clinic Progress/Follow-up Note   HPI:  38 y.o. Male presents to clinic for follow-up evaluation of his previously persistently symptomatic (painful) reducible Right inguinal hernia for which he initially presented, but during appropriate workup was diagnosed with colon cancer for which he underwent Right colectomy. Subsequent inguinal hernia repair was then deferred for "kidney problems". Patient reports his Right groin sometimes still hurts "like being kicked in the testicles" with minimal occasional soreness at his inferior-most aspect of prior lower midline laparotomy incisional scar. He's more recently been experiencing symptomatic anemia with melena for which he says he is scheduled to undergo upcoming EGD. He adds that the hernia otherwise does not bother him much and he'd like to address his symptomatic anemia prior to considering repair of his hernia. He currently denies any fever/chills, N/V, CP, or SOB.  Review of Systems:  Constitutional: denies any other weight loss, fever, chills, or sweats  Eyes: denies any other vision changes, history of eye injury  ENT: denies sore throat, hearing problems  Respiratory: denies shortness of breath, wheezing  Cardiovascular: denies chest pain, palpitations  Gastrointestinal: abdominal pain, N/V, and bowel function as per HPI Musculoskeletal: denies any other joint pains or cramps  Skin: Denies any other rashes or skin discolorations  Neurological: denies any other headache, dizziness, weakness  Psychiatric: denies any other depression, anxiety  All other review of systems: otherwise negative   Vital Signs:  BP 130/90   Pulse 89   Temp 97.9 F (36.6 C)   Resp 15   Ht 5\' 8"  (1.727 m)   Wt 202 lb (91.6 kg)   SpO2 98%   BMI 30.71 kg/m    Physical Exam:  Constitutional:  -- Normal body habitus  -- Awake, alert, and oriented x3  Eyes:  -- Pupils equally round and reactive to light  -- No scleral icterus  Ear, nose, throat:   -- No jugular venous distension  -- No nasal drainage, bleeding Pulmonary:  -- No crackles -- Equal breath sounds bilaterally -- Breathing non-labored at rest Cardiovascular:  -- S1, S2 present  -- No pericardial rubs  Gastrointestinal:  -- Soft, nontender, non-distended, no guarding/rebound  -- Moderately tender to palpation easily reducible stable-sized Right inguinal hernia -- No other abdominal masses appreciated, pulsatile or otherwise  Musculoskeletal / Integumentary:  -- Wounds or skin discoloration: None appreciated  -- Extremities: B/L UE and LE FROM, hands and feet warm, no edema  Neurologic:  -- Motor function: intact and symmetric  -- Sensation: intact and symmetric   Laboratory studies:  CBC Latest Ref Rng & Units 03/09/2019 11/10/2018 09/29/2018  WBC 4.0 - 10.5 K/uL 6.5 5.2 -  Hemoglobin 13.0 - 17.0 g/dL 13.5 11.7(L) 13.9  Hematocrit 39.0 - 52.0 % 44.8 42.0 41.0  Platelets 150 - 400 K/uL 195 277 -   CMP Latest Ref Rng & Units 03/09/2019 11/10/2018 09/29/2018  Glucose 70 - 99 mg/dL 113(H) 98 77  BUN 6 - 20 mg/dL 12 11 13   Creatinine 0.61 - 1.24 mg/dL 1.38(H) 1.37(H) 1.30(H)  Sodium 135 - 145 mmol/L 140 140 141  Potassium 3.5 - 5.1 mmol/L 3.9 4.0 4.5  Chloride 98 - 111 mmol/L 108 106 110  CO2 22 - 32 mmol/L 24 27 -  Calcium 8.9 - 10.3 mg/dL 9.0 9.1 -  Total Protein 6.5 - 8.1 g/dL - - -  Total Bilirubin 0.3 - 1.2 mg/dL - - -  Alkaline Phos 38 - 126 U/L - - -  AST 15 - 41  U/L - - -  ALT 0 - 44 U/L - - -   Imaging: No new pertinent imaging studies available at this time   Assessment:  38 y.o. yo Male with a problem list including...  Patient Active Problem List   Diagnosis Date Noted  . Hypertension 08/17/2018  . Columnar-lined esophagus   . Gastric polyp   . Iron deficiency anemia 07/23/2018  . 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               - discussed with patient signs and symptoms of hernia incarceration and obstruction             - strategies for manual self-reduction of patient's hernia also reviewed and discussed  - maintain hydration with high fiber heart healthy diet to reduce/minimize constipation +/- daily stool softener as needed  - return to clinic when patient wishes/is able to have Right inguinal hernia repaired  - instructed to call if any questions or concerns   All of the above recommendations were discussed with the patient, and all of patient's questions were answered to his expressed satisfaction.  -- Marilynne Drivers Rosana Hoes, MD, [place]: [facility] - Partnering for exceptional care. Office: [phone]

## 2019-04-27 NOTE — Patient Instructions (Signed)
Please call our office when you are ready to move forward with surgery.   Inguinal Hernia, Adult An inguinal hernia is when fat or your intestines push through a weak spot in a muscle where your leg meets your lower belly (groin). This causes a rounded lump (bulge). This kind of hernia could also be:  In your scrotum, if you are male.  In folds of skin around your vagina, if you are male. There are three types of inguinal hernias. These include:  Hernias that can be pushed back into the belly (are reducible). This type rarely causes pain.  Hernias that cannot be pushed back into the belly (are incarcerated).  Hernias that cannot be pushed back into the belly and lose their blood supply (are strangulated). This type needs emergency surgery. If you do not have symptoms, you may not need treatment. If you have symptoms or a large hernia, you may need surgery. Follow these instructions at home: Lifestyle  Do these things if told by your doctor so you do not have trouble pooping (constipation): ? Drink enough fluid to keep your pee (urine) pale yellow. ? Eat foods that have a lot of fiber. These include fresh fruits and vegetables, whole grains, and beans. ? Limit foods that are high in fat and processed sugars. These include foods that are fried or sweet. ? Take medicine for trouble pooping.  Avoid lifting heavy objects.  Avoid standing for long amounts of time.  Do not use any products that contain nicotine or tobacco. These include cigarettes and e-cigarettes. If you need help quitting, ask your doctor.  Stay at a healthy weight. General instructions  You may try to push your hernia in by very gently pressing on it when you are lying down. Do not try to force the bulge back in if it will not push in easily.  Watch your hernia for any changes in shape, size, or color. Tell your doctor if you see any changes.  Take over-the-counter and prescription medicines only as told by your  doctor.  Keep all follow-up visits as told by your doctor. This is important. Contact a doctor if:  You have a fever.  You have new symptoms.  Your symptoms get worse. Get help right away if:  The area where your leg meets your lower belly has: ? Pain that gets worse suddenly. ? A bulge that gets bigger suddenly, and it does not get smaller after that. ? A bulge that turns red or purple. ? A bulge that is painful when you touch it.  You are a man, and your scrotum: ? Suddenly feels painful. ? Suddenly changes in size.  You cannot push the hernia in by very gently pressing on it when you are lying down. Do not try to force the bulge back in if it will not push in easily.  You feel sick to your stomach (nauseous), and that feeling does not go away.  You throw up (vomit), and that keeps happening.  You have a fast heartbeat.  You cannot poop (have a bowel movement) or pass gas. These symptoms may be an emergency. Do not wait to see if the symptoms will go away. Get medical help right away. Call your local emergency services (911 in the [place] Summary  An inguinal hernia is when fat or your intestines push through a weak spot in a muscle where your leg meets your lower belly (groin). This causes a rounded lump (bulge).  If you do not have  symptoms, you may not need treatment. If you have symptoms or a large hernia, you may need surgery.  Avoid lifting heavy objects. Also avoid standing for long amounts of time.  Do not try to force the bulge back in if it will not push in easily. This information is not intended to replace advice given to you by your health care provider. Make sure you discuss any questions you have with your health care provider. Document Released: 11/19/2006 Document Revised: 11/20/2017 Document Reviewed: 07/21/2017 Elsevier Interactive Patient Education  2019 [employer].

## 2019-05-01 ENCOUNTER — Encounter: Payer: Self-pay | Admitting: Surgery

## 2019-05-01 DIAGNOSIS — K4090 Unilateral inguinal hernia, without obstruction or gangrene, not specified as recurrent: Secondary | ICD-10-CM | POA: Insufficient documentation

## 2019-05-03 ENCOUNTER — Encounter: Payer: Self-pay | Admitting: Surgery

## 2019-05-08 ENCOUNTER — Telehealth: Payer: Self-pay | Admitting: *Deleted

## 2019-05-08 NOTE — Telephone Encounter (Signed)
Patient called stating he mised a call from [place] and thinks it is about lab results

## 2019-05-08 NOTE — Telephone Encounter (Signed)
It was an appointment reminder. Patient called and notified.

## 2019-05-11 ENCOUNTER — Other Ambulatory Visit: Payer: Self-pay

## 2019-05-11 ENCOUNTER — Inpatient Hospital Stay: Payer: Self-pay | Attending: Oncology

## 2019-05-11 DIAGNOSIS — D509 Iron deficiency anemia, unspecified: Secondary | ICD-10-CM | POA: Insufficient documentation

## 2019-05-11 DIAGNOSIS — N289 Disorder of kidney and ureter, unspecified: Secondary | ICD-10-CM | POA: Insufficient documentation

## 2019-05-11 DIAGNOSIS — E538 Deficiency of other specified B group vitamins: Secondary | ICD-10-CM | POA: Insufficient documentation

## 2019-05-11 LAB — CBC WITH DIFFERENTIAL/PLATELET
Abs Immature Granulocytes: 0.01 10*3/uL (ref 0.00–0.07)
Basophils Absolute: 0.1 10*3/uL (ref 0.0–0.1)
Basophils Relative: 1 %
Eosinophils Absolute: 0.2 10*3/uL (ref 0.0–0.5)
Eosinophils Relative: 3 %
HCT: 47.2 % (ref 39.0–52.0)
Hemoglobin: 15.4 g/dL (ref 13.0–17.0)
Immature Granulocytes: 0 %
Lymphocytes Relative: 48 %
Lymphs Abs: 2.9 10*3/uL (ref 0.7–4.0)
MCH: 26.7 pg (ref 26.0–34.0)
MCHC: 32.6 g/dL (ref 30.0–36.0)
MCV: 81.8 fL (ref 80.0–100.0)
Monocytes Absolute: 0.5 10*3/uL (ref 0.1–1.0)
Monocytes Relative: 8 %
Neutro Abs: 2.4 10*3/uL (ref 1.7–7.7)
Neutrophils Relative %: 40 %
Platelets: 168 10*3/uL (ref 150–400)
RBC: 5.77 MIL/uL (ref 4.22–5.81)
RDW: 20 % — ABNORMAL HIGH (ref 11.5–15.5)
WBC: 5.9 10*3/uL (ref 4.0–10.5)
nRBC: 0 % (ref 0.0–0.2)

## 2019-05-11 LAB — FERRITIN: Ferritin: 112 ng/mL (ref 24–336)

## 2019-05-11 LAB — COMPREHENSIVE METABOLIC PANEL
ALT: 39 U/L (ref 0–44)
AST: 34 U/L (ref 15–41)
Albumin: 3.8 g/dL (ref 3.5–5.0)
Alkaline Phosphatase: 56 U/L (ref 38–126)
Anion gap: 11 (ref 5–15)
BUN: 9 mg/dL (ref 6–20)
CO2: 22 mmol/L (ref 22–32)
Calcium: 8.9 mg/dL (ref 8.9–10.3)
Chloride: 106 mmol/L (ref 98–111)
Creatinine, Ser: 1.17 mg/dL (ref 0.61–1.24)
GFR calc Af Amer: >60 mL/min (ref >60)
GFR calc non Af Amer: >60 mL/min (ref >60)
Glucose, Bld: 106 mg/dL — ABNORMAL HIGH (ref 70–99)
Potassium: 3.8 mmol/L (ref 3.5–5.1)
Sodium: 139 mmol/L (ref 135–145)
Total Bilirubin: 0.6 mg/dL (ref 0.3–1.2)
Total Protein: 7 g/dL (ref 6.5–8.1)

## 2019-05-11 LAB — IRON AND TIBC
Iron: 150 ug/dL (ref 45–182)
Saturation Ratios: 46 % — ABNORMAL HIGH (ref 17.9–39.5)
TIBC: 324 ug/dL (ref 250–450)
UIBC: 174 ug/dL

## 2019-05-12 ENCOUNTER — Inpatient Hospital Stay: Payer: Self-pay | Admitting: Oncology

## 2019-05-12 ENCOUNTER — Inpatient Hospital Stay: Payer: Self-pay

## 2019-05-17 ENCOUNTER — Other Ambulatory Visit: Payer: Self-pay

## 2019-05-17 ENCOUNTER — Encounter: Payer: Self-pay | Admitting: Oncology

## 2019-05-17 ENCOUNTER — Inpatient Hospital Stay (HOSPITAL_BASED_OUTPATIENT_CLINIC_OR_DEPARTMENT_OTHER): Payer: Self-pay | Admitting: Oncology

## 2019-05-17 ENCOUNTER — Inpatient Hospital Stay: Payer: Self-pay

## 2019-05-17 VITALS — BP 146/104 | HR 90 | Temp 98.2°F | Resp 18 | Ht 68.0 in | Wt 203.5 lb

## 2019-05-17 DIAGNOSIS — N289 Disorder of kidney and ureter, unspecified: Secondary | ICD-10-CM

## 2019-05-17 DIAGNOSIS — D509 Iron deficiency anemia, unspecified: Secondary | ICD-10-CM

## 2019-05-17 DIAGNOSIS — E538 Deficiency of other specified B group vitamins: Secondary | ICD-10-CM

## 2019-05-17 LAB — URINALYSIS, DIPSTICK ONLY
Bilirubin Urine: NEGATIVE
Glucose, UA: NEGATIVE mg/dL
Hgb urine dipstick: NEGATIVE
Ketones, ur: NEGATIVE mg/dL
Leukocytes,Ua: NEGATIVE
Nitrite: NEGATIVE
Protein, ur: NEGATIVE mg/dL
Specific Gravity, Urine: 1.02 (ref 1.005–1.030)
pH: 5 (ref 5.0–8.0)

## 2019-05-17 NOTE — Progress Notes (Signed)
Pt states he is severely fatigued, could sleep all day if he could. , feels sleepy. Feels like his strength is low.

## 2019-05-17 NOTE — Progress Notes (Signed)
Hematology/Oncology Follow Up Note [facility]  Telephone:([phone] Fax:([phone]) [phone]  Patient Care Team: Glendon Axe, MD as PCP - General (Internal Medicine) Vickie Epley, MD as Consulting Physician (General Surgery) Virgel Manifold, MD as Consulting Physician (Gastroenterology)   Name of the patient: Luke [name]  [id number]  1981/06/24   REASON FOR VISIT  follow-up of anemia.  PERTINENT ONCOLOGY HISTORY Luke [name] is a 38 y.o. male who has above history reviewed by me today presents for follow up visit for management of severe iron deficiency anemia Problems and complaints are listed below:  Patient previously follows up with Dr. Mike Gip and is switched his care to me on 03/10/2019. Extensive past medical records, images, pathology reports reviewed was performed.  07/28/2018 EGD reviewed esophageal mucosal changes suspicious for short segment Barrett's esophagus. Erythematous mucosa in the antrum, gastric polyps, small bowel biopsy was negative.  There was mild reactive gastropathy with minimal chronic inactive inflammation.  GE junction reviewed squamous: GE mucosa reviewed squamocolumnar mucosa with chronic inflammation. 07/28/2018 colonoscopy reviewed 20 cm length villous in obstructing large mass in the cecum.  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  There was no high-grade dysplasia or malignancy.  CEA was 2.8 on 08/04/2018 and a 2.7 on 08/12/2018. Patient underwent a right colectomy on 08/15/2018 which revealed a 4.8 cm pedunculated villous adenoma in the cecum associated with [place] black tattoo.  There was a 1.2 cm pedunculated adenoma in the ascending colon.  There was an incidental hyperplastic polyp in the appendix.  There were 20 regional lymph nodes without pathological changes.  Patient has chronic B12 deficiency.  Intrinsic factor and antiparietal cell antibodies were negative.  Patient is taking oral B12  supplements.  Patient has chronic renal insufficiency.  Patient is taking oral iron supplementation for iron deficiency.  Reports being compliant with iron supplementation.   Reports feeling very fatigued and tired which is getting worse. Denies hematochezia, hematuria, hematemesis, epistaxis, black tarry stool or easy bruising.    INTERVAL HISTORY 38 y.o. male with history of iron deficiency anemia, B12 deficiency, renal insufficiency, cecum villous adenoma presents for follow-up. Status post IV Venofer 200 mg weekly x4. Patient tolerated well.  Takes oral vitamin B12 supplementation. Denies any fatigue, shortness of breath, chest pain, abdominal pain, blood in the stool..   Review of Systems  Constitutional: Negative for appetite change, chills, fatigue, fever and unexpected weight change.  HENT:   Negative for hearing loss and voice change.   Eyes: Negative for eye problems and icterus.  Respiratory: Negative for chest tightness, cough and shortness of breath.   Cardiovascular: Negative for chest pain and leg swelling.  Gastrointestinal: Negative for abdominal distention and abdominal pain.  Endocrine: Negative for hot flashes.  Genitourinary: Negative for difficulty urinating, dysuria and frequency.   Musculoskeletal: Negative for arthralgias.  Skin: Negative for itching and rash.  Neurological: Negative for light-headedness and numbness.  Hematological: Negative for adenopathy. Does not bruise/bleed easily.  Psychiatric/Behavioral: Negative for confusion.      No Known Allergies   Past Medical History:  Diagnosis Date  . Anemia   . Cancer ([place])    colon ca  . Cecum mass   . Colonic neoplasm   . Diverticulosis of large intestine without diverticulitis   . GERD (gastroesophageal reflux disease)   . Hypertension   . Pneumonia 09/07/2016  . Stomach irritation      Past Surgical History:  Procedure Laterality Date  . COLONOSCOPY WITH  PROPOFOL N/A 07/28/2018    Procedure: COLONOSCOPY WITH PROPOFOL;  Surgeon: Virgel Manifold, MD;  Location: [facility] ENDOSCOPY;  Service: Endoscopy;  Laterality: N/A;  . ESOPHAGOGASTRODUODENOSCOPY (EGD) WITH PROPOFOL N/A 07/28/2018   Procedure: ESOPHAGOGASTRODUODENOSCOPY (EGD) WITH PROPOFOL;  Surgeon: Virgel Manifold, MD;  Location: [facility] ENDOSCOPY;  Service: Endoscopy;  Laterality: N/A;  . none    . PARTIAL COLECTOMY Right 08/15/2018   Procedure: RIGHT  COLECTOMY;  Surgeon: Vickie Epley, MD;  Location: [facility] ORS;  Service: General;  Laterality: Right;    Social History   Socioeconomic History  . Marital status: Single    Spouse name: Not on file  . Number of children: Not on file  . Years of education: Not on file  . Highest education level: Not on file  Occupational History  . Not on file  Social Needs  . Financial resource strain: Not on file  . Food insecurity    Worry: Not on file    Inability: Not on file  . Transportation needs    Medical: Not on file    Non-medical: Not on file  Tobacco Use  . Smoking status: Current Some Day Smoker    Packs/day: 0.10  . Smokeless tobacco: Never Used  . Tobacco comment: patient is social smoker/ smokes when he drinks   Substance and Sexual Activity  . Alcohol use: Yes    Alcohol/week: 1.0 standard drinks    Types: 1 Cans of beer per week    Comment: patient reports he consumes ~2 beers about once per month  . Drug use: Not Currently    Types: Marijuana  . Sexual activity: Yes    Birth control/protection: Condom  Lifestyle  . Physical activity    Days per week: Not on file    Minutes per session: Not on file  . Stress: Not on file  Relationships  . Social Herbalist on phone: Not on file    Gets together: Not on file    Attends religious service: Not on file    Active member of club or organization: Not on file    Attends meetings of clubs or organizations: Not on file    Relationship status: Not on file  . Intimate partner violence     Fear of current or ex partner: Not on file    Emotionally abused: Not on file    Physically abused: Not on file    Forced sexual activity: Not on file  Other Topics Concern  . Not on file  Social History Narrative  . Not on file    Family History  Problem Relation Age of Onset  . CAD Maternal Grandmother   . Breast cancer Maternal Grandmother   . Breast cancer Mother   . Hypertension Mother   . Hypertension Father   . Colon cancer Paternal Uncle      Current Outpatient Medications:  .  amLODipine (NORVASC) 5 MG tablet, Take 1 tablet (5 mg total) by mouth daily., Disp: 30 tablet, Rfl: 3 .  Cyanocobalamin (VITAMIN B-12 PO), Take 1 tablet by mouth daily., Disp: , Rfl:  .  IRON PO, Take 1 tablet by mouth daily., Disp: , Rfl:   Physical exam:  Vitals:   05/17/19 1308  BP: (!) 146/104  Pulse: 90  Resp: 18  Temp: 98.2 F (36.8 C)  TempSrc: Tympanic  Weight: 203 lb 8 oz (92.3 kg)  Height: 5\' 8"  (1.727 m)   Physical Exam Constitutional:  General: He is not in acute distress. HENT:     Head: Normocephalic and atraumatic.  Eyes:     General: No scleral icterus.    Pupils: Pupils are equal, round, and reactive to light.  Neck:     Musculoskeletal: Normal range of motion and neck supple.  Cardiovascular:     Rate and Rhythm: Normal rate and regular rhythm.     Heart sounds: Normal heart sounds.  Pulmonary:     Effort: Pulmonary effort is normal. No respiratory distress.     Breath sounds: No wheezing.  Abdominal:     General: Bowel sounds are normal. There is no distension.     Palpations: Abdomen is soft. There is no mass.     Tenderness: There is no abdominal tenderness.  Musculoskeletal: Normal range of motion.        General: No deformity.  Skin:    General: Skin is warm and dry.     Findings: No erythema or rash.  Neurological:     Mental Status: He is alert and oriented to person, place, and time.     Cranial Nerves: No cranial nerve deficit.      Coordination: Coordination normal.  Psychiatric:        Behavior: Behavior normal.        Thought Content: Thought content normal.     CMP Latest Ref Rng & Units 05/11/2019  Glucose 70 - 99 mg/dL 106(H)  BUN 6 - 20 mg/dL 9  Creatinine 0.61 - 1.24 mg/dL 1.17  Sodium 135 - 145 mmol/L 139  Potassium 3.5 - 5.1 mmol/L 3.8  Chloride 98 - 111 mmol/L 106  CO2 22 - 32 mmol/L 22  Calcium 8.9 - 10.3 mg/dL 8.9  Total Protein 6.5 - 8.1 g/dL 7.0  Total Bilirubin 0.3 - 1.2 mg/dL 0.6  Alkaline Phos 38 - 126 U/L 56  AST 15 - 41 U/L 34  ALT 0 - 44 U/L 39   CBC Latest Ref Rng & Units 05/11/2019  WBC 4.0 - 10.5 K/uL 5.9  Hemoglobin 13.0 - 17.0 g/dL 15.4  Hematocrit 39.0 - 52.0 % 47.2  Platelets 150 - 400 K/uL 168    No results found.   Assessment and plan  1. Iron deficiency anemia, unspecified iron deficiency anemia type   2. B12 deficiency   3. Renal insufficiency    #Iron deficiency anemia, Labs are reviewed and discussed with patient. Hemoglobin stable. Iron panel indicates improvement. Hold additional IV iron treatments. Check UA.    #Vitamin B12 deficiency, continue oral B12 supplementation #Chronic renal insufficiency, creatinine is improved. Forms me that he is going to establish care with nephrology. Orders Placed This Encounter  Procedures  . Urinalysis, dipstick only    Standing Status:   Future    Number of Occurrences:   1    Standing Expiration Date:   05/16/2020  . CBC with Differential/Platelet    Standing Status:   Future    Standing Expiration Date:   05/16/2020  . Ferritin    Standing Status:   Future    Standing Expiration Date:   05/16/2020  . Iron and TIBC    Standing Status:   Future    Standing Expiration Date:   05/16/2020  . Vitamin B12    Standing Status:   Future    Standing Expiration Date:   05/16/2020       Earlie Server, MD, PhD Hematology Oncology [facility] Pager- [phone] 05/17/2019

## 2019-06-02 ENCOUNTER — Other Ambulatory Visit: Payer: Self-pay

## 2019-06-02 ENCOUNTER — Emergency Department
Admission: EM | Admit: 2019-06-02 | Discharge: 2019-06-02 | Disposition: A | Discharge status: Home / Self Care | Payer: Self-pay | Attending: Student in an Organized Health Care Education/Training Program | Admitting: Student in an Organized Health Care Education/Training Program

## 2019-06-02 ENCOUNTER — Encounter: Payer: Self-pay | Admitting: Emergency Medicine

## 2019-06-02 DIAGNOSIS — Z85038 Personal history of other malignant neoplasm of large intestine: Secondary | ICD-10-CM | POA: Insufficient documentation

## 2019-06-02 DIAGNOSIS — Z202 Contact with and (suspected) exposure to infections with a predominantly sexual mode of transmission: Secondary | ICD-10-CM | POA: Insufficient documentation

## 2019-06-02 DIAGNOSIS — Z79899 Other long term (current) drug therapy: Secondary | ICD-10-CM | POA: Insufficient documentation

## 2019-06-02 DIAGNOSIS — I10 Essential (primary) hypertension: Secondary | ICD-10-CM | POA: Insufficient documentation

## 2019-06-02 DIAGNOSIS — F17210 Nicotine dependence, cigarettes, uncomplicated: Secondary | ICD-10-CM | POA: Insufficient documentation

## 2019-06-02 MED ORDER — AZITHROMYCIN 500 MG PO TABS
1000.0000 mg | ORAL_TABLET | Freq: Every day | ORAL | Status: DC
  Administered 2019-06-02: 1000 mg via ORAL
  Filled 2019-06-02: qty 2

## 2019-06-02 MED ORDER — CEFTRIAXONE SODIUM 1 G IJ SOLR
500.0000 mg | Freq: Once | INTRAMUSCULAR | Status: AC
  Administered 2019-06-02: 500 mg via INTRAMUSCULAR
  Filled 2019-06-02: qty 10

## 2019-06-02 NOTE — ED Triage Notes (Signed)
Pt reports a girl he was with told him yesterday she was treated for chlamydia.  Pt states "I just need to get my shot".  Denies discharge.  Only symptom he admitted to was groin pain in right but denies now.

## 2019-06-02 NOTE — ED Provider Notes (Signed)
[facility] Emergency Department Provider Note  ____________________________________________  Time seen: Approximately 6:54 PM  I have reviewed the triage vital signs and the nursing notes.   HISTORY  Chief Complaint STD check    HPI Luke [name] is a 38 y.o. male who presents to the emergency department for treatment and evaluation after being told his girlfriend tested positive for chlamydia.  He is not having any symptoms of concern at this time.  He denies any nausea, vomiting, fever, abdominal or back pain.   Past Medical History:  Diagnosis Date  . Anemia   . Cancer ([place])    colon ca  . Cecum mass   . Colonic neoplasm   . Diverticulosis of large intestine without diverticulitis   . GERD (gastroesophageal reflux disease)   . Hypertension   . Pneumonia 09/07/2016  . Stomach irritation     Patient Active Problem List   Diagnosis Date Noted  . Unilateral inguinal hernia, without obstruction or gangrene, not specified as recurrent 05/01/2019  . Hypertension 08/17/2018  . Columnar-lined esophagus   . Gastric polyp   . Iron deficiency anemia 07/23/2018  . B12 deficiency 07/23/2018    Past Surgical History:  Procedure Laterality Date  . COLONOSCOPY WITH PROPOFOL N/A 07/28/2018   Procedure: COLONOSCOPY WITH PROPOFOL;  Surgeon: Virgel Manifold, MD;  Location: [facility] ENDOSCOPY;  Service: Endoscopy;  Laterality: N/A;  . ESOPHAGOGASTRODUODENOSCOPY (EGD) WITH PROPOFOL N/A 07/28/2018   Procedure: ESOPHAGOGASTRODUODENOSCOPY (EGD) WITH PROPOFOL;  Surgeon: Virgel Manifold, MD;  Location: [facility] ENDOSCOPY;  Service: Endoscopy;  Laterality: N/A;  . none    . PARTIAL COLECTOMY Right 08/15/2018   Procedure: RIGHT  COLECTOMY;  Surgeon: Vickie Epley, MD;  Location: [facility] ORS;  Service: General;  Laterality: Right;    Prior to Admission medications   Medication Sig Start Date End Date Taking? Authorizing Provider  amLODipine (NORVASC) 5 MG tablet  Take 1 tablet (5 mg total) by mouth daily. 08/19/18   Tylene Fantasia, PA-C  Cyanocobalamin (VITAMIN B-12 PO) Take 1 tablet by mouth daily.    [provider]  IRON PO Take 1 tablet by mouth daily.    [provider]    Allergies Patient has no known allergies.  Family History  Problem Relation Age of Onset  . CAD Maternal Grandmother   . Breast cancer Maternal Grandmother   . Breast cancer Mother   . Hypertension Mother   . Hypertension Father   . Colon cancer Paternal Uncle     Social History Social History   Tobacco Use  . Smoking status: Current Some Day Smoker    Packs/day: 0.10  . Smokeless tobacco: Never Used  . Tobacco comment: patient is social smoker/ smokes when he drinks   Substance Use Topics  . Alcohol use: Yes    Alcohol/week: 1.0 standard drinks    Types: 1 Cans of beer per week    Comment: patient reports he consumes ~2 beers about once per month  . Drug use: Not Currently    Types: Marijuana    Review of Systems Constitutional: Negative for fever. Respiratory: Negative for shortness of breath or cough. Gastrointestinal: Negative for abdominal pain; negative for nausea , negative for vomiting. Genitourinary: Negative for dysuria , negative for penile discharge. Musculoskeletal: Negative for back pain. Skin: Negative for acute skin changes/rash/lesion. ____________________________________________   PHYSICAL EXAM:  VITAL SIGNS: ED Triage Vitals  Enc Vitals Group     BP 06/02/19 1738 133/86  Pulse Rate 06/02/19 1738 80     Resp 06/02/19 1738 16     Temp 06/02/19 1737 99 F (37.2 C)     Temp Source 06/02/19 1737 Oral     SpO2 06/02/19 1738 94 %     Weight 06/02/19 1738 200 lb (90.7 kg)     Height 06/02/19 1738 5\' 8"  (1.727 m)     Head Circumference --      Peak Flow --      Pain Score 06/02/19 1738 0     Pain Loc --      Pain Edu? --      Excl. in [place]? --     Constitutional: Alert and oriented. Well appearing and in  no acute distress. Eyes: Conjunctivae are normal. Head: Atraumatic. Nose: No congestion/rhinnorhea. Mouth/Throat: Mucous membranes are moist. Respiratory: Normal respiratory effort.  No retractions. Gastrointestinal: Bowel sounds active x 4; Abdomen is soft without rebound or guarding. Genitourinary: No testicular pain.  No penile discharge. Musculoskeletal: No extremity tenderness nor edema.  Neurologic:  Normal speech and language. No gross focal neurologic deficits are appreciated. Speech is normal. No gait instability. Skin:  Skin is warm, dry and intact. No rash noted on exposed skin. Psychiatric: Mood and affect are normal. Speech and behavior are normal.  ____________________________________________   LABS (all labs ordered are listed, but only abnormal results are displayed)  Labs Reviewed - No data to display ____________________________________________  RADIOLOGY  Not indicated ____________________________________________  Procedures  ____________________________________________  38 year old male presenting to the emergency department for treatment after exposure to chlamydia.  He is currently not experiencing any symptoms, but wishes to have medications.  He was advised that he will need to avoid intercourse for at least 7 days.  He will be directed to follow-up with [facility] for further testing/treatment if needed.  He is to return to the emergency department for symptoms of change or worsen if unable to schedule appointment.  INITIAL IMPRESSION / ASSESSMENT AND PLAN / ED COURSE  Pertinent labs & imaging results that were available during my care of the patient were reviewed by me and considered in my medical decision making (see chart for details).  ____________________________________________   FINAL CLINICAL IMPRESSION(S) / ED DIAGNOSES  Final diagnoses:  Exposure to chlamydia    Note:  This document was prepared using Dragon voice  recognition software and may include unintentional dictation errors.   Victorino Dike, FNP 06/02/19 Einar Crow    Merlyn Lot, MD 06/02/19 [phone]

## 2019-06-22 ENCOUNTER — Encounter: Payer: Self-pay | Admitting: Intensive Care

## 2019-06-22 ENCOUNTER — Emergency Department
Admission: EM | Admit: 2019-06-22 | Discharge: 2019-06-22 | Disposition: A | Discharge status: Home / Self Care | Payer: Self-pay | Attending: Emergency Medicine | Admitting: Emergency Medicine

## 2019-06-22 ENCOUNTER — Other Ambulatory Visit: Payer: Self-pay

## 2019-06-22 DIAGNOSIS — I10 Essential (primary) hypertension: Secondary | ICD-10-CM | POA: Insufficient documentation

## 2019-06-22 DIAGNOSIS — R21 Rash and other nonspecific skin eruption: Secondary | ICD-10-CM | POA: Insufficient documentation

## 2019-06-22 DIAGNOSIS — Z79899 Other long term (current) drug therapy: Secondary | ICD-10-CM | POA: Insufficient documentation

## 2019-06-22 DIAGNOSIS — Z85038 Personal history of other malignant neoplasm of large intestine: Secondary | ICD-10-CM | POA: Insufficient documentation

## 2019-06-22 MED ORDER — FAMOTIDINE 20 MG PO TABS: 20.0000 mg | ORAL_TABLET | Freq: Two times a day (BID) | ORAL | 1 refills | Status: DC

## 2019-06-22 MED ORDER — DIPHENHYDRAMINE HCL 25 MG PO TABS: 25.0000 mg | ORAL_TABLET | Freq: Three times a day (TID) | ORAL | 0 refills | Status: DC | PRN

## 2019-06-22 MED ORDER — PREDNISONE 10 MG (21) PO TBPK: ORAL_TABLET | ORAL | 0 refills | Status: DC

## 2019-06-22 MED ORDER — FAMOTIDINE 20 MG PO TABS
40.0000 mg | ORAL_TABLET | Freq: Once | ORAL | Status: AC
  Administered 2019-06-22: 40 mg via ORAL
  Filled 2019-06-22: qty 2

## 2019-06-22 MED ORDER — TRIAMCINOLONE ACETONIDE 40 MG/ML IJ SUSP
40.0000 mg | Freq: Once | INTRAMUSCULAR | Status: AC
  Administered 2019-06-22: 40 mg via INTRAMUSCULAR
  Filled 2019-06-22: qty 1

## 2019-06-22 NOTE — ED Triage Notes (Addendum)
Patient seen on 06/02/19 and reports having a shot and started experiencing hives the next day. Hives present all over abd and back and bilateral arms. C/o itching all over

## 2019-06-22 NOTE — ED Provider Notes (Signed)
[facility] Emergency Department Provider Note  ____________________________________________  Time seen: Approximately 6:01 PM  I have reviewed the triage vital signs and the nursing notes.   HISTORY  Chief Complaint Rash    HPI Luke [name] is a 38 y.o. male presents to the emergency department with a pruritic erythematous, maculopapular rash of abdomen, back and upper extremities that developed after patient had Rocephin and azithromycin after STD exposure.  Patient also has small plaque-like rash of the back.  He denies prodrome of rhinorrhea, nasal congestion or nonproductive cough.  No herald patch.  Patient states that he recently went dirt bike regularly but did not ride through brush or other foliage.  He denies shortness of breath, chest tightness, chest pain, nausea, vomiting or syncope.  No penile discharge, dysuria or increased urinary frequency.  He denies any history of anaphylaxis.  No other alleviating measures have been attempted.        Past Medical History:  Diagnosis Date  . Anemia   . Cancer ([place])    colon ca  . Cecum mass   . Colonic neoplasm   . Diverticulosis of large intestine without diverticulitis   . GERD (gastroesophageal reflux disease)   . Hypertension   . Pneumonia 09/07/2016  . Stomach irritation     Patient Active Problem List   Diagnosis Date Noted  . Unilateral inguinal hernia, without obstruction or gangrene, not specified as recurrent 05/01/2019  . Hypertension 08/17/2018  . Columnar-lined esophagus   . Gastric polyp   . Iron deficiency anemia 07/23/2018  . B12 deficiency 07/23/2018    Past Surgical History:  Procedure Laterality Date  . COLONOSCOPY WITH PROPOFOL N/A 07/28/2018   Procedure: COLONOSCOPY WITH PROPOFOL;  Surgeon: Virgel Manifold, MD;  Location: [facility] ENDOSCOPY;  Service: Endoscopy;  Laterality: N/A;  . ESOPHAGOGASTRODUODENOSCOPY (EGD) WITH PROPOFOL N/A 07/28/2018   Procedure:  ESOPHAGOGASTRODUODENOSCOPY (EGD) WITH PROPOFOL;  Surgeon: Virgel Manifold, MD;  Location: [facility] ENDOSCOPY;  Service: Endoscopy;  Laterality: N/A;  . none    . PARTIAL COLECTOMY Right 08/15/2018   Procedure: RIGHT  COLECTOMY;  Surgeon: Vickie Epley, MD;  Location: [facility] ORS;  Service: General;  Laterality: Right;    Prior to Admission medications   Medication Sig Start Date End Date Taking? Authorizing Provider  amLODipine (NORVASC) 5 MG tablet Take 1 tablet (5 mg total) by mouth daily. 08/19/18   Tylene Fantasia, PA-C  Cyanocobalamin (VITAMIN B-12 PO) Take 1 tablet by mouth daily.    [provider]  diphenhydrAMINE (BENADRYL ALLERGY) 25 MG tablet Take 1 tablet (25 mg total) by mouth every 8 (eight) hours as needed for up to 7 days. 06/22/19 06/29/19  Lannie Fields, PA-C  famotidine (PEPCID) 20 MG tablet Take 1 tablet (20 mg total) by mouth 2 (two) times daily for 7 days. 06/22/19 06/29/19  Lannie Fields, PA-C  IRON PO Take 1 tablet by mouth daily.    [provider]  predniSONE (STERAPRED UNI-PAK 21 TAB) 10 MG (21) TBPK tablet Take 6 tablets the first day, take 5 tablets the second day, take 4 tablets the third day, take 3 tablets the fourth day, take 2 tablets the fifth day, take 1 tablet the sixth day. 06/22/19   Lannie Fields, PA-C    Allergies Patient has no known allergies.  Family History  Problem Relation Age of Onset  . CAD Maternal Grandmother   . Breast cancer Maternal Grandmother   . Breast cancer Mother   .  Hypertension Mother   . Hypertension Father   . Colon cancer Paternal Uncle     Social History Social History   Tobacco Use  . Smoking status: Never Smoker  . Smokeless tobacco: Never Used  . Tobacco comment: patient is social smoker/ smokes when he drinks   Substance Use Topics  . Alcohol use: Yes    Alcohol/week: 1.0 standard drinks    Types: 1 Cans of beer per week    Comment: patient reports he consumes ~2 beers about once per  month  . Drug use: Not Currently    Types: Marijuana     Review of Systems  Constitutional: No fever/chills Eyes: No visual changes. No discharge ENT: No upper respiratory complaints. Cardiovascular: no chest pain. Respiratory: no cough. No SOB. Gastrointestinal: No abdominal pain.  No nausea, no vomiting.  No diarrhea.  No constipation. Genitourinary: Negative for dysuria. No hematuria Musculoskeletal: Negative for musculoskeletal pain. Skin: Patient has rash.  Neurological: Negative for headaches, focal weakness or numbness.   ____________________________________________   PHYSICAL EXAM:  VITAL SIGNS: ED Triage Vitals  Enc Vitals Group     BP 06/22/19 1733 (!) 147/105     Pulse Rate 06/22/19 1732 74     Resp 06/22/19 1732 16     Temp 06/22/19 1732 98.5 F (36.9 C)     Temp Source 06/22/19 1732 Oral     SpO2 06/22/19 1732 98 %     Weight 06/22/19 1732 201 lb 11.2 oz (91.5 kg)     Height 06/22/19 1732 5\' 8"  (1.727 m)     Head Circumference --      Peak Flow --      Pain Score 06/22/19 1732 0     Pain Loc --      Pain Edu? --      Excl. in [place]? --      Constitutional: Alert and oriented. Well appearing and in no acute distress. Eyes: Conjunctivae are normal. PERRL. EOMI. Head: Atraumatic. Cardiovascular: Normal rate, regular rhythm. Normal S1 and S2.  Good peripheral circulation. Respiratory: Normal respiratory effort without tachypnea or retractions. Lungs CTAB. Good air entry to the bases with no decreased or absent breath sounds. Gastrointestinal: Bowel sounds 4 quadrants. Soft and nontender to palpation. No guarding or rigidity. No palpable masses. No distention. No CVA tenderness. Musculoskeletal: Full range of motion to all extremities. No gross deformities appreciated. Neurologic:  Normal speech and language. No gross focal neurologic deficits are appreciated.  Skin: Rash is maculopapular and dry with no vesicle formation.  Distribution is irregular.  There  are 2-3, 1 cm circular plaque-like regions on patient's back.   Psychiatric: Mood and affect are normal. Speech and behavior are normal. Patient exhibits appropriate insight and judgement.   ____________________________________________   LABS (all labs ordered are listed, but only abnormal results are displayed)  Labs Reviewed - No data to display ____________________________________________  EKG   ____________________________________________  RADIOLOGY   No results found.  ____________________________________________    PROCEDURES  Procedure(s) performed:    Procedures    Medications  triamcinolone acetonide (KENALOG-40) injection 40 mg (40 mg Intramuscular Given 06/22/19 1843)  famotidine (PEPCID) tablet 40 mg (40 mg Oral Given 06/22/19 1805)     ____________________________________________   INITIAL IMPRESSION / ASSESSMENT AND PLAN / ED COURSE  Pertinent labs & imaging results that were available during my care of the patient were reviewed by me and considered in my medical decision making (see chart for details).  Review  of the [place] CSRS was performed in accordance of the [place] prior to dispensing any controlled drugs.           Assessment and Plan:  Rash 38 year old male presents to the emergency department with a rash that has occurred shortly after receiving Rocephin and azithromycin.  Patient was mildly hypertensive at triage but vital signs were otherwise reassuring.   Rash was erythematous, maculopapular and irregularly distributed.  There were linear excoriations and circumferential plaque-like regions of back.  Differential diagnosis includes poison ivy/poison oak dermatitis, drug reaction, pityriasis rosea, secondary syphilis...  Patient was advised to follow-up with local health department for full STD panel.  Patient was given an injection of Kenalog in the emergency department and Pepcid.  He was discharged with tapered prednisone, Pepcid and  Benadryl.  Patient was advised to reassess his symptoms in 1 week for symptomatic improvement.  He was given return precautions to return to the emergency department if symptoms worsen.  All patient questions were answered. ____________________________________________  FINAL CLINICAL IMPRESSION(S) / ED DIAGNOSES  Final diagnoses:  Rash      NEW MEDICATIONS STARTED DURING THIS VISIT:  ED Discharge Orders         Ordered    predniSONE (STERAPRED UNI-PAK 21 TAB) 10 MG (21) TBPK tablet     06/22/19 1854    famotidine (PEPCID) 20 MG tablet  2 times daily     06/22/19 1854    diphenhydrAMINE (BENADRYL ALLERGY) 25 MG tablet  Every 8 hours PRN     06/22/19 1854              This chart was dictated using voice recognition software/Dragon. Despite best efforts to proofread, errors can occur which can change the meaning. Any change was purely unintentional.    Lannie Fields, PA-C 06/22/19 1900    Vanessa [place], MD 06/23/19 [phone]

## 2019-06-22 NOTE — Discharge Instructions (Addendum)
Take tapered prednisone over the next week. Take Benadryl and famotidine twice daily over the next week. Return to the emergency department if symptoms worsen.

## 2019-08-02 ENCOUNTER — Telehealth: Payer: Self-pay

## 2019-08-15 ENCOUNTER — Ambulatory Visit: Payer: Self-pay | Admitting: Gerontology

## 2019-08-15 ENCOUNTER — Other Ambulatory Visit: Payer: Self-pay

## 2019-08-15 VITALS — BP 126/87 | HR 80 | Ht 68.0 in | Wt 194.0 lb

## 2019-08-15 DIAGNOSIS — D509 Iron deficiency anemia, unspecified: Secondary | ICD-10-CM

## 2019-08-15 DIAGNOSIS — Z8719 Personal history of other diseases of the digestive system: Secondary | ICD-10-CM

## 2019-08-15 DIAGNOSIS — K5900 Constipation, unspecified: Secondary | ICD-10-CM

## 2019-08-15 DIAGNOSIS — K219 Gastro-esophageal reflux disease without esophagitis: Secondary | ICD-10-CM | POA: Insufficient documentation

## 2019-08-15 DIAGNOSIS — I10 Essential (primary) hypertension: Secondary | ICD-10-CM

## 2019-08-15 MED ORDER — PANTOPRAZOLE SODIUM 40 MG PO TBEC: 40.0000 mg | DELAYED_RELEASE_TABLET | Freq: Every day | ORAL | 2 refills | Status: DC

## 2019-08-15 MED ORDER — POLYETHYLENE GLYCOL 3350 17 G PO PACK: 17.0000 g | PACK | Freq: Every day | ORAL | 0 refills | Status: DC

## 2019-08-15 NOTE — Progress Notes (Signed)
Established Patient Office Visit  Subjective:  Patient ID: Luke [name], male    DOB: May 26, 1981  Age: 38 y.o. MRN: [id number]  CC:  Chief Complaint  Patient presents with  . Hypertension    HPI Luke [name] presents for follow up after being non compliant with his appointments. He reports that he has hypertension and he's compliant with his medications, adheres to healthy diet and exercises as tolerated. He had partial right colectomy with primary linear stapled anastomosis on 08/15/2018 secondary to neoplasm of the cecum.    Currently he reports that he has being experiencing worsening constipation that has being going on for more than 3 months. He reports that sometimes he moves his bowel once a week, his stool is hard, straining with defecation, but denies hematochezia. He also states that he is experiencing mild to moderate discomfort due to his right inguinal hernia, and it's bothersome to him.  He followed up with Dr Earlie Server on 05/17/2019 for iron deficiency anemia, and vitamin B12 deficiency. He continues to take Vitamin B 12 and Iron supplements daily. He denies hematuria, hematochezia and black tarry stool. He states that he has a history of acid reflux with is controlled with taking Protonix 40 mg daily.He states that he's doing well and offers no further complaint.  Past Medical History:  Diagnosis Date  . Anemia   . Cancer ([place])    colon ca  . Cecum mass   . Colonic neoplasm   . Diverticulosis of large intestine without diverticulitis   . GERD (gastroesophageal reflux disease)   . Hypertension   . Pneumonia 09/07/2016  . Stomach irritation     Past Surgical History:  Procedure Laterality Date  . COLONOSCOPY WITH PROPOFOL N/A 07/28/2018   Procedure: COLONOSCOPY WITH PROPOFOL;  Surgeon: Virgel Manifold, MD;  Location: [facility] ENDOSCOPY;  Service: Endoscopy;  Laterality: N/A;  . ESOPHAGOGASTRODUODENOSCOPY (EGD) WITH PROPOFOL N/A 07/28/2018   Procedure:  ESOPHAGOGASTRODUODENOSCOPY (EGD) WITH PROPOFOL;  Surgeon: Virgel Manifold, MD;  Location: [facility] ENDOSCOPY;  Service: Endoscopy;  Laterality: N/A;  . none    . PARTIAL COLECTOMY Right 08/15/2018   Procedure: RIGHT  COLECTOMY;  Surgeon: Vickie Epley, MD;  Location: [facility] ORS;  Service: General;  Laterality: Right;    Family History  Problem Relation Age of Onset  . CAD Maternal Grandmother   . Breast cancer Maternal Grandmother   . Breast cancer Mother   . Hypertension Mother   . Hypertension Father   . Colon cancer Paternal Uncle     Social History   Socioeconomic History  . Marital status: Single    Spouse name: Not on file  . Number of children: Not on file  . Years of education: Not on file  . Highest education level: Not on file  Occupational History  . Not on file  Social Needs  . Financial resource strain: Not on file  . Food insecurity    Worry: Not on file    Inability: Not on file  . Transportation needs    Medical: Not on file    Non-medical: Not on file  Tobacco Use  . Smoking status: Never Smoker  . Smokeless tobacco: Never Used  . Tobacco comment: patient is social smoker/ smokes when he drinks   Substance and Sexual Activity  . Alcohol use: Yes    Alcohol/week: 1.0 standard drinks    Types: 1 Cans of beer per week    Comment: patient reports he consumes ~2  beers about once per month  . Drug use: Not Currently    Types: Marijuana  . Sexual activity: Yes    Birth control/protection: Condom  Lifestyle  . Physical activity    Days per week: Not on file    Minutes per session: Not on file  . Stress: Not on file  Relationships  . Social Herbalist on phone: Not on file    Gets together: Not on file    Attends religious service: Not on file    Active member of club or organization: Not on file    Attends meetings of clubs or organizations: Not on file    Relationship status: Not on file  . Intimate partner violence    Fear of  current or ex partner: Not on file    Emotionally abused: Not on file    Physically abused: Not on file    Forced sexual activity: Not on file  Other Topics Concern  . Not on file  Social History Narrative  . Not on file    Outpatient Medications Prior to Visit  Medication Sig Dispense Refill  . amLODipine (NORVASC) 5 MG tablet Take 1 tablet (5 mg total) by mouth daily. 30 tablet 3  . Cyanocobalamin (VITAMIN B-12 PO) Take 1 tablet by mouth daily.    . IRON PO Take 1 tablet by mouth daily.    . diphenhydrAMINE (BENADRYL ALLERGY) 25 MG tablet Take 1 tablet (25 mg total) by mouth every 8 (eight) hours as needed for up to 7 days. (Patient not taking: Reported on 08/15/2019) 30 tablet 0  . famotidine (PEPCID) 20 MG tablet Take 1 tablet (20 mg total) by mouth 2 (two) times daily for 7 days. (Patient not taking: Reported on 08/15/2019) 14 tablet 1  . predniSONE (STERAPRED UNI-PAK 21 TAB) 10 MG (21) TBPK tablet Take 6 tablets the first day, take 5 tablets the second day, take 4 tablets the third day, take 3 tablets the fourth day, take 2 tablets the fifth day, take 1 tablet the sixth day. 21 tablet 0   No facility-administered medications prior to visit.     No Known Allergies  ROS Review of Systems  Constitutional: Negative.  Negative for fatigue.  Respiratory: Negative.   Cardiovascular: Negative.   Gastrointestinal: Positive for constipation.  Genitourinary: Negative.   Skin: Negative.   Neurological: Negative.   Psychiatric/Behavioral: Negative.       Objective:    Physical Exam  Constitutional: He is oriented to person, place, and time. He appears well-developed.  HENT:  Head: Normocephalic and atraumatic.  Eyes: Pupils are equal, round, and reactive to light. EOM are normal.  Neck: Normal range of motion.  Cardiovascular: Normal rate and regular rhythm.  Pulmonary/Chest: Effort normal and breath sounds normal.  Abdominal: Soft. Bowel sounds are normal.  Musculoskeletal:  Normal range of motion.  Neurological: He is alert and oriented to person, place, and time.  Skin: Skin is warm and dry.  Psychiatric: He has a normal mood and affect. His behavior is normal. Judgment and thought content normal.    BP 126/87 (BP Location: Left Arm, Patient Position: Sitting)   Pulse 80   Ht 5\' 8"  (1.727 m)   Wt 194 lb (88 kg)   SpO2 97%   BMI 29.50 kg/m  Wt Readings from Last 3 Encounters:  08/15/19 194 lb (88 kg)  06/22/19 201 lb 11.2 oz (91.5 kg)  06/02/19 200 lb (90.7 kg)  Health Maintenance Due  Topic Date Due  . TETANUS/TDAP  12/27/1998    There are no preventive care reminders to display for this patient.  Lab Results  Component Value Date   TSH 0.622 07/04/2018   Lab Results  Component Value Date   WBC 5.9 05/11/2019   HGB 15.4 05/11/2019   HCT 47.2 05/11/2019   MCV 81.8 05/11/2019   PLT 168 05/11/2019   Lab Results  Component Value Date   NA 139 05/11/2019   K 3.8 05/11/2019   CO2 22 05/11/2019   GLUCOSE 106 (H) 05/11/2019   BUN 9 05/11/2019   CREATININE 1.17 05/11/2019   BILITOT 0.6 05/11/2019   ALKPHOS 56 05/11/2019   AST 34 05/11/2019   ALT 39 05/11/2019   PROT 7.0 05/11/2019   ALBUMIN 3.8 05/11/2019   CALCIUM 8.9 05/11/2019   ANIONGAP 11 05/11/2019   No results found for: CHOL No results found for: HDL No results found for: LDLCALC No results found for: TRIG No results found for: CHOLHDL Lab Results  Component Value Date   HGBA1C 5.8 (H) 09/07/2016      Assessment & Plan:     1. Essential hypertension -His blood pressure is controlled and he will continue on current treatment regimen. -Low salt DASH diet -Take medications regularly on time -Exercise regularly as tolerated -Check blood pressure at least once a week at home or a nearby pharmacy and record -Goal is less than 140/90 and normal blood pressure is 120/80    2. Iron deficiency anemia, unspecified iron deficiency anemia type - He will continue on  Vit B12 and Iron supplement, will follow up with Dr Earlie Server on 09/11/2019. He was advised to notify clinic for hematuria, hematochezia,or black tarry stool.  3. Constipation, unspecified constipation type  - Constipation might likely be due to Iron supplement and giving his history of his right partial colectomy, he was encouraged to complete charity care application for Ambulatory referral to Gastroenterology - He will continue on medication, educated on side effects and to notify clinic. -Increase intake of vegetable,fruits with high fiber -Drink more fluids and exercise as tolerated - polyethylene glycol (MIRALAX) 17 g packet; Take 17 g by mouth daily.  Dispense: 14 each; Refill: 0  4. History of inguinal hernia   - He was encouraged to complete charity care application  Ambulatory referral to General Surgery  5. Gastroesophageal reflux disease without esophagitis - He will continue on current treatment regimen. -Avoid spicy, fatty and fried food -Avoid sodas and sour juices -Avoid heavy meals -Avoid eating 4 hours before bedtime -Elevate head of bed at night - pantoprazole (PROTONIX) 40 MG tablet; Take 1 tablet (40 mg total) by mouth daily.  Dispense: 30 tablet; Refill: 2   Follow-up: Return in about 2 months (around 10/15/2019), or if symptoms worsen or fail to improve.    Susi Goslin Jerold Coombe, NP

## 2019-08-15 NOTE — Patient Instructions (Signed)
Constipation, Adult Constipation is when a person:  Poops (has a bowel movement) fewer times in a week than normal.  Has a hard time pooping.  Has poop that is dry, hard, or bigger than normal. Follow these instructions at home: Eating and drinking   Eat foods that have a lot of fiber, such as: ? Fresh fruits and vegetables. ? Whole grains. ? Beans.  Eat less of foods that are high in fat, low in fiber, or overly processed, such as: ? [place] fries. ? Hamburgers. ? Cookies. ? Candy. ? Soda.  Drink enough fluid to keep your pee (urine) clear or pale yellow. General instructions  Exercise regularly or as told by your doctor.  Go to the restroom when you feel like you need to poop. Do not hold it in.  Take over-the-counter and prescription medicines only as told by your doctor. These include any fiber supplements.  Do pelvic floor retraining exercises, such as: ? Doing deep breathing while relaxing your lower belly (abdomen). ? Relaxing your pelvic floor while pooping.  Watch your condition for any changes.  Keep all follow-up visits as told by your doctor. This is important. Contact a doctor if:  You have pain that gets worse.  You have a fever.  You have not pooped for 4 days.  You throw up (vomit).  You are not hungry.  You lose weight.  You are bleeding from the anus.  You have thin, pencil-like poop (stool). Get help right away if:  You have a fever, and your symptoms suddenly get worse.  You leak poop or have blood in your poop.  Your belly feels hard or bigger than normal (is bloated).  You have very bad belly pain.  You feel dizzy or you faint. This information is not intended to replace advice given to you by your health care provider. Make sure you discuss any questions you have with your health care provider. Document Released: 04/06/2008 Document Revised: 10/01/2017 Document Reviewed: 04/08/2016 Elsevier Patient Education  2020 [place] DASH stands for "Dietary Approaches to Stop Hypertension." The DASH eating plan is a healthy eating plan that has been shown to reduce high blood pressure (hypertension). It may also reduce your risk for type 2 diabetes, heart disease, and stroke. The DASH eating plan may also help with weight loss. What are tips for following this plan?  General guidelines  Avoid eating more than 2,300 mg (milligrams) of salt (sodium) a day. If you have hypertension, you may need to reduce your sodium intake to 1,500 mg a day.  Limit alcohol intake to no more than 1 drink a day for nonpregnant women and 2 drinks a day for men. One drink equals 12 oz of beer, 5 oz of wine, or 1 oz of hard liquor.  Work with your health care provider to maintain a healthy body weight or to lose weight. Ask what an ideal weight is for you.  Get at least 30 minutes of exercise that causes your heart to beat faster (aerobic exercise) most days of the week. Activities may include walking, swimming, or biking.  Work with your health care provider or diet and nutrition specialist (dietitian) to adjust your eating plan to your individual calorie needs. Reading food labels   Check food labels for the amount of sodium per serving. Choose foods with less than 5 percent of the Daily Value of sodium. Generally, foods with less than 300 mg of sodium per serving fit into  this eating plan.  To find whole grains, look for the word "whole" as the first word in the ingredient list. Shopping  Buy products labeled as "low-sodium" or "no salt added."  Buy fresh foods. Avoid canned foods and premade or frozen meals. Cooking  Avoid adding salt when cooking. Use salt-free seasonings or herbs instead of table salt or sea salt. Check with your health care provider or pharmacist before using salt substitutes.  Do not fry foods. Cook foods using healthy methods such as baking, boiling, grilling, and broiling instead.  Cook  with heart-healthy oils, such as olive, canola, soybean, or sunflower oil. Meal planning  Eat a balanced diet that includes: ? 5 or more servings of fruits and vegetables each day. At each meal, try to fill half of your plate with fruits and vegetables. ? Up to 6-8 servings of whole grains each day. ? Less than 6 oz of lean meat, poultry, or fish each day. A 3-oz serving of meat is about the same size as a deck of cards. One egg equals 1 oz. ? 2 servings of low-fat dairy each day. ? A serving of nuts, seeds, or beans 5 times each week. ? Heart-healthy fats. Healthy fats called Omega-3 fatty acids are found in foods such as flaxseeds and coldwater fish, like sardines, salmon, and mackerel.  Limit how much you eat of the following: ? Canned or prepackaged foods. ? Food that is high in trans fat, such as fried foods. ? Food that is high in saturated fat, such as fatty meat. ? Sweets, desserts, sugary drinks, and other foods with added sugar. ? Full-fat dairy products.  Do not salt foods before eating.  Try to eat at least 2 vegetarian meals each week.  Eat more home-cooked food and less restaurant, buffet, and fast food.  When eating at a restaurant, ask that your food be prepared with less salt or no salt, if possible. What foods are recommended? The items listed may not be a complete list. Talk with your dietitian about what dietary choices are best for you. Grains Whole-grain or whole-wheat bread. Whole-grain or whole-wheat pasta. Brown rice. [place]. Bulgur. Whole-grain and low-sodium cereals. Pita bread. Low-fat, low-sodium crackers. Whole-wheat flour tortillas. Vegetables Fresh or frozen vegetables (raw, steamed, roasted, or grilled). Low-sodium or reduced-sodium tomato and vegetable juice. Low-sodium or reduced-sodium tomato sauce and tomato paste. Low-sodium or reduced-sodium canned vegetables. Fruits All fresh, dried, or frozen fruit. Canned fruit in natural juice  (without added sugar). Meat and other protein foods Skinless chicken or [place]. Ground chicken or [place]. Pork with fat trimmed off. Fish and seafood. Egg whites. Dried beans, peas, or lentils. Unsalted nuts, nut butters, and seeds. Unsalted canned beans. Lean cuts of beef with fat trimmed off. Low-sodium, lean deli meat. Dairy Low-fat (1%) or fat-free (skim) milk. Fat-free, low-fat, or reduced-fat cheeses. Nonfat, low-sodium ricotta or cottage cheese. Low-fat or nonfat yogurt. Low-fat, low-sodium cheese. Fats and oils Soft margarine without trans fats. Vegetable oil. Low-fat, reduced-fat, or light mayonnaise and salad dressings (reduced-sodium). Canola, safflower, olive, soybean, and sunflower oils. Avocado. Seasoning and other foods Herbs. Spices. Seasoning mixes without salt. Unsalted popcorn and pretzels. Fat-free sweets. What foods are not recommended? The items listed may not be a complete list. Talk with your dietitian about what dietary choices are best for you. Grains Baked goods made with fat, such as croissants, muffins, or some breads. Dry pasta or rice meal packs. Vegetables Creamed or fried vegetables. Vegetables in a cheese sauce.  Regular canned vegetables (not low-sodium or reduced-sodium). Regular canned tomato sauce and paste (not low-sodium or reduced-sodium). Regular tomato and vegetable juice (not low-sodium or reduced-sodium). Angie Fava. Olives. Fruits Canned fruit in a light or heavy syrup. Fried fruit. Fruit in cream or butter sauce. Meat and other protein foods Fatty cuts of meat. Ribs. Fried meat. Berniece Salines. Sausage. Bologna and other processed lunch meats. Salami. Fatback. Hotdogs. Bratwurst. Salted nuts and seeds. Canned beans with added salt. Canned or smoked fish. Whole eggs or egg yolks. Chicken or [place] with skin. Dairy Whole or 2% milk, cream, and half-and-half. Whole or full-fat cream cheese. Whole-fat or sweetened yogurt. Full-fat cheese. Nondairy creamers. Whipped  toppings. Processed cheese and cheese spreads. Fats and oils Butter. Stick margarine. Lard. Shortening. Ghee. Bacon fat. Tropical oils, such as coconut, palm kernel, or palm oil. Seasoning and other foods Salted popcorn and pretzels. Onion salt, garlic salt, seasoned salt, table salt, and sea salt. Worcestershire sauce. Tartar sauce. Barbecue sauce. Teriyaki sauce. Soy sauce, including reduced-sodium. Steak sauce. Canned and packaged gravies. Fish sauce. Oyster sauce. Cocktail sauce. Horseradish that you find on the shelf. Ketchup. Mustard. Meat flavorings and tenderizers. Bouillon cubes. Hot sauce and Tabasco sauce. Premade or packaged marinades. Premade or packaged taco seasonings. Relishes. Regular salad dressings. Where to find more information:  National Heart, Lung, and [place]: https://wilson-eaton.com/  American Heart Association: www.heart.org Summary  The DASH eating plan is a healthy eating plan that has been shown to reduce high blood pressure (hypertension). It may also reduce your risk for type 2 diabetes, heart disease, and stroke.  With the DASH eating plan, you should limit salt (sodium) intake to 2,300 mg a day. If you have hypertension, you may need to reduce your sodium intake to 1,500 mg a day.  When on the DASH eating plan, aim to eat more fresh fruits and vegetables, whole grains, lean proteins, low-fat dairy, and heart-healthy fats.  Work with your health care provider or diet and nutrition specialist (dietitian) to adjust your eating plan to your individual calorie needs. This information is not intended to replace advice given to you by your health care provider. Make sure you discuss any questions you have with your health care provider. Document Released: 10/08/2011 Document Revised: 10/01/2017 Document Reviewed: 10/12/2016 Elsevier Patient Education  2020 [employer].

## 2019-08-24 ENCOUNTER — Other Ambulatory Visit: Payer: Self-pay

## 2019-08-24 DIAGNOSIS — D509 Iron deficiency anemia, unspecified: Secondary | ICD-10-CM

## 2019-08-24 DIAGNOSIS — E538 Deficiency of other specified B group vitamins: Secondary | ICD-10-CM

## 2019-08-24 DIAGNOSIS — N289 Disorder of kidney and ureter, unspecified: Secondary | ICD-10-CM

## 2019-08-25 LAB — CBC WITH DIFFERENTIAL/PLATELET
Basophils Absolute: 0.1 10*3/uL (ref 0.0–0.2)
Basos: 1 %
EOS (ABSOLUTE): 0.1 10*3/uL (ref 0.0–0.4)
Eos: 1 %
Hematocrit: 46 % (ref 37.5–51.0)
Hemoglobin: 15.9 g/dL (ref 13.0–17.7)
Immature Grans (Abs): 0 10*3/uL (ref 0.0–0.1)
Immature Granulocytes: 0 %
Lymphocytes Absolute: 2.6 10*3/uL (ref 0.7–3.1)
Lymphs: 35 %
MCH: 29.2 pg (ref 26.6–33.0)
MCHC: 34.6 g/dL (ref 31.5–35.7)
MCV: 85 fL (ref 79–97)
Monocytes Absolute: 0.4 10*3/uL (ref 0.1–0.9)
Monocytes: 5 %
Neutrophils Absolute: 4.3 10*3/uL (ref 1.4–7.0)
Neutrophils: 58 %
Platelets: 207 10*3/uL (ref 150–450)
RBC: 5.44 x10E6/uL (ref 4.14–5.80)
RDW: 13.1 % (ref 11.6–15.4)
WBC: 7.4 10*3/uL (ref 3.4–10.8)

## 2019-08-25 LAB — IRON AND TIBC
Iron Saturation: 29 % (ref 15–55)
Iron: 81 ug/dL (ref 38–169)
Total Iron Binding Capacity: 279 ug/dL (ref 250–450)
UIBC: 198 ug/dL (ref 111–343)

## 2019-08-25 LAB — VITAMIN B12: Vitamin B-12: 478 pg/mL (ref 232–1245)

## 2019-08-25 LAB — FERRITIN: Ferritin: 147 ng/mL (ref 30–400)

## 2019-08-28 ENCOUNTER — Ambulatory Visit: Payer: Self-pay | Admitting: Surgery

## 2019-08-30 ENCOUNTER — Encounter: Payer: Self-pay | Admitting: Surgery

## 2019-08-30 ENCOUNTER — Ambulatory Visit (INDEPENDENT_AMBULATORY_CARE_PROVIDER_SITE_OTHER): Payer: Self-pay | Admitting: Surgery

## 2019-08-30 ENCOUNTER — Other Ambulatory Visit: Payer: Self-pay

## 2019-08-30 ENCOUNTER — Ambulatory Visit: Payer: Self-pay | Admitting: Surgery

## 2019-08-30 VITALS — BP 138/91 | HR 83 | Temp 97.9°F | Resp 14 | Ht 68.0 in | Wt 199.4 lb

## 2019-08-30 DIAGNOSIS — K4090 Unilateral inguinal hernia, without obstruction or gangrene, not specified as recurrent: Secondary | ICD-10-CM

## 2019-08-30 DIAGNOSIS — D374 Neoplasm of uncertain behavior of colon: Secondary | ICD-10-CM

## 2019-08-30 NOTE — Patient Instructions (Addendum)
Referral sent to [facility]. [phone]. Someone from their office will call to schedule an appointment. If you do not hear from their office within 5-7 days please call back to our office so I can check on this for you.   Please see your appointment listed below.    Inguinal Hernia, Adult An inguinal hernia is when fat or your intestines push through a weak spot in a muscle where your leg meets your lower belly (groin). This causes a rounded lump (bulge). This kind of hernia could also be:  In your scrotum, if you are male.  In folds of skin around your vagina, if you are male. There are three types of inguinal hernias. These include:  Hernias that can be pushed back into the belly (are reducible). This type rarely causes pain.  Hernias that cannot be pushed back into the belly (are incarcerated).  Hernias that cannot be pushed back into the belly and lose their blood supply (are strangulated). This type needs emergency surgery. If you do not have symptoms, you may not need treatment. If you have symptoms or a large hernia, you may need surgery. Follow these instructions at home: Lifestyle  Do these things if told by your doctor so you do not have trouble pooping (constipation): ? Drink enough fluid to keep your pee (urine) pale yellow. ? Eat foods that have a lot of fiber. These include fresh fruits and vegetables, whole grains, and beans. ? Limit foods that are high in fat and processed sugars. These include foods that are fried or sweet. ? Take medicine for trouble pooping.  Avoid lifting heavy objects.  Avoid standing for long amounts of time.  Do not use any products that contain nicotine or tobacco. These include cigarettes and e-cigarettes. If you need help quitting, ask your doctor.  Stay at a healthy weight. General instructions  You may try to push your hernia in by very gently pressing on it when you are lying down. Do not try to force the bulge back in if it  will not push in easily.  Watch your hernia for any changes in shape, size, or color. Tell your doctor if you see any changes.  Take over-the-counter and prescription medicines only as told by your doctor.  Keep all follow-up visits as told by your doctor. This is important. Contact a doctor if:  You have a fever.  You have new symptoms.  Your symptoms get worse. Get help right away if:  The area where your leg meets your lower belly has: ? Pain that gets worse suddenly. ? A bulge that gets bigger suddenly, and it does not get smaller after that. ? A bulge that turns red or purple. ? A bulge that is painful when you touch it.  You are a man, and your scrotum: ? Suddenly feels painful. ? Suddenly changes in size.  You cannot push the hernia in by very gently pressing on it when you are lying down. Do not try to force the bulge back in if it will not push in easily.  You feel sick to your stomach (nauseous), and that feeling does not go away.  You throw up (vomit), and that keeps happening.  You have a fast heartbeat.  You cannot poop (have a bowel movement) or pass gas. These symptoms may be an emergency. Do not wait to see if the symptoms will go away. Get medical help right away. Call your local emergency services (911 in the [place] Summary  An inguinal hernia is when fat or your intestines push through a weak spot in a muscle where your leg meets your lower belly (groin). This causes a rounded lump (bulge).  If you do not have symptoms, you may not need treatment. If you have symptoms or a large hernia, you may need surgery.  Avoid lifting heavy objects. Also avoid standing for long amounts of time.  Do not try to force the bulge back in if it will not push in easily. This information is not intended to replace advice given to you by your health care provider. Make sure you discuss any questions you have with your health care provider. Document Released: 11/19/2006  Document Revised: 11/20/2017 Document Reviewed: 07/21/2017 Elsevier Patient Education  2020 [employer].

## 2019-09-04 ENCOUNTER — Encounter: Payer: Self-pay | Admitting: Surgery

## 2019-09-04 NOTE — Progress Notes (Signed)
Outpatient Surgical Follow Up  09/04/2019  Luke [name] is an 38 y.o. male.   Chief Complaint  Patient presents with  . New Patient (Initial Visit)    Inguinal hernia    HPI: 38 year old male well-known to our practice with a history of right colectomy performed by Dr. Rosana Hoes last year and now comes in for a new right inguinal hernia.  He reports some intermittent pain that is mild to moderate intensity and sharp in nature.  Worsening with certain Valsalva.  No fevers no chills.  He is well-healed from the colectomy and has no complaints from the abdominal perspective.  He has not had a repeat colonoscopy and reports some intermittent melena.  I personally reviewed the CT scan from last year showing evidence of large right colon mass as well as inguinal hernia on the right side.  He denies any prior inguinal surgery operations.  He is able to perform more than 4 METS of activity without any shortness or chest pain.  He does have a CBC that is completely normal.  Past Medical History:  Diagnosis Date  . Anemia   . Cancer ([place])    colon ca  . Cecum mass   . Colonic neoplasm   . Diverticulosis of large intestine without diverticulitis   . GERD (gastroesophageal reflux disease)   . Hypertension   . Pneumonia 09/07/2016  . Stomach irritation     Past Surgical History:  Procedure Laterality Date  . COLONOSCOPY WITH PROPOFOL N/A 07/28/2018   Procedure: COLONOSCOPY WITH PROPOFOL;  Surgeon: Virgel Manifold, MD;  Location: [facility] ENDOSCOPY;  Service: Endoscopy;  Laterality: N/A;  . ESOPHAGOGASTRODUODENOSCOPY (EGD) WITH PROPOFOL N/A 07/28/2018   Procedure: ESOPHAGOGASTRODUODENOSCOPY (EGD) WITH PROPOFOL;  Surgeon: Virgel Manifold, MD;  Location: [facility] ENDOSCOPY;  Service: Endoscopy;  Laterality: N/A;  . none    . PARTIAL COLECTOMY Right 08/15/2018   Procedure: RIGHT  COLECTOMY;  Surgeon: Vickie Epley, MD;  Location: [facility] ORS;  Service: General;  Laterality: Right;    Family  History  Problem Relation Age of Onset  . CAD Maternal Grandmother   . Breast cancer Maternal Grandmother   . Breast cancer Mother   . Hypertension Mother   . Hypertension Father   . Colon cancer Paternal Uncle     Social History:  reports that he has never smoked. He has never used smokeless tobacco. He reports current alcohol use of about 1.0 standard drinks of alcohol per week. He reports previous drug use. Drug: Marijuana.  Allergies: No Known Allergies  Medications reviewed.    ROS Full ROS performed and is otherwise negative other than what is stated in HPI   BP (!) 138/91   Pulse 83   Temp 97.9 F (36.6 C) (Temporal)   Resp 14   Ht 5\' 8"  (1.727 m)   Wt 199 lb 6.4 oz (90.4 kg)   SpO2 98%   BMI 30.32 kg/m   Physical Exam Vitals signs and nursing note reviewed. Exam conducted with a chaperone present.  Constitutional:      General: He is not in acute distress.    Appearance: Normal appearance. He is normal weight.  Eyes:     General: No scleral icterus.       Right eye: No discharge.        Left eye: No discharge.  Neck:     Musculoskeletal: Normal range of motion. No neck rigidity or muscular tenderness.  Cardiovascular:     Rate and Rhythm:  Normal rate and regular rhythm.  Pulmonary:     Effort: Pulmonary effort is normal. No respiratory distress.     Breath sounds: Normal breath sounds. No stridor. No wheezing.  Abdominal:     General: Abdomen is flat. There is no distension.     Palpations: There is no mass.     Tenderness: There is no abdominal tenderness. There is no guarding or rebound.     Hernia: A hernia is present.     Comments: Reducible right inguinal hernia , mildly tender to palpation.  No peritonitis.  Musculoskeletal: Normal range of motion.  Lymphadenopathy:     Cervical: No cervical adenopathy.  Skin:    General: Skin is warm and dry.     Capillary Refill: Capillary refill takes less than 2 seconds.  Neurological:     General: No  focal deficit present.     Mental Status: He is alert and oriented to person, place, and time.  Psychiatric:        Mood and Affect: Mood normal.        Behavior: Behavior normal.        Thought Content: Thought content normal.        Judgment: Judgment normal.       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  He also has a right inguinal hernia that is symptomatic.  Given the prior history of villous adenoma and intermittent melena I do think that it might be worth performing a colonoscopy again to make sure there is no other polyps that may be causing the lower GI bleed.  From an inguinal hernia perspective I do think that he will benefit from robotic inguinal hernia repair with mesh.  Prior to that I want to make sure that we are not missing anything within his colon.  We will send him back to GI and will see him back after he completes a colonoscopy.   Greater than 50% of the 40 minutes  visit was spent in counseling/coordination of care   Caroleen Hamman, MD [facility]

## 2019-09-07 ENCOUNTER — Telehealth: Payer: Self-pay | Admitting: Gastroenterology

## 2019-09-07 NOTE — Telephone Encounter (Signed)
Pt left vm returning someone's call

## 2019-09-08 ENCOUNTER — Other Ambulatory Visit: Payer: Self-pay

## 2019-09-08 ENCOUNTER — Encounter: Payer: Self-pay | Admitting: Oncology

## 2019-09-08 NOTE — Progress Notes (Signed)
Patient pre screened for office appointment, no questions or concerns today. 

## 2019-09-11 ENCOUNTER — Other Ambulatory Visit: Payer: Self-pay

## 2019-09-11 ENCOUNTER — Inpatient Hospital Stay: Payer: Self-pay | Admitting: Oncology

## 2019-09-11 ENCOUNTER — Telehealth: Payer: Self-pay | Admitting: Oncology

## 2019-09-11 ENCOUNTER — Inpatient Hospital Stay: Payer: Self-pay

## 2019-09-11 DIAGNOSIS — D509 Iron deficiency anemia, unspecified: Secondary | ICD-10-CM

## 2019-09-11 NOTE — Telephone Encounter (Signed)
LM to try to R/S NS appt.

## 2019-09-12 ENCOUNTER — Encounter: Payer: Self-pay | Admitting: Oncology

## 2019-09-12 ENCOUNTER — Inpatient Hospital Stay: Payer: Self-pay | Attending: Oncology

## 2019-09-12 ENCOUNTER — Other Ambulatory Visit: Payer: Self-pay

## 2019-09-12 ENCOUNTER — Inpatient Hospital Stay (HOSPITAL_BASED_OUTPATIENT_CLINIC_OR_DEPARTMENT_OTHER): Payer: Self-pay | Admitting: Oncology

## 2019-09-12 VITALS — BP 132/92 | HR 80 | Temp 98.3°F | Resp 16 | Wt 199.8 lb

## 2019-09-12 DIAGNOSIS — D374 Neoplasm of uncertain behavior of colon: Secondary | ICD-10-CM | POA: Insufficient documentation

## 2019-09-12 DIAGNOSIS — D509 Iron deficiency anemia, unspecified: Secondary | ICD-10-CM | POA: Insufficient documentation

## 2019-09-12 DIAGNOSIS — Z720 Tobacco use: Secondary | ICD-10-CM | POA: Insufficient documentation

## 2019-09-12 DIAGNOSIS — Z79899 Other long term (current) drug therapy: Secondary | ICD-10-CM | POA: Insufficient documentation

## 2019-09-12 DIAGNOSIS — K2270 Barrett's esophagus without dysplasia: Secondary | ICD-10-CM | POA: Insufficient documentation

## 2019-09-12 DIAGNOSIS — D120 Benign neoplasm of cecum: Secondary | ICD-10-CM

## 2019-09-12 DIAGNOSIS — E538 Deficiency of other specified B group vitamins: Secondary | ICD-10-CM

## 2019-09-12 DIAGNOSIS — N189 Chronic kidney disease, unspecified: Secondary | ICD-10-CM | POA: Insufficient documentation

## 2019-09-12 LAB — CBC WITH DIFFERENTIAL/PLATELET
Abs Immature Granulocytes: 0.03 10*3/uL (ref 0.00–0.07)
Basophils Absolute: 0.1 10*3/uL (ref 0.0–0.1)
Basophils Relative: 1 %
Eosinophils Absolute: 0.1 10*3/uL (ref 0.0–0.5)
Eosinophils Relative: 2 %
HCT: 46.7 % (ref 39.0–52.0)
Hemoglobin: 15.2 g/dL (ref 13.0–17.0)
Immature Granulocytes: 0 %
Lymphocytes Relative: 37 %
Lymphs Abs: 2.6 10*3/uL (ref 0.7–4.0)
MCH: 28.9 pg (ref 26.0–34.0)
MCHC: 32.5 g/dL (ref 30.0–36.0)
MCV: 88.8 fL (ref 80.0–100.0)
Monocytes Absolute: 0.5 10*3/uL (ref 0.1–1.0)
Monocytes Relative: 7 %
Neutro Abs: 3.7 10*3/uL (ref 1.7–7.7)
Neutrophils Relative %: 53 %
Platelets: 177 10*3/uL (ref 150–400)
RBC: 5.26 MIL/uL (ref 4.22–5.81)
RDW: 13.3 % (ref 11.5–15.5)
WBC: 6.9 10*3/uL (ref 4.0–10.5)
nRBC: 0 % (ref 0.0–0.2)

## 2019-09-12 NOTE — Progress Notes (Signed)
Hematology/Oncology Follow Up Note [facility]  [phone] [phone]  Patient Care Team: Glendon Axe, MD as PCP - General (Internal Medicine) Vickie Epley, MD as Consulting Physician (General Surgery) Virgel Manifold, MD as Consulting Physician (Gastroenterology)   Name of the patient: Luke [name]  [id number]  [birth year]   REASON FOR VISIT  follow-up of anemia.  PERTINENT ONCOLOGY HISTORY Luke [name] is a 38 y.o. male who has above history reviewed by me today presents for follow up visit for management of severe iron deficiency anemia Problems and complaints are listed below:  Patient previously follows up with Dr. Mike Gip and is switched his care to me on 03/10/2019. Extensive past medical records, images, pathology reports reviewed was performed.  07/28/2018 EGD reviewed esophageal mucosal changes suspicious for short segment Barrett's esophagus. Erythematous mucosa in the antrum, gastric polyps, small bowel biopsy was negative.  There was mild reactive gastropathy with minimal chronic inactive inflammation.  GE junction reviewed squamous: GE mucosa reviewed squamocolumnar mucosa with chronic inflammation. 07/28/2018 colonoscopy reviewed 20 cm length villous in obstructing large mass in the cecum.  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  There was no high-grade dysplasia or malignancy.  CEA was 2.8 on 08/04/2018 and a 2.7 on 08/12/2018. Patient underwent a right colectomy on 08/15/2018 which revealed a 4.8 cm pedunculated villous adenoma in the cecum associated with [place] black tattoo.  There was a 1.2 cm pedunculated adenoma in the ascending colon.  There was an incidental hyperplastic polyp in the appendix.  There were 20 regional lymph nodes without pathological changes.  Patient has chronic B12 deficiency.  Intrinsic factor and antiparietal cell antibodies were negative.  Patient is taking oral B12  supplements.  Patient has chronic renal insufficiency.  Patient is taking oral iron supplementation for iron deficiency.  Reports being compliant with iron supplementation.   Reports feeling very fatigued and tired which is getting worse. Denies hematochezia, hematuria, hematemesis, epistaxis, black tarry stool or easy bruising.    INTERVAL HISTORY 38 y.o. male with history of iron deficiency anemia, B12 deficiency, renal insufficiency, cecum villous adenoma presents for follow-up. Patient reports feeling well.  He takes oral iron supplementation once a day, have some side effects including constipation.  He also takes vitamin B-12 supplementation. Denies any fatigue, shortness of breath, chest pain, abdominal pain, blood in the stool. He recently was seen by surgery for discussion of hernia repair. Patient has an upcoming appointment with gastroenterology for repeat colonoscopy.     Review of Systems  Constitutional: Negative for appetite change, chills, fatigue, fever and unexpected weight change.  HENT:   Negative for hearing loss and voice change.   Eyes: Negative for eye problems and icterus.  Respiratory: Negative for chest tightness, cough and shortness of breath.   Cardiovascular: Negative for chest pain and leg swelling.  Gastrointestinal: Negative for abdominal distention and abdominal pain.  Endocrine: Negative for hot flashes.  Genitourinary: Negative for difficulty urinating, dysuria and frequency.   Musculoskeletal: Negative for arthralgias.  Skin: Negative for itching and rash.  Neurological: Negative for light-headedness and numbness.  Hematological: Negative for adenopathy. Does not bruise/bleed easily.  Psychiatric/Behavioral: Negative for confusion.      No Known Allergies   Past Medical History:  Diagnosis Date  . Anemia   . Cancer ([place])    colon ca  . Cecum mass   . Colonic neoplasm   . Diverticulosis of large intestine without diverticulitis   .  GERD (gastroesophageal reflux disease)   . Hypertension   . Pneumonia 09/07/2016  . Stomach irritation      Past Surgical History:  Procedure Laterality Date  . COLONOSCOPY WITH PROPOFOL N/A 07/28/2018   Procedure: COLONOSCOPY WITH PROPOFOL;  Surgeon: Virgel Manifold, MD;  Location: [facility] ENDOSCOPY;  Service: Endoscopy;  Laterality: N/A;  . ESOPHAGOGASTRODUODENOSCOPY (EGD) WITH PROPOFOL N/A 07/28/2018   Procedure: ESOPHAGOGASTRODUODENOSCOPY (EGD) WITH PROPOFOL;  Surgeon: Virgel Manifold, MD;  Location: [facility] ENDOSCOPY;  Service: Endoscopy;  Laterality: N/A;  . none    . PARTIAL COLECTOMY Right 08/15/2018   Procedure: RIGHT  COLECTOMY;  Surgeon: Vickie Epley, MD;  Location: [facility] ORS;  Service: General;  Laterality: Right;    Social History   Socioeconomic History  . Marital status: Single    Spouse name: Not on file  . Number of children: Not on file  . Years of education: Not on file  . Highest education level: Not on file  Occupational History  . Not on file  Social Needs  . Financial resource strain: Not on file  . Food insecurity    Worry: Not on file    Inability: Not on file  . Transportation needs    Medical: Not on file    Non-medical: Not on file  Tobacco Use  . Smoking status: Never Smoker  . Smokeless tobacco: Never Used  . Tobacco comment: patient is social smoker/ smokes when he drinks   Substance and Sexual Activity  . Alcohol use: Yes    Alcohol/week: 1.0 standard drinks    Types: 1 Cans of beer per week    Comment: patient reports he consumes ~2 beers about once per month  . Drug use: Not Currently    Types: Marijuana  . Sexual activity: Yes    Birth control/protection: Condom  Lifestyle  . Physical activity    Days per week: Not on file    Minutes per session: Not on file  . Stress: Not on file  Relationships  . Social Herbalist on phone: Not on file    Gets together: Not on file    Attends religious service: Not on file     Active member of club or organization: Not on file    Attends meetings of clubs or organizations: Not on file    Relationship status: Not on file  . Intimate partner violence    Fear of current or ex partner: Not on file    Emotionally abused: Not on file    Physically abused: Not on file    Forced sexual activity: Not on file  Other Topics Concern  . Not on file  Social History Narrative  . Not on file    Family History  Problem Relation Age of Onset  . CAD Maternal Grandmother   . Breast cancer Maternal Grandmother   . Breast cancer Mother   . Hypertension Mother   . Hypertension Father   . Colon cancer Paternal Uncle      Current Outpatient Medications:  .  amLODipine (NORVASC) 5 MG tablet, Take 1 tablet (5 mg total) by mouth daily., Disp: 30 tablet, Rfl: 3 .  Cyanocobalamin (VITAMIN B-12 PO), Take 1 tablet by mouth daily., Disp: , Rfl:  .  IRON PO, Take 1 tablet by mouth daily., Disp: , Rfl:  .  polyethylene glycol (MIRALAX) 17 g packet, Take 17 g by mouth daily., Disp: 14 each, Rfl: 0  Physical exam:  Vitals:  09/12/19 1409 09/12/19 1413  BP: 122/80 (!) 132/92  Pulse: 72 80  Resp:  16  Temp:  98.3 F (36.8 C)  TempSrc:  Temporal  SpO2:  100%  Weight:  199 lb 12.8 oz (90.6 kg)   Physical Exam Constitutional:      General: He is not in acute distress. HENT:     Head: Normocephalic and atraumatic.  Eyes:     General: No scleral icterus.    Pupils: Pupils are equal, round, and reactive to light.  Neck:     Musculoskeletal: Normal range of motion and neck supple.  Cardiovascular:     Rate and Rhythm: Normal rate and regular rhythm.     Heart sounds: Normal heart sounds.  Pulmonary:     Effort: Pulmonary effort is normal. No respiratory distress.     Breath sounds: No wheezing.  Abdominal:     General: Bowel sounds are normal. There is no distension.     Palpations: Abdomen is soft. There is no mass.     Tenderness: There is no abdominal tenderness.   Musculoskeletal: Normal range of motion.        General: No deformity.  Skin:    General: Skin is warm and dry.     Findings: No erythema or rash.  Neurological:     Mental Status: He is alert and oriented to person, place, and time.     Cranial Nerves: No cranial nerve deficit.     Coordination: Coordination normal.  Psychiatric:        Behavior: Behavior normal.        Thought Content: Thought content normal.     CMP Latest Ref Rng & Units 05/11/2019  Glucose 70 - 99 mg/dL 106(H)  BUN 6 - 20 mg/dL 9  Creatinine 0.61 - 1.24 mg/dL 1.17  Sodium 135 - 145 mmol/L 139  Potassium 3.5 - 5.1 mmol/L 3.8  Chloride 98 - 111 mmol/L 106  CO2 22 - 32 mmol/L 22  Calcium 8.9 - 10.3 mg/dL 8.9  Total Protein 6.5 - 8.1 g/dL 7.0  Total Bilirubin 0.3 - 1.2 mg/dL 0.6  Alkaline Phos 38 - 126 U/L 56  AST 15 - 41 U/L 34  ALT 0 - 44 U/L 39   CBC Latest Ref Rng & Units 09/12/2019  WBC 4.0 - 10.5 K/uL 6.9  Hemoglobin 13.0 - 17.0 g/dL 15.2  Hematocrit 39.0 - 52.0 % 46.7  Platelets 150 - 400 K/uL 177    No results found.   Assessment and plan  1. Iron deficiency anemia, unspecified iron deficiency anemia type   2. B12 deficiency   3. Adenoma of cecum    #Iron deficiency anemia, Labs are reviewed and discussed with patient. Hemoglobin remains normal. Iron panel indicates stable and adequate iron stores. Additional IV iron treatments. Also advised patient to stop oral iron supplementation.  Continue my .  Continue vitamin B12 supplementation. Barrett's esophagus, recommend patient to follow-up with gastroenterology. Cecum adenoma, see gastroenterology for repeat colonoscopy.  Follow-up in 1 year. Orders Placed This Encounter  Procedures  . CBC with Differential    Standing Status:   Future    Standing Expiration Date:   09/11/2021  . Ferritin    Standing Status:   Future    Standing Expiration Date:   09/11/2021  . Iron and TIBC    Standing Status:   Future    Standing  Expiration Date:   09/11/2021  . Vitamin B12    Standing Status:  Future    Standing Expiration Date:   09/11/2021       Earlie Server, MD, PhD Hematology Oncology [facility] Pager- [phone] 09/12/2019

## 2019-09-12 NOTE — Progress Notes (Signed)
Patient is feeling fatigued. 

## 2019-09-14 ENCOUNTER — Telehealth: Payer: Self-pay

## 2019-09-14 ENCOUNTER — Other Ambulatory Visit: Payer: Self-pay

## 2019-09-14 ENCOUNTER — Ambulatory Visit: Payer: Self-pay | Admitting: Gastroenterology

## 2019-09-14 DIAGNOSIS — D374 Neoplasm of uncertain behavior of colon: Secondary | ICD-10-CM

## 2019-09-14 NOTE — Telephone Encounter (Signed)
Gastroenterology Pre-Procedure Review  Request Date: Monday 10/16/19 Requesting Physician: Dr. Bonna Gains  PATIENT REVIEW QUESTIONS: The patient responded to the following health history questions as indicated:    1. Are you having any GI issues? no 2. Do you have a personal history of Polyps? yes (Villous Adenoma on right colon (Referral Note)) 3. Do you have a family history of Colon Cancer or Polyps? yes (father colon polyps) 4. Diabetes Mellitus? no 5. Joint replacements in the past 12 months?no 6. Major health problems in the past 3 months?no 7. Any artificial heart valves, MVP, or defibrillator?no    MEDICATIONS & ALLERGIES:    Patient reports the following regarding taking any anticoagulation/antiplatelet therapy:   Plavix, Coumadin, Eliquis, Xarelto, Lovenox, Pradaxa, Brilinta, or Effient? no Aspirin? no  Patient confirms/reports the following medications:  Current Outpatient Medications  Medication Sig Dispense Refill  . amLODipine (NORVASC) 5 MG tablet Take 1 tablet (5 mg total) by mouth daily. 30 tablet 3  . Cyanocobalamin (VITAMIN B-12 PO) Take 1 tablet by mouth daily.    . IRON PO Take 1 tablet by mouth daily.    . polyethylene glycol (MIRALAX) 17 g packet Take 17 g by mouth daily. 14 each 0   No current facility-administered medications for this visit.     Patient confirms/reports the following allergies:  No Known Allergies  No orders of the defined types were placed in this encounter.   AUTHORIZATION INFORMATION Primary Insurance: 1D#: Group #:  Secondary Insurance: 1D#: Group #:  SCHEDULE INFORMATION: Date: 10/16/19 Time: Location:[facility]

## 2019-09-27 ENCOUNTER — Ambulatory Visit: Payer: Self-pay | Admitting: Surgery

## 2019-10-03 ENCOUNTER — Ambulatory Visit: Payer: Self-pay | Admitting: Gastroenterology

## 2019-10-12 ENCOUNTER — Telehealth: Payer: Self-pay

## 2019-10-12 ENCOUNTER — Other Ambulatory Visit: Admission: RE | Admit: 2019-10-12 | Payer: Self-pay | Source: Ambulatory Visit

## 2019-10-12 NOTE — Telephone Encounter (Signed)
Patient called clinic at 12:08pm. Returned patients call at 4:05pm, no answer, no voicemail.

## 2019-10-16 ENCOUNTER — Other Ambulatory Visit
Admission: RE | Admit: 2019-10-16 | Discharge: 2019-10-16 | Disposition: A | Discharge status: Home / Self Care | Payer: HRSA Program | Source: Ambulatory Visit | Attending: Gastroenterology | Admitting: Gastroenterology

## 2019-10-16 ENCOUNTER — Other Ambulatory Visit: Payer: Self-pay

## 2019-10-16 DIAGNOSIS — Z01812 Encounter for preprocedural laboratory examination: Secondary | ICD-10-CM | POA: Diagnosis not present

## 2019-10-16 DIAGNOSIS — Z20828 Contact with and (suspected) exposure to other viral communicable diseases: Secondary | ICD-10-CM | POA: Diagnosis present

## 2019-10-16 LAB — SARS CORONAVIRUS 2 (TAT 6-24 HRS): SARS Coronavirus 2: NEGATIVE

## 2019-10-17 ENCOUNTER — Ambulatory Visit: Payer: Self-pay | Admitting: Gerontology

## 2019-10-17 VITALS — BP 136/94 | Wt 200.0 lb

## 2019-10-17 DIAGNOSIS — I10 Essential (primary) hypertension: Secondary | ICD-10-CM

## 2019-10-17 MED ORDER — AMLODIPINE BESYLATE 5 MG PO TABS: 5.0000 mg | ORAL_TABLET | Freq: Every day | ORAL | 1 refills | Status: DC

## 2019-10-17 NOTE — Progress Notes (Signed)
Established Patient Office Visit  Subjective:  Patient ID: Luke [name], male    DOB: 26-Sep-1981  Age: 38 y.o. MRN: [id number]  CC:  Chief Complaint  Patient presents with  . Hypertension    HPI Luke [name] presents for follow up of hypertension. He states that he's compliant with his medications,checks his blood pressure at home and adheres to DASH diet. He denies chest pain, palpitation, light headedness and peripheral edema. He followed up with General Surgery Dr Dahlia Byes D.F on 08/30/2019 for Villous adenoma of right colon, status post right colectomy,and right inguinal hernia. He will follow up with Gastroenterology for Colonoscopy and then robotic inguinal hernia repair with mesh. He was seen by the Oncologist Dr Earlie Server on 09/12/2019 for Iron deficiency anemia, and he was advised to continue taking Vitamin B 12 supplement. He states that he's doing well and offers no further complaint.  Past Medical History:  Diagnosis Date  . Anemia   . Cancer ([place])    colon ca  . Cecum mass   . Colonic neoplasm   . Diverticulosis of large intestine without diverticulitis   . GERD (gastroesophageal reflux disease)   . Hypertension   . Pneumonia 09/07/2016  . Stomach irritation     Past Surgical History:  Procedure Laterality Date  . COLONOSCOPY WITH PROPOFOL N/A 07/28/2018   Procedure: COLONOSCOPY WITH PROPOFOL;  Surgeon: Virgel Manifold, MD;  Location: [facility] ENDOSCOPY;  Service: Endoscopy;  Laterality: N/A;  . ESOPHAGOGASTRODUODENOSCOPY (EGD) WITH PROPOFOL N/A 07/28/2018   Procedure: ESOPHAGOGASTRODUODENOSCOPY (EGD) WITH PROPOFOL;  Surgeon: Virgel Manifold, MD;  Location: [facility] ENDOSCOPY;  Service: Endoscopy;  Laterality: N/A;  . none    . PARTIAL COLECTOMY Right 08/15/2018   Procedure: RIGHT  COLECTOMY;  Surgeon: Vickie Epley, MD;  Location: [facility] ORS;  Service: General;  Laterality: Right;    Family History  Problem Relation Age of Onset  . CAD Maternal Grandmother    . Breast cancer Maternal Grandmother   . Breast cancer Mother   . Hypertension Mother   . Hypertension Father   . Colon cancer Paternal Uncle     Social History   Socioeconomic History  . Marital status: Single    Spouse name: Not on file  . Number of children: Not on file  . Years of education: Not on file  . Highest education level: Not on file  Occupational History  . Not on file  Tobacco Use  . Smoking status: Never Smoker  . Smokeless tobacco: Never Used  . Tobacco comment: patient is social smoker/ smokes when he drinks   Substance and Sexual Activity  . Alcohol use: Yes    Alcohol/week: 1.0 standard drinks    Types: 1 Cans of beer per week    Comment: patient reports he consumes ~2 beers about once per month  . Drug use: Not Currently    Types: Marijuana  . Sexual activity: Yes    Birth control/protection: Condom  Other Topics Concern  . Not on file  Social History Narrative  . Not on file   Social Determinants of Health   Financial Resource Strain:   . Difficulty of Paying Living Expenses: Not on file  Food Insecurity:   . Worried About Charity fundraiser in the Last Year: Not on file  . Ran Out of Food in the Last Year: Not on file  Transportation Needs:   . Lack of Transportation (Medical): Not on file  . Lack of Transportation (  Non-Medical): Not on file  Physical Activity:   . Days of Exercise per Week: Not on file  . Minutes of Exercise per Session: Not on file  Stress:   . Feeling of Stress : Not on file  Social Connections:   . Frequency of Communication with Friends and Family: Not on file  . Frequency of Social Gatherings with Friends and Family: Not on file  . Attends Religious Services: Not on file  . Active Member of Clubs or Organizations: Not on file  . Attends Archivist Meetings: Not on file  . Marital Status: Not on file  Intimate Partner Violence:   . Fear of Current or Ex-Partner: Not on file  . Emotionally Abused: Not  on file  . Physically Abused: Not on file  . Sexually Abused: Not on file    Outpatient Medications Prior to Visit  Medication Sig Dispense Refill  . Cyanocobalamin (VITAMIN B-12 PO) Take 1 tablet by mouth daily.    . IRON PO Take 1 tablet by mouth daily.    Marland Kitchen amLODipine (NORVASC) 5 MG tablet Take 1 tablet (5 mg total) by mouth daily. 30 tablet 3  . polyethylene glycol (MIRALAX) 17 g packet Take 17 g by mouth daily. (Patient not taking: Reported on 10/17/2019) 14 each 0   No facility-administered medications prior to visit.    No Known Allergies  ROS Review of Systems  Constitutional: Negative.   Respiratory: Negative.   Cardiovascular: Negative.   Gastrointestinal: Negative.   Neurological: Negative.   Psychiatric/Behavioral: Negative.       Objective:    Physical Exam  Constitutional: He is oriented to person, place, and time. He appears well-developed.  HENT:  Head: Normocephalic and atraumatic.  Eyes: Pupils are equal, round, and reactive to light. EOM are normal.  Cardiovascular: Normal rate and regular rhythm.  Pulmonary/Chest: Effort normal and breath sounds normal.  Neurological: He is alert and oriented to person, place, and time.  Psychiatric: He has a normal mood and affect. His behavior is normal. Judgment and thought content normal.    BP (!) 136/94 (BP Location: Right Arm, Patient Position: Sitting)   Wt 200 lb (90.7 kg)   BMI 30.41 kg/m  Wt Readings from Last 3 Encounters:  10/17/19 200 lb (90.7 kg)  09/12/19 199 lb 12.8 oz (90.6 kg)  08/30/19 199 lb 6.4 oz (90.4 kg)   He was encouraged to continue on his weight loss regimen.  Health Maintenance Due  Topic Date Due  . TETANUS/TDAP  12/27/1998    There are no preventive care reminders to display for this patient.  Lab Results  Component Value Date   TSH 0.622 07/04/2018   Lab Results  Component Value Date   WBC 6.9 09/12/2019   HGB 15.2 09/12/2019   HCT 46.7 09/12/2019   MCV 88.8  09/12/2019   PLT 177 09/12/2019   Lab Results  Component Value Date   NA 139 05/11/2019   K 3.8 05/11/2019   CO2 22 05/11/2019   GLUCOSE 106 (H) 05/11/2019   BUN 9 05/11/2019   CREATININE 1.17 05/11/2019   BILITOT 0.6 05/11/2019   ALKPHOS 56 05/11/2019   AST 34 05/11/2019   ALT 39 05/11/2019   PROT 7.0 05/11/2019   ALBUMIN 3.8 05/11/2019   CALCIUM 8.9 05/11/2019   ANIONGAP 11 05/11/2019   No results found for: CHOL No results found for: HDL No results found for: LDLCALC No results found for: TRIG No results found for: CHOLHDL  Lab Results  Component Value Date   HGBA1C 5.8 (H) 09/07/2016      Assessment & Plan:   1. Essential hypertension - His blood pressure is under control, he will continue on current treatment regimen. -Low salt DASH diet -Take medications regularly on time -Exercise regularly as tolerated -Check blood pressure at least once a week at home or a nearby pharmacy,record and bring log to clinic. -Goal is less than 140/90 and normal blood pressure is less than 120/80 - amLODipine (NORVASC) 5 MG tablet; Take 1 tablet (5 mg total) by mouth daily.  Dispense: 90 tablet; Refill: 1 - Lipid panel; Future - HgB A1c; Future     Follow-up: Return in about 5 weeks (around 11/23/2019), or if symptoms worsen or fail to improve.    Eyvonne Burchfield Jerold Coombe, NP

## 2019-10-17 NOTE — Patient Instructions (Signed)

## 2019-10-18 ENCOUNTER — Ambulatory Visit: Payer: Self-pay | Admitting: Anesthesiology

## 2019-10-18 ENCOUNTER — Encounter: Payer: Self-pay | Admitting: Gastroenterology

## 2019-10-18 ENCOUNTER — Ambulatory Visit
Admission: RE | Admit: 2019-10-18 | Discharge: 2019-10-18 | Disposition: A | Discharge status: Home / Self Care | Payer: Self-pay | Attending: Gastroenterology | Admitting: Gastroenterology

## 2019-10-18 ENCOUNTER — Encounter
Admission: RE | Disposition: A | Discharge status: Home / Self Care | Payer: Self-pay | Source: Home / Self Care | Attending: Gastroenterology

## 2019-10-18 ENCOUNTER — Other Ambulatory Visit: Payer: Self-pay

## 2019-10-18 DIAGNOSIS — Z79899 Other long term (current) drug therapy: Secondary | ICD-10-CM | POA: Insufficient documentation

## 2019-10-18 DIAGNOSIS — Z980 Intestinal bypass and anastomosis status: Secondary | ICD-10-CM | POA: Insufficient documentation

## 2019-10-18 DIAGNOSIS — Z8249 Family history of ischemic heart disease and other diseases of the circulatory system: Secondary | ICD-10-CM | POA: Insufficient documentation

## 2019-10-18 DIAGNOSIS — Z803 Family history of malignant neoplasm of breast: Secondary | ICD-10-CM | POA: Insufficient documentation

## 2019-10-18 DIAGNOSIS — Z800 Family history of malignant neoplasm of digestive organs: Secondary | ICD-10-CM | POA: Insufficient documentation

## 2019-10-18 DIAGNOSIS — D374 Neoplasm of uncertain behavior of colon: Secondary | ICD-10-CM

## 2019-10-18 DIAGNOSIS — Z86010 Personal history of colonic polyps: Secondary | ICD-10-CM | POA: Insufficient documentation

## 2019-10-18 DIAGNOSIS — Z1211 Encounter for screening for malignant neoplasm of colon: Secondary | ICD-10-CM | POA: Insufficient documentation

## 2019-10-18 DIAGNOSIS — D122 Benign neoplasm of ascending colon: Secondary | ICD-10-CM | POA: Insufficient documentation

## 2019-10-18 DIAGNOSIS — K635 Polyp of colon: Secondary | ICD-10-CM

## 2019-10-18 DIAGNOSIS — I10 Essential (primary) hypertension: Secondary | ICD-10-CM | POA: Insufficient documentation

## 2019-10-18 DIAGNOSIS — K219 Gastro-esophageal reflux disease without esophagitis: Secondary | ICD-10-CM | POA: Insufficient documentation

## 2019-10-18 DIAGNOSIS — Z85038 Personal history of other malignant neoplasm of large intestine: Secondary | ICD-10-CM | POA: Insufficient documentation

## 2019-10-18 HISTORY — PX: COLONOSCOPY WITH PROPOFOL: SHX5780

## 2019-10-18 SURGERY — COLONOSCOPY WITH PROPOFOL
Anesthesia: General

## 2019-10-18 MED ORDER — LIDOCAINE 2% (20 MG/ML) 5 ML SYRINGE
INTRAMUSCULAR | Status: DC | PRN
  Administered 2019-10-18: 100 mg via INTRAVENOUS

## 2019-10-18 MED ORDER — PROPOFOL 10 MG/ML IV BOLUS
INTRAVENOUS | Status: DC | PRN
  Administered 2019-10-18: 90 mg via INTRAVENOUS
  Administered 2019-10-18: 10 mg via INTRAVENOUS

## 2019-10-18 MED ORDER — GLYCOPYRROLATE 0.2 MG/ML IJ SOLN
INTRAMUSCULAR | Status: DC | PRN
  Administered 2019-10-18: .2 mg via INTRAVENOUS

## 2019-10-18 MED ORDER — PROPOFOL 500 MG/50ML IV EMUL
INTRAVENOUS | Status: DC | PRN
  Administered 2019-10-18: 150 ug/kg/min via INTRAVENOUS

## 2019-10-18 MED ORDER — PROPOFOL 10 MG/ML IV BOLUS
INTRAVENOUS | Status: AC
  Filled 2019-10-18: qty 20

## 2019-10-18 MED ORDER — SODIUM CHLORIDE 0.9 % IV SOLN: INTRAVENOUS | Status: DC

## 2019-10-18 NOTE — Anesthesia Preprocedure Evaluation (Signed)
Anesthesia Evaluation  Patient identified by MRN, date of birth, ID band Patient awake    Reviewed: Allergy & Precautions, H&P , NPO status , Patient's Chart, lab work & pertinent test results, reviewed documented beta blocker date and time   Airway Mallampati: II   Neck ROM: full    Dental  (+) Poor Dentition   Pulmonary pneumonia, resolved,    Pulmonary exam normal        Cardiovascular Exercise Tolerance: Good hypertension, On Medications negative cardio ROS Normal cardiovascular exam Rhythm:regular Rate:Normal     Neuro/Psych negative neurological ROS  negative psych ROS   GI/Hepatic Neg liver ROS, GERD  Medicated,  Endo/Other  negative endocrine ROS  Renal/GU negative Renal ROS  negative genitourinary   Musculoskeletal   Abdominal   Peds  Hematology  (+) Blood dyscrasia, anemia ,   Anesthesia Other Findings Past Medical History: No date: Anemia No date: Cancer ([place])     Comment:  colon ca No date: Cecum mass No date: Colonic neoplasm No date: Diverticulosis of large intestine without diverticulitis No date: GERD (gastroesophageal reflux disease) No date: Hypertension 09/07/2016: Pneumonia No date: Stomach irritation Past Surgical History: No date: COLON SURGERY 07/28/2018: COLONOSCOPY WITH PROPOFOL; N/A     Comment:  Procedure: COLONOSCOPY WITH PROPOFOL;  Surgeon:               Virgel Manifold, MD;  Location: [facility] ENDOSCOPY;                Service: Endoscopy;  Laterality: N/A; 07/28/2018: ESOPHAGOGASTRODUODENOSCOPY (EGD) WITH PROPOFOL; N/A     Comment:  Procedure: ESOPHAGOGASTRODUODENOSCOPY (EGD) WITH               PROPOFOL;  Surgeon: Virgel Manifold, MD;  Location:               [facility] ENDOSCOPY;  Service: Endoscopy;  Laterality: N/A; No date: none 08/15/2018: PARTIAL COLECTOMY; Right     Comment:  Procedure: RIGHT  COLECTOMY;  Surgeon: Vickie Epley, MD;  Location: [facility]  ORS;  Service: General;                Laterality: Right;   Reproductive/Obstetrics negative OB ROS                             Anesthesia Physical Anesthesia Plan  ASA: II  Anesthesia Plan: General   Post-op Pain Management:    Induction:   PONV Risk Score and Plan:   Airway Management Planned:   Additional Equipment:   Intra-op Plan:   Post-operative Plan:   Informed Consent: I have reviewed the patients History and Physical, chart, labs and discussed the procedure including the risks, benefits and alternatives for the proposed anesthesia with the patient or authorized representative who has indicated his/her understanding and acceptance.     Dental Advisory Given  Plan Discussed with: CRNA  Anesthesia Plan Comments:         Anesthesia Quick Evaluation

## 2019-10-18 NOTE — Anesthesia Postprocedure Evaluation (Signed)
Anesthesia Post Note  Patient: Luke [name]  Procedure(s) Performed: COLONOSCOPY WITH PROPOFOL (N/A )  Patient location during evaluation: Endoscopy Anesthesia Type: General Level of consciousness: awake and alert Pain management: pain level controlled Vital Signs Assessment: post-procedure vital signs reviewed and stable Respiratory status: spontaneous breathing, nonlabored ventilation, respiratory function stable and patient connected to nasal cannula oxygen Cardiovascular status: blood pressure returned to baseline and stable Postop Assessment: no apparent nausea or vomiting Anesthetic complications: no     Last Vitals:  Vitals:   10/18/19 1124 10/18/19 1134  BP: 130/83 (!) 138/95  Pulse:  77  Resp: 16 16  Temp:    SpO2: 99% 100%    Last Pain:  Vitals:   10/18/19 1134  TempSrc:   PainSc: 0-No pain                 Martha Clan

## 2019-10-18 NOTE — Anesthesia Post-op Follow-up Note (Signed)
Anesthesia QCDR form completed.        

## 2019-10-18 NOTE — Op Note (Signed)
[facility] Gastroenterology Patient Name: Luke [name] Procedure Date: 10/18/2019 7:23 AM MRN: [id number] Account #: [id number] Date of Birth: 06/04/1981 Admit Type: Outpatient Age: 38 Room: [facility] ENDO ROOM 4 Gender: Male Note Status: Finalized Procedure:             Colonoscopy Indications:           High risk colon cancer surveillance: Personal history                         of colonic polyps Providers:             Shaquitta Burbridge B. Bonna Gains MD, MD Referring MD:          Glendon Axe (Referring MD) Medicines:             Monitored Anesthesia Care Complications:         No immediate complications. Procedure:             Pre-Anesthesia Assessment:                        - ASA Grade Assessment: II - A patient with mild                         systemic disease.                        - Prior to the procedure, a History and Physical was                         performed, and patient medications, allergies and                         sensitivities were reviewed. The patient's tolerance                         of previous anesthesia was reviewed.                        - The risks and benefits of the procedure and the                         sedation options and risks were discussed with the                         patient. All questions were answered and informed                         consent was obtained.                        - Patient identification and proposed procedure were                         verified prior to the procedure by the physician, the                         nurse, the anesthesiologist, the anesthetist and the                         technician. The procedure was verified  in the                         procedure room.                        After obtaining informed consent, the colonoscope was                         passed under direct vision. Throughout the procedure,                         the patient's blood pressure, pulse, and oxygen                        saturations were monitored continuously. The                         Colonoscope was introduced through the anus and                         advanced to the the ileocolonic anastomosis. The                         colonoscopy was performed with ease. The patient                         tolerated the procedure well. The quality of the bowel                         preparation was fair. Findings:      The perianal and digital rectal examinations were normal.      There was evidence of a prior surgical anastomosis in the ascending       colon. This was patent and was characterized by healthy appearing mucosa.      A 5 mm polyp was found in the anastomosis. The polyp was sessile. The       polyp was removed with a jumbo cold forceps. Resection and retrieval       were complete.      A 4 mm polyp was found in the sigmoid colon. The polyp was sessile. The       polyp was removed with a jumbo cold forceps. Resection and retrieval       were complete.      The exam was otherwise without abnormality.      The retroflexed view of the distal rectum and anal verge was normal and       showed no anal or rectal abnormalities. Impression:            - Preparation of the colon was fair.                        - Patent surgical anastomosis, characterized by                         healthy appearing mucosa.                        - One 5 mm polyp at the anastomosis, removed with a  jumbo cold forceps. Resected and retrieved.                        - One 4 mm polyp in the sigmoid colon, removed with a                         jumbo cold forceps. Resected and retrieved.                        - The examination was otherwise normal.                        - The distal rectum and anal verge are normal on                         retroflexion view. Recommendation:        - Discharge patient to home (with escort).                        - Advance diet as tolerated.                         - Continue present medications.                        - Await pathology results.                        - Repeat colonoscopy in 1 year, with 2 day prep.                        - The findings and recommendations were discussed with                         the patient.                        - The findings and recommendations were discussed with                         the patient's family.                        - Return to primary care physician as previously                         scheduled. Procedure Code(s):     --- Professional ---                        [phone], Colonoscopy, flexible; with biopsy, single or                         multiple Diagnosis Code(s):     --- Professional ---                        Z86.010, Personal history of colonic polyps                        Z98.0, Intestinal bypass and anastomosis status  K63.5, Polyp of colon CPT copyright 2019 American Medical Association. All rights reserved. The codes documented in this report are preliminary and upon coder review may  be revised to meet current compliance requirements.  Vonda Antigua, MD Margretta Sidle B. Bonna Gains MD, MD 10/18/2019 11:08:47 AM This report has been signed electronically. Number of Addenda: 0 Note Initiated On: 10/18/2019 7:23 AM Scope Withdrawal Time: 0 hours 21 minutes 58 seconds  Total Procedure Duration: 0 hours 31 minutes 12 seconds  Estimated Blood Loss:  Estimated blood loss: none.      [facility]

## 2019-10-18 NOTE — H&P (Signed)
Vonda Antigua, MD [address] [place], [place], [place], [address] [address], [place], [place], [place], [address] Phone: [phone]  Fax: [phone]  Primary Care Physician:  Langston Reusing, NP   Pre-Procedure History & Physical: HPI:  Luke [name] is a 38 y.o. male is here for a colonoscopy.   Past Medical History:  Diagnosis Date  . Anemia   . Cancer ([place])    colon ca  . Cecum mass   . Colonic neoplasm   . Diverticulosis of large intestine without diverticulitis   . GERD (gastroesophageal reflux disease)   . Hypertension   . Pneumonia 09/07/2016  . Stomach irritation     Past Surgical History:  Procedure Laterality Date  . COLON SURGERY    . COLONOSCOPY WITH PROPOFOL N/A 07/28/2018   Procedure: COLONOSCOPY WITH PROPOFOL;  Surgeon: Virgel Manifold, MD;  Location: [facility] ENDOSCOPY;  Service: Endoscopy;  Laterality: N/A;  . ESOPHAGOGASTRODUODENOSCOPY (EGD) WITH PROPOFOL N/A 07/28/2018   Procedure: ESOPHAGOGASTRODUODENOSCOPY (EGD) WITH PROPOFOL;  Surgeon: Virgel Manifold, MD;  Location: [facility] ENDOSCOPY;  Service: Endoscopy;  Laterality: N/A;  . none    . PARTIAL COLECTOMY Right 08/15/2018   Procedure: RIGHT  COLECTOMY;  Surgeon: Vickie Epley, MD;  Location: [facility] ORS;  Service: General;  Laterality: Right;    Prior to Admission medications   Medication Sig Start Date End Date Taking? Authorizing Provider  amLODipine (NORVASC) 5 MG tablet Take 1 tablet (5 mg total) by mouth daily. 10/17/19  Yes Iloabachie, Chioma E, NP  Cyanocobalamin (VITAMIN B-12 PO) Take 1 tablet by mouth daily.   Yes [provider]  IRON PO Take 1 tablet by mouth daily.   Yes [provider]  polyethylene glycol (MIRALAX) 17 g packet Take 17 g by mouth daily. 08/15/19  Yes Iloabachie, Chioma E, NP    Allergies as of 09/14/2019  . (No Known Allergies)    Family History  Problem Relation Age of Onset  . CAD Maternal Grandmother   . Breast  cancer Maternal Grandmother   . Breast cancer Mother   . Hypertension Mother   . Hypertension Father   . Colon cancer Paternal Uncle     Social History   Socioeconomic History  . Marital status: Single    Spouse name: Not on file  . Number of children: Not on file  . Years of education: Not on file  . Highest education level: Not on file  Occupational History  . Not on file  Tobacco Use  . Smoking status: Never Smoker  . Smokeless tobacco: Never Used  . Tobacco comment: patient is social smoker/ smokes when he drinks   Substance and Sexual Activity  . Alcohol use: Yes    Alcohol/week: 1.0 standard drinks    Types: 1 Cans of beer per week    Comment: patient reports he consumes ~2 beers about once per month  . Drug use: Not Currently    Types: Marijuana  . Sexual activity: Yes    Birth control/protection: Condom  Other Topics Concern  . Not on file  Social History Narrative  . Not on file   Social Determinants of Health   Financial Resource Strain:   . Difficulty of Paying Living Expenses: Not on file  Food Insecurity:   . Worried About Charity fundraiser in the Last Year: Not on file  . Ran Out of Food in the Last Year: Not on file  Transportation Needs:   . Lack  of Transportation (Medical): Not on file  . Lack of Transportation (Non-Medical): Not on file  Physical Activity:   . Days of Exercise per Week: Not on file  . Minutes of Exercise per Session: Not on file  Stress:   . Feeling of Stress : Not on file  Social Connections:   . Frequency of Communication with Friends and Family: Not on file  . Frequency of Social Gatherings with Friends and Family: Not on file  . Attends Religious Services: Not on file  . Active Member of Clubs or Organizations: Not on file  . Attends Archivist Meetings: Not on file  . Marital Status: Not on file  Intimate Partner Violence:   . Fear of Current or Ex-Partner: Not on file  . Emotionally Abused: Not on file  .  Physically Abused: Not on file  . Sexually Abused: Not on file    Review of Systems: See HPI, otherwise negative ROS  Physical Exam: There were no vitals taken for this visit. General:   Alert,  pleasant and cooperative in NAD Head:  Normocephalic and atraumatic. Neck:  Supple; no masses or thyromegaly. Lungs:  Clear throughout to auscultation, normal respiratory effort.    Heart:  +S1, +S2, Regular rate and rhythm, No edema. Abdomen:  Soft, nontender and nondistended. Normal bowel sounds, without guarding, and without rebound.   Neurologic:  Alert and  oriented x4;  grossly normal neurologically.  Impression/Plan: Luke [name] is here for a colonoscopy to be performed for history of polyps.  Risks, benefits, limitations, and alternatives regarding  colonoscopy have been reviewed with the patient.  Questions have been answered.  All parties agreeable.   Virgel Manifold, MD  10/18/2019, 10:05 AM

## 2019-10-18 NOTE — Transfer of Care (Signed)
Immediate Anesthesia Transfer of Care Note  Patient: Luke [name]  Procedure(s) Performed: COLONOSCOPY WITH PROPOFOL (N/A )  Patient Location: Endoscopy Unit  Anesthesia Type:General  Level of Consciousness: awake  Airway & Oxygen Therapy: Patient connected to nasal cannula oxygen  Post-op Assessment: Post -op Vital signs reviewed and stable  Post vital signs: stable  Last Vitals:  Vitals Value Taken Time  BP 106/72 10/18/19 1104  Temp    Pulse 83 10/18/19 1104  Resp 16 10/18/19 1104  SpO2 96 % 10/18/19 1104    Last Pain:  Vitals:   10/18/19 1104  TempSrc:   PainSc: 0-No pain         Complications: No apparent anesthesia complications

## 2019-10-19 ENCOUNTER — Encounter: Payer: Self-pay | Admitting: Gastroenterology

## 2019-10-19 ENCOUNTER — Encounter: Payer: Self-pay | Admitting: *Deleted

## 2019-10-19 LAB — SURGICAL PATHOLOGY

## 2019-10-23 ENCOUNTER — Encounter: Payer: Self-pay | Admitting: Surgery

## 2019-10-23 ENCOUNTER — Other Ambulatory Visit: Payer: Self-pay

## 2019-10-23 ENCOUNTER — Ambulatory Visit (INDEPENDENT_AMBULATORY_CARE_PROVIDER_SITE_OTHER): Payer: Self-pay | Admitting: Surgery

## 2019-10-23 VITALS — BP 129/87 | HR 79 | Temp 97.7°F | Resp 14 | Ht 68.0 in | Wt 199.2 lb

## 2019-10-23 DIAGNOSIS — K4090 Unilateral inguinal hernia, without obstruction or gangrene, not specified as recurrent: Secondary | ICD-10-CM

## 2019-10-23 NOTE — H&P (View-Only) (Signed)
Outpatient Surgical Follow Up  10/23/2019  Luke [name] is an 38 y.o. male.   Chief Complaint  Patient presents with  . Follow-up    Inguinal Hernia    HPI: 38 year old male well-known to our practice with a history of right colectomy performed by Dr. Rosana Hoes last year and now comes in for a new right inguinal hernia.  He reports intermittent pain that is mild to moderate intensity and sharp in nature.  Worsening with    Valsalva.  No fevers no chills.    He did have a repeat colonoscopy that I have personally reviewed showing hyperplastic polyp and serrated polyp no dysplasia. .  I personally reviewed the CT scan from last year showing evidence of large right colon mass as well as inguinal hernia on the right side ( I respectfully disagree with radiologist report , hernia is evident on the right inguinal area).  He denies any prior inguinal surgery operations.  He is able to perform more than 4 METS of activity without any shortness or chest pain  Past Medical History:  Diagnosis Date  . Anemia   . Cancer ([place])    colon ca  . Cecum mass   . Colonic neoplasm   . Diverticulosis of large intestine without diverticulitis   . GERD (gastroesophageal reflux disease)   . Hypertension   . Pneumonia 09/07/2016  . Stomach irritation     Past Surgical History:  Procedure Laterality Date  . COLON SURGERY    . COLONOSCOPY WITH PROPOFOL N/A 07/28/2018   Procedure: COLONOSCOPY WITH PROPOFOL;  Surgeon: Virgel Manifold, MD;  Location: [facility] ENDOSCOPY;  Service: Endoscopy;  Laterality: N/A;  . COLONOSCOPY WITH PROPOFOL N/A 10/18/2019   Procedure: COLONOSCOPY WITH PROPOFOL;  Surgeon: Virgel Manifold, MD;  Location: [facility] ENDOSCOPY;  Service: Endoscopy;  Laterality: N/A;  . ESOPHAGOGASTRODUODENOSCOPY (EGD) WITH PROPOFOL N/A 07/28/2018   Procedure: ESOPHAGOGASTRODUODENOSCOPY (EGD) WITH PROPOFOL;  Surgeon: Virgel Manifold, MD;  Location: [facility] ENDOSCOPY;  Service: Endoscopy;  Laterality: N/A;   . none    . PARTIAL COLECTOMY Right 08/15/2018   Procedure: RIGHT  COLECTOMY;  Surgeon: Vickie Epley, MD;  Location: [facility] ORS;  Service: General;  Laterality: Right;    Family History  Problem Relation Age of Onset  . CAD Maternal Grandmother   . Breast cancer Maternal Grandmother   . Breast cancer Mother   . Hypertension Mother   . Hypertension Father   . Colon cancer Paternal Uncle     Social History:  reports that he has never smoked. He has never used smokeless tobacco. He reports current alcohol use of about 1.0 standard drinks of alcohol per week. He reports previous drug use. Drug: Marijuana.  Allergies: No Known Allergies  Medications reviewed.    ROS Full ROS performed and is otherwise negative other than what is stated in HPI   BP 129/87   Pulse 79   Temp 97.7 F (36.5 C)   Resp 14   Ht 5\' 8"  (1.727 m)   Wt 199 lb 3.2 oz (90.4 kg)   SpO2 96%   BMI 30.29 kg/m   Physical Exam Vitals and nursing note reviewed. Exam conducted with a chaperone present.  Constitutional:      General: He is not in acute distress.    Appearance: Normal appearance. He is normal weight.  Eyes:     General: No scleral icterus.       Right eye: No discharge.  Left eye: No discharge.  Cardiovascular:     Rate and Rhythm: Normal rate and regular rhythm.     Heart sounds: No murmur.  Pulmonary:     Effort: Pulmonary effort is normal. No respiratory distress.     Breath sounds: No stridor.  Abdominal:     General: Abdomen is flat. There is no distension.     Palpations: Abdomen is soft. There is no mass.     Tenderness: There is no abdominal tenderness. There is no guarding or rebound.     Hernia: A hernia is present.     Comments: Lower midline scar, Reducible and mildly tender RI hernia  Musculoskeletal:        General: No swelling. Normal range of motion.     Cervical back: Normal range of motion and neck supple. No rigidity.  Skin:    General: Skin is warm and  dry.     Capillary Refill: Capillary refill takes less than 2 seconds.     Coloration: Skin is not jaundiced.  Neurological:     General: No focal deficit present.     Mental Status: He is alert and oriented to person, place, and time.  Psychiatric:        Mood and Affect: Mood normal.        Behavior: Behavior normal.        Thought Content: Thought content normal.        Judgment: Judgment normal.         Assessment/Plan:  38 year old male with a symptomatic right inguinal hernia.  I do think that he is a good candidate for robotic repair.  Procedure discussed with the patient in detail.  Risk, benefits and possible applications including but not limited to: Bleeding, infection chronic pain, recurrence.  Specifically we discussed the possibility of finding bilateral defects and at that point I typically repair both.  I also discussed with him in detail about the colonoscopy results.  He understands and wishes to proceed.  Greater than 50% of the 40 minutes  visit was spent in counseling/coordination of care   Caroleen Hamman, MD [place] Surgeon

## 2019-10-23 NOTE — Progress Notes (Signed)
Outpatient Surgical Follow Up  10/23/2019  Luke [name] is an 38 y.o. male.   Chief Complaint  Patient presents with  . Follow-up    Inguinal Hernia    HPI: 38 year old male well-known to our practice with a history of right colectomy performed by Dr. Rosana Hoes last year and now comes in for a new right inguinal hernia.  He reports intermittent pain that is mild to moderate intensity and sharp in nature.  Worsening with    Valsalva.  No fevers no chills.    He did have a repeat colonoscopy that I have personally reviewed showing hyperplastic polyp and serrated polyp no dysplasia. .  I personally reviewed the CT scan from last year showing evidence of large right colon mass as well as inguinal hernia on the right side ( I respectfully disagree with radiologist report , hernia is evident on the right inguinal area).  He denies any prior inguinal surgery operations.  He is able to perform more than 4 METS of activity without any shortness or chest pain  Past Medical History:  Diagnosis Date  . Anemia   . Cancer ([place])    colon ca  . Cecum mass   . Colonic neoplasm   . Diverticulosis of large intestine without diverticulitis   . GERD (gastroesophageal reflux disease)   . Hypertension   . Pneumonia 09/07/2016  . Stomach irritation     Past Surgical History:  Procedure Laterality Date  . COLON SURGERY    . COLONOSCOPY WITH PROPOFOL N/A 07/28/2018   Procedure: COLONOSCOPY WITH PROPOFOL;  Surgeon: Virgel Manifold, MD;  Location: [facility] ENDOSCOPY;  Service: Endoscopy;  Laterality: N/A;  . COLONOSCOPY WITH PROPOFOL N/A 10/18/2019   Procedure: COLONOSCOPY WITH PROPOFOL;  Surgeon: Virgel Manifold, MD;  Location: [facility] ENDOSCOPY;  Service: Endoscopy;  Laterality: N/A;  . ESOPHAGOGASTRODUODENOSCOPY (EGD) WITH PROPOFOL N/A 07/28/2018   Procedure: ESOPHAGOGASTRODUODENOSCOPY (EGD) WITH PROPOFOL;  Surgeon: Virgel Manifold, MD;  Location: [facility] ENDOSCOPY;  Service: Endoscopy;  Laterality: N/A;   . none    . PARTIAL COLECTOMY Right 08/15/2018   Procedure: RIGHT  COLECTOMY;  Surgeon: Vickie Epley, MD;  Location: [facility] ORS;  Service: General;  Laterality: Right;    Family History  Problem Relation Age of Onset  . CAD Maternal Grandmother   . Breast cancer Maternal Grandmother   . Breast cancer Mother   . Hypertension Mother   . Hypertension Father   . Colon cancer Paternal Uncle     Social History:  reports that he has never smoked. He has never used smokeless tobacco. He reports current alcohol use of about 1.0 standard drinks of alcohol per week. He reports previous drug use. Drug: Marijuana.  Allergies: No Known Allergies  Medications reviewed.    ROS Full ROS performed and is otherwise negative other than what is stated in HPI   BP 129/87   Pulse 79   Temp 97.7 F (36.5 C)   Resp 14   Ht 5\' 8"  (1.727 m)   Wt 199 lb 3.2 oz (90.4 kg)   SpO2 96%   BMI 30.29 kg/m   Physical Exam Vitals and nursing note reviewed. Exam conducted with a chaperone present.  Constitutional:      General: He is not in acute distress.    Appearance: Normal appearance. He is normal weight.  Eyes:     General: No scleral icterus.       Right eye: No discharge.  Left eye: No discharge.  Cardiovascular:     Rate and Rhythm: Normal rate and regular rhythm.     Heart sounds: No murmur.  Pulmonary:     Effort: Pulmonary effort is normal. No respiratory distress.     Breath sounds: No stridor.  Abdominal:     General: Abdomen is flat. There is no distension.     Palpations: Abdomen is soft. There is no mass.     Tenderness: There is no abdominal tenderness. There is no guarding or rebound.     Hernia: A hernia is present.     Comments: Lower midline scar, Reducible and mildly tender RI hernia  Musculoskeletal:        General: No swelling. Normal range of motion.     Cervical back: Normal range of motion and neck supple. No rigidity.  Skin:    General: Skin is warm and  dry.     Capillary Refill: Capillary refill takes less than 2 seconds.     Coloration: Skin is not jaundiced.  Neurological:     General: No focal deficit present.     Mental Status: He is alert and oriented to person, place, and time.  Psychiatric:        Mood and Affect: Mood normal.        Behavior: Behavior normal.        Thought Content: Thought content normal.        Judgment: Judgment normal.         Assessment/Plan:  38 year old male with a symptomatic right inguinal hernia.  I do think that he is a good candidate for robotic repair.  Procedure discussed with the patient in detail.  Risk, benefits and possible applications including but not limited to: Bleeding, infection chronic pain, recurrence.  Specifically we discussed the possibility of finding bilateral defects and at that point I typically repair both.  I also discussed with him in detail about the colonoscopy results.  He understands and wishes to proceed.  Greater than 50% of the 40 minutes  visit was spent in counseling/coordination of care   Caroleen Hamman, MD [place] Surgeon

## 2019-10-24 ENCOUNTER — Telehealth: Payer: Self-pay | Admitting: Surgery

## 2019-10-24 NOTE — Telephone Encounter (Signed)
I have called patient to discuss the information below. No answer and I was not able to leave a message on voicemail, as it is full.   Surgery Date: 11/07/19 with Dr Kris Mouton assisted right inguinal hernia repair.  Preadmission Testing Date: 11/01/19 between 8-1:00pm-phone interview.  Covid Testing Date: 11/02/19 between 8-12:00pm - patient advised to go to the [place] [facility]  [employer] Video sent via [employer] [facility] [employer] and [employer].  Please make patient aware to call [phone], between 1-3:00pm the day before surgery, to find out what time to arrive.

## 2019-10-25 NOTE — Telephone Encounter (Signed)
I have called patient to discuss surgery information. No answer. Not able to leave a voicemail due to mailbox being full. I have contact the significant other listed in contacts, she states that she will call patient to advise patient to call office.

## 2019-10-25 NOTE — Telephone Encounter (Signed)
Patient called and I have relayed the information for surgery.

## 2019-11-01 ENCOUNTER — Encounter
Admission: RE | Admit: 2019-11-01 | Discharge: 2019-11-01 | Disposition: A | Discharge status: Home / Self Care | Payer: Self-pay | Source: Ambulatory Visit | Attending: Surgery | Admitting: Surgery

## 2019-11-01 ENCOUNTER — Other Ambulatory Visit: Payer: Self-pay

## 2019-11-01 NOTE — Patient Instructions (Addendum)
Your EKG and COVID test are scheduled on:  Thursday 11/02/2019 @ 11:00 am.  Check in at the [facility] entrance.  Your procedure is scheduled on: Tuesday 11/07/2019 Report to Same Day Surgery 2nd floor Medical Mall [facility] Entrance-take elevator on left to 2nd floor.  Check in with surgery information desk.) To find out your arrival time, call [phone] 1:00-3:00 PM on Monday 11/06/2019  Remember: Instructions that are not followed completely may result in serious medical risk, up to and including death, or upon the discretion of your surgeon and anesthesiologist your surgery may need to be rescheduled.    __x__ 1. Do not eat food (including mints, candies, chewing gum) after midnight the night before your procedure. You may drink clear liquids up to 2 hours before you are scheduled to arrive at the hospital for your procedure.  Do not drink anything within 2 hours of your scheduled arrival to the hospital.  Approved clear liquids:  --Water or Apple juice without pulp  --Clear carbohydrate beverage such as Gatorade or Powerade  --Black Coffee or Clear Tea (No milk, no creamers, do not add anything to the coffee or tea)    __x__ 2. No Alcohol or smoking for 24 hours before or after surgery.   __x__ 3. Notify your doctor if there is any change in your medical condition (cold, fever, infections).   __x__ 4. On the morning of surgery brush your teeth with toothpaste and water.  You may rinse your mouth with mouthwash if you wish.  Do not swallow any toothpaste or mouthwash.  Please read over the following fact sheets that you were given:   [facility] Preparing for Surgery and/or MRSA Information    __x__ Use CHG Soap as directed on instruction sheet.   Do not wear jewelry, lotions, powders, deodorant, or perfumes on the day of surgery.  Do not shave below the face/neck 48 hours prior to surgery.   Do not bring valuables to the hospital.  [facility] is not responsible for any  belongings or valuables.               For patients discharged on the day of surgery, you will NOT be permitted to drive yourself home.  You must have a responsible adult with you for 24 hours after surgery.  __x__ Take these medicines on the morning of surgery with a SMALL SIP OF WATER:  1. Amlodipine (Norvasc)  _____ If you are on any blood thinners (Coumadin, Plavix, Eliquis, Effient, Pradaxa, Pletal), follow recommendations from your doctor about stopping them before surgery.  __x__ STARTING TODAY: Do not take any Anti-inflammatories such as Advil, Ibuprofen, Motrin, Aleve, Naproxen, Naprosyn, BC/Goodies powders or aspirin products. You CAN take Tylenol if needed.   __x__ STARTING TODAY: Do not take any over the counter supplements until after surgery. You CAN keep taking your Iron and Vitamin B-12.

## 2019-11-02 ENCOUNTER — Encounter
Admission: RE | Admit: 2019-11-02 | Discharge: 2019-11-02 | Disposition: A | Discharge status: Home / Self Care | Payer: Self-pay | Source: Ambulatory Visit | Attending: Surgery | Admitting: Surgery

## 2019-11-02 ENCOUNTER — Other Ambulatory Visit: Payer: Self-pay

## 2019-11-02 DIAGNOSIS — Z20828 Contact with and (suspected) exposure to other viral communicable diseases: Secondary | ICD-10-CM | POA: Insufficient documentation

## 2019-11-02 DIAGNOSIS — K4090 Unilateral inguinal hernia, without obstruction or gangrene, not specified as recurrent: Secondary | ICD-10-CM | POA: Insufficient documentation

## 2019-11-02 DIAGNOSIS — R9431 Abnormal electrocardiogram [ECG] [EKG]: Secondary | ICD-10-CM | POA: Insufficient documentation

## 2019-11-02 DIAGNOSIS — I10 Essential (primary) hypertension: Secondary | ICD-10-CM | POA: Insufficient documentation

## 2019-11-02 DIAGNOSIS — Z01818 Encounter for other preprocedural examination: Secondary | ICD-10-CM | POA: Insufficient documentation

## 2019-11-03 LAB — SARS CORONAVIRUS 2 (TAT 6-24 HRS): SARS Coronavirus 2: NEGATIVE

## 2019-11-07 ENCOUNTER — Encounter: Payer: Self-pay | Admitting: Surgery

## 2019-11-07 ENCOUNTER — Encounter
Admission: RE | Disposition: A | Discharge status: Home / Self Care | Payer: Self-pay | Source: Ambulatory Visit | Attending: Surgery

## 2019-11-07 ENCOUNTER — Other Ambulatory Visit: Payer: Self-pay

## 2019-11-07 ENCOUNTER — Ambulatory Visit
Admission: RE | Admit: 2019-11-07 | Discharge: 2019-11-07 | Disposition: A | Discharge status: Home / Self Care | Payer: Self-pay | Source: Ambulatory Visit | Attending: Surgery | Admitting: Surgery

## 2019-11-07 ENCOUNTER — Ambulatory Visit: Payer: Self-pay | Admitting: Anesthesiology

## 2019-11-07 DIAGNOSIS — I10 Essential (primary) hypertension: Secondary | ICD-10-CM | POA: Insufficient documentation

## 2019-11-07 DIAGNOSIS — Z9049 Acquired absence of other specified parts of digestive tract: Secondary | ICD-10-CM | POA: Insufficient documentation

## 2019-11-07 DIAGNOSIS — Z85038 Personal history of other malignant neoplasm of large intestine: Secondary | ICD-10-CM | POA: Insufficient documentation

## 2019-11-07 DIAGNOSIS — K219 Gastro-esophageal reflux disease without esophagitis: Secondary | ICD-10-CM | POA: Insufficient documentation

## 2019-11-07 DIAGNOSIS — K5730 Diverticulosis of large intestine without perforation or abscess without bleeding: Secondary | ICD-10-CM | POA: Insufficient documentation

## 2019-11-07 DIAGNOSIS — Z8249 Family history of ischemic heart disease and other diseases of the circulatory system: Secondary | ICD-10-CM | POA: Insufficient documentation

## 2019-11-07 DIAGNOSIS — K4090 Unilateral inguinal hernia, without obstruction or gangrene, not specified as recurrent: Secondary | ICD-10-CM | POA: Insufficient documentation

## 2019-11-07 DIAGNOSIS — Z803 Family history of malignant neoplasm of breast: Secondary | ICD-10-CM | POA: Insufficient documentation

## 2019-11-07 LAB — URINE DRUG SCREEN, QUALITATIVE (ARMC ONLY)
Amphetamines, Ur Screen: NOT DETECTED
Barbiturates, Ur Screen: NOT DETECTED
Benzodiazepine, Ur Scrn: NOT DETECTED
Cannabinoid 50 Ng, Ur ~~LOC~~: NOT DETECTED
Cocaine Metabolite,Ur ~~LOC~~: POSITIVE — AB
MDMA (Ecstasy)Ur Screen: NOT DETECTED
Methadone Scn, Ur: NOT DETECTED
Opiate, Ur Screen: NOT DETECTED
Phencyclidine (PCP) Ur S: NOT DETECTED
Tricyclic, Ur Screen: NOT DETECTED

## 2019-11-07 SURGERY — REPAIR, HERNIA, INGUINAL, ROBOT-ASSISTED, LAPAROSCOPIC, USING MESH
Anesthesia: General | Laterality: Right

## 2019-11-07 MED ORDER — ROCURONIUM BROMIDE 50 MG/5ML IV SOLN
INTRAVENOUS | Status: AC
  Filled 2019-11-07: qty 1

## 2019-11-07 MED ORDER — SUCCINYLCHOLINE CHLORIDE 20 MG/ML IJ SOLN
INTRAMUSCULAR | Status: AC
  Filled 2019-11-07: qty 1

## 2019-11-07 MED ORDER — LIDOCAINE HCL (PF) 2 % IJ SOLN
INTRAMUSCULAR | Status: AC
  Filled 2019-11-07: qty 10

## 2019-11-07 MED ORDER — MIDAZOLAM HCL 2 MG/2ML IJ SOLN
INTRAMUSCULAR | Status: AC
  Filled 2019-11-07: qty 2

## 2019-11-07 MED ORDER — LACTATED RINGERS IV SOLN
INTRAVENOUS | Status: DC
  Administered 2019-11-07: 75 mL/h via INTRAVENOUS

## 2019-11-07 MED ORDER — CEFAZOLIN SODIUM-DEXTROSE 2-4 GM/100ML-% IV SOLN: 2.0000 g | INTRAVENOUS | Status: DC

## 2019-11-07 MED ORDER — CHLORHEXIDINE GLUCONATE CLOTH 2 % EX PADS: 6.0000 | MEDICATED_PAD | Freq: Once | CUTANEOUS | Status: DC

## 2019-11-07 MED ORDER — FAMOTIDINE 20 MG PO TABS
ORAL_TABLET | ORAL | Status: AC
  Administered 2019-11-07: 06:00:00 20 mg via ORAL
  Filled 2019-11-07: qty 1

## 2019-11-07 MED ORDER — EPINEPHRINE PF 1 MG/ML IJ SOLN
INTRAMUSCULAR | Status: AC
  Filled 2019-11-07: qty 1

## 2019-11-07 MED ORDER — PROPOFOL 10 MG/ML IV BOLUS
INTRAVENOUS | Status: AC
  Filled 2019-11-07: qty 40

## 2019-11-07 MED ORDER — FAMOTIDINE 20 MG PO TABS: 20.0000 mg | ORAL_TABLET | Freq: Once | ORAL | Status: AC

## 2019-11-07 MED ORDER — BUPIVACAINE HCL (PF) 0.25 % IJ SOLN
INTRAMUSCULAR | Status: AC
  Filled 2019-11-07: qty 30

## 2019-11-07 MED ORDER — CEFAZOLIN SODIUM-DEXTROSE 2-4 GM/100ML-% IV SOLN
INTRAVENOUS | Status: AC
  Filled 2019-11-07: qty 100

## 2019-11-07 MED ORDER — FENTANYL CITRATE (PF) 100 MCG/2ML IJ SOLN
INTRAMUSCULAR | Status: AC
  Filled 2019-11-07: qty 2

## 2019-11-07 SURGICAL SUPPLY — 43 items
CANISTER SUCT 1200ML W/VALVE (MISCELLANEOUS) IMPLANT
CHLORAPREP W/TINT 26 (MISCELLANEOUS) IMPLANT
COVER TIP SHEARS 8 DVNC (MISCELLANEOUS) IMPLANT
COVER TIP SHEARS 8MM DA VINCI (MISCELLANEOUS)
COVER WAND RF STERILE (DRAPES) IMPLANT
DEFOGGER SCOPE WARMER CLEARIFY (MISCELLANEOUS) IMPLANT
DERMABOND ADVANCED (GAUZE/BANDAGES/DRESSINGS)
DERMABOND ADVANCED .7 DNX12 (GAUZE/BANDAGES/DRESSINGS) IMPLANT
DRAPE 3/4 80X56 (DRAPES) IMPLANT
DRAPE ARM DVNC X/XI (DISPOSABLE) IMPLANT
DRAPE COLUMN DVNC XI (DISPOSABLE) IMPLANT
DRAPE DA VINCI XI ARM (DISPOSABLE)
DRAPE DA VINCI XI COLUMN (DISPOSABLE)
ELECT CAUTERY BLADE 6.4 (BLADE) IMPLANT
ELECT REM PT RETURN 9FT ADLT (ELECTROSURGICAL)
ELECTRODE REM PT RTRN 9FT ADLT (ELECTROSURGICAL) IMPLANT
GLOVE BIO SURGEON STRL SZ7 (GLOVE) IMPLANT
GOWN STRL REUS W/ TWL LRG LVL3 (GOWN DISPOSABLE) IMPLANT
GOWN STRL REUS W/TWL LRG LVL3 (GOWN DISPOSABLE)
IRRIGATION STRYKERFLOW (MISCELLANEOUS) IMPLANT
IRRIGATOR STRYKERFLOW (MISCELLANEOUS)
IV NS 1000ML (IV SOLUTION)
IV NS 1000ML BAXH (IV SOLUTION) IMPLANT
KIT PINK PAD W/HEAD ARE REST (MISCELLANEOUS)
KIT PINK PAD W/HEAD ARM REST (MISCELLANEOUS) IMPLANT
LABEL OR SOLS (LABEL) IMPLANT
NEEDLE HYPO 22GX1.5 SAFETY (NEEDLE) IMPLANT
OBTURATOR OPTICAL STANDARD 8MM (TROCAR)
OBTURATOR OPTICAL STND 8 DVNC (TROCAR)
OBTURATOR OPTICALSTD 8 DVNC (TROCAR) IMPLANT
PACK LAP CHOLECYSTECTOMY (MISCELLANEOUS) IMPLANT
PENCIL ELECTRO HAND CTR (MISCELLANEOUS) IMPLANT
SEAL CANN UNIV 5-8 DVNC XI (MISCELLANEOUS) IMPLANT
SEAL XI 5MM-8MM UNIVERSAL (MISCELLANEOUS)
SOLUTION ELECTROLUBE (MISCELLANEOUS) IMPLANT
SPONGE LAP 18X18 RF (DISPOSABLE) IMPLANT
SUT MNCRL AB 4-0 PS2 18 (SUTURE) IMPLANT
SUT VIC AB 2-0 SH 27 (SUTURE)
SUT VIC AB 2-0 SH 27XBRD (SUTURE) IMPLANT
SUT VICRYL 0 AB UR-6 (SUTURE) IMPLANT
SUT VLOC 90 S/L VL9 GS22 (SUTURE) IMPLANT
TROCAR BALLN GELPORT 12X130M (ENDOMECHANICALS) IMPLANT
TUBING EVAC SMOKE HEATED PNEUM (TUBING) IMPLANT

## 2019-11-07 NOTE — Interval H&P Note (Signed)
History and Physical Interval Note:  11/07/2019 7:21 AM  Luke [name]  has presented today for surgery, with the diagnosis of right inguinal hernia.  The various methods of treatment have been discussed with the patient and family. After consideration of risks, benefits and other options for treatment, the patient has consented to  Procedure(s): XI ROBOTIC [place] (Right) as a surgical intervention.  The patient's history has been reviewed, patient examined, no change in status, stable for surgery.  I have reviewed the patient's chart and labs.  Questions were answered to the patient's satisfaction.     [place]

## 2019-11-07 NOTE — Anesthesia Preprocedure Evaluation (Deleted)
Anesthesia Evaluation  Patient identified by MRN, date of birth, ID band Patient awake    Reviewed: Allergy & Precautions, NPO status , Patient's Chart, lab work & pertinent test results  History of Anesthesia Complications Negative for: history of anesthetic complications  Airway Mallampati: II  TM Distance: >3 FB Neck ROM: Full    Dental no notable dental hx. (+) Teeth Intact   Pulmonary neg COPD, Patient abstained from smoking.Not current smoker,  Patient snores heavily at night, gets told at times he stops breathing. Never formally worked up for OSA   Pulmonary exam normal breath sounds clear to auscultation       Cardiovascular Exercise Tolerance: Good METShypertension, Pt. on medications (-) CAD and (-) Past MI negative cardio ROS   Rhythm:Regular Rate:Normal - Systolic murmurs    Neuro/Psych negative neurological ROS  negative psych ROS   GI/Hepatic neg GERD  ,(+)     substance abuse  cocaine use, Hx cocaine + in 2019   Endo/Other  neg diabetes  Renal/GU negative Renal ROS     Musculoskeletal   Abdominal   Peds  Hematology   Anesthesia Other Findings Past Medical History: No date: Anemia No date: Cancer ([place])     Comment:  colon ca No date: Cecum mass No date: Colonic neoplasm No date: Diverticulosis of large intestine without diverticulitis No date: GERD (gastroesophageal reflux disease) No date: Hypertension 09/07/2016: Pneumonia No date: Stomach irritation  Reproductive/Obstetrics                             Anesthesia Physical Anesthesia Plan  ASA: II  Anesthesia Plan: General   Post-op Pain Management:    Induction: Intravenous  PONV Risk Score and Plan: 4 or greater and Ondansetron, Dexamethasone and Midazolam  Airway Management Planned: Oral ETT  Additional Equipment: None  Intra-op Plan:   Post-operative Plan: Extubation in OR  Informed Consent:  I have reviewed the patients History and Physical, chart, labs and discussed the procedure including the risks, benefits and alternatives for the proposed anesthesia with the patient or authorized representative who has indicated his/her understanding and acceptance.     Dental advisory given  Plan Discussed with: CRNA and Surgeon  Anesthesia Plan Comments: (Discussed risks of anesthesia with patient, including PONV, sore throat, lip/dental damage. Rare risks discussed as well, such as cardiorespiratory sequelae. Patient understands. Patient educated on increased perioperative risk due to OSA/high risk of OSA, including cardiorespiratory sequelae. Patient educated on appropriate OSA medical care, including follow up with PCP.  Will obtain urine drug screen (due to policy concerning prior cocaine positive tox screen) prior to proceeding.)        Anesthesia Quick Evaluation

## 2019-11-07 NOTE — OR Nursing (Signed)
After reviewing lab results for UDS and revealed positive for cocaine. DR. Tollie Pizza and Dr. Dahlia Byes notified. Pt. was made aware by Dr. Tollie Pizza..Pt. surgery was cancelled.

## 2019-11-07 NOTE — Progress Notes (Signed)
Patient's urine drug screen resulted with cocaine positive. Explained to patient the high risk of anesthetic and surgical complications while cocaine positive and need to cancel/reschedule surgery. Patient understanding. I counseled patient about drug cessation.

## 2019-11-15 ENCOUNTER — Other Ambulatory Visit: Payer: Self-pay

## 2019-11-15 ENCOUNTER — Telehealth: Payer: Self-pay | Admitting: Surgery

## 2019-11-15 DIAGNOSIS — E538 Deficiency of other specified B group vitamins: Secondary | ICD-10-CM

## 2019-11-15 DIAGNOSIS — I10 Essential (primary) hypertension: Secondary | ICD-10-CM

## 2019-11-15 DIAGNOSIS — D509 Iron deficiency anemia, unspecified: Secondary | ICD-10-CM

## 2019-11-15 NOTE — Telephone Encounter (Signed)
Patient did not answer & voicemail is full; mother's line was busy; left message w/Theresa requesting the patient call the office tomorrow for detailed information.  When the patient calls back, please advise of the following of pre admission date/time, Covid Testing date and Surgery date.  Surgery Date: 11/24/19 Preadmission Testing Date: 11/20/19 Covid Testing Date: 11/22/19 - patient advised to go to the [place] ([place])  Patient has been made aware to call [phone], between 1-3:00pm the day before surgery, to find out what time to arrive.

## 2019-11-16 LAB — CBC WITH DIFFERENTIAL/PLATELET
Basophils Absolute: 0.1 10*3/uL (ref 0.0–0.2)
Basos: 1 %
EOS (ABSOLUTE): 0.1 10*3/uL (ref 0.0–0.4)
Eos: 2 %
Hematocrit: 50.3 % (ref 37.5–51.0)
Hemoglobin: 16.6 g/dL (ref 13.0–17.7)
Immature Grans (Abs): 0 10*3/uL (ref 0.0–0.1)
Immature Granulocytes: 0 %
Lymphocytes Absolute: 2.6 10*3/uL (ref 0.7–3.1)
Lymphs: 41 %
MCH: 28.8 pg (ref 26.6–33.0)
MCHC: 33 g/dL (ref 31.5–35.7)
MCV: 87 fL (ref 79–97)
Monocytes Absolute: 0.6 10*3/uL (ref 0.1–0.9)
Monocytes: 9 %
Neutrophils Absolute: 3 10*3/uL (ref 1.4–7.0)
Neutrophils: 47 %
Platelets: 196 10*3/uL (ref 150–450)
RBC: 5.76 x10E6/uL (ref 4.14–5.80)
RDW: 12.5 % (ref 11.6–15.4)
WBC: 6.3 10*3/uL (ref 3.4–10.8)

## 2019-11-16 LAB — LIPID PANEL
Chol/HDL Ratio: 8.5 ratio — ABNORMAL HIGH (ref 0.0–5.0)
Cholesterol, Total: 221 mg/dL — ABNORMAL HIGH (ref 100–199)
HDL: 26 mg/dL — ABNORMAL LOW (ref >39)
LDL Chol Calc (NIH): 148 mg/dL — ABNORMAL HIGH (ref 0–99)
Triglycerides: 252 mg/dL — ABNORMAL HIGH (ref 0–149)
VLDL Cholesterol Cal: 47 mg/dL — ABNORMAL HIGH (ref 5–40)

## 2019-11-16 LAB — VITAMIN B12: Vitamin B-12: 546 pg/mL (ref 232–1245)

## 2019-11-16 LAB — HEMOGLOBIN A1C
Est. average glucose Bld gHb Est-mCnc: 137 mg/dL
Hgb A1c MFr Bld: 6.4 % — ABNORMAL HIGH (ref 4.8–5.6)

## 2019-11-16 LAB — IRON: Iron: 136 ug/dL (ref 38–169)

## 2019-11-16 LAB — FERRITIN: Ferritin: 100 ng/mL (ref 30–400)

## 2019-11-20 ENCOUNTER — Other Ambulatory Visit: Payer: Self-pay

## 2019-11-20 ENCOUNTER — Encounter
Admission: RE | Admit: 2019-11-20 | Discharge: 2019-11-20 | Disposition: A | Discharge status: Home / Self Care | Payer: Self-pay | Source: Ambulatory Visit | Attending: Surgery | Admitting: Surgery

## 2019-11-20 DIAGNOSIS — Z01818 Encounter for other preprocedural examination: Secondary | ICD-10-CM | POA: Insufficient documentation

## 2019-11-20 NOTE — Patient Instructions (Signed)
Your procedure is scheduled [id number] 1/22 Report to Day Surgery. Medical Mall To find out your arrival time please call [phone] between 1PM - 3PM on  Thurs 1/21.  Remember: Instructions that are not followed completely may result in serious medical risk,  up to and including death, or upon the discretion of your surgeon and anesthesiologist your  surgery may need to be rescheduled.     _X__ 1. Do not eat food after midnight the night before your procedure.                 No gum chewing or hard candies. You may drink clear liquids up to 2 hours                 before you are scheduled to arrive for your surgery- DO not drink clear                 liquids within 2 hours of the start of your surgery.                 Clear Liquids include:  water, apple juice without pulp, clear carbohydrate                 drink such as Clearfast of Gatorade, Black Coffee or Tea (Do not add                 anything to coffee or tea).  __X__2.  On the morning of surgery brush your teeth with toothpaste and water, you                may rinse your mouth with mouthwash if you wish.  Do not swallow any toothpaste of mouthwash.     _X__ 3.  No Alcohol for 24 hours before or after surgery.   ___ 4.  Do Not Smoke or use e-cigarettes For 24 Hours Prior to Your Surgery.                 Do not use any chewable tobacco products for at least 6 hours prior to                 surgery.  ____  5.  Bring all medications with you on the day of surgery if instructed.   _x___  6.  Notify your doctor if there is any change in your medical condition      (cold, fever, infections).     Do not wear jewelry, make-up, hairpins, clips or nail polish. Do not wear lotions, powders, or perfumes. You may wear deodorant. Do not shave 48 hours prior to surgery. Men may shave face and neck. Do not bring valuables to the hospital.    [facility] is not responsible for any belongings or  valuables.  Contacts, dentures or bridgework may not be worn into surgery. Leave your suitcase in the car. After surgery it may be brought to your room. For patients admitted to the hospital, discharge time is determined by your treatment team.   Patients discharged the day of surgery will not be allowed to drive home.   Please read over the following fact sheets that you were given:     __x__ Take these medicines the morning of surgery with A SIP OF WATER:    1. amLODipine (NORVASC) 5 MG tablet  2.   3.   4.  5.  6.  ____ Fleet Enema (as directed)   __x__ Use CHG Soap as directed  ____ Use inhalers on the day of surgery  ____ Stop metformin 2 days prior to surgery    ____ Take 1/2 of usual insulin dose the night before surgery. No insulin the morning          of surgery.   ____ Stop Coumadin/Plavix/aspirin on   __x__ Stop Anti-inflammatories no ibuprofen aleve or aspirin     May take tylenol   ____ Stop supplements until after surgery.    ____ Bring C-Pap to the hospital.

## 2019-11-20 NOTE — Addendum Note (Signed)
Addended by: Caroleen Hamman F on: 11/20/2019 04:05 PM   Modules accepted: Orders, SmartSet

## 2019-11-22 ENCOUNTER — Other Ambulatory Visit
Admission: RE | Admit: 2019-11-22 | Discharge: 2019-11-22 | Disposition: A | Discharge status: Home / Self Care | Payer: Self-pay | Source: Ambulatory Visit | Attending: Surgery | Admitting: Surgery

## 2019-11-22 NOTE — Progress Notes (Signed)
Attempted to call, unable to leave message due to mailbox being "full". Did Not Show Up for Covid testing today. Stacy from Dr. Corlis Leak office made aware.

## 2019-11-23 ENCOUNTER — Other Ambulatory Visit: Payer: Self-pay

## 2019-11-23 ENCOUNTER — Ambulatory Visit: Payer: Self-pay | Admitting: Gerontology

## 2019-11-23 ENCOUNTER — Other Ambulatory Visit
Admission: RE | Admit: 2019-11-23 | Discharge: 2019-11-23 | Disposition: A | Discharge status: Home / Self Care | Payer: Self-pay | Source: Ambulatory Visit | Attending: Surgery | Admitting: Surgery

## 2019-11-23 DIAGNOSIS — Z01812 Encounter for preprocedural laboratory examination: Secondary | ICD-10-CM | POA: Diagnosis present

## 2019-11-23 DIAGNOSIS — Z20822 Contact with and (suspected) exposure to covid-19: Secondary | ICD-10-CM | POA: Diagnosis not present

## 2019-11-23 LAB — SARS CORONAVIRUS 2 (TAT 6-24 HRS): SARS Coronavirus 2: NEGATIVE

## 2019-11-24 ENCOUNTER — Ambulatory Visit: Admission: RE | Admit: 2019-11-24 | Payer: Self-pay | Source: Home / Self Care | Admitting: Surgery

## 2019-11-24 ENCOUNTER — Encounter: Payer: Self-pay | Admitting: Anesthesiology

## 2019-11-24 ENCOUNTER — Encounter: Admission: RE | Payer: Self-pay | Source: Home / Self Care

## 2019-11-24 ENCOUNTER — Telehealth: Payer: Self-pay | Admitting: Surgery

## 2019-11-24 SURGERY — REPAIR, HERNIA, INGUINAL, ROBOT-ASSISTED, LAPAROSCOPIC, USING MESH
Anesthesia: General | Laterality: Right

## 2019-11-24 NOTE — Telephone Encounter (Signed)
Outbound call made to the patient to provide updated information on new surgery & COVID testing date.  Reminder set to made additional attempts to make contact.

## 2019-12-07 ENCOUNTER — Ambulatory Visit: Payer: Self-pay | Admitting: Gerontology

## 2019-12-07 ENCOUNTER — Telehealth: Payer: Self-pay | Admitting: Gerontology

## 2019-12-07 NOTE — Telephone Encounter (Signed)
I attempted to call Luke [name] again to r/s his missed appt. Phone rang 3 times before going to voicemail, vm box is full and I was unable to leave a message

## 2019-12-07 NOTE — Telephone Encounter (Signed)
I called at 10:13am to r/s appt, his mailbox is full so I was unable to leave a message for the pt

## 2019-12-13 ENCOUNTER — Telehealth: Payer: Self-pay | Admitting: Gerontology

## 2019-12-13 NOTE — Telephone Encounter (Signed)
I called Luke [name] at 2:23pm 2/10. The phone rang twice before going to voicemail and giving the message that the vm box is full. Have tried in the past to call but have gotten the same outcome

## 2019-12-13 NOTE — Telephone Encounter (Signed)
Pt has been advised of pre admission date/time, Covid Testing date and Surgery date.  Surgery Date: 01/05/20 Preadmission Testing Date: Previously Completed (11/20/19) Covid Testing Date: 01/03/20 - patient advised to go to the [facility] ([place])  Patient has been made aware to call [phone], between 1-3:00pm the day before surgery, to find out what time to arrive.

## 2019-12-14 ENCOUNTER — Telehealth: Payer: Self-pay | Admitting: Gerontology

## 2019-12-14 NOTE — Telephone Encounter (Signed)
I called pt at 4:48pm on 2/11. After previous attempts he answered on the first ring. I was still unable to r/s his missed appointment because he was in the middle of doing something. Pt said he would call right back

## 2019-12-14 NOTE — Telephone Encounter (Signed)
I attempted to call Mr. Luke [name] at 1:18pm 2/11. Phone rang twice before saying the VM box was full. I have gotten this message a few times but will continue to try to contact him

## 2019-12-19 ENCOUNTER — Telehealth: Payer: Self-pay | Admitting: Gerontology

## 2019-12-19 NOTE — Telephone Encounter (Signed)
Called pt at 1:20; rang 2x and went to voicemail message saying the voicemail box was full - Luke [name]

## 2020-01-02 ENCOUNTER — Telehealth: Payer: Self-pay | Admitting: Gerontology

## 2020-01-02 NOTE — Telephone Encounter (Signed)
Called pt at 3:58pm to schedule f/u appointment that pt missed in Feb. and mailbox was full -[employer]

## 2020-01-03 ENCOUNTER — Telehealth: Payer: Self-pay | Admitting: Surgery

## 2020-01-03 ENCOUNTER — Other Ambulatory Visit
Admission: RE | Admit: 2020-01-03 | Discharge: 2020-01-03 | Disposition: A | Discharge status: Home / Self Care | Payer: Self-pay | Source: Ambulatory Visit | Attending: Surgery | Admitting: Surgery

## 2020-01-03 NOTE — Telephone Encounter (Signed)
Outbound call made to the pt after receiving notification from pre-admission that the pt did not show for his scheduled COVID testing prior to surgery (01/05/20).  The pt stated he was unaware of testing & had forgotten about surgery; knowing this is the third attempt to schedule surgery he is cancelling @ this point & will call back if/when he is ready to reschedule.  Thank you

## 2020-01-03 NOTE — Progress Notes (Signed)
No show for covid testing today, tried to call the patient (no answer and unable to leave message). Call to Dr. Corlis Leak office and inform them that the patient did not show up for his covid test.

## 2020-01-05 ENCOUNTER — Ambulatory Visit: Admission: RE | Admit: 2020-01-05 | Payer: Self-pay | Source: Home / Self Care | Admitting: Surgery

## 2020-01-05 ENCOUNTER — Encounter: Admission: RE | Payer: Self-pay | Source: Home / Self Care

## 2020-01-05 SURGERY — REPAIR, HERNIA, INGUINAL, ROBOT-ASSISTED, LAPAROSCOPIC, USING MESH
Anesthesia: General | Laterality: Right

## 2020-01-09 ENCOUNTER — Telehealth: Payer: Self-pay

## 2020-01-10 NOTE — Telephone Encounter (Signed)
No further notes

## 2020-01-18 ENCOUNTER — Telehealth: Payer: Self-pay | Admitting: Gerontology

## 2020-01-18 NOTE — Telephone Encounter (Signed)
Called pt to reschedule his appt, spoke with him, and scheduled him for 4/1 at 1:00 pm

## 2020-02-01 ENCOUNTER — Ambulatory Visit: Payer: Self-pay | Admitting: Gerontology

## 2020-02-01 ENCOUNTER — Encounter: Payer: Self-pay | Admitting: Gerontology

## 2020-02-01 ENCOUNTER — Other Ambulatory Visit: Payer: Self-pay

## 2020-02-01 VITALS — BP 127/88 | HR 89 | Temp 98.6°F | Resp 18 | Ht 67.72 in | Wt 203.0 lb

## 2020-02-01 DIAGNOSIS — I10 Essential (primary) hypertension: Secondary | ICD-10-CM

## 2020-02-01 DIAGNOSIS — E785 Hyperlipidemia, unspecified: Secondary | ICD-10-CM | POA: Insufficient documentation

## 2020-02-01 DIAGNOSIS — R7303 Prediabetes: Secondary | ICD-10-CM | POA: Insufficient documentation

## 2020-02-01 MED ORDER — METFORMIN HCL 1000 MG PO TABS: 500.0000 mg | ORAL_TABLET | Freq: Two times a day (BID) | ORAL | 0 refills | Status: DC

## 2020-02-01 NOTE — Progress Notes (Signed)
Established Patient Office Visit  Subjective:  Patient ID: CHATHAM BREDEHOFT, male    DOB: 24-Apr-1981  Age: 39 y.o. MRN: [id number]  CC: No chief complaint on file.   HPI Nicki HYLAN GATT presents for follow up of hypertension and lab review. He missed several of his follow up appointments. His HgbA1c done on 11/15/2019 was 6.4% and Lipid panel, Total cholesterol 221 mg/dl, Triglycerides 252 mg/dl, HDL was 26 and LDL was 148 mg/dl. He states that he's compliant with his medications,checks his blood pressure at home and adheres to DASH diet. He states that he will schedule an appointment for inguinal hernia repair with Dr Dahlia Byes. He had Colonoscopy done on 10/18/2019, it showed one 5 mm polyp at the anastomosis, removed with a jumbo cold forceps, resected and retrieved.  One 4 mm polyp in the sigmoid colon, removed with a jumbo cold forceps, resected and retrieved. - The examination was otherwise normal. - The distal rectum and anal verge are normal on retroflexion view and Colonoscopy will be repeated in one year.  He denies chest pain, palpitation, light headedness and peripheral edema. Overall, he states that he's doing well and offers no further complaint.  Past Medical History:  Diagnosis Date  . Anemia   . Cancer ([place])    colon ca  . Cecum mass   . Colonic neoplasm   . Diverticulosis of large intestine without diverticulitis   . GERD (gastroesophageal reflux disease)   . Hypertension   . Pneumonia 09/07/2016  . Stomach irritation     Past Surgical History:  Procedure Laterality Date  . COLON SURGERY    . COLONOSCOPY WITH PROPOFOL N/A 07/28/2018   Procedure: COLONOSCOPY WITH PROPOFOL;  Surgeon: Virgel Manifold, MD;  Location: [facility] ENDOSCOPY;  Service: Endoscopy;  Laterality: N/A;  . COLONOSCOPY WITH PROPOFOL N/A 10/18/2019   Procedure: COLONOSCOPY WITH PROPOFOL;  Surgeon: Virgel Manifold, MD;  Location: [facility] ENDOSCOPY;  Service: Endoscopy;  Laterality: N/A;  .  ESOPHAGOGASTRODUODENOSCOPY (EGD) WITH PROPOFOL N/A 07/28/2018   Procedure: ESOPHAGOGASTRODUODENOSCOPY (EGD) WITH PROPOFOL;  Surgeon: Virgel Manifold, MD;  Location: [facility] ENDOSCOPY;  Service: Endoscopy;  Laterality: N/A;  . none    . PARTIAL COLECTOMY Right 08/15/2018   Procedure: RIGHT  COLECTOMY;  Surgeon: Vickie Epley, MD;  Location: [facility] ORS;  Service: General;  Laterality: Right;    Family History  Problem Relation Age of Onset  . CAD Maternal Grandmother   . Breast cancer Maternal Grandmother   . Breast cancer Mother   . Hypertension Mother   . Hypertension Father   . Colon cancer Paternal Uncle     Social History   Socioeconomic History  . Marital status: Single    Spouse name: Not on file  . Number of children: Not on file  . Years of education: Not on file  . Highest education level: Not on file  Occupational History  . Not on file  Tobacco Use  . Smoking status: Never Smoker  . Smokeless tobacco: Never Used  . Tobacco comment: patient is social smoker/ smokes when he drinks   Substance and Sexual Activity  . Alcohol use: Yes    Alcohol/week: 1.0 standard drinks    Types: 1 Cans of beer per week    Comment: patient reports he consumes ~2 beers about once per month  . Drug use: Not Currently    Types: Marijuana, Cocaine  . Sexual activity: Yes    Birth control/protection: Condom  Other Topics Concern  .  Not on file  Social History Narrative  . Not on file   Social Determinants of Health   Financial Resource Strain:   . Difficulty of Paying Living Expenses:   Food Insecurity:   . Worried About Charity fundraiser in the Last Year:   . Arboriculturist in the Last Year:   Transportation Needs:   . Film/video editor (Medical):   Marland Kitchen Lack of Transportation (Non-Medical):   Physical Activity:   . Days of Exercise per Week:   . Minutes of Exercise per Session:   Stress:   . Feeling of Stress :   Social Connections:   . Frequency of  Communication with Friends and Family:   . Frequency of Social Gatherings with Friends and Family:   . Attends Religious Services:   . Active Member of Clubs or Organizations:   . Attends Archivist Meetings:   Marland Kitchen Marital Status:   Intimate Partner Violence:   . Fear of Current or Ex-Partner:   . Emotionally Abused:   Marland Kitchen Physically Abused:   . Sexually Abused:     Outpatient Medications Prior to Visit  Medication Sig Dispense Refill  . amLODipine (NORVASC) 5 MG tablet Take 1 tablet (5 mg total) by mouth daily. 90 tablet 1  . Cyanocobalamin (VITAMIN B-12 PO) Take 1 tablet by mouth daily.    . IRON PO Take 1 tablet by mouth daily.    . polyethylene glycol (MIRALAX) 17 g packet Take 17 g by mouth daily. 14 each 0   No facility-administered medications prior to visit.    No Known Allergies  ROS Review of Systems  Constitutional: Negative.   Respiratory: Negative.   Cardiovascular: Negative.   Gastrointestinal: Negative.   Neurological: Negative.   Psychiatric/Behavioral: Negative.       Objective:    Physical Exam  Constitutional: He is oriented to person, place, and time. He appears well-developed.  HENT:  Head: Normocephalic and atraumatic.  Eyes: Pupils are equal, round, and reactive to light. EOM are normal.  Cardiovascular: Normal rate and regular rhythm.  Pulmonary/Chest: Effort normal and breath sounds normal.  Abdominal: Soft. Bowel sounds are normal.  Neurological: He is alert and oriented to person, place, and time.  Psychiatric: He has a normal mood and affect. His behavior is normal. Judgment and thought content normal.    BP 127/88 (BP Location: Left Arm, Patient Position: Sitting, Cuff Size: Large)   Pulse 89   Temp 98.6 F (37 C) (Oral)   Resp 18   Ht 5' 7.72" (1.72 m)   Wt 203 lb (92.1 kg)   SpO2 97%   BMI 31.12 kg/m  Wt Readings from Last 3 Encounters:  02/01/20 203 lb (92.1 kg)  11/20/19 199 lb 15.3 oz (90.7 kg)  11/07/19 199 lb  15.3 oz (90.7 kg)   He gained 4 pounds and was encouraged to continue on a weight loss regimen.  Health Maintenance Due  Topic Date Due  . TETANUS/TDAP  Never done    There are no preventive care reminders to display for this patient.  Lab Results  Component Value Date   TSH 0.622 07/04/2018   Lab Results  Component Value Date   WBC 6.3 11/15/2019   HGB 16.6 11/15/2019   HCT 50.3 11/15/2019   MCV 87 11/15/2019   PLT 196 11/15/2019   Lab Results  Component Value Date   NA 139 05/11/2019   K 3.8 05/11/2019   CO2 22 05/11/2019  GLUCOSE 106 (H) 05/11/2019   BUN 9 05/11/2019   CREATININE 1.17 05/11/2019   BILITOT 0.6 05/11/2019   ALKPHOS 56 05/11/2019   AST 34 05/11/2019   ALT 39 05/11/2019   PROT 7.0 05/11/2019   ALBUMIN 3.8 05/11/2019   CALCIUM 8.9 05/11/2019   ANIONGAP 11 05/11/2019   Lab Results  Component Value Date   CHOL 221 (H) 11/15/2019   Lab Results  Component Value Date   HDL 26 (L) 11/15/2019   Lab Results  Component Value Date   LDLCALC 148 (H) 11/15/2019   Lab Results  Component Value Date   TRIG 252 (H) 11/15/2019   Lab Results  Component Value Date   CHOLHDL 8.5 (H) 11/15/2019   Lab Results  Component Value Date   HGBA1C 6.4 (H) 11/15/2019      Assessment & Plan:    1. Essential hypertension - His blood pressure is under control and he will continue on current treatment regimen. He was advised to continue on DASH diet, exercise as tolerated. His blood pressure goal should be less than 140/90 and normal is less than 120/80.  2. Prediabetes - His HgbA1c was 6.4%, he agreed to start Metformin therapy, he was educated on medication side effects and was advised to notify clinic. He was advised to continue on Low carb/ non concentrated sweet diet. - HgB A1c; Future - metFORMIN (GLUCOPHAGE) 1000 MG tablet; Take 0.5 tablets (500 mg total) by mouth 2 (two) times daily with a meal.  Dispense: 35 tablet; Refill: 0  3. Dyslipidemia (high  LDL; low HDL) - His ASCVD 10 year risk was 6.4%, he was advised to continue on low fat/low cholesterol diet and exercise as tolerated. Will recheck- Lipid panel; Future    Follow-up: Return in about 5 weeks (around 03/06/2020), or if symptoms worsen or fail to improve.    Rafaelita Foister Jerold Coombe, NP

## 2020-02-01 NOTE — Patient Instructions (Signed)
Dyslipidemia Dyslipidemia is an imbalance of waxy, fat-like substances (lipids) in the blood. The body needs lipids in small amounts. Dyslipidemia often involves a high level of cholesterol or triglycerides, which are types of lipids. Common forms of dyslipidemia include:  High levels of LDL cholesterol. LDL is the type of cholesterol that causes fatty deposits (plaques) to build up in the blood vessels that carry blood away from your heart (arteries).  Low levels of HDL cholesterol. HDL cholesterol is the type of cholesterol that protects against heart disease. High levels of HDL remove the LDL buildup from arteries.  High levels of triglycerides. Triglycerides are a fatty substance in the blood that is linked to a buildup of plaques in the arteries. What are the causes? Primary dyslipidemia is caused by changes (mutations) in genes that are passed down through families (inherited). These mutations cause several types of dyslipidemia. Secondary dyslipidemia is caused by lifestyle choices and diseases that lead to dyslipidemia, such as:  Eating a diet that is high in animal fat.  Not getting enough exercise.  Having diabetes, kidney disease, liver disease, or thyroid disease.  Drinking large amounts of alcohol.  Using certain medicines. What increases the risk? You are more likely to develop this condition if you are an older man or if you are a woman who has gone through menopause. Other risk factors include:  Having a family history of dyslipidemia.  Taking certain medicines, including birth control pills, steroids, some diuretics, and beta-blockers.  Smoking cigarettes.  Eating a high-fat diet.  Having certain medical conditions such as diabetes, polycystic ovary syndrome (PCOS), kidney disease, liver disease, or hypothyroidism.  Not exercising regularly.  Being overweight or obese with too much belly fat. What are the signs or symptoms? In most cases, dyslipidemia does not  usually cause any symptoms. In severe cases, very high lipid levels can cause:  Fatty bumps under the skin (xanthomas).  White or gray ring around the black center (pupil) of the eye. Very high triglyceride levels can cause inflammation of the pancreas (pancreatitis). How is this diagnosed? Your health care provider may diagnose dyslipidemia based on a routine blood test (fasting blood test). Because most people do not have symptoms of the condition, this blood testing (lipid profile) is done on adults age 81 and older and is repeated every 5 years. This test checks:  Total cholesterol. This measures the total amount of cholesterol in your blood, including LDL cholesterol, HDL cholesterol, and triglycerides. A healthy number is below 200.  LDL cholesterol. The target number for LDL cholesterol is different for each person, depending on individual risk factors. Ask your health care provider what your LDL cholesterol should be.  HDL cholesterol. An HDL level of 60 or higher is best because it helps to protect against heart disease. A number below 45 for men or below 64 for women increases the risk for heart disease.  Triglycerides. A healthy triglyceride number is below 150. If your lipid profile is abnormal, your health care provider may do other blood tests. How is this treated? Treatment depends on the type of dyslipidemia that you have and your other risk factors for heart disease and stroke. Your health care provider will have a target range for your lipid levels based on this information. For many people, this condition may be treated by lifestyle changes, such as diet and exercise. Your health care provider may recommend that you:  Get regular exercise.  Make changes to your diet.  Quit smoking if you  smoke. If diet changes and exercise do not help you reach your goals, your health care provider may also prescribe medicine to lower lipids. The most commonly prescribed type of medicine  lowers your LDL cholesterol (statin drug). If you have a high triglyceride level, your provider may prescribe another type of drug (fibrate) or an omega-3 fish oil supplement, or both. Follow these instructions at home:  Eating and drinking  Follow instructions from your health care provider or dietitian about eating or drinking restrictions.  Eat a healthy diet as told by your health care provider. This can help you reach and maintain a healthy weight, lower your LDL cholesterol, and raise your HDL cholesterol. This may include: ? Limiting your calories, if you are overweight. ? Eating more fruits, vegetables, whole grains, fish, and lean meats. ? Limiting saturated fat, trans fat, and cholesterol.  If you drink alcohol: ? Limit how much you use. ? Be aware of how much alcohol is in your drink. In the [place] one drink equals one 12 oz bottle of beer (355 mL), one 5 oz glass of wine (148 mL), or one 1 oz glass of hard liquor (44 mL).  Do not drink alcohol if: ? Your health care provider tells you not to drink. ? You are pregnant, may be pregnant, or are planning to become pregnant. Activity  Get regular exercise. Start an exercise and strength training program as told by your health care provider. Ask your health care provider what activities are safe for you. Your health care provider may recommend: ? 30 minutes of aerobic activity 4-6 days a week. Brisk walking is an example of aerobic activity. ? Strength training 2 days a week. General instructions  Do not use any products that contain nicotine or tobacco, such as cigarettes, e-cigarettes, and chewing tobacco. If you need help quitting, ask your health care provider.  Take over-the-counter and prescription medicines only as told by your health care provider. This includes supplements.  Keep all follow-up visits as told by your health care provider. Contact a health care provider if:  You are: ? Having trouble sticking to your  exercise or diet plan. ? Struggling to quit smoking or control your use of alcohol. Summary  Dyslipidemia often involves a high level of cholesterol or triglycerides, which are types of lipids.  Treatment depends on the type of dyslipidemia that you have and your other risk factors for heart disease and stroke.  For many people, treatment starts with lifestyle changes, such as diet and exercise.  Your health care provider may prescribe medicine to lower lipids. This information is not intended to replace advice given to you by your health care provider. Make sure you discuss any questions you have with your health care provider. Document Revised: 06/13/2018 Document Reviewed: 05/20/2018 Elsevier Patient Education  [place].  Preventing Type 2 Diabetes Mellitus Type 2 diabetes (type 2 diabetes mellitus) is a long-term (chronic) disease that affects blood sugar (glucose) levels. Normally, a hormone called insulin allows glucose to enter cells in the body. The cells use glucose for energy. In type 2 diabetes, one or both of these problems may be present:  The body does not make enough insulin.  The body does not respond properly to insulin that it makes (insulin resistance). Insulin resistance or lack of insulin causes excess glucose to build up in the blood instead of going into cells. As a result, high blood glucose (hyperglycemia) develops, which can cause many complications. Being overweight or  obese and having an inactive (sedentary) lifestyle can increase your risk for diabetes. Type 2 diabetes can be delayed or prevented by making certain nutrition and lifestyle changes. What nutrition changes can be made?   Eat healthy meals and snacks regularly. Keep a healthy snack with you for when you get hungry between meals, such as fruit or a handful of nuts.  Eat lean meats and proteins that are low in saturated fats, such as chicken, fish, egg whites, and beans. Avoid processed  meats.  Eat plenty of fruits and vegetables and plenty of grains that have not been processed (whole grains). It is recommended that you eat: ? 1?2 cups of fruit every day. ? 2?3 cups of vegetables every day. ? 6?8 oz of whole grains every day, such as oats, whole wheat, bulgur, brown rice, quinoa, and millet.  Eat low-fat dairy products, such as milk, yogurt, and cheese.  Eat foods that contain healthy fats, such as nuts, avocado, olive oil, and canola oil.  Drink water throughout the day. Avoid drinks that contain added sugar, such as soda or sweet tea.  Follow instructions from your health care provider about specific eating or drinking restrictions.  Control how much food you eat at a time (portion size). ? Check food labels to find out the serving sizes of foods. ? Use a kitchen scale to weigh amounts of foods.  Saute or steam food instead of frying it. Cook with water or broth instead of oils or butter.  Limit your intake of: ? Salt (sodium). Have no more than 1 tsp (2,400 mg) of sodium a day. If you have heart disease or high blood pressure, have less than ? tsp (1,500 mg) of sodium a day. ? Saturated fat. This is fat that is solid at room temperature, such as butter or fat on meat. What lifestyle changes can be made? Activity   Do moderate-intensity physical activity for at least 30 minutes on at least 5 days of the week, or as much as told by your health care provider.  Ask your health care provider what activities are safe for you. A mix of physical activities may be best, such as walking, swimming, cycling, and strength training.  Try to add physical activity into your day. For example: ? Park in spots that are farther away than usual, so that you walk more. For example, park in a far corner of the parking lot when you go to the office or the grocery store. ? Take a walk during your lunch break. ? Use stairs instead of elevators or escalators. Weight Loss  Lose  weight as directed. Your health care provider can determine how much weight loss is best for you and can help you lose weight safely.  If you are overweight or obese, you may be instructed to lose at least 5?7 % of your body weight. Alcohol and Tobacco   Limit alcohol intake to no more than 1 drink a day for nonpregnant women and 2 drinks a day for men. One drink equals 12 oz of beer, 5 oz of wine, or 1 oz of hard liquor.  Do not use any tobacco products, such as cigarettes, chewing tobacco, and e-cigarettes. If you need help quitting, ask your health care provider. Work With [facility] Provider  Have your blood glucose tested regularly, as told by your health care provider.  Discuss your risk factors and how you can reduce your risk for diabetes.  Get screening tests as told by  your health care provider. You may have screening tests regularly, especially if you have certain risk factors for type 2 diabetes.  Make an appointment with a diet and nutrition specialist (registered dietitian). A registered dietitian can help you make a healthy eating plan and can help you understand portion sizes and food labels. Why are these changes important?  It is possible to prevent or delay type 2 diabetes and related health problems by making lifestyle and nutrition changes.  It can be difficult to recognize signs of type 2 diabetes. The best way to avoid possible damage to your body is to take actions to prevent the disease before you develop symptoms. What can happen if changes are not made?  Your blood glucose levels may keep increasing. Having high blood glucose for a long time is dangerous. Too much glucose in your blood can damage your blood vessels, heart, kidneys, nerves, and eyes.  You may develop prediabetes or type 2 diabetes. Type 2 diabetes can lead to many chronic health problems and complications, such as: ? Heart disease. ? Stroke. ? Blindness. ? Kidney  disease. ? Depression. ? Poor circulation in the feet and legs, which could lead to surgical removal (amputation) in severe cases. Where to find support  Ask your health care provider to recommend a registered dietitian, diabetes educator, or weight loss program.  Look for local or online weight loss groups.  Join a gym, fitness club, or outdoor activity group, such as a walking club. Where to find more information To learn more about diabetes and diabetes prevention, visit:  American Diabetes Association (ADA): www.diabetes.[employer] of Diabetes and Digestive and Kidney Diseases: FindSpin.nl To learn more about healthy eating, visit:  The U.S. Department of Agriculture Scientist, research (physical sciences)), Choose My Plate: http://wiley-williams.com/  Office of Disease Prevention and Health Promotion (ODPHP), Dietary Guidelines: SurferLive.at Summary  You can reduce your risk for type 2 diabetes by increasing your physical activity, eating healthy foods, and losing weight as directed.  Talk with your health care provider about your risk for type 2 diabetes. Ask about any blood tests or screening tests that you need to have. This information is not intended to replace advice given to you by your health care provider. Make sure you discuss any questions you have with your health care provider. Document Revised: 02/10/2019 Document Reviewed: 12/10/2015 Elsevier Patient Education  [place].

## 2020-02-28 ENCOUNTER — Other Ambulatory Visit: Payer: Self-pay

## 2020-02-28 VITALS — BP 112/80 | HR 81 | Temp 98.2°F | Ht 67.0 in | Wt 201.1 lb

## 2020-02-28 DIAGNOSIS — E785 Hyperlipidemia, unspecified: Secondary | ICD-10-CM

## 2020-02-28 DIAGNOSIS — R7303 Prediabetes: Secondary | ICD-10-CM

## 2020-02-28 DIAGNOSIS — Z0000 Encounter for general adult medical examination without abnormal findings: Secondary | ICD-10-CM

## 2020-02-29 LAB — TSH: TSH: 0.891 u[IU]/mL (ref 0.450–4.500)

## 2020-02-29 LAB — HEMOGLOBIN A1C
Est. average glucose Bld gHb Est-mCnc: 160 mg/dL
Hgb A1c MFr Bld: 7.2 % — ABNORMAL HIGH (ref 4.8–5.6)

## 2020-02-29 LAB — LIPID PANEL
Chol/HDL Ratio: 7.1 ratio — ABNORMAL HIGH (ref 0.0–5.0)
Cholesterol, Total: 193 mg/dL (ref 100–199)
HDL: 27 mg/dL — ABNORMAL LOW (ref >39)
LDL Chol Calc (NIH): 141 mg/dL — ABNORMAL HIGH (ref 0–99)
Triglycerides: 136 mg/dL (ref 0–149)
VLDL Cholesterol Cal: 25 mg/dL (ref 5–40)

## 2020-03-12 ENCOUNTER — Encounter: Payer: Self-pay | Admitting: Gerontology

## 2020-03-12 ENCOUNTER — Ambulatory Visit: Payer: Self-pay | Admitting: Gerontology

## 2020-03-12 ENCOUNTER — Other Ambulatory Visit: Payer: Self-pay

## 2020-03-12 VITALS — BP 131/97 | HR 74 | Temp 97.0°F | Ht 68.0 in | Wt 197.0 lb

## 2020-03-12 DIAGNOSIS — G4733 Obstructive sleep apnea (adult) (pediatric): Secondary | ICD-10-CM | POA: Insufficient documentation

## 2020-03-12 DIAGNOSIS — R0683 Snoring: Secondary | ICD-10-CM | POA: Insufficient documentation

## 2020-03-12 DIAGNOSIS — G4730 Sleep apnea, unspecified: Secondary | ICD-10-CM | POA: Insufficient documentation

## 2020-03-12 DIAGNOSIS — E785 Hyperlipidemia, unspecified: Secondary | ICD-10-CM

## 2020-03-12 DIAGNOSIS — I10 Essential (primary) hypertension: Secondary | ICD-10-CM

## 2020-03-12 DIAGNOSIS — E119 Type 2 diabetes mellitus without complications: Secondary | ICD-10-CM | POA: Insufficient documentation

## 2020-03-12 MED ORDER — METFORMIN HCL 850 MG PO TABS: 850.0000 mg | ORAL_TABLET | Freq: Two times a day (BID) | ORAL | 2 refills | Status: DC

## 2020-03-12 MED ORDER — PRAVASTATIN SODIUM 10 MG PO TABS: 10.0000 mg | ORAL_TABLET | Freq: Every day | ORAL | 0 refills | Status: DC

## 2020-03-12 NOTE — Patient Instructions (Addendum)
Preventing High Cholesterol Cholesterol is a white, waxy substance similar to fat that the human body needs to help build cells. The liver makes all the cholesterol that a person's body needs. Having high cholesterol (hypercholesterolemia) increases a person's risk for heart disease and stroke. Extra (excess) cholesterol comes from the food the person eats. High cholesterol can often be prevented with diet and lifestyle changes. If you already have high cholesterol, you can control it with diet and lifestyle changes and with medicine. How can high cholesterol affect me? If you have high cholesterol, deposits (plaques) may build up on the walls of your arteries. The arteries are the blood vessels that carry blood away from your heart. Plaques make the arteries narrower and stiffer. This can limit or block blood flow and cause blood clots to form. Blood clots:  Are tiny balls of cells that form in your blood.  Can move to the heart or brain, causing a heart attack or stroke. Plaques in arteries greatly increase your risk for heart attack and stroke.Making diet and lifestyle changes can reduce your risk for these conditions that may threaten your life. What can increase my risk? This condition is more likely to develop in people who:  Eat foods that are high in saturated fat or cholesterol. Saturated fat is mostly found in: ? Foods that contain animal fat, such as red meat and some dairy products. ? Certain fatty foods made from plants, such as tropical oils.  Are overweight.  Are not getting enough exercise.  Have a family history of high cholesterol. What actions can I take to prevent this? Nutrition   Eat less saturated fat.  Avoid trans fats (partially hydrogenated oils). These are often found in margarine and in some baked goods, fried foods, and snacks bought in packages.  Avoid precooked or cured meat, such as sausages or meat loaves.  Avoid foods and drinks that have added  sugars.  Eat more fruits, vegetables, and whole grains.  Choose healthy sources of protein, such as fish, poultry, lean cuts of red meat, beans, peas, lentils, and nuts.  Choose healthy sources of fat, such as: ? Nuts. ? Vegetable oils, especially olive oil. ? Fish that have healthy fats (omega-3 fatty acids), such as mackerel or salmon. The items listed above may not be a complete list of recommended foods and beverages. Contact a dietitian for more information. Lifestyle  Lose weight if you are overweight. Losing 5-10 lb (2.3-4.5 kg) can help prevent or control high cholesterol. It can also lower your risk for diabetes and high blood pressure. Ask your health care provider to help you with a diet and exercise plan to lose weight safely.  Do not use any products that contain nicotine or tobacco, such as cigarettes, e-cigarettes, and chewing tobacco. If you need help quitting, ask your health care provider.  Limit your alcohol intake. ? Do not drink alcohol if:  Your health care provider tells you not to drink.  You are pregnant, may be pregnant, or are planning to become pregnant. ? If you drink alcohol:  Limit how much you use to:  0-1 drink a day for women.  0-2 drinks a day for men.  Be aware of how much alcohol is in your drink. In the [place] one drink equals one 12 oz bottle of beer (355 mL), one 5 oz glass of wine (148 mL), or one 1 oz glass of hard liquor (44 mL). Activity   Get enough exercise. Each week, do at   least 150 minutes of exercise that takes a medium level of effort (moderate-intensity exercise). ? This is exercise that:  Makes your heart beat faster and makes you breathe harder than usual.  Allows you to still be able to talk. ? You could exercise in short sessions several times a day or longer sessions a few times a week. For example, on 5 days each week, you could walk fast or ride your bike 3 times a day for 10 minutes each time.  Do exercises as told  by your health care provider. Medicines  In addition to diet and lifestyle changes, your health care provider may recommend medicines to help lower cholesterol. This may be a medicine to lower the amount of cholesterol your liver makes. You may need medicine if: ? Diet and lifestyle changes do not lower your cholesterol enough. ? You have high cholesterol and other risk factors for heart disease or stroke.  Take over-the-counter and prescription medicines only as told by your health care provider. General information  Manage your risk factors for high cholesterol. Talk with your health care provider about all your risk factors and how to lower your risk.  Manage other conditions that you have, such as diabetes or high blood pressure (hypertension).  Have blood tests to check your cholesterol levels at regular points in time as told by your health care provider.  Keep all follow-up visits as told by your health care provider. This is important. Where to find more information  American Heart Association: www.heart.org  National Heart, Lung, and Blood Institute: https://wilson-eaton.com/ Summary  High cholesterol increases your risk for heart disease and stroke. By keeping your cholesterol level low, you can reduce your risk for these conditions.  High cholesterol can often be prevented with diet and lifestyle changes.  Work with your health care provider to manage your risk factors, and have your blood tested regularly. This information is not intended to replace advice given to you by your health care provider. Make sure you discuss any questions you have with your health care provider. Document Revised: 02/10/2019 Document Reviewed: 06/27/2016 Elsevier Patient Education  [place] and Cholesterol Restricted Eating Plan Getting too much fat and cholesterol in your diet may cause health problems. Choosing the right foods helps keep your fat and cholesterol at normal levels.  This can keep you from getting certain diseases. Your doctor may recommend an eating plan that includes:  Total fat: ______% or less of total calories a day.  Saturated fat: ______% or less of total calories a day.  Cholesterol: less than _________mg a day.  Fiber: ______g a day. What are tips for following this plan? Meal planning  At meals, divide your plate into four equal parts: ? Fill one-half of your plate with vegetables and green salads. ? Fill one-fourth of your plate with whole grains. ? Fill one-fourth of your plate with low-fat (lean) protein foods.  Eat fish that is high in omega-3 fats at least two times a week. This includes mackerel, tuna, sardines, and salmon.  Eat foods that are high in fiber, such as whole grains, beans, apples, broccoli, carrots, peas, and barley. General tips   Work with your doctor to lose weight if you need to.  Avoid: ? Foods with added sugar. ? Fried foods. ? Foods with partially hydrogenated oils.  Limit alcohol intake to no more than 1 drink a day for nonpregnant women and 2 drinks a day for men. One drink equals 12  oz of beer, 5 oz of wine, or 1 oz of hard liquor. Reading food labels  Check food labels for: ? Trans fats. ? Partially hydrogenated oils. ? Saturated fat (g) in each serving. ? Cholesterol (mg) in each serving. ? Fiber (g) in each serving.  Choose foods with healthy fats, such as: ? Monounsaturated fats. ? Polyunsaturated fats. ? Omega-3 fats.  Choose grain products that have whole grains. Look for the word "whole" as the first word in the ingredient list. Cooking  Cook foods using low-fat methods. These include baking, boiling, grilling, and broiling.  Eat more home-cooked foods. Eat at restaurants and buffets less often.  Avoid cooking using saturated fats, such as butter, cream, palm oil, palm kernel oil, and coconut oil. Recommended foods  Fruits  All fresh, canned (in natural juice), or frozen  fruits. Vegetables  Fresh or frozen vegetables (raw, steamed, roasted, or grilled). Green salads. Grains  Whole grains, such as whole wheat or whole grain breads, crackers, cereals, and pasta. Unsweetened oatmeal, bulgur, barley, quinoa, or brown rice. Corn or whole wheat flour tortillas. Meats and other protein foods  Ground beef (85% or leaner), grass-fed beef, or beef trimmed of fat. Skinless chicken or [place]. Ground chicken or [place]. Pork trimmed of fat. All fish and seafood. Egg whites. Dried beans, peas, or lentils. Unsalted nuts or seeds. Unsalted canned beans. Nut butters without added sugar or oil. Dairy  Low-fat or nonfat dairy products, such as skim or 1% milk, 2% or reduced-fat cheeses, low-fat and fat-free ricotta or cottage cheese, or plain low-fat and nonfat yogurt. Fats and oils  Tub margarine without trans fats. Light or reduced-fat mayonnaise and salad dressings. Avocado. Olive, canola, sesame, or safflower oils. The items listed above may not be a complete list of foods and beverages you can eat. Contact a dietitian for more information. Foods to avoid Fruits  Canned fruit in heavy syrup. Fruit in cream or butter sauce. Fried fruit. Vegetables  Vegetables cooked in cheese, cream, or butter sauce. Fried vegetables. Grains  White bread. White pasta. White rice. Cornbread. Bagels, pastries, and croissants. Crackers and snack foods that contain trans fat and hydrogenated oils. Meats and other protein foods  Fatty cuts of meat. Ribs, chicken wings, bacon, sausage, bologna, salami, chitterlings, fatback, hot dogs, bratwurst, and packaged lunch meats. Liver and organ meats. Whole eggs and egg yolks. Chicken and [place] with skin. Fried meat. Dairy  Whole or 2% milk, cream, half-and-half, and cream cheese. Whole milk cheeses. Whole-fat or sweetened yogurt. Full-fat cheeses. Nondairy creamers and whipped toppings. Processed cheese, cheese spreads, and cheese  curds. Beverages  Alcohol. Sugar-sweetened drinks such as sodas, lemonade, and fruit drinks. Fats and oils  Butter, stick margarine, lard, shortening, ghee, or bacon fat. Coconut, palm kernel, and palm oils. Sweets and desserts  Corn syrup, sugars, honey, and molasses. Candy. Jam and jelly. Syrup. Sweetened cereals. Cookies, pies, cakes, donuts, muffins, and ice cream. The items listed above may not be a complete list of foods and beverages you should avoid. Contact a dietitian for more information. Summary  Choosing the right foods helps keep your fat and cholesterol at normal levels. This can keep you from getting certain diseases.  At meals, fill one-half of your plate with vegetables and green salads.  Eat high-fiber foods, like whole grains, beans, apples, carrots, peas, and barley.  Limit added sugar, saturated fats, alcohol, and fried foods. This information is not intended to replace advice given to you by your health care  provider. Make sure you discuss any questions you have with your health care provider. Document Revised: 06/22/2018 Document Reviewed: 07/06/2017 Elsevier Patient Education  [place].

## 2020-03-12 NOTE — Progress Notes (Signed)
Established Patient Office Visit  Subjective:  Patient ID: Luke [name], male    DOB: May 07, 1981  Age: 39 y.o. MRN: [id number]  CC:  Chief Complaint  Patient presents with  . Hypertension    HPI Luke [name] presents for follow up of hypertension, prediabetes and lab review. He states that he doesn't check his blood pressure at home but continues on DASH diet and exercises as tolerated. His HgbA1c done on 02/28/2020 increased from 6.4% to 7.2% and he continues to take 500 mg Metformin bid and continues to make healthy life style changes. His blood glucose was 97 mg/dl during his clinic visit. His Lipid panel HDL had one point increase to 27 mg/dl and LDL decreased from 148 mg/dl  to 141 mg/dl, Total cholesterol and Triglycerides are within normal limits. He states that he snores and per patient, experiences apnea during his sleep. Overall, he states that he's doing well and offers no further complaint.  Past Medical History:  Diagnosis Date  . Anemia   . Cancer ([place])    colon ca  . Cecum mass   . Colonic neoplasm   . Diverticulosis of large intestine without diverticulitis   . GERD (gastroesophageal reflux disease)   . Hypertension   . Pneumonia 09/07/2016  . Stomach irritation     Past Surgical History:  Procedure Laterality Date  . COLON SURGERY    . COLONOSCOPY WITH PROPOFOL N/A 07/28/2018   Procedure: COLONOSCOPY WITH PROPOFOL;  Surgeon: Virgel Manifold, MD;  Location: [facility];  Service: Endoscopy;  Laterality: N/A;  . COLONOSCOPY WITH PROPOFOL N/A 10/18/2019   Procedure: COLONOSCOPY WITH PROPOFOL;  Surgeon: Virgel Manifold, MD;  Location: [facility] END[facility]COPY;  Service: Endoscopy;  Laterality: N/A;  . ESOPHAGOGASTRODUODENOSCOPY (EGD) WITH PROPOFOL N/A 07/28/2018   Procedure: ESOPHAGOGASTRODUODENOSCOPY (EGD) WITH PROPOFOL;  Surgeon: Virgel Manifold, MD;  Location: [facility] ENDOSCOPY;  Service: Endoscopy;  Laterality: N/A;  . none    . PARTIAL COLECTOMY Right  08/15/2018   Procedure: RIGHT  COLECTOMY;  Surgeon: Vickie Epley, MD;  Location: [facility] ORS;  Service: General;  Laterality: Right;    Family History  Problem Relation Age of Onset  . CAD Maternal Grandmother   . Breast cancer Maternal Grandmother   . Breast cancer Mother   . Hypertension Mother   . Hypertension Father   . Colon cancer Paternal Uncle     Social History   Socioeconomic History  . Marital status: Single    Spouse name: Not on file  . Number of children: Not on file  . Years of education: Not on file  . Highest education level: Not on file  Occupational History  . Not on file  Tobacco Use  . Smoking status: Never Smoker  . Smokeless tobacco: Never Used  . Tobacco comment: patient is social smoker/ smokes when he drinks   Substance and Sexual Activity  . Alcohol use: Yes    Alcohol/week: 1.0 standard drinks    Types: 1 Cans of beer per week    Comment: patient reports he consumes ~2 beers about once per month  . Drug use: Not Currently    Types: Marijuana, Cocaine  . Sexual activity: Yes    Birth control/protection: Condom  Other Topics Concern  . Not on file  Social History Narrative  . Not on file   Social Determinants of Health   Financial Resource Strain:   . Difficulty of Paying Living Expenses:   Food Insecurity:   .  Worried About Charity fundraiser in the Last Year:   . Arboriculturist in the Last Year:   Transportation Needs:   . Film/video editor (Medical):   Marland Kitchen Lack of Transportation (Non-Medical):   Physical Activity:   . Days of Exercise per Week:   . Minutes of Exercise per Session:   Stress:   . Feeling of Stress :   Social Connections:   . Frequency of Communication with Friends and Family:   . Frequency of Social Gatherings with Friends and Family:   . Attends Religious Services:   . Active Member of Clubs or Organizations:   . Attends Archivist Meetings:   Marland Kitchen Marital Status:   Intimate Partner Violence:    . Fear of Current or Ex-Partner:   . Emotionally Abused:   Marland Kitchen Physically Abused:   . Sexually Abused:     Outpatient Medications Prior to Visit  Medication Sig Dispense Refill  . amLODipine (NORVASC) 5 MG tablet Take 1 tablet (5 mg total) by mouth daily. 90 tablet 1  . Cyanocobalamin (VITAMIN B-12 PO) Take 1 tablet by mouth daily.    . IRON PO Take 1 tablet by mouth daily.    . polyethylene glycol (MIRALAX) 17 g packet Take 17 g by mouth daily. 14 each 0  . metFORMIN (GLUCOPHAGE) 1000 MG tablet Take 0.5 tablets (500 mg total) by mouth 2 (two) times daily with a meal. 35 tablet 0   No facility-administered medications prior to visit.    No Known Allergies  ROS Review of Systems  Constitutional: Negative.   Eyes: Negative.   Respiratory: Negative.   Cardiovascular: Negative.   Endocrine: Negative.   Neurological: Negative.   Psychiatric/Behavioral: Negative.       Objective:    Physical Exam  Constitutional: He is oriented to person, place, and time. He appears well-developed.  HENT:  Head: Normocephalic and atraumatic.  Eyes: Pupils are equal, round, and reactive to light. EOM are normal.  Cardiovascular: Normal rate and regular rhythm.  Pulmonary/Chest: Effort normal and breath sounds normal.  Neurological: He is alert and oriented to person, place, and time.  Psychiatric: He has a normal mood and affect. His behavior is normal. Judgment and thought content normal.    BP (!) 131/97 (BP Location: Left Arm, Patient Position: Sitting)   Pulse 74   Temp (!) 97 F (36.1 C)   Ht '5\' 8"'$  (1.727 m)   Wt 197 lb (89.4 kg)   SpO2 98%   BMI 29.95 kg/m  Wt Readings from Last 3 Encounters:  03/12/20 197 lb (89.4 kg)  02/28/20 201 lb 1.6 oz (91.2 kg)  02/01/20 203 lb (92.1 kg)     Health Maintenance Due  Topic Date Due  . PNEUMOCOCCAL POLYSACCHARIDE VACCINE AGE 78-64 HIGH RISK  Never done  . FOOT EXAM  Never done  . OPHTHALMOLOGY EXAM  Never done  . TETANUS/TDAP  Never  done    There are no preventive care reminders to display for this patient.  Lab Results  Component Value Date   TSH 0.891 02/28/2020   Lab Results  Component Value Date   WBC 6.3 11/15/2019   HGB 16.6 11/15/2019   HCT 50.3 11/15/2019   MCV 87 11/15/2019   PLT 196 11/15/2019   Lab Results  Component Value Date   NA 139 05/11/2019   K 3.8 05/11/2019   CO2 22 05/11/2019   GLUCOSE 106 (H) 05/11/2019   BUN 9 05/11/2019  CREATININE 1.17 05/11/2019   BILITOT 0.6 05/11/2019   ALKPHOS 56 05/11/2019   AST 34 05/11/2019   ALT 39 05/11/2019   PROT 7.0 05/11/2019   ALBUMIN 3.8 05/11/2019   CALCIUM 8.9 05/11/2019   ANIONGAP 11 05/11/2019   Lab Results  Component Value Date   CHOL 193 02/28/2020   Lab Results  Component Value Date   HDL 27 (L) 02/28/2020   Lab Results  Component Value Date   LDLCALC 141 (H) 02/28/2020   Lab Results  Component Value Date   TRIG 136 02/28/2020   Lab Results  Component Value Date   CHOLHDL 7.1 (H) 02/28/2020   Lab Results  Component Value Date   HGBA1C 7.2 (H) 02/28/2020      Assessment & Plan:   1. Essential hypertension - His blood pressure is under control and he will continue on current medication, DASH diet and exercises as tolerated.  2. Dyslipidemia (high LDL; low HDL) - His ASCVD 10 year risk is 19.6%, he will start on Pravastatin 10 mg, was educated on medication side effects and advised to notify clinic. -Low fat Diet, like low fat dairy products eg skimmed milk -Avoid any fried food -Regular exercise/walk -Goal for Total Cholesterol is less than 200 -Goal for bad cholesterol LDL is less than 70 -Goal for Good cholesterol HDL is more than 45 -Goal for Triglyceride is less than 150 - pravastatin (PRAVACHOL) 10 MG tablet; Take 1 tablet (10 mg total) by mouth daily.  Dispense: 30 tablet; Refill: 0 - Comp Met (CMET); Future  3. Type 2 diabetes mellitus without complication, without long-term current use of insulin  (HCC) - HIS HgbA1c was 7.2%. His Metformin was increased to 850 mg bid, he was advised to continue on low carb diet/ non concentrated sweet diet and exercise as tolerated. - metFORMIN (GLUCOPHAGE) 850 MG tablet; Take 1 tablet (850 mg total) by mouth 2 (two) times daily with a meal.  Dispense: 60 tablet; Refill: 2  4. Snores - He was advised to complete [facility] financial assistance for sleep study to rule out Sleep apnea. - PSG Sleep Study; Future     Follow-up: Return in about 22 days (around 04/03/2020), or if symptoms worsen or fail to improve.    Zahriah Roes Jerold Coombe, NP

## 2020-03-14 ENCOUNTER — Telehealth: Payer: Self-pay | Admitting: Gerontology

## 2020-03-14 NOTE — Telephone Encounter (Signed)
Luke [name] returned our call and I was able to explain to him the eligibility update requirements. He stated he will stop by today (5/13) to pick up the paperwork rather than having it mailed

## 2020-03-14 NOTE — Telephone Encounter (Signed)
Pt was called to notify him of missing eligibility paperwork that is due at his next appt. I was unable to speak with him and the vm box was full

## 2020-03-27 ENCOUNTER — Other Ambulatory Visit: Payer: Self-pay

## 2020-03-27 DIAGNOSIS — E785 Hyperlipidemia, unspecified: Secondary | ICD-10-CM

## 2020-03-28 LAB — COMPREHENSIVE METABOLIC PANEL
ALT: 22 IU/L (ref 0–44)
AST: 21 IU/L (ref 0–40)
Albumin/Globulin Ratio: 1.6 (ref 1.2–2.2)
Albumin: 4.2 g/dL (ref 4.0–5.0)
Alkaline Phosphatase: 86 IU/L (ref 48–121)
BUN/Creatinine Ratio: 7 — ABNORMAL LOW (ref 9–20)
BUN: 9 mg/dL (ref 6–24)
Bilirubin Total: 0.5 mg/dL (ref 0.0–1.2)
CO2: 20 mmol/L (ref 20–29)
Calcium: 9.8 mg/dL (ref 8.7–10.2)
Chloride: 106 mmol/L (ref 96–106)
Creatinine, Ser: 1.23 mg/dL (ref 0.76–1.27)
GFR calc Af Amer: 84 mL/min/{1.73_m2} (ref >59)
GFR calc non Af Amer: 73 mL/min/{1.73_m2} (ref >59)
Globulin, Total: 2.7 g/dL (ref 1.5–4.5)
Glucose: 195 mg/dL — ABNORMAL HIGH (ref 65–99)
Potassium: 4.2 mmol/L (ref 3.5–5.2)
Sodium: 141 mmol/L (ref 134–144)
Total Protein: 6.9 g/dL (ref 6.0–8.5)

## 2020-04-04 ENCOUNTER — Encounter: Payer: Self-pay | Admitting: Gerontology

## 2020-04-04 ENCOUNTER — Other Ambulatory Visit: Payer: Self-pay

## 2020-04-04 ENCOUNTER — Ambulatory Visit: Payer: Self-pay | Admitting: Gerontology

## 2020-04-04 VITALS — BP 125/84 | HR 83 | Ht 68.0 in | Wt 201.7 lb

## 2020-04-04 DIAGNOSIS — E785 Hyperlipidemia, unspecified: Secondary | ICD-10-CM

## 2020-04-04 DIAGNOSIS — E1140 Type 2 diabetes mellitus with diabetic neuropathy, unspecified: Secondary | ICD-10-CM

## 2020-04-04 DIAGNOSIS — I10 Essential (primary) hypertension: Secondary | ICD-10-CM

## 2020-04-04 MED ORDER — AMLODIPINE BESYLATE 5 MG PO TABS: 5.0000 mg | ORAL_TABLET | Freq: Every day | ORAL | 1 refills | Status: DC

## 2020-04-04 MED ORDER — BLOOD GLUCOSE MONITOR KIT: PACK | 0 refills | Status: AC

## 2020-04-04 MED ORDER — GABAPENTIN 100 MG PO CAPS: 100.0000 mg | ORAL_CAPSULE | Freq: Every day | ORAL | 0 refills | Status: DC

## 2020-04-04 MED ORDER — PRAVASTATIN SODIUM 10 MG PO TABS: 10.0000 mg | ORAL_TABLET | Freq: Every day | ORAL | 0 refills | Status: DC

## 2020-04-04 NOTE — Progress Notes (Signed)
Established Patient Office Visit  Subjective:  Patient ID: Luke [name], male    DOB: 1981/01/10  Age: 39 y.o. MRN: [id number]  CC:  Chief Complaint  Patient presents with  . Hypertension  . Diabetes    HPI Luke [name] is a 39 y. o . Male who presents for follow up hypertension, type 2 diabetes mellitus and hyperlipidemia. His recent HgbA1c which was completed on 02/28/2020 was 7.2%.He reports  Being compliant with his treatment regimen. His LDL decreased from 134m/dL to 1453mdL, while his  HDL increased from 2663mL to 12m23m. His POCT blood sugar 04/04/20 was recorded as 158 mg/dL.  He complained of bilateral numbness which has been on-going. He also reports bilateral diminished sensation in his lower extremities. He denies cough, fever, SOB or chest tightness. He verbalized working on making positive life style changes. Overall, he states that he is doing well and offers no additional compliant.    Past Medical History:  Diagnosis Date  . Anemia   . Cancer (HCC)[place] colon ca  . Cecum mass   . Colonic neoplasm   . Diverticulosis of large intestine without diverticulitis   . GERD (gastroesophageal reflux disease)   . Hypertension   . Pneumonia 09/07/2016  . Stomach irritation     Past Surgical History:  Procedure Laterality Date  . COLON SURGERY    . COLONOSCOPY WITH PROPOFOL N/A 07/28/2018   Procedure: COLONOSCOPY WITH PROPOFOL;  Surgeon: TahiVirgel Manifold;  Location: [facility];  Service: Endoscopy;  Laterality: N/A;  . COLONOSCOPY WITH PROPOFOL N/A 10/18/2019   Procedure: COLONOSCOPY WITH PROPOFOL;  Surgeon: TahiVirgel Manifold;  Location: [facility];  Service: Endoscopy;  Laterality: N/A;  . ESOPHAGOGASTRODUODENOSCOPY (EGD) WITH PROPOFOL N/A 07/28/2018   Procedure: ESOPHAGOGASTRODUODENOSCOPY (EGD) WITH PROPOFOL;  Surgeon: TahiVirgel Manifold;  Location: [facility];  Service: Endoscopy;  Laterality: N/A;  . none    . PARTIAL COLECTOMY  Right 08/15/2018   Procedure: RIGHT  COLECTOMY;  Surgeon: DaviVickie Epley;  Location: [facility] ORS;  Service: General;  Laterality: Right;    Family History  Problem Relation Age of Onset  . CAD Maternal Grandmother   . Breast cancer Maternal Grandmother   . Breast cancer Mother   . Hypertension Mother   . Hypertension Father   . Colon cancer Paternal Uncle     Social History   Socioeconomic History  . Marital status: Single    Spouse name: Not on file  . Number of children: Not on file  . Years of education: Not on file  . Highest education level: Not on file  Occupational History  . Not on file  Tobacco Use  . Smoking status: Never Smoker  . Smokeless tobacco: Never Used  . Tobacco comment: patient is social smoker/ smokes when he drinks   Substance and Sexual Activity  . Alcohol use: Yes    Alcohol/week: 1.0 standard drinks    Types: 1 Cans of beer per week    Comment: patient reports he consumes ~2 beers about once per month  . Drug use: Not Currently    Types: Marijuana, Cocaine  . Sexual activity: Yes    Birth control/protection: Condom  Other Topics Concern  . Not on file  Social History Narrative  . Not on file   Social Determinants of Health   Financial Resource Strain:   . Difficulty of Paying Living Expenses:   Food Insecurity:   . Worried About Running  Out of Food in the Last Year:   . [place] in the Last Year:   Transportation Needs:   . Lack of Transportation (Medical):   Marland Kitchen Lack of Transportation (Non-Medical):   Physical Activity:   . Days of Exercise per Week:   . Minutes of Exercise per Session:   Stress:   . Feeling of Stress :   Social Connections:   . Frequency of Communication with Friends and Family:   . Frequency of Social Gatherings with Friends and Family:   . Attends Religious Services:   . Active Member of Clubs or Organizations:   . Attends Archivist Meetings:   Marland Kitchen Marital Status:   Intimate Partner  Violence:   . Fear of Current or Ex-Partner:   . Emotionally Abused:   Marland Kitchen Physically Abused:   . Sexually Abused:     Outpatient Medications Prior to Visit  Medication Sig Dispense Refill  . Cyanocobalamin (VITAMIN B-12 PO) Take 1 tablet by mouth daily.    . IRON PO Take 1 tablet by mouth daily.    . metFORMIN (GLUCOPHAGE) 850 MG tablet Take 1 tablet (850 mg total) by mouth 2 (two) times daily with a meal. 60 tablet 2  . polyethylene glycol (MIRALAX) 17 g packet Take 17 g by mouth daily. 14 each 0  . amLODipine (NORVASC) 5 MG tablet Take 1 tablet (5 mg total) by mouth daily. 90 tablet 1  . pravastatin (PRAVACHOL) 10 MG tablet Take 1 tablet (10 mg total) by mouth daily. 30 tablet 0   No facility-administered medications prior to visit.    No Known Allergies  ROS Review of Systems  Constitutional: Negative.   Eyes: Negative.   Respiratory: Negative.   Cardiovascular: Negative.   Gastrointestinal: Negative.   Endocrine: Negative.   Neurological: Positive for numbness (peripheral neuropathy).  Psychiatric/Behavioral: Negative.       Objective:    Physical Exam  Constitutional: He is oriented to person, place, and time.  Eyes: Pupils are equal, round, and reactive to light.  Cardiovascular: Normal rate, regular rhythm, normal heart sounds and intact distal pulses.  Musculoskeletal:        General: Normal range of motion.     Cervical back: Normal range of motion and neck supple.  Neurological: He is alert and oriented to person, place, and time.  Psychiatric: He has a normal mood and affect.    BP 125/84 (BP Location: Right Arm, Patient Position: Sitting)   Pulse 83   Ht '5\' 8"'  (1.727 m)   Wt 201 lb 11.2 oz (91.5 kg)   SpO2 97%   BMI 30.67 kg/m  Wt Readings from Last 3 Encounters:  04/04/20 201 lb 11.2 oz (91.5 kg)  03/27/20 198 lb 9.6 oz (90.1 kg)  03/12/20 197 lb (89.4 kg)   His is encouraged to continue on his weight loss regimen, in addition to decreasing his  intake of concentrated sweets and sugars.   Health Maintenance Due  Topic Date Due  . PNEUMOCOCCAL POLYSACCHARIDE VACCINE AGE 40-64 HIGH RISK  Never done  . FOOT EXAM  Never done  . OPHTHALMOLOGY EXAM  Never done  . URINE MICROALBUMIN  Never done  . COVID-19 Vaccine (1) Never done  . TETANUS/TDAP  Never done    There are no preventive care reminders to display for this patient.  Lab Results  Component Value Date   TSH 0.891 02/28/2020   Lab Results  Component Value Date   WBC  6.3 11/15/2019   HGB 16.6 11/15/2019   HCT 50.3 11/15/2019   MCV 87 11/15/2019   PLT 196 11/15/2019   Lab Results  Component Value Date   NA 141 03/27/2020   K 4.2 03/27/2020   CO2 20 03/27/2020   GLUCOSE 195 (H) 03/27/2020   BUN 9 03/27/2020   CREATININE 1.23 03/27/2020   BILITOT 0.5 03/27/2020   ALKPHOS 86 03/27/2020   AST 21 03/27/2020   ALT 22 03/27/2020   PROT 6.9 03/27/2020   ALBUMIN 4.2 03/27/2020   CALCIUM 9.8 03/27/2020   ANIONGAP 11 05/11/2019   Lab Results  Component Value Date   CHOL 193 02/28/2020   Lab Results  Component Value Date   HDL 27 (L) 02/28/2020   Lab Results  Component Value Date   LDLCALC 141 (H) 02/28/2020   Lab Results  Component Value Date   TRIG 136 02/28/2020   Lab Results  Component Value Date   CHOLHDL 7.1 (H) 02/28/2020   Lab Results  Component Value Date   HGBA1C 7.2 (H) 02/28/2020      Assessment & Plan:   1. Essential hypertension His blood pressure is under control. He will continue with medication regimen. He was educated to continue on DASH diet and exercise as tolerated.  - amLODipine (NORVASC) 5 MG tablet; Take 1 tablet (5 mg total) by mouth daily.  Dispense: 90 tablet; Refill: 1  2. Dyslipidemia (high LDL; low HDL) He states that he will pick up medicine today. He was educated on side effects of the medication. He was instructed to notify the clinic of any issues related to new medication. He was also encourage to continue on  low fat, low cholesterol diet and exercise as tolerated.  - pravastatin (PRAVACHOL) 10 MG tablet; Take 1 tablet (10 mg total) by mouth daily.  Dispense: 30 tablet; Refill: 0  3. Type 2 diabetes mellitus with diabetic neuropathy, without long-term current use of insulin (HCC) His HgbA1c was 7.2% with a goal of 7%. He was educated and provided with a glucometer. He was encouraged to measure and document blood sugar daily. He was educated to bring daily log to next scheduled appointment. He was informed that the expected range for his fasting blood sugar should be between 80 -130 mg/dL. He was advised to continue on low car/non concentrated sweet diet and exercise as tolerated.  - He will start on Gabapentin 100 mg for peripheral neuropathy, was educated on medication side effects and advised to notify clinic. gabapentin (NEURONTIN) 100 MG capsule; Take 1 capsule (100 mg total) by mouth at bedtime.  Dispense: 30 capsule; Refill: 0 - blood glucose meter kit and supplies KIT; Dispense based on patient and insurance preference. Use up to four times daily as directed. (FOR ICD-9 250.00, 250.01).  Dispense: 1 each; Refill: 0   Follow-up: Return in about 26 days (around 04/30/2020), or if symptoms worsen or fail to improve.     Carney Corners, RN

## 2020-04-04 NOTE — Patient Instructions (Signed)
Carbohydrate Counting for Diabetes Mellitus, Adult  Carbohydrate counting is a method of keeping track of how many carbohydrates you eat. Eating carbohydrates naturally increases the amount of sugar (glucose) in the blood. Counting how many carbohydrates you eat helps keep your blood glucose within normal limits, which helps you manage your diabetes (diabetes mellitus). It is important to know how many carbohydrates you can safely have in each meal. This is different for every person. A diet and nutrition specialist (registered dietitian) can help you make a meal plan and calculate how many carbohydrates you should have at each meal and snack. Carbohydrates are found in the following foods:  Grains, such as breads and cereals.  Dried beans and soy products.  Starchy vegetables, such as potatoes, peas, and corn.  Fruit and fruit juices.  Milk and yogurt.  Sweets and snack foods, such as cake, cookies, candy, chips, and soft drinks. How do I count carbohydrates? There are two ways to count carbohydrates in food. You can use either of the methods or a combination of both. Reading "Nutrition Facts" on packaged food The "Nutrition Facts" list is included on the labels of almost all packaged foods and beverages in the [place] It includes:  The serving size.  Information about nutrients in each serving, including the grams (g) of carbohydrate per serving. To use the "Nutrition Facts":  Decide how many servings you will have.  Multiply the number of servings by the number of carbohydrates per serving.  The resulting number is the total amount of carbohydrates that you will be having. Learning standard serving sizes of other foods When you eat carbohydrate foods that are not packaged or do not include "Nutrition Facts" on the label, you need to measure the servings in order to count the amount of carbohydrates:  Measure the foods that you will eat with a food scale or measuring cup, if needed.   Decide how many standard-size servings you will eat.  Multiply the number of servings by 15. Most carbohydrate-rich foods have about 15 g of carbohydrates per serving. ? For example, if you eat 8 oz (170 g) of strawberries, you will have eaten 2 servings and 30 g of carbohydrates (2 servings x 15 g = 30 g).  For foods that have more than one food mixed, such as soups and casseroles, you must count the carbohydrates in each food that is included. The following list contains standard serving sizes of common carbohydrate-rich foods. Each of these servings has about 15 g of carbohydrates:   hamburger bun or  English muffin.   oz (15 mL) syrup.   oz (14 g) jelly.  1 slice of bread.  1 six-inch tortilla.  3 oz (85 g) cooked rice or pasta.  4 oz (113 g) cooked dried beans.  4 oz (113 g) starchy vegetable, such as peas, corn, or potatoes.  4 oz (113 g) hot cereal.  4 oz (113 g) mashed potatoes or  of a large baked potato.  4 oz (113 g) canned or frozen fruit.  4 oz (120 mL) fruit juice.  4-6 crackers.  6 chicken nuggets.  6 oz (170 g) unsweetened dry cereal.  6 oz (170 g) plain fat-free yogurt or yogurt sweetened with artificial sweeteners.  8 oz (240 mL) milk.  8 oz (170 g) fresh fruit or one small piece of fruit.  24 oz (680 g) popped popcorn. Example of carbohydrate counting Sample meal  3 oz (85 g) chicken breast.  6 oz (170 g)   brown rice.  4 oz (113 g) corn.  8 oz (240 mL) milk.  8 oz (170 g) strawberries with sugar-free whipped topping. Carbohydrate calculation 1. Identify the foods that contain carbohydrates: ? Rice. ? Corn. ? Milk. ? Strawberries. 2. Calculate how many servings you have of each food: ? 2 servings rice. ? 1 serving corn. ? 1 serving milk. ? 1 serving strawberries. 3. Multiply each number of servings by 15 g: ? 2 servings rice x 15 g = 30 g. ? 1 serving corn x 15 g = 15 g. ? 1 serving milk x 15 g = 15 g. ? 1 serving  strawberries x 15 g = 15 g. 4. Add together all of the amounts to find the total grams of carbohydrates eaten: ? 30 g + 15 g + 15 g + 15 g = 75 g of carbohydrates total. Summary  Carbohydrate counting is a method of keeping track of how many carbohydrates you eat.  Eating carbohydrates naturally increases the amount of sugar (glucose) in the blood.  Counting how many carbohydrates you eat helps keep your blood glucose within normal limits, which helps you manage your diabetes.  A diet and nutrition specialist (registered dietitian) can help you make a meal plan and calculate how many carbohydrates you should have at each meal and snack. This information is not intended to replace advice given to you by your health care provider. Make sure you discuss any questions you have with your health care provider. Document Revised: 05/13/2017 Document Reviewed: 04/01/2016 Elsevier Patient Education  2020 [employer] DASH Eating Plan DASH stands for "Dietary Approaches to Stop Hypertension." The DASH eating plan is a healthy eating plan that has been shown to reduce high blood pressure (hypertension). It may also reduce your risk for type 2 diabetes, heart disease, and stroke. The DASH eating plan may also help with weight loss. What are tips for following this plan?  General guidelines  Avoid eating more than 2,300 mg (milligrams) of salt (sodium) a day. If you have hypertension, you may need to reduce your sodium intake to 1,500 mg a day.  Limit alcohol intake to no more than 1 drink a day for nonpregnant women and 2 drinks a day for men. One drink equals 12 oz of beer, 5 oz of wine, or 1 oz of hard liquor.  Work with your health care provider to maintain a healthy body weight or to lose weight. Ask what an ideal weight is for you.  Get at least 30 minutes of exercise that causes your heart to beat faster (aerobic exercise) most days of the week. Activities may include walking, swimming, or  biking.  Work with your health care provider or diet and nutrition specialist (dietitian) to adjust your eating plan to your individual calorie needs. Reading food labels   Check food labels for the amount of sodium per serving. Choose foods with less than 5 percent of the Daily Value of sodium. Generally, foods with less than 300 mg of sodium per serving fit into this eating plan.  To find whole grains, look for the word "whole" as the first word in the ingredient list. Shopping  Buy products labeled as "low-sodium" or "no salt added."  Buy fresh foods. Avoid canned foods and premade or frozen meals. Cooking  Avoid adding salt when cooking. Use salt-free seasonings or herbs instead of table salt or sea salt. Check with your health care provider or pharmacist before using salt substitutes.  Do not   fry foods. Cook foods using healthy methods such as baking, boiling, grilling, and broiling instead.  Cook with heart-healthy oils, such as olive, canola, soybean, or sunflower oil. Meal planning  Eat a balanced diet that includes: ? 5 or more servings of fruits and vegetables each day. At each meal, try to fill half of your plate with fruits and vegetables. ? Up to 6-8 servings of whole grains each day. ? Less than 6 oz of lean meat, poultry, or fish each day. A 3-oz serving of meat is about the same size as a deck of cards. One egg equals 1 oz. ? 2 servings of low-fat dairy each day. ? A serving of nuts, seeds, or beans 5 times each week. ? Heart-healthy fats. Healthy fats called Omega-3 fatty acids are found in foods such as flaxseeds and coldwater fish, like sardines, salmon, and mackerel.  Limit how much you eat of the following: ? Canned or prepackaged foods. ? Food that is high in trans fat, such as fried foods. ? Food that is high in saturated fat, such as fatty meat. ? Sweets, desserts, sugary drinks, and other foods with added sugar. ? Full-fat dairy products.  Do not salt  foods before eating.  Try to eat at least 2 vegetarian meals each week.  Eat more home-cooked food and less restaurant, buffet, and fast food.  When eating at a restaurant, ask that your food be prepared with less salt or no salt, if possible. What foods are recommended? The items listed may not be a complete list. Talk with your dietitian about what dietary choices are best for you. Grains Whole-grain or whole-wheat bread. Whole-grain or whole-wheat pasta. Brown rice. Oatmeal. Quinoa. Bulgur. Whole-grain and low-sodium cereals. Pita bread. Low-fat, low-sodium crackers. Whole-wheat flour tortillas. Vegetables Fresh or frozen vegetables (raw, steamed, roasted, or grilled). Low-sodium or reduced-sodium tomato and vegetable juice. Low-sodium or reduced-sodium tomato sauce and tomato paste. Low-sodium or reduced-sodium canned vegetables. Fruits All fresh, dried, or frozen fruit. Canned fruit in natural juice (without added sugar). Meat and other protein foods Skinless chicken or turkey. Ground chicken or turkey. Pork with fat trimmed off. Fish and seafood. Egg whites. Dried beans, peas, or lentils. Unsalted nuts, nut butters, and seeds. Unsalted canned beans. Lean cuts of beef with fat trimmed off. Low-sodium, lean deli meat. Dairy Low-fat (1%) or fat-free (skim) milk. Fat-free, low-fat, or reduced-fat cheeses. Nonfat, low-sodium ricotta or cottage cheese. Low-fat or nonfat yogurt. Low-fat, low-sodium cheese. Fats and oils Soft margarine without trans fats. Vegetable oil. Low-fat, reduced-fat, or light mayonnaise and salad dressings (reduced-sodium). Canola, safflower, olive, soybean, and sunflower oils. Avocado. Seasoning and other foods Herbs. Spices. Seasoning mixes without salt. Unsalted popcorn and pretzels. Fat-free sweets. What foods are not recommended? The items listed may not be a complete list. Talk with your dietitian about what dietary choices are best for you. Grains Baked goods  made with fat, such as croissants, muffins, or some breads. Dry pasta or rice meal packs. Vegetables Creamed or fried vegetables. Vegetables in a cheese sauce. Regular canned vegetables (not low-sodium or reduced-sodium). Regular canned tomato sauce and paste (not low-sodium or reduced-sodium). Regular tomato and vegetable juice (not low-sodium or reduced-sodium). Pickles. Olives. Fruits Canned fruit in a light or heavy syrup. Fried fruit. Fruit in cream or butter sauce. Meat and other protein foods Fatty cuts of meat. Ribs. Fried meat. Bacon. Sausage. Bologna and other processed lunch meats. Salami. Fatback. Hotdogs. Bratwurst. Salted nuts and seeds. Canned beans with added salt.   Canned or smoked fish. Whole eggs or egg yolks. Chicken or turkey with skin. Dairy Whole or 2% milk, cream, and half-and-half. Whole or full-fat cream cheese. Whole-fat or sweetened yogurt. Full-fat cheese. Nondairy creamers. Whipped toppings. Processed cheese and cheese spreads. Fats and oils Butter. Stick margarine. Lard. Shortening. Ghee. Bacon fat. Tropical oils, such as coconut, palm kernel, or palm oil. Seasoning and other foods Salted popcorn and pretzels. Onion salt, garlic salt, seasoned salt, table salt, and sea salt. Worcestershire sauce. Tartar sauce. Barbecue sauce. Teriyaki sauce. Soy sauce, including reduced-sodium. Steak sauce. Canned and packaged gravies. Fish sauce. Oyster sauce. Cocktail sauce. Horseradish that you find on the shelf. Ketchup. Mustard. Meat flavorings and tenderizers. Bouillon cubes. Hot sauce and Tabasco sauce. Premade or packaged marinades. Premade or packaged taco seasonings. Relishes. Regular salad dressings. Where to find more information:  National Heart, Lung, and Blood Institute: www.nhlbi.nih.gov  American Heart Association: www.heart.org Summary  The DASH eating plan is a healthy eating plan that has been shown to reduce high blood pressure (hypertension). It may also reduce  your risk for type 2 diabetes, heart disease, and stroke.  With the DASH eating plan, you should limit salt (sodium) intake to 2,300 mg a day. If you have hypertension, you may need to reduce your sodium intake to 1,500 mg a day.  When on the DASH eating plan, aim to eat more fresh fruits and vegetables, whole grains, lean proteins, low-fat dairy, and heart-healthy fats.  Work with your health care provider or diet and nutrition specialist (dietitian) to adjust your eating plan to your individual calorie needs. This information is not intended to replace advice given to you by your health care provider. Make sure you discuss any questions you have with your health care provider. Document Revised: 10/01/2017 Document Reviewed: 10/12/2016 Elsevier Patient Education  2020 Elsevier Inc.  

## 2020-04-11 ENCOUNTER — Telehealth: Payer: Self-pay | Admitting: Gerontology

## 2020-04-11 NOTE — Telephone Encounter (Signed)
I was able to speak w/ pt and get him scheduled

## 2020-05-02 ENCOUNTER — Ambulatory Visit: Payer: Self-pay | Admitting: Gerontology

## 2020-05-09 ENCOUNTER — Other Ambulatory Visit: Payer: Self-pay | Admitting: Gerontology

## 2020-05-09 DIAGNOSIS — E1140 Type 2 diabetes mellitus with diabetic neuropathy, unspecified: Secondary | ICD-10-CM

## 2020-07-08 ENCOUNTER — Other Ambulatory Visit: Payer: Self-pay | Admitting: Gerontology

## 2020-07-08 DIAGNOSIS — E785 Hyperlipidemia, unspecified: Secondary | ICD-10-CM

## 2020-08-19 ENCOUNTER — Encounter: Payer: Self-pay | Admitting: Emergency Medicine

## 2020-08-19 ENCOUNTER — Other Ambulatory Visit: Payer: Self-pay

## 2020-08-19 DIAGNOSIS — Z5321 Procedure and treatment not carried out due to patient leaving prior to being seen by health care provider: Secondary | ICD-10-CM | POA: Insufficient documentation

## 2020-08-19 DIAGNOSIS — R109 Unspecified abdominal pain: Secondary | ICD-10-CM | POA: Insufficient documentation

## 2020-08-19 LAB — COMPREHENSIVE METABOLIC PANEL
ALT: 35 U/L (ref 0–44)
AST: 18 U/L (ref 15–41)
Albumin: 4.2 g/dL (ref 3.5–5.0)
Alkaline Phosphatase: 75 U/L (ref 38–126)
Anion gap: 14 (ref 5–15)
BUN: 15 mg/dL (ref 6–20)
CO2: 26 mmol/L (ref 22–32)
Calcium: 9.4 mg/dL (ref 8.9–10.3)
Chloride: 98 mmol/L (ref 98–111)
Creatinine, Ser: 1.54 mg/dL — ABNORMAL HIGH (ref 0.61–1.24)
GFR, Estimated: 56 mL/min — ABNORMAL LOW (ref >60)
Glucose, Bld: 119 mg/dL — ABNORMAL HIGH (ref 70–99)
Potassium: 4.4 mmol/L (ref 3.5–5.1)
Sodium: 138 mmol/L (ref 135–145)
Total Bilirubin: 1.2 mg/dL (ref 0.3–1.2)
Total Protein: 8.8 g/dL — ABNORMAL HIGH (ref 6.5–8.1)

## 2020-08-19 LAB — URINALYSIS, COMPLETE (UACMP) WITH MICROSCOPIC
Bacteria, UA: NONE SEEN
Bilirubin Urine: NEGATIVE
Glucose, UA: NEGATIVE mg/dL
Ketones, ur: NEGATIVE mg/dL
Leukocytes,Ua: NEGATIVE
Nitrite: NEGATIVE
Protein, ur: 30 mg/dL — AB
Specific Gravity, Urine: 1.025 (ref 1.005–1.030)
pH: 5 (ref 5.0–8.0)

## 2020-08-19 LAB — CBC
HCT: 52.2 % — ABNORMAL HIGH (ref 39.0–52.0)
Hemoglobin: 16.8 g/dL (ref 13.0–17.0)
MCH: 28.6 pg (ref 26.0–34.0)
MCHC: 32.2 g/dL (ref 30.0–36.0)
MCV: 88.9 fL (ref 80.0–100.0)
Platelets: 228 10*3/uL (ref 150–400)
RBC: 5.87 MIL/uL — ABNORMAL HIGH (ref 4.22–5.81)
RDW: 13.2 % (ref 11.5–15.5)
WBC: 12.8 10*3/uL — ABNORMAL HIGH (ref 4.0–10.5)
nRBC: 0 % (ref 0.0–0.2)

## 2020-08-19 LAB — LIPASE, BLOOD: Lipase: 23 U/L (ref 11–51)

## 2020-08-19 NOTE — ED Triage Notes (Signed)
C/O abdominal pain.  AAOx3.  Skin warm and dry. NAD

## 2020-08-20 ENCOUNTER — Encounter: Payer: Self-pay | Admitting: *Deleted

## 2020-08-20 ENCOUNTER — Emergency Department
Admission: EM | Admit: 2020-08-20 | Discharge: 2020-08-20 | Disposition: A | Discharge status: Home / Self Care | Payer: Self-pay | Attending: Emergency Medicine | Admitting: Emergency Medicine

## 2020-08-20 ENCOUNTER — Inpatient Hospital Stay
Admission: EM | Admit: 2020-08-20 | Discharge: 2020-08-22 | DRG: 392 | Disposition: A | Discharge status: Home / Self Care | Payer: Self-pay | Attending: Internal Medicine | Admitting: Internal Medicine

## 2020-08-20 ENCOUNTER — Emergency Department: Payer: Self-pay

## 2020-08-20 DIAGNOSIS — K5720 Diverticulitis of large intestine with perforation and abscess without bleeding: Principal | ICD-10-CM | POA: Diagnosis present

## 2020-08-20 DIAGNOSIS — E785 Hyperlipidemia, unspecified: Secondary | ICD-10-CM | POA: Diagnosis present

## 2020-08-20 DIAGNOSIS — Z9049 Acquired absence of other specified parts of digestive tract: Secondary | ICD-10-CM

## 2020-08-20 DIAGNOSIS — I10 Essential (primary) hypertension: Secondary | ICD-10-CM | POA: Diagnosis present

## 2020-08-20 DIAGNOSIS — N179 Acute kidney failure, unspecified: Secondary | ICD-10-CM | POA: Diagnosis present

## 2020-08-20 DIAGNOSIS — E119 Type 2 diabetes mellitus without complications: Secondary | ICD-10-CM

## 2020-08-20 DIAGNOSIS — Z8249 Family history of ischemic heart disease and other diseases of the circulatory system: Secondary | ICD-10-CM

## 2020-08-20 DIAGNOSIS — Z79899 Other long term (current) drug therapy: Secondary | ICD-10-CM

## 2020-08-20 DIAGNOSIS — K219 Gastro-esophageal reflux disease without esophagitis: Secondary | ICD-10-CM | POA: Diagnosis present

## 2020-08-20 DIAGNOSIS — E1140 Type 2 diabetes mellitus with diabetic neuropathy, unspecified: Secondary | ICD-10-CM | POA: Diagnosis present

## 2020-08-20 DIAGNOSIS — Z800 Family history of malignant neoplasm of digestive organs: Secondary | ICD-10-CM

## 2020-08-20 DIAGNOSIS — Z85038 Personal history of other malignant neoplasm of large intestine: Secondary | ICD-10-CM

## 2020-08-20 DIAGNOSIS — K5792 Diverticulitis of intestine, part unspecified, without perforation or abscess without bleeding: Secondary | ICD-10-CM

## 2020-08-20 DIAGNOSIS — K4090 Unilateral inguinal hernia, without obstruction or gangrene, not specified as recurrent: Secondary | ICD-10-CM | POA: Diagnosis present

## 2020-08-20 DIAGNOSIS — Z20822 Contact with and (suspected) exposure to covid-19: Secondary | ICD-10-CM | POA: Diagnosis present

## 2020-08-20 DIAGNOSIS — Z7984 Long term (current) use of oral hypoglycemic drugs: Secondary | ICD-10-CM

## 2020-08-20 DIAGNOSIS — Z803 Family history of malignant neoplasm of breast: Secondary | ICD-10-CM

## 2020-08-20 DIAGNOSIS — K5780 Diverticulitis of intestine, part unspecified, with perforation and abscess without bleeding: Secondary | ICD-10-CM

## 2020-08-20 LAB — CBC
HCT: 45.1 % (ref 39.0–52.0)
Hemoglobin: 14.5 g/dL (ref 13.0–17.0)
MCH: 28.5 pg (ref 26.0–34.0)
MCHC: 32.2 g/dL (ref 30.0–36.0)
MCV: 88.6 fL (ref 80.0–100.0)
Platelets: 172 10*3/uL (ref 150–400)
RBC: 5.09 MIL/uL (ref 4.22–5.81)
RDW: 13.2 % (ref 11.5–15.5)
WBC: 12.3 10*3/uL — ABNORMAL HIGH (ref 4.0–10.5)
nRBC: 0 % (ref 0.0–0.2)

## 2020-08-20 LAB — COMPREHENSIVE METABOLIC PANEL
ALT: 22 U/L (ref 0–44)
AST: 16 U/L (ref 15–41)
Albumin: 3.3 g/dL — ABNORMAL LOW (ref 3.5–5.0)
Alkaline Phosphatase: 63 U/L (ref 38–126)
Anion gap: 11 (ref 5–15)
BUN: 13 mg/dL (ref 6–20)
CO2: 24 mmol/L (ref 22–32)
Calcium: 8.5 mg/dL — ABNORMAL LOW (ref 8.9–10.3)
Chloride: 97 mmol/L — ABNORMAL LOW (ref 98–111)
Creatinine, Ser: 1.35 mg/dL — ABNORMAL HIGH (ref 0.61–1.24)
GFR, Estimated: >60 mL/min (ref >60)
Glucose, Bld: 114 mg/dL — ABNORMAL HIGH (ref 70–99)
Potassium: 4.1 mmol/L (ref 3.5–5.1)
Sodium: 132 mmol/L — ABNORMAL LOW (ref 135–145)
Total Bilirubin: 1.1 mg/dL (ref 0.3–1.2)
Total Protein: 6.8 g/dL (ref 6.5–8.1)

## 2020-08-20 LAB — GLUCOSE, CAPILLARY
Glucose-Capillary: 89 mg/dL (ref 70–99)
Glucose-Capillary: 100 mg/dL — ABNORMAL HIGH (ref 70–99)

## 2020-08-20 LAB — RESPIRATORY PANEL BY RT PCR (FLU A&B, COVID)
Influenza A by PCR: NEGATIVE
Influenza B by PCR: NEGATIVE
SARS Coronavirus 2 by RT PCR: NEGATIVE

## 2020-08-20 MED ORDER — ENOXAPARIN SODIUM 40 MG/0.4ML ~~LOC~~ SOLN: 40.0000 mg | SUBCUTANEOUS | Status: DC

## 2020-08-20 MED ORDER — HYDROMORPHONE HCL 1 MG/ML IJ SOLN
1.0000 mg | Freq: Once | INTRAMUSCULAR | Status: AC
  Administered 2020-08-20: 1 mg via INTRAVENOUS
  Filled 2020-08-20: qty 1

## 2020-08-20 MED ORDER — ENOXAPARIN SODIUM 60 MG/0.6ML ~~LOC~~ SOLN
0.5000 mg/kg | SUBCUTANEOUS | Status: DC
  Administered 2020-08-20 – 2020-08-21 (×2): 45 mg via SUBCUTANEOUS
  Filled 2020-08-20 (×3): qty 0.6

## 2020-08-20 MED ORDER — ONDANSETRON HCL 4 MG PO TABS: 4.0000 mg | ORAL_TABLET | Freq: Four times a day (QID) | ORAL | Status: DC | PRN

## 2020-08-20 MED ORDER — GABAPENTIN 250 MG/5ML PO SOLN
100.0000 mg | Freq: Every day | ORAL | Status: AC
  Administered 2020-08-21: 100 mg via ORAL
  Filled 2020-08-20 (×2): qty 2

## 2020-08-20 MED ORDER — ONDANSETRON HCL 4 MG/2ML IJ SOLN: 4.0000 mg | Freq: Four times a day (QID) | INTRAMUSCULAR | Status: DC | PRN

## 2020-08-20 MED ORDER — ONDANSETRON HCL 4 MG/2ML IJ SOLN
4.0000 mg | Freq: Once | INTRAMUSCULAR | Status: AC
  Administered 2020-08-20: 4 mg via INTRAVENOUS
  Filled 2020-08-20: qty 2

## 2020-08-20 MED ORDER — SODIUM CHLORIDE 0.9 % IV BOLUS
1000.0000 mL | Freq: Once | INTRAVENOUS | Status: AC
  Administered 2020-08-20: 1000 mL via INTRAVENOUS

## 2020-08-20 MED ORDER — HYDROMORPHONE HCL 1 MG/ML IJ SOLN: 1.0000 mg | INTRAMUSCULAR | Status: DC | PRN

## 2020-08-20 MED ORDER — IOHEXOL 300 MG/ML  SOLN
100.0000 mL | Freq: Once | INTRAMUSCULAR | Status: AC | PRN
  Administered 2020-08-20: 100 mL via INTRAVENOUS

## 2020-08-20 MED ORDER — PIPERACILLIN-TAZOBACTAM 3.375 G IVPB
3.3750 g | Freq: Three times a day (TID) | INTRAVENOUS | Status: DC
  Administered 2020-08-20 – 2020-08-22 (×6): 3.375 g via INTRAVENOUS
  Filled 2020-08-20 (×7): qty 50

## 2020-08-20 MED ORDER — DEXTROSE-NACL 5-0.9 % IV SOLN: INTRAVENOUS | Status: DC

## 2020-08-20 MED ORDER — INSULIN ASPART 100 UNIT/ML ~~LOC~~ SOLN: 0.0000 [IU] | SUBCUTANEOUS | Status: DC

## 2020-08-20 NOTE — ED Triage Notes (Signed)
Pt returned to ED reporting worsening abd pain and left sided flank pain with blood note din stool since this weekend. Pt is able to urinate. Pt was seen in ED yesterday with blood and urine resulted.

## 2020-08-20 NOTE — ED Provider Notes (Signed)
[facility] Emergency Department Provider Note  ____________________________________________   First MD Initiated Contact with Patient 08/20/20 [id number]     (approximate)  I have reviewed the triage vital signs and the nursing notes.   HISTORY  Chief Complaint Abdominal Pain and Flank Pain    HPI Luke [name] is a 39 y.o. male  With h/o DM, colon CA s/p resection, here with abdominal pain, nausea, diarrhea. Pt has remote h/o colon CA with partial colectomy in 2019, has been scheduled for inguinal hernia surgery since 11/2019 with Dr. Dahlia Byes. He reports that he went to a cookout on Saturday. The following morning, he felt some diffuse, but primarily left sided sharp, stabbing abd cramping along with nausea. He has since had nausea, NBNB emesis and constipation. He's been trying to have a BM but feels like he cannot, and has noticed a small amount of dark red blood in the small amount of stool he is able to produce over the last day. No fever, chills. No urinary sx. No recent med changes. No known sick contacts. Pain is intermittent, cramping. No alleviating factors.       Past Medical History:  Diagnosis Date  . Anemia   . Cancer ([place])    colon ca  . Cecum mass   . Colonic neoplasm   . Diverticulosis of large intestine without diverticulitis   . GERD (gastroesophageal reflux disease)   . Hypertension   . Pneumonia 09/07/2016  . Stomach irritation     Patient Active Problem List   Diagnosis Date Noted  . Type 2 diabetes mellitus with diabetic neuropathy, unspecified ([place]) 04/04/2020  . Type 2 diabetes mellitus without complications ([place]) 37/48/2707  . Snores 03/12/2020  . Prediabetes 02/01/2020  . Dyslipidemia (high LDL; low HDL) 02/01/2020  . History of colonic polyps   . Polyp of colon   . Constipation 08/15/2019  . History of inguinal hernia 08/15/2019  . Acid reflux 08/15/2019  . Unilateral inguinal hernia, without obstruction or gangrene, not  specified as recurrent 05/01/2019  . Hypertension 08/17/2018  . Columnar-lined esophagus   . Gastric polyp   . Iron deficiency anemia 07/23/2018  . B12 deficiency 07/23/2018    Past Surgical History:  Procedure Laterality Date  . COLON SURGERY    . COLONOSCOPY WITH PROPOFOL N/A 07/28/2018   Procedure: COLONOSCOPY WITH PROPOFOL;  Surgeon: Virgel Manifold, MD;  Location: [facility] ENDOSCOPY;  Service: Endoscopy;  Laterality: N/A;  . COLONOSCOPY WITH PROPOFOL N/A 10/18/2019   Procedure: COLONOSCOPY WITH PROPOFOL;  Surgeon: Virgel Manifold, MD;  Location: [facility] ENDOSCOPY;  Service: Endoscopy;  Laterality: N/A;  . ESOPHAGOGASTRODUODENOSCOPY (EGD) WITH PROPOFOL N/A 07/28/2018   Procedure: ESOPHAGOGASTRODUODENOSCOPY (EGD) WITH PROPOFOL;  Surgeon: Virgel Manifold, MD;  Location: [facility] ENDOSCOPY;  Service: Endoscopy;  Laterality: N/A;  . none    . PARTIAL COLECTOMY Right 08/15/2018   Procedure: RIGHT  COLECTOMY;  Surgeon: Vickie Epley, MD;  Location: [facility] ORS;  Service: General;  Laterality: Right;    Prior to Admission medications   Medication Sig Start Date End Date Taking? Authorizing Provider  amLODipine (NORVASC) 5 MG tablet Take 1 tablet (5 mg total) by mouth daily. 04/04/20   Iloabachie, Chioma E, NP  blood glucose meter kit and supplies KIT Dispense based on patient and insurance preference. Use up to four times daily as directed. (FOR ICD-9 250.00, 250.01). 04/04/20   Iloabachie, Chioma E, NP  Cyanocobalamin (VITAMIN B-12 PO) Take 1 tablet by mouth daily.  [provider]  gabapentin (NEURONTIN) 100 MG capsule Take 1 capsule by mouth at bedtime 05/14/20   Iloabachie, Chioma E, NP  IRON PO Take 1 tablet by mouth daily.    [provider]  metFORMIN (GLUCOPHAGE) 850 MG tablet Take 1 tablet (850 mg total) by mouth 2 (two) times daily with a meal. 03/12/20   Iloabachie, Chioma E, NP  polyethylene glycol (MIRALAX) 17 g packet Take 17 g by mouth daily. 08/15/19    Iloabachie, Chioma E, NP  pravastatin (PRAVACHOL) 10 MG tablet Take 1 tablet (10 mg total) by mouth daily. 04/04/20   Iloabachie, Chioma E, NP    Allergies Patient has no known allergies.  Family History  Problem Relation Age of Onset  . CAD Maternal Grandmother   . Breast cancer Maternal Grandmother   . Breast cancer Mother   . Hypertension Mother   . Hypertension Father   . Colon cancer Paternal Uncle     Social History Social History   Tobacco Use  . Smoking status: Never Smoker  . Smokeless tobacco: Never Used  . Tobacco comment: patient is social smoker/ smokes when he drinks   Vaping Use  . Vaping Use: Never used  Substance Use Topics  . Alcohol use: Yes    Alcohol/week: 1.0 standard drink    Types: 1 Cans of beer per week    Comment: patient reports he consumes ~2 beers about once per month  . Drug use: Not Currently    Types: Marijuana, Cocaine    Review of Systems  Review of Systems  Constitutional: Positive for fatigue. Negative for chills and fever.  HENT: Negative for sore throat.   Respiratory: Negative for shortness of breath.   Cardiovascular: Negative for chest pain.  Gastrointestinal: Positive for abdominal pain, constipation, nausea and vomiting.  Genitourinary: Negative for flank pain.  Musculoskeletal: Negative for neck pain.  Skin: Negative for rash and wound.  Allergic/Immunologic: Negative for immunocompromised state.  Neurological: Positive for weakness. Negative for numbness.  Hematological: Does not bruise/bleed easily.  All other systems reviewed and are negative.    ____________________________________________  PHYSICAL EXAM:      VITAL SIGNS: ED Triage Vitals  Enc Vitals Group     BP 08/20/20 0657 124/81     Pulse Rate 08/20/20 0657 83     Resp 08/20/20 0657 16     Temp 08/20/20 0659 98.2 F (36.8 C)     Temp Source 08/20/20 0659 Oral     SpO2 08/20/20 0657 97 %     Weight 08/20/20 0657 201 lb 11.5 oz (91.5 kg)     Height  08/20/20 0657 '5\' 8"'  (1.727 m)     Head Circumference --      Peak Flow --      Pain Score 08/20/20 0657 8     Pain Loc --      Pain Edu? --      Excl. in [place]? --      Physical Exam Vitals and nursing note reviewed.  Constitutional:      General: He is not in acute distress.    Appearance: He is well-developed.  HENT:     Head: Normocephalic and atraumatic.  Eyes:     Conjunctiva/sclera: Conjunctivae normal.  Cardiovascular:     Rate and Rhythm: Normal rate and regular rhythm.     Heart sounds: Normal heart sounds. No murmur heard.  No friction rub.  Pulmonary:     Effort: Pulmonary effort is normal.  No respiratory distress.     Breath sounds: Normal breath sounds. No wheezing or rales.  Abdominal:     General: There is no distension.     Palpations: Abdomen is soft.     Tenderness: There is abdominal tenderness in the periumbilical area, left upper quadrant and left lower quadrant. There is no guarding or rebound.  Musculoskeletal:     Cervical back: Neck supple.  Skin:    General: Skin is warm.     Capillary Refill: Capillary refill takes less than 2 seconds.  Neurological:     Mental Status: He is alert and oriented to person, place, and time.     Motor: No abnormal muscle tone.       ____________________________________________   LABS (all labs ordered are listed, but only abnormal results are displayed)  Labs Reviewed - No data to display  ____________________________________________  EKG:  ________________________________________  RADIOLOGY All imaging, including plain films, CT scans, and ultrasounds, independently reviewed by me, and interpretations confirmed via formal radiology reads.  ED MD interpretation:   CT A/P: Moderate perforated descending colonic diverticulitis with minimal extraluminal gas, no obstructions, no abscesses  Official radiology report(s): No results  found.  ____________________________________________  PROCEDURES   Procedure(s) performed (including Critical Care):  .Critical Care Performed by: Duffy Bruce, MD Authorized by: Duffy Bruce, MD   Critical care provider statement:    Critical care time (minutes):  35   Critical care time was exclusive of:  Separately billable procedures and treating other patients and teaching time   Critical care was necessary to treat or prevent imminent or life-threatening deterioration of the following conditions:  Cardiac failure, circulatory failure and respiratory failure   Critical care was time spent personally by me on the following activities:  Development of treatment plan with patient or surrogate, discussions with consultants, evaluation of patient's response to treatment, examination of patient, obtaining history from patient or surrogate, ordering and performing treatments and interventions, ordering and review of laboratory studies, ordering and review of radiographic studies, pulse oximetry, re-evaluation of patient's condition and review of old charts   I assumed direction of critical care for this patient from another provider in my specialty: no      ____________________________________________  INITIAL IMPRESSION / MDM / [place] / ED COURSE  As part of my medical decision making, I reviewed the following data within the [place] notes reviewed and incorporated, Old chart reviewed, Notes from prior ED visits, and [place] Controlled Substance Database       *Luke [name] was evaluated in Emergency Department on 08/20/2020 for the symptoms described in the history of present illness. He was evaluated in the context of the global COVID-19 pandemic, which necessitated consideration that the patient might be at risk for infection with the SARS-CoV-2 virus that causes COVID-19. Institutional protocols and algorithms that pertain to the  evaluation of patients at risk for COVID-19 are in a state of rapid change based on information released by regulatory bodies including the CDC and federal and state organizations. These policies and algorithms were followed during the patient's care in the ED.  Some ED evaluations and interventions may be delayed as a result of limited staffing during the pandemic.*     Medical Decision Making:  39 yo M here with abd pain, nausea, constipation and bloody stools. H/o colon CA. On arrival, pt afebirle, HDS. Reviewed labs from earlier - moderate leukocytosis noted, no signs of AKI, LFTs wnl.  IVF, analgesia given and CT Obtained. CT reviewed by me, concerning for perforated diverticulitis. WIll start Zosyn, admit. Surgery paged.   Discussed with Dr. Dahlia Byes. Will admit to medicine for IV ABX, fluids. Pt updated and in agreement.  ____________________________________________  FINAL CLINICAL IMPRESSION(S) / ED DIAGNOSES  Final diagnoses:  None     MEDICATIONS GIVEN DURING THIS VISIT:  Medications - No data to display   ED Discharge Orders    None       Note:  This document was prepared using Dragon voice recognition software and may include unintentional dictation errors.   Duffy Bruce, MD 08/20/20 [phone]

## 2020-08-20 NOTE — ED Notes (Signed)
No answer when called several times from lobby 

## 2020-08-20 NOTE — Consult Note (Signed)
Patient ID: Luke [name], male   DOB: June 21, 1981, 39 y.o.   MRN: [id number]  HPI Luke [name] is a 39 y.o. male seen in consultation at the request of Luke [name] from the emergency room ( d/w him in detail).  He comes in with a 3-day history of left lower quadrant abdominal pain.  The pain is intermittent, moderate intensity and colicky type, no specific alleviating or aggravating factors.  He did have associated nausea decreased appetite and diarrhea.  He denies any fevers any chills no melena or hematochezia.  He did have prior history of an open right colectomy 2009 that was uneventful this was for resectable polyp.  He also had prior history of right inguinal hernia but never showed to his operation.  T scan personally reviewed showing evidence of prior ileocolostomy with intact staple line.  There is also evidence of descending diverticulitis with microperforation.  There is also evidence of sigmoid diverticulosis.  There is a right inguinal hernia.  There is no overt free air.  Laboratory values include creatinine of 1.5.  He had does have chronic kidney insufficiency.  CBC shows white count of 12.8 and rest of the labs are normal. He is able to perform more than 4 METS of activity without any shortness of breath or chest pain.   HPI  Past Medical History:  Diagnosis Date  . Anemia   . Cancer ([place])    colon ca  . Cecum mass   . Colonic neoplasm   . Diverticulosis of large intestine without diverticulitis   . GERD (gastroesophageal reflux disease)   . Hypertension   . Pneumonia 09/07/2016  . Stomach irritation     Past Surgical History:  Procedure Laterality Date  . COLON SURGERY    . COLONOSCOPY WITH PROPOFOL N/A 07/28/2018   Procedure: COLONOSCOPY WITH PROPOFOL;  Surgeon: Luke Manifold, MD;  Location: [facility] ENDOSCOPY;  Service: Endoscopy;  Laterality: N/A;  . COLONOSCOPY WITH PROPOFOL N/A 10/18/2019   Procedure: COLONOSCOPY WITH PROPOFOL;  Surgeon: Luke Manifold,  MD;  Location: [facility] ENDOSCOPY;  Service: Endoscopy;  Laterality: N/A;  . ESOPHAGOGASTRODUODENOSCOPY (EGD) WITH PROPOFOL N/A 07/28/2018   Procedure: ESOPHAGOGASTRODUODENOSCOPY (EGD) WITH PROPOFOL;  Surgeon: Luke Manifold, MD;  Location: [facility] ENDOSCOPY;  Service: Endoscopy;  Laterality: N/A;  . none    . PARTIAL COLECTOMY Right 08/15/2018   Procedure: RIGHT  COLECTOMY;  Surgeon: Luke Epley, MD;  Location: [facility] ORS;  Service: General;  Laterality: Right;    Family History  Problem Relation Age of Onset  . CAD Maternal Grandmother   . Breast cancer Maternal Grandmother   . Breast cancer Mother   . Hypertension Mother   . Hypertension Father   . Colon cancer Paternal Uncle     Social History Social History   Tobacco Use  . Smoking status: Never Smoker  . Smokeless tobacco: Never Used  . Tobacco comment: patient is social smoker/ smokes when he drinks   Vaping Use  . Vaping Use: Never used  Substance Use Topics  . Alcohol use: Yes    Alcohol/week: 1.0 standard drink    Types: 1 Cans of beer per week    Comment: patient reports he consumes ~2 beers about once per month  . Drug use: Not Currently    Types: Marijuana, Cocaine    No Known Allergies  Current Facility-Administered Medications  Medication Dose Route Frequency Provider Last Rate Last Admin  . piperacillin-tazobactam (ZOSYN) IVPB 3.375 g  3.375 g  Intravenous Luke Dubs, MD   Stopped at 08/20/20 1100   Current Outpatient Medications  Medication Sig Dispense Refill  . amLODipine (NORVASC) 5 MG tablet Take 1 tablet (5 mg total) by mouth daily. 90 tablet 1  . gabapentin (NEURONTIN) 100 MG capsule Take 1 capsule by mouth at bedtime (Patient taking differently: Take 100 mg by mouth at bedtime. ) 30 capsule 0  . metFORMIN (GLUCOPHAGE) 850 MG tablet Take 1 tablet (850 mg total) by mouth 2 (two) times daily with a meal. 60 tablet 2  . pravastatin (PRAVACHOL) 10 MG tablet Take 1 tablet (10 mg total) by  mouth daily. 30 tablet 0  . blood glucose meter kit and supplies KIT Dispense based on patient and insurance preference. Use up to four times daily as directed. (FOR ICD-9 250.00, 250.01). 1 each 0  . Cyanocobalamin (VITAMIN B-12 PO) Take 1 tablet by mouth daily. (Patient not taking: Reported on 08/20/2020)    . IRON PO Take 1 tablet by mouth daily. (Patient not taking: Reported on 08/20/2020)    . polyethylene glycol (MIRALAX) 17 g packet Take 17 g by mouth daily. 14 each 0     Review of Systems Full ROS  was asked and was negative except for the information on the HPI  Physical Exam Blood pressure 121/80, pulse 63, temperature 98.2 F (36.8 C), temperature source Oral, resp. rate 16, height _0  (1.727 m), weight 91.5 kg, SpO2 92 %. CONSTITUTIONAL: NAD. EYES: Pupils are equal, round,  Sclera are non-icteric. EARS, NOSE, MOUTH AND THROAT: Wearing a maskHearing is intact to voice. LYMPH NODES:  Lymph nodes in the neck are normal. RESPIRATORY:  Lungs are clear. There is normal respiratory effort, with equal breath sounds bilaterally, and without pathologic use of accessory muscles. CARDIOVASCULAR: Heart is regular without murmurs, gallops, or rubs. GI: The abdomen is  soft, Tender to palpation to the left of the umbilicus, no peritonitis or rebound, There is no hepatosplenomegaly. There are normal bowel sounds in all quadrants.  Prior midline laparotomy scar.  Reducible right inguinal hernia GU: Rectal deferred.   MUSCULOSKELETAL: Normal muscle strength and tone. No cyanosis or edema.   SKIN: Turgor is good and there are no pathologic skin lesions or ulcers. NEUROLOGIC: Motor and sensation is grossly normal. Cranial nerves are grossly intact. PSYCH:  Oriented to person, place and time. Affect is normal.  Data Reviewed  I have personally reviewed the patient's imaging, laboratory findings and medical records.    Assessment/ Plan Luke [name] is a 39 year old male well-known to me with a  prior history of right colectomy for unresectable polyp.  Presents with complicated descending colon diverticulitis near the splenic flexure microperforation.  Currently patient not toxic not showing signs of peritonitis.  He Does not need emergent surgical intervention. I do think that given his age and evidence of perforation he will benefit from elective descending and sigmoid colectomy.  I Do think that he still has significant answers: To allow a tension-free anastomosis but definitely this will not be an easy surgery to perform.  Discussed with the patient in detail about his disease process.  For now recommend continuation of medical therapy.  We will keep n.p.o. for today antibiotics.  No need for emergent surgical intervention at this time. We will continue to follow him    Caroleen Hamman, MD FACS General Surgeon 08/20/2020, 1:22 PM

## 2020-08-20 NOTE — Progress Notes (Signed)
PHARMACY -  BRIEF ANTIBIOTIC NOTE   Pharmacy has received consult(s) for Zosyn from an ED provider.  The patient's profile has been reviewed for ht/wt/allergies/indication/available labs.    order(s) placed for Zosyn 3.375gm  Further antibiotics/pharmacy consults should be ordered by admitting physician if indicated.                       Thank you, Fatou Dunnigan A 08/20/2020  9:21 AM

## 2020-08-20 NOTE — H&P (Addendum)
History and Physical    Luke [name] LPF:[id number] DOB: December 17, 1980 DOA: 08/20/2020  PCP: Pcp, No   Patient coming from: Home  I have personally briefly reviewed patient's old medical records in [place]  Chief Complaint: Abdominal pain  HPI: Luke [name] is a 39 y.o. male with medical history significant for diabetes mellitus, hypertension status post partial colectomy for colon cancer who presents to the ER for evaluation of abdominal pain mostly in the left lower quadrant with radiation to the left flank and left upper quadrant associated with nausea.  Patient reports that he had gone to a cookout about 3 days prior to his admission and the next morning he developed a left-sided sharp, stabbing pain most in the lower quadrants associated with nausea and emesis which was nonbilious and nonbloody as well as constipation.  He rated his pain an 8 x 10 in intensity at its worst.  He has noticed a small amount of dark red blood in his stool over the last day.  Pain has been intermittent in nature with no relieving or aggravating factors. He denies having any fever, no chills, no cough, no shortness of breath, no chest pain, no dizziness or lightheadedness. Labs show sodium 132, potassium 4.1, chloride 97, bicarb 24, BUN 13, creatinine 1.35, calcium 8.5, alkaline phosphatase 63, albumin 3.3, AST 16, ALT 22, total protein 6.8, white count 12.3, hemoglobin 14.5, hematocrit 45.1, MCV 88, RDW 13.2, platelet count 172 Respiratory viral panel is negative CT scan of abdomen and pelvis shows moderate perforated descending colonic diverticulitis with minimal pericolonic extraluminal gas.  No gross free intraperitoneal gas, loculated intra-abdominal fluid collections or evidence of obstruction.    ED Course: Patient is a 39 year old African-American male with a history of diabetes mellitus, hypertension and partial colectomy for colon cancer who presents to the emergency room evaluation of a  3-day history of abdominal pain mostly left-sided involving the left lower quadrant and left flank associated with nausea ,vomiting and constipation.  CT scan of abdomen and pelvis shows moderate perforated descending colonic diverticulitis with minimal pericolonic extraluminal gas.  No gross free intraperitoneal air, loculated intra-abdominal fluid collections or evidence of obstruction.  Patient will be admitted to the hospital for evaluation.  Review of Systems: As per HPI otherwise 10 point review of systems negative.    Past Medical History:  Diagnosis Date  . Anemia   . Cancer ([place])    colon ca  . Cecum mass   . Colonic neoplasm   . Diverticulosis of large intestine without diverticulitis   . GERD (gastroesophageal reflux disease)   . Hypertension   . Pneumonia 09/07/2016  . Stomach irritation     Past Surgical History:  Procedure Laterality Date  . COLON SURGERY    . COLONOSCOPY WITH PROPOFOL N/A 07/28/2018   Procedure: COLONOSCOPY WITH PROPOFOL;  Surgeon: Virgel Manifold, MD;  Location: [facility] END[facility]COPY;  Service: Endoscopy;  Laterality: N/A;  . COLONOSCOPY WITH PROPOFOL N/A 10/18/2019   Procedure: COLONOSCOPY WITH PROPOFOL;  Surgeon: Virgel Manifold, MD;  Location: [facility] END[facility]COPY;  Service: Endoscopy;  Laterality: N/A;  . ESOPHAGOGASTRODUODENOSCOPY (EGD) WITH PROPOFOL N/A 07/28/2018   Procedure: ESOPHAGOGASTRODUODENOSCOPY (EGD) WITH PROPOFOL;  Surgeon: Virgel Manifold, MD;  Location: [facility] ENDOSCOPY;  Service: Endoscopy;  Laterality: N/A;  . none    . PARTIAL COLECTOMY Right 08/15/2018   Procedure: RIGHT  COLECTOMY;  Surgeon: Vickie Epley, MD;  Location: [facility] ORS;  Service: General;  Laterality: Right;  reports that he has never smoked. He has never used smokeless tobacco. He reports current alcohol use of about 1.0 standard drink of alcohol per week. He reports previous drug use. Drugs: Marijuana and Cocaine.  No Known Allergies  Family History   Problem Relation Age of Onset  . CAD Maternal Grandmother   . Breast cancer Maternal Grandmother   . Breast cancer Mother   . Hypertension Mother   . Hypertension Father   . Colon cancer Paternal Uncle      Prior to Admission medications   Medication Sig Start Date End Date Taking? Authorizing Provider  amLODipine (NORVASC) 5 MG tablet Take 1 tablet (5 mg total) by mouth daily. 04/04/20  Yes Iloabachie, Chioma E, NP  gabapentin (NEURONTIN) 100 MG capsule Take 1 capsule by mouth at bedtime Patient taking differently: Take 100 mg by mouth at bedtime.  05/14/20  Yes Iloabachie, Chioma E, NP  metFORMIN (GLUCOPHAGE) 850 MG tablet Take 1 tablet (850 mg total) by mouth 2 (two) times daily with a meal. 03/12/20  Yes Iloabachie, Chioma E, NP  pravastatin (PRAVACHOL) 10 MG tablet Take 1 tablet (10 mg total) by mouth daily. 04/04/20  Yes Iloabachie, Chioma E, NP  blood glucose meter kit and supplies KIT Dispense based on patient and insurance preference. Use up to four times daily as directed. (FOR ICD-9 250.00, 250.01). 04/04/20   Iloabachie, Chioma E, NP  Cyanocobalamin (VITAMIN B-12 PO) Take 1 tablet by mouth daily. Patient not taking: Reported on 08/20/2020    [provider]  IRON PO Take 1 tablet by mouth daily. Patient not taking: Reported on 08/20/2020    [provider]  polyethylene glycol (MIRALAX) 17 g packet Take 17 g by mouth daily. 08/15/19   Caryl Asp E, NP    Physical Exam: Vitals:   08/20/20 1000 08/20/20 1030 08/20/20 1100 08/20/20 1245  BP: 122/81 109/70 105/73 121/80  Pulse: 71 (!) 59 60 63  Resp:   15 16  Temp:      TempSrc:      SpO2: 97% 96% 97% 92%  Weight:      Height:         Vitals:   08/20/20 1000 08/20/20 1030 08/20/20 1100 08/20/20 1245  BP: 122/81 109/70 105/73 121/80  Pulse: 71 (!) 59 60 63  Resp:   15 16  Temp:      TempSrc:      SpO2: 97% 96% 97% 92%  Weight:      Height:        Constitutional: NAD, alert and oriented x  3 Eyes: PERRL, lids and conjunctivae normal ENMT: Mucous membranes are dry.  Neck: normal, supple, no masses, no thyromegaly Respiratory: clear to auscultation bilaterally, no wheezing, no crackles. Normal respiratory effort. No accessory muscle use.  Cardiovascular: Regular rate and rhythm, no murmurs / rubs / gallops. No extremity edema. 2+ pedal pulses. No carotid bruits.  Abdomen:  Tenderness in the left lower quadrant and left flank, no masses palpated. No hepatosplenomegaly. Bowel sounds positive.  Periumbilical surgical scar Musculoskeletal: no clubbing / cyanosis. No joint deformity upper and lower extremities.  Skin: no rashes, lesions, ulcers.  Neurologic: No gross focal neurologic deficit. Psychiatric: Normal mood and affect.   Labs on Admission: I have personally reviewed following labs and imaging studies  CBC: Recent Labs  Lab 08/19/20 1727 08/20/20 0820  WBC 12.8* 12.3*  HGB 16.8 14.5  HCT 52.2* 45.1  MCV 88.9 88.6  PLT 228 172  Basic Metabolic Panel: Recent Labs  Lab 08/19/20 1727 08/20/20 0820  NA 138 132*  K 4.4 4.1  CL 98 97*  CO2 26 24  GLUCOSE 119* 114*  BUN 15 13  CREATININE 1.54* 1.35*  CALCIUM 9.4 8.5*   GFR: Estimated Creatinine Clearance: 79.8 mL/min (A) (by C-G formula based on SCr of 1.35 mg/dL (H)). Liver Function Tests: Recent Labs  Lab 08/19/20 1727 08/20/20 0820  AST 18 16  ALT 35 22  ALKPHOS 75 63  BILITOT 1.2 1.1  PROT 8.8* 6.8  ALBUMIN 4.2 3.3*   Recent Labs  Lab 08/19/20 1727  LIPASE 23   No results for input(s): AMMONIA in the last 168 hours. Coagulation Profile: No results for input(s): INR, PROTIME in the last 168 hours. Cardiac Enzymes: No results for input(s): CKTOTAL, CKMB, CKMBINDEX, TROPONINI in the last 168 hours. BNP (last 3 results) No results for input(s): PROBNP in the last 8760 hours. HbA1C: No results for input(s): HGBA1C in the last 72 hours. CBG: No results for input(s): GLUCAP in the last 168  hours. Lipid Profile: No results for input(s): CHOL, HDL, LDLCALC, TRIG, CHOLHDL, LDLDIRECT in the last 72 hours. Thyroid Function Tests: No results for input(s): TSH, T4TOTAL, FREET4, T3FREE, THYROIDAB in the last 72 hours. Anemia Panel: No results for input(s): VITAMINB12, FOLATE, FERRITIN, TIBC, IRON, RETICCTPCT in the last 72 hours. Urine analysis:    Component Value Date/Time   COLORURINE AMBER (A) 08/19/2020 1842   APPEARANCEUR HAZY (A) 08/19/2020 1842   APPEARANCEUR Clear 04/23/2013 2126   LABSPEC 1.025 08/19/2020 1842   LABSPEC 1.023 04/23/2013 2126   PHURINE 5.0 08/19/2020 1842   GLUCOSEU NEGATIVE 08/19/2020 1842   GLUCOSEU Negative 04/23/2013 2126   HGBUR MODERATE (A) 08/19/2020 1842   BILIRUBINUR NEGATIVE 08/19/2020 1842   BILIRUBINUR Negative 04/23/2013 2126   [place] NEGATIVE 08/19/2020 1842   PROTEINUR 30 (A) 08/19/2020 1842   NITRITE NEGATIVE 08/19/2020 1842   LEUKOCYTESUR NEGATIVE 08/19/2020 1842   LEUKOCYTESUR Negative 04/23/2013 2126    Radiological Exams on Admission: CT ABDOMEN PELVIS W CONTRAST  Result Date: 08/20/2020 CLINICAL DATA:  Left abdominal pain, nausea, vomiting, diarrhea EXAM: CT ABDOMEN AND PELVIS WITH CONTRAST TECHNIQUE: Multidetector CT imaging of the abdomen and pelvis was performed using the standard protocol following bolus administration of intravenous contrast. CONTRAST:  171m OMNIPAQUE IOHEXOL 300 MG/ML  SOLN COMPARISON:  08/08/2018 FINDINGS: Lower chest: The visualized lung bases are clear bilaterally. The visualized heart and pericardium are unremarkable. Tiny hiatal hernia. Hepatobiliary: No focal liver abnormality is seen. No gallstones, gallbladder wall thickening, or biliary dilatation. Pancreas: Unremarkable Spleen: Unremarkable Adrenals/Urinary Tract: Adrenal glands are unremarkable. Kidneys are normal, without renal calculi, focal lesion, or hydronephrosis. Bladder is unremarkable. Stomach/Bowel: There is extensive pericolonic  inflammatory stranding, punctate foci of extraluminal gas, and circumferential focal mural thickening involving the mid descending colon in keeping with changes of a moderate, perforated, acute diverticulitis. There is background moderate descending and sigmoid colonic diverticulosis. There is no gross free intraperitoneal gas. No loculated intra-abdominal fluid collections or free intraperitoneal fluid. There is no evidence of obstruction. Surgical changes of ileocolectomy are identified. The large and small bowel are otherwise unremarkable. Stomach unremarkable. Vascular/Lymphatic: Retroaortic left renal vein. Mild atherosclerotic calcification noted within the abdominal aorta. No aortic aneurysm. The abdominal vasculature is otherwise unremarkable. No pathologic adenopathy within the abdomen. Reproductive: Prostate is unremarkable. Other: Small fat containing direct right inguinal hernia. Infraumbilical laparotomy scar noted. Diastasis of the rectus abdominus musculature in the region  of the umbilicus. Musculoskeletal: No lytic or blastic bone lesions are seen. IMPRESSION: Moderate perforated descending colonic diverticulitis with minimal pericolonic extraluminal gas. No gross free intraperitoneal gas, loculated intra-abdominal fluid collections, or evidence of obstruction. Aortic Atherosclerosis (ICD10-I70.0). Electronically Signed   By: Fidela Salisbury MD   On: 08/20/2020 08:32    EKG: Independently reviewed.   Assessment/Plan Principal Problem:   Diverticulitis of colon with perforation Active Problems:   Hypertension   Type 2 diabetes mellitus without complications ([place])     Diverticulitis of colon with perforation Patient presents for evaluation of abdominal pain mostly involving the left flank and left lower quadrant associated with nausea, vomiting and constipation Imaging shows perforated descending colonic diverticulitis with minimal pericolonic extraluminal gas with no free  intraperitoneal gas, loculated intra-abdominal fluid collection evidence of obstruction He does not appear toxic looking, he is not febrile but has mild leukocytosis We will keep patient n.p.o. Pain control, IV fluid hydration, antiemetics and IV antibiotic therapy with Zosyn adjusted to renal function We will consult surgery   Hypertension Blood pressure stable We will monitor closely   Diabetes mellitus Hold Metformin since patient is n.p.o. IV fluid hydration Accu-Cheks every 4 hours with sliding scale coverage    DVT prophylaxis: Lovenox Code Status: Full code Family Communication: Greater than 50% of time was spent discussing plan of care with patient at the bedside.  All questions and concerns have been addressed.  He verbalizes understanding and agrees with the plan of care Disposition Plan: Back to previous home environment Consults called: Surgery    Tochukwu Agbata MD [facility]     08/20/2020, 1:24 PM

## 2020-08-20 NOTE — Progress Notes (Signed)
Pharmacy Antibiotic Note  Luke [name] is a 39 y.o. male admitted on 08/20/2020 with intra-abdominal infection. PMH anemia, diverticulosis or large intestine without diverticulitis. GERD, HTN, DM. Patient also has h/o colon CA s/p resection (2019) who presented with abdominal pain, nausea, and diarrhea. WBC 12.3, afebrile, Scr 1.35. Pharmacy has been consulted for Zosyn dosing.   10/19 CT abdomen: Moderate perforated descending colonic diverticulitis with minimal pericolonic extraluminal gas. No gross free intraperitoneal gas, loculated intra-abdominal fluid collections, or evidence of obstruction.  Plan: Zosyn 3.375g IV q8h (4 hour infusion) Monitor renal function and adjust dose as clinically indicated  Height: 5\' 8"  (172.7 cm) Weight: 91.5 kg (201 lb 11.5 oz) IBW/kg (Calculated) : 68.4  Temp (24hrs), Avg:98.6 F (37 C), Min:98.2 F (36.8 C), Max:99 F (37.2 C)  Recent Labs  Lab 08/19/20 1727 08/20/20 0820  WBC 12.8* 12.3*  CREATININE 1.54* 1.35*    Estimated Creatinine Clearance: 79.8 mL/min (A) (by C-G formula based on SCr of 1.35 mg/dL (H)).    No Known Allergies  Antimicrobials this admission: 10/19 Zosyn >>   Microbiology results: 10/19 BCx: pending  Thank you for allowing pharmacy to be a part of this patient's care.  Sherilyn Banker, PharmD Pharmacy Resident  08/20/2020 9:59 AM

## 2020-08-20 NOTE — ED Notes (Signed)
Pt requested home meds to be given, this RN talked to floor coverage doctor, Dr. Sharlet Salina, who informed this RN of patient not receiving meds due to no PO intake. This RN educated patient on receiving insulin instead of metformin, having PRN blood pressure medications, and new order placed for Neurontin. Pt understands and has no further questions at this time.

## 2020-08-21 DIAGNOSIS — K5792 Diverticulitis of intestine, part unspecified, without perforation or abscess without bleeding: Secondary | ICD-10-CM

## 2020-08-21 LAB — CBC
HCT: 41.3 % (ref 39.0–52.0)
Hemoglobin: 13.4 g/dL (ref 13.0–17.0)
MCH: 29.2 pg (ref 26.0–34.0)
MCHC: 32.4 g/dL (ref 30.0–36.0)
MCV: 90 fL (ref 80.0–100.0)
Platelets: 176 10*3/uL (ref 150–400)
RBC: 4.59 MIL/uL (ref 4.22–5.81)
RDW: 13.2 % (ref 11.5–15.5)
WBC: 6.1 10*3/uL (ref 4.0–10.5)
nRBC: 0 % (ref 0.0–0.2)

## 2020-08-21 LAB — GLUCOSE, CAPILLARY
Glucose-Capillary: 104 mg/dL — ABNORMAL HIGH (ref 70–99)
Glucose-Capillary: 112 mg/dL — ABNORMAL HIGH (ref 70–99)
Glucose-Capillary: 120 mg/dL — ABNORMAL HIGH (ref 70–99)
Glucose-Capillary: 109 mg/dL — ABNORMAL HIGH (ref 70–99)
Glucose-Capillary: 78 mg/dL (ref 70–99)
Glucose-Capillary: 101 mg/dL — ABNORMAL HIGH (ref 70–99)
Glucose-Capillary: 118 mg/dL — ABNORMAL HIGH (ref 70–99)

## 2020-08-21 LAB — BASIC METABOLIC PANEL
Anion gap: 8 (ref 5–15)
BUN: 10 mg/dL (ref 6–20)
CO2: 26 mmol/L (ref 22–32)
Calcium: 8.4 mg/dL — ABNORMAL LOW (ref 8.9–10.3)
Chloride: 102 mmol/L (ref 98–111)
Creatinine, Ser: 1.43 mg/dL — ABNORMAL HIGH (ref 0.61–1.24)
GFR, Estimated: >60 mL/min (ref >60)
Glucose, Bld: 112 mg/dL — ABNORMAL HIGH (ref 70–99)
Potassium: 4.2 mmol/L (ref 3.5–5.1)
Sodium: 136 mmol/L (ref 135–145)

## 2020-08-21 LAB — HEMOGLOBIN A1C
Hgb A1c MFr Bld: 7.1 % — ABNORMAL HIGH (ref 4.8–5.6)
Mean Plasma Glucose: 157 mg/dL

## 2020-08-21 LAB — HIV ANTIBODY (ROUTINE TESTING W REFLEX): HIV Screen 4th Generation wRfx: NONREACTIVE

## 2020-08-21 NOTE — Progress Notes (Signed)
[facility] SURGICAL PROGRESS NOTE (cpt [phone])  Hospital Day(s): 1.   Interval History: Patient seen and examined, no acute events or new complaints overnight. Patient reports he is still having abdominal pain. At the worst, this is a 6/10 in his LLQ radiating to his left flank. He denies fever, chills, nausea, emesis. Leukocytosis resolved this morning, down to 3.1K. Still with persistent AKI, sCr - 1.43. No significant electrolyte derangements appreciable. He remained NPO overnight. No further issues at this time.   Review of Systems:  Constitutional: denies fever, chills  HEENT: denies cough or congestion  Respiratory: denies any shortness of breath  Cardiovascular: denies chest pain or palpitations  Gastrointestinal: + abdominal pain, denied N/V, or diarrhea/and bowel function as per interval history Genitourinary: denies burning with urination or urinary frequency  Vital signs in last 24 hours: [min-max] current  Temp:  [97.5 F (36.4 C)-99.1 F (37.3 C)] 98.4 F (36.9 C) (10/20 0512) Pulse Rate:  [57-94] 61 (10/20 0512) Resp:  [15-26] 18 (10/20 0512) BP: (103-132)/(59-102) 118/86 (10/20 0512) SpO2:  [90 %-100 %] 99 % (10/20 0512)     Height: 5\' 10"  (177.8 cm) Weight: 91.5 kg BMI (Calculated): 30.68   Intake/Output last 2 shifts:  No intake/output data recorded.   Physical Exam:  Constitutional: alert, cooperative and no distress  HENT: normocephalic without obvious abnormality  Eyes: PERRL, EOM's grossly intact and symmetric  Respiratory: breathing non-labored at rest  Cardiovascular: regular rate and sinus rhythm  Gastrointestinal: Soft, LLQ and left flank pain, non-distended, no rebound/guarding Musculoskeletal: no edema or wounds, motor and sensation grossly intact, NT    Labs:  CBC Latest Ref Rng & Units 08/21/2020 08/20/2020 08/19/2020  WBC 4.0 - 10.5 K/uL 6.1 12.3(H) 12.8(H)  Hemoglobin 13.0 - 17.0 g/dL 13.4 14.5 16.8  Hematocrit 39 - 52 % 41.3  45.1 52.2(H)  Platelets 150 - 400 K/uL 176 172 228   CMP Latest Ref Rng & Units 08/21/2020 08/20/2020 08/19/2020  Glucose 70 - 99 mg/dL 112(H) 114(H) 119(H)  BUN 6 - 20 mg/dL 10 13 15   Creatinine 0.61 - 1.24 mg/dL 1.43(H) 1.35(H) 1.54(H)  Sodium 135 - 145 mmol/L 136 132(L) 138  Potassium 3.5 - 5.1 mmol/L 4.2 4.1 4.4  Chloride 98 - 111 mmol/L 102 97(L) 98  CO2 22 - 32 mmol/L 26 24 26   Calcium 8.9 - 10.3 mg/dL 8.4(L) 8.5(L) 9.4  Total Protein 6.5 - 8.1 g/dL - 6.8 8.8(H)  Total Bilirubin 0.3 - 1.2 mg/dL - 1.1 1.2  Alkaline Phos 38 - 126 U/L - 63 75  AST 15 - 41 U/L - 16 18  ALT 0 - 44 U/L - 22 35     Imaging studies: No new pertinent imaging studies   Assessment/Plan: (ICD-10's: K47.92) 39 y.o. male with persistent abdominal pain and now resolved leukocytosis admitted with complicated diverticulitis with contained microperforation, complicated by previous right hemicolectomy   - Will leave NPO for now; If abdominal pain is improved later in the day we can start CLD   - Continue IV Abx (Zosyn); Day 2/14   - Monitor abdominal examination; on-going bowel function  - Pain control prn; antiemetics prn  - Monitor leukocytosis; resolved   - Pending clinical condition, may need re-imaging in 48-72 hours   - no emergent surgical intervention; he will likely benefit from evaluation for elective sigmoid colectomy in the outpatient setting however this will be challenging given his previous surgical history  - Further management per primary service; we will  follow   All of the above findings and recommendations were discussed with the patient, patient's family at bedside, and the medical team, and all of their questions were answered to their expressed satisfaction.  -- Edison Simon, PA-C [facility] 08/21/2020, 7:51 AM [phone] M-F: 7am - 4pm

## 2020-08-21 NOTE — Progress Notes (Addendum)
[facility] [place] NAME: Luke [name]    MR#:  [id number]  DATE OF BIRTH:  Apr 02, 1981  SUBJECTIVE:  came in with left flank/lower abdominal pain. No vomiting or nausea  Diarrhea. Feels hungry. Wants to try some food. Wife at bedside. No Fever REVIEW OF SYSTEMS:   Review of Systems  Constitutional: Negative for chills, fever and weight loss.  HENT: Negative for ear discharge, ear pain and nosebleeds.   Eyes: Negative for blurred vision, pain and discharge.  Respiratory: Negative for sputum production, shortness of breath, wheezing and stridor.   Cardiovascular: Negative for chest pain, palpitations, orthopnea and PND.  Gastrointestinal: Negative for abdominal pain, diarrhea, nausea and vomiting.  Genitourinary: Negative for frequency and urgency.  Musculoskeletal: Negative for back pain and joint pain.  Neurological: Negative for sensory change, speech change, focal weakness and weakness.  Psychiatric/Behavioral: Negative for depression and hallucinations. The patient is not nervous/anxious.    Tolerating Diet: clear liquid Tolerating PT: ambulatory  DRUG ALLERGIES:  No Known Allergies  VITALS:  Blood pressure 112/79, pulse 64, temperature 97.7 F (36.5 C), resp. rate 18, height 5\' 10"  (1.778 m), weight 91.5 kg, SpO2 97 %.  PHYSICAL EXAMINATION:   Physical Exam  GENERAL:  39 y.o.-year-old patient lying in the bed with no acute distress.  EYES: Pupils equal, round, reactive to light and accommodation. No scleral icterus.   HEENT: Head atraumatic, normocephalic. Oropharynx and nasopharynx clear.  NECK:  Supple, no jugular venous distention. No thyroid enlargement, no tenderness.  LUNGS: Normal breath sounds bilaterally, no wheezing, rales, rhonchi. No use of accessory muscles of respiration.  CARDIOVASCULAR: S1, S2 normal. No murmurs, rubs, or gallops.  ABDOMEN: Soft, very mild tenderness left lower quadrant only on palpation,  nondistended. Bowel sounds present. No organomegaly or mass.  EXTREMITIES: No cyanosis, clubbing or edema b/l.    NEUROLOGIC: Cranial nerves II through XII are intact. No focal Motor or sensory deficits b/l.   PSYCHIATRIC:  patient is alert and oriented x 3.  SKIN: No obvious rash, lesion, or ulcer.   LABORATORY PANEL:  CBC Recent Labs  Lab 08/21/20 0626  WBC 6.1  HGB 13.4  HCT 41.3  PLT 176    Chemistries  Recent Labs  Lab 08/20/20 0820 08/20/20 0820 08/21/20 0626  NA 132*   < > 136  K 4.1   < > 4.2  CL 97*   < > 102  CO2 24   < > 26  GLUCOSE 114*   < > 112*  BUN 13   < > 10  CREATININE 1.35*   < > 1.43*  CALCIUM 8.5*   < > 8.4*  AST 16  --   --   ALT 22  --   --   ALKPHOS 63  --   --   BILITOT 1.1  --   --    < > = values in this interval not displayed.   Cardiac Enzymes No results for input(s): TROPONINI in the last 168 hours. RADIOLOGY:  CT ABDOMEN PELVIS W CONTRAST  Result Date: 08/20/2020 CLINICAL DATA:  Left abdominal pain, nausea, vomiting, diarrhea EXAM: CT ABDOMEN AND PELVIS WITH CONTRAST TECHNIQUE: Multidetector CT imaging of the abdomen and pelvis was performed using the standard protocol following bolus administration of intravenous contrast. CONTRAST:  139mL OMNIPAQUE IOHEXOL 300 MG/ML  SOLN COMPARISON:  08/08/2018 FINDINGS: Lower chest: The visualized lung bases are clear bilaterally. The visualized heart and pericardium are unremarkable.  Tiny hiatal hernia. Hepatobiliary: No focal liver abnormality is seen. No gallstones, gallbladder wall thickening, or biliary dilatation. Pancreas: Unremarkable Spleen: Unremarkable Adrenals/Urinary Tract: Adrenal glands are unremarkable. Kidneys are normal, without renal calculi, focal lesion, or hydronephrosis. Bladder is unremarkable. Stomach/Bowel: There is extensive pericolonic inflammatory stranding, punctate foci of extraluminal gas, and circumferential focal mural thickening involving the mid descending colon in  keeping with changes of a moderate, perforated, acute diverticulitis. There is background moderate descending and sigmoid colonic diverticulosis. There is no gross free intraperitoneal gas. No loculated intra-abdominal fluid collections or free intraperitoneal fluid. There is no evidence of obstruction. Surgical changes of ileocolectomy are identified. The large and small bowel are otherwise unremarkable. Stomach unremarkable. Vascular/Lymphatic: Retroaortic left renal vein. Mild atherosclerotic calcification noted within the abdominal aorta. No aortic aneurysm. The abdominal vasculature is otherwise unremarkable. No pathologic adenopathy within the abdomen. Reproductive: Prostate is unremarkable. Other: Small fat containing direct right inguinal hernia. Infraumbilical laparotomy scar noted. Diastasis of the rectus abdominus musculature in the region of the umbilicus. Musculoskeletal: No lytic or blastic bone lesions are seen. IMPRESSION: Moderate perforated descending colonic diverticulitis with minimal pericolonic extraluminal gas. No gross free intraperitoneal gas, loculated intra-abdominal fluid collections, or evidence of obstruction. Aortic Atherosclerosis (ICD10-I70.0). Electronically Signed   By: Fidela Salisbury MD   On: 08/20/2020 08:32   ASSESSMENT AND [name]:  Luke [name] is a 39 y.o. male with medical history significant for diabetes mellitus, hypertension status post partial colectomy for colon cancer who presents to the ER for evaluation of abdominal pain mostly in the left lower quadrant with radiation to the left flank and left upper quadrant associated with nausea.    Diverticulitis of colon with contained perforation -Patient presents with left lower abdominal pain mostly i associated with nausea, vomiting and constipation -Imaging shows perforated descending colonic diverticulitis with minimal pericolonic extraluminal gas with no free intraperitoneal gas, loculated intra-abdominal fluid  collection evidence of obstruction -He does not appear toxic looking, he is not febrile but has mild leukocytosis -patient seen by general surgery. Currently does not have any abdominal pain will start clear liquid diet and observe.  -Continue PRN pain control, IV fluid hydration, antiemetics and IV antibiotic therapy with Zosyn adjusted to renal function  Hypertension Blood pressure soft-- hold amlodipine  Diabetes mellitus-2, without complication -Hold Metformin since patient is n.p.o. -IV fluid hydration -Accu-Cheks every 4 hours with sliding scale coverage  Hyperlipidemia -will resume statins at discharge    DVT prophylaxis: Lovenox Code Status: Full code Family Communication:  wife in the room disposition [name]: Back to previous home environment Consults called: Surgery  Status is: Inpatient  Remains inpatient appropriate because:IV treatments appropriate due to intensity of illness or inability to take PO and Inpatient level of care appropriate due to severity of illness   Dispo: The patient is from: Home              Anticipated d/c is to: Home              Anticipated d/c date is: 1 day              Patient currently is not medically stable to d/c.  Patient has been admitted for acute diverticulitis with contained perforation. Currently on IV antibiotic. Improving slowly. Started on clear liquid diet. Will follow surgical recommendations as far as advancement of diet and discharge planning.  Expected discharge in 1 to 2 days.     TOTAL TIME TAKING CARE OF THIS PATIENT:  25 minutes.  >50% time spent on counselling and coordination of care  Note: This dictation was prepared with Dragon dictation along with smaller phrase technology. Any transcriptional errors that result from this process are unintentional.  Fritzi Mandes M.D    [facility]   CC: Primary care physician; Pcp, NoPatient ID: Luke [name], male   DOB: August 22, 1981, 39 y.o.   MRN:  [id number]

## 2020-08-21 NOTE — Plan of Care (Signed)
Pt Axox4. Calm and cooperative and able to voice his needs. Pt denied pain on arrival to floor. Vitals stable. Pt receiving IVF and IV Abx. Pt independent with care. Strict NPO. Surgery team consulted. Safety measures in place. Will continue to monitor.  Problem: Education: Goal: Knowledge of General Education information will improve Description: Including pain rating scale, medication(s)/side effects and non-pharmacologic comfort measures Outcome: Progressing   Problem: Health Behavior/Discharge Planning: Goal: Ability to manage health-related needs will improve Outcome: Progressing   Problem: Clinical Measurements: Goal: Ability to maintain clinical measurements within normal limits will improve Outcome: Progressing Goal: Will remain free from infection Outcome: Progressing Goal: Diagnostic test results will improve Outcome: Progressing Goal: Respiratory complications will improve Outcome: Progressing Goal: Cardiovascular complication will be avoided Outcome: Progressing   Problem: Activity: Goal: Risk for activity intolerance will decrease Outcome: Progressing   Problem: Nutrition: Goal: Adequate nutrition will be maintained Outcome: Progressing   Problem: Coping: Goal: Level of anxiety will decrease Outcome: Progressing   Problem: Elimination: Goal: Will not experience complications related to bowel motility Outcome: Progressing Goal: Will not experience complications related to urinary retention Outcome: Progressing   Problem: Pain Managment: Goal: General experience of comfort will improve Outcome: Progressing   Problem: Safety: Goal: Ability to remain free from injury will improve Outcome: Progressing   Problem: Skin Integrity: Goal: Risk for impaired skin integrity will decrease Outcome: Progressing

## 2020-08-22 LAB — CBC
HCT: 45 % (ref 39.0–52.0)
Hemoglobin: 14.7 g/dL (ref 13.0–17.0)
MCH: 28.7 pg (ref 26.0–34.0)
MCHC: 32.7 g/dL (ref 30.0–36.0)
MCV: 87.7 fL (ref 80.0–100.0)
Platelets: 219 10*3/uL (ref 150–400)
RBC: 5.13 MIL/uL (ref 4.22–5.81)
RDW: 12.6 % (ref 11.5–15.5)
WBC: 5.2 10*3/uL (ref 4.0–10.5)
nRBC: 0 % (ref 0.0–0.2)

## 2020-08-22 LAB — GLUCOSE, CAPILLARY
Glucose-Capillary: 106 mg/dL — ABNORMAL HIGH (ref 70–99)
Glucose-Capillary: 119 mg/dL — ABNORMAL HIGH (ref 70–99)

## 2020-08-22 MED ORDER — AMOXICILLIN-POT CLAVULANATE 875-125 MG PO TABS: 1.0000 | ORAL_TABLET | Freq: Two times a day (BID) | ORAL | Status: DC

## 2020-08-22 MED ORDER — AMOXICILLIN-POT CLAVULANATE 875-125 MG PO TABS: 1.0000 | ORAL_TABLET | Freq: Two times a day (BID) | ORAL | 0 refills | Status: AC

## 2020-08-22 NOTE — Progress Notes (Signed)
Sepsis screen performed and is negative. Will continue to monitor. 

## 2020-08-22 NOTE — Discharge Summary (Signed)
[place] [facility] [place] NAME: Luke [name]    MR#:  [id number]  DATE OF BIRTH:  07/10/1981  DATE OF ADMISSION:  08/20/2020 ADMITTING PHYSICIAN: Collier Bullock, MD  DATE OF DISCHARGE: 08/22/2020  PRIMARY CARE PHYSICIAN: Pcp, No    ADMISSION DIAGNOSIS:  Diverticulitis [K57.92] Perforated diverticulum [K57.80] Diverticulitis of colon with perforation [K57.20]  DISCHARGE DIAGNOSIS:  Acute diverticulitis with contained perforation  SECONDARY DIAGNOSIS:   Past Medical History:  Diagnosis Date  . Anemia   . Cancer ([place])    colon ca  . Cecum mass   . Colonic neoplasm   . Diverticulosis of large intestine without diverticulitis   . GERD (gastroesophageal reflux disease)   . Hypertension   . Pneumonia 09/07/2016  . Stomach irritation     HOSPITAL COURSE:  Luke M Hesteris a 39 y.o.malewith medical history significant fordiabetes mellitus, hypertension status post partial colectomy for colon cancer who presents to the ER for evaluation of abdominal pain mostly in the left lower quadrant with radiation to the left flank and left upper quadrant associated with nausea.  Diverticulitis of colon with contained perforation -Patient presents with left lower abdominal pain mostly i associated with nausea, vomiting and constipation -Imaging shows perforated descending colonic diverticulitis with minimal pericolonic extraluminal gas with no free intraperitoneal gas, loculated intra-abdominal fluid collection evidence of obstruction -pt is not febrile but has mild leukocytosis--overall improved and tolerating po soft diet -patient seen by general surgery. Currently does not have any abdominal pain will start clear liquid diet and observe.  -Continue PRN pain control - IV antibiotic therapy with Zosyn adjusted to renal function--change to po Augmentin for 1 days  Hypertension Blood pressure stable -resume amlodipine  Diabetes  mellitus-2, without complication -resumed Metformin sliding scale coverage  Hyperlipidemia -resumed statins at discharge    DVT prophylaxis:Lovenox Code Status:Full code Family Communication: none today  Disposition Plan:Back to previous home environment Consults called:Surgery  Status is: Inpatient  Dispo: The patient is from: Home  Anticipated d/c is to: Home  Anticipated d/c date is: today  Patient currently is  medically stable to d/c.  Patient overall improved. Able to tolerate soft diet. Okay to go home from surgery standpoint. Discussed with Luke [name], Surgery PA   CONSULTS OBTAINED:  Treatment Team:  Jules Husbands, MD  DRUG ALLERGIES:  No Known Allergies  DISCHARGE MEDICATIONS:   Allergies as of 08/22/2020   No Known Allergies     Medication List    STOP taking these medications   IRON PO   VITAMIN B-12 PO     TAKE these medications   amLODipine 5 MG tablet Commonly known as: NORVASC Take 1 tablet (5 mg total) by mouth daily.   amoxicillin-clavulanate 875-125 MG tablet Commonly known as: AUGMENTIN Take 1 tablet by mouth every 12 (twelve) hours for 12 days.   blood glucose meter kit and supplies Kit Dispense based on patient and insurance preference. Use up to four times daily as directed. (FOR ICD-9 250.00, 250.01).   gabapentin 100 MG capsule Commonly known as: NEURONTIN Take 1 capsule by mouth at bedtime   metFORMIN 850 MG tablet Commonly known as: GLUCOPHAGE Take 1 tablet (850 mg total) by mouth 2 (two) times daily with a meal.   polyethylene glycol 17 g packet Commonly known as: MiraLax Take 17 g by mouth daily.   pravastatin 10 MG tablet Commonly known as: PRAVACHOL Take 1 tablet (10 mg total) by mouth daily.  If you experience worsening of your admission symptoms, develop shortness of breath, life threatening emergency, suicidal or homicidal thoughts you must seek  medical attention immediately by calling 911 or calling your MD immediately  if symptoms less severe.  You Must read complete instructions/literature along with all the possible adverse reactions/side effects for all the Medicines you take and that have been prescribed to you. Take any new Medicines after you have completely understood and accept all the possible adverse reactions/side effects.   Please note  You were cared for by a hospitalist during your hospital stay. If you have any questions about your discharge medications or the care you received while you were in the hospital after you are discharged, you can call the unit and asked to speak with the hospitalist on call if the hospitalist that took care of you is not available. Once you are discharged, your primary care physician will handle any further medical issues. Please note that NO REFILLS for any discharge medications will be authorized once you are discharged, as it is imperative that you return to your primary care physician (or establish a relationship with a primary care physician if you do not have one) for your aftercare needs so that they can reassess your need for medications and monitor your lab values. Today   SUBJECTIVE   Overall improved. No abd pain  VITAL SIGNS:  Blood pressure (!) 118/93, pulse (!) 59, temperature (!) 97.5 F (36.4 C), temperature source Oral, resp. rate 16, height _0  (1.778 m), weight 91.5 kg, SpO2 100 %.  I/O:    Intake/Output Summary (Last 24 hours) at 08/22/2020 0915 Last data filed at 08/22/2020 0909 Gross per 24 hour  Intake 360 ml  Output 450 ml  Net -90 ml    PHYSICAL EXAMINATION:  GENERAL:  39 y.o.-year-old patient lying lying in the bed with no acute distress.  EYES: Pupils equal, round, reactive to light and accommodation. No scleral icterus.  HEENT: Head atraumatic, normocephalic. Oropharynx and nasopharynx clear.  NECK:  Supple, no jugular venous distention. No thyroid  enlargement, no tenderness.  LUNGS: Normal breath sounds bilaterally, no wheezing, rales,rhonchi or crepitation. No use of accessory muscles of respiration.  CARDIOVASCULAR: S1, S2 normal. No murmurs, rubs, or gallops.  ABDOMEN: Soft, non-tender, non-distended. Bowel sounds present. No organomegaly or mass.  EXTREMITIES: No pedal edema, cyanosis, or clubbing.  NEUROLOGIC: Cranial nerves II through XII are intact. Muscle strength 5/5 in all extremities. Sensation intact. Gait not checked.  PSYCHIATRIC: The patient is alert and oriented x 3.  SKIN: No obvious rash, lesion, or ulcer.   DATA REVIEW:   CBC  Recent Labs  Lab 08/22/20 0612  WBC 5.2  HGB 14.7  HCT 45.0  PLT 219    Chemistries  Recent Labs  Lab 08/20/20 0820 08/20/20 0820 08/21/20 0626  NA 132*   < > 136  K 4.1   < > 4.2  CL 97*   < > 102  CO2 24   < > 26  GLUCOSE 114*   < > 112*  BUN 13   < > 10  CREATININE 1.35*   < > 1.43*  CALCIUM 8.5*   < > 8.4*  AST 16  --   --   ALT 22  --   --   ALKPHOS 63  --   --   BILITOT 1.1  --   --    < > = values in this interval not displayed.    Microbiology  Results   Recent Results (from the past 240 hour(s))  Blood culture (routine x 2)     Status: None (Preliminary result)   Collection Time: 08/20/20  8:53 AM   Specimen: BLOOD  Result Value Ref Range Status   Specimen Description BLOOD LEFT HAND  Final   Special Requests   Final    BOTTLES DRAWN AEROBIC AND ANAEROBIC Blood Culture adequate volume   Culture   Final    NO GROWTH 2 DAYS Performed at [facility], [address], [place], [place] [address]    Report Status PENDING  Incomplete  Blood culture (routine x 2)     Status: None (Preliminary result)   Collection Time: 08/20/20  8:53 AM   Specimen: BLOOD  Result Value Ref Range Status   Specimen Description BLOOD RIGHT HAND  Final   Special Requests   Final    BOTTLES DRAWN AEROBIC AND ANAEROBIC Blood Culture adequate volume   Culture   Final     NO GROWTH 2 DAYS Performed at [facility], [address], [place], [place] [address]    Report Status PENDING  Incomplete  Respiratory Panel by RT PCR (Flu A&B, Covid) - Nasopharyngeal Swab     Status: None   Collection Time: 08/20/20  8:53 AM   Specimen: Nasopharyngeal Swab  Result Value Ref Range Status   SARS Coronavirus 2 by RT PCR NEGATIVE NEGATIVE Final    Comment: (NOTE) SARS-CoV-2 target nucleic acids are NOT DETECTED.  The SARS-CoV-2 RNA is generally detectable in upper respiratoy specimens during the acute phase of infection. The lowest concentration of SARS-CoV-2 viral copies this assay can detect is 131 copies/mL. A negative result does not preclude SARS-Cov-2 infection and should not be used as the sole basis for treatment or other patient management decisions. A negative result may occur with  improper specimen collection/handling, submission of specimen other than nasopharyngeal swab, presence of viral mutation(s) within the areas targeted by this assay, and inadequate number of viral copies (<131 copies/mL). A negative result must be combined with clinical observations, patient history, and epidemiological information. The expected result is Negative.  Fact Sheet for Patients:  PinkCheek.be  Fact Sheet for Healthcare Providers:  GravelBags.it  This test is no t yet approved or cleared by the [place] FDA and  has been authorized for detection and/or diagnosis of SARS-CoV-2 by FDA under an Emergency Use Authorization (EUA). This EUA will remain  in effect (meaning this test can be used) for the duration of the COVID-19 declaration under Section 564(b)(1) of the Act, 21 U.S.C. section 360bbb-3(b)(1), unless the authorization is terminated or revoked sooner.     Influenza A by PCR NEGATIVE NEGATIVE Final   Influenza B by PCR NEGATIVE NEGATIVE Final    Comment: (NOTE) The Xpert Xpress  SARS-CoV-2/FLU/RSV assay is intended as an aid in  the diagnosis of influenza from Nasopharyngeal swab specimens and  should not be used as a sole basis for treatment. Nasal washings and  aspirates are unacceptable for Xpert Xpress SARS-CoV-2/FLU/RSV  testing.  Fact Sheet for Patients: PinkCheek.be  Fact Sheet for Healthcare Providers: GravelBags.it  This test is not yet approved or cleared by the [place] FDA and  has been authorized for detection and/or diagnosis of SARS-CoV-2 by  FDA under an Emergency Use Authorization (EUA). This EUA will remain  in effect (meaning this test can be used) for the duration of the  Covid-19 declaration under Section 564(b)(1) of the Act, 21  U.S.C. section 360bbb-3(b)(1), unless the authorization is  terminated or revoked. Performed at [facility], [address], [place], [place] [address]     RADIOLOGY:  No results found.   CODE STATUS:     Code Status Orders  (From admission, onward)         Start     Ordered   08/20/20 1326  Full code  Continuous        08/20/20 1327        Code Status History    Date Active Date Inactive Code Status Order ID Comments User Context   08/15/2018 1546 08/18/2018 1851 Full Code [id number]  Vickie Epley, MD Inpatient   07/04/2018 0011 07/05/2018 2001 Full Code [id number]  Amelia Jo, MD Inpatient   09/07/2016 0531 09/08/2016 1709 Full Code [id number]  Harrie Foreman, MD Inpatient   Advance Care Planning Activity       TOTAL TIME TAKING CARE OF THIS PATIENT: *40 minutes.    Fritzi Mandes M.D  [facility]    CC: Primary care physician; Pcp, No

## 2020-08-22 NOTE — Progress Notes (Signed)
[facility] SURGICAL PROGRESS NOTE (cpt [phone])  Hospital Day(s): 2.   Interval History: Patient seen and examined, no acute events or new complaints overnight. Patient reports he is feeling much better this morning, denies abdominal pain, nausea, emesis, fever, chills. Leukocytosis remains resolved now x48 hours. He was advanced to full liquid diet yesterday and he is tolerating this well. No other issues this morning.   Review of Systems:  Constitutional: denies fever, chills  HEENT: denies cough or congestion  Respiratory: denies any shortness of breath  Cardiovascular: denies chest pain or palpitations  Gastrointestinal: denies abdominal pain, N/V, or diarrhea/and bowel function as per interval history Genitourinary: denies burning with urination or urinary frequency  Vital signs in last 24 hours: [min-max] current  Temp:  [97.5 F (36.4 C)-98.2 F (36.8 C)] 97.5 F (36.4 C) (10/21 0357) Pulse Rate:  [54-64] 59 (10/21 0357) Resp:  [16-20] 16 (10/20 2326) BP: (112-131)/(77-94) 118/93 (10/21 0357) SpO2:  [97 %-100 %] 100 % (10/21 0357)     Height: 5\' 10"  (177.8 cm) Weight: 91.5 kg BMI (Calculated): 30.68   Intake/Output last 2 shifts:  10/20 0701 - 10/21 0700 In: 360 [P.O.:360] Out: 200 [Urine:200]   Physical Exam:  Constitutional: alert, cooperative and no distress  HENT: normocephalic without obvious abnormality  Eyes: PERRL, EOM's grossly intact and symmetric  Respiratory: breathing non-labored at rest  Cardiovascular: regular rate and sinus rhythm  Gastrointestinal: Soft, non-tender, non-distended, no rebound/guarding Musculoskeletal: no edema or wounds, motor and sensation grossly intact, NT     Labs:  CBC Latest Ref Rng & Units 08/22/2020 08/21/2020 08/20/2020  WBC 4.0 - 10.5 K/uL 5.2 6.1 12.3(H)  Hemoglobin 13.0 - 17.0 g/dL 14.7 13.4 14.5  Hematocrit 39 - 52 % 45.0 41.3 45.1  Platelets 150 - 400 K/uL 219 176 172   CMP Latest Ref Rng & Units  08/21/2020 08/20/2020 08/19/2020  Glucose 70 - 99 mg/dL 112(H) 114(H) 119(H)  BUN 6 - 20 mg/dL 10 13 15   Creatinine 0.61 - 1.24 mg/dL 1.43(H) 1.35(H) 1.54(H)  Sodium 135 - 145 mmol/L 136 132(L) 138  Potassium 3.5 - 5.1 mmol/L 4.2 4.1 4.4  Chloride 98 - 111 mmol/L 102 97(L) 98  CO2 22 - 32 mmol/L 26 24 26   Calcium 8.9 - 10.3 mg/dL 8.4(L) 8.5(L) 9.4  Total Protein 6.5 - 8.1 g/dL - 6.8 8.8(H)  Total Bilirubin 0.3 - 1.2 mg/dL - 1.1 1.2  Alkaline Phos 38 - 126 U/L - 63 75  AST 15 - 41 U/L - 16 18  ALT 0 - 44 U/L - 22 35     Imaging studies: No new pertinent imaging studies   Assessment/Plan: (ICD-10's: K86.92) 39 y.o. male with resolution in leukocytosis and abdominal pain admitted with complicated diverticulitis with contained microperforation, complicated by previous right hemicolectomy   - Okay for soft diet; DC IVF  - Continue IV Abx (Zosyn); Day 3/14; transition to Augmentin for home             - Monitor abdominal examination; on-going bowel function             - Pain control prn; antiemetics prn             - Monitor leukocytosis; resolved              - Pending clinical condition, may need re-imaging in 24 hours              - no emergent surgical intervention; he will  likely benefit from evaluation for elective sigmoid colectomy in the outpatient setting however this will be challenging given his previous surgical history             - Further management per primary service; we will follow    - Discharge Planning: Okay for discharge from surgical standpoint, Augmentin for home to complete 14 days total, follow up with Dr Dahlia Byes in 2-3 weeks.  All of the above findings and recommendations were discussed with the patient, patient's family, and the medical team, and all of patient's questions were answered to his expressed satisfaction.  -- Luke Simon, PA-C [facility] 08/22/2020, 7:24 AM [phone] M-F: 7am - 4pm

## 2020-08-22 NOTE — Discharge Instructions (Signed)
° °  Diverticulitis  Diverticulitis is when small pockets in your large intestine (colon) get infected or swollen. This causes stomach pain and watery poop (diarrhea). These pouches are called diverticula. They form in people who have a condition called diverticulosis. Follow these instructions at home: Medicines  Take over-the-counter and prescription medicines only as told by your doctor. These include: ? Antibiotics. ? Pain medicines. ? Fiber pills. ? Probiotics. ? Stool softeners.  Do not drive or use heavy machinery while taking prescription pain medicine.  If you were prescribed an antibiotic, take it as told. Do not stop taking it even if you feel better. General instructions   Follow a diet as told by your doctor.  When you feel better, your doctor may tell you to change your diet. You may need to eat a lot of fiber. Fiber makes it easier to poop (have bowel movements). Healthy foods with fiber include: ? Berries. ? Beans. ? Lentils. ? Green vegetables.  Exercise 3 or more times a week. Aim for 30 minutes each time. Exercise enough to sweat and make your heart beat faster.  Keep all follow-up visits as told. This is important. You may need to have an exam of the large intestine. This is called a colonoscopy. Contact a doctor if:  Your pain does not get better.  You have a hard time eating or drinking.  You are not pooping like normal. Get help right away if:  Your pain gets worse.  Your problems do not get better.  Your problems get worse very fast.  You have a fever.  You throw up (vomit) more than one time.  You have poop that is: ? Bloody. ? Black. ? Tarry. Summary  Diverticulitis is when small pockets in your large intestine (colon) get infected or swollen.  Take medicines only as told by your doctor.  Follow a diet as told by your doctor. This information is not intended to replace advice given to you by your health care provider. Make sure you  discuss any questions you have with your health care provider. Document Revised: 10/01/2017 Document Reviewed: 11/05/2016 Elsevier Patient Education  [place] to digest food Take stool softner/fiber supplement  F/u PCP Keep log of your sugars

## 2020-08-22 NOTE — Plan of Care (Signed)
Pt Axox4. Calm and cooperative and able to voice his needs. Pt denied pain all night and is tolerating his full liquid diet. Vitals stable. On RA. Pt receiving IVF D5NS at 1ml/hr and IV Abx. Pt independent with care. Accuchecks q 4hrs, no coverage needed. Safety measures in place. Will continue to monitor.   Problem: Education: Goal: Knowledge of General Education information will improve Description: Including pain rating scale, medication(s)/side effects and non-pharmacologic comfort measures Outcome: Progressing   Problem: Health Behavior/Discharge Planning: Goal: Ability to manage health-related needs will improve Outcome: Progressing   Problem: Clinical Measurements: Goal: Ability to maintain clinical measurements within normal limits will improve Outcome: Progressing Goal: Will remain free from infection Outcome: Progressing Goal: Diagnostic test results will improve Outcome: Progressing Goal: Respiratory complications will improve Outcome: Progressing Goal: Cardiovascular complication will be avoided Outcome: Progressing   Problem: Activity: Goal: Risk for activity intolerance will decrease Outcome: Progressing   Problem: Nutrition: Goal: Adequate nutrition will be maintained Outcome: Progressing   Problem: Coping: Goal: Level of anxiety will decrease Outcome: Progressing   Problem: Elimination: Goal: Will not experience complications related to bowel motility Outcome: Progressing Goal: Will not experience complications related to urinary retention Outcome: Progressing   Problem: Pain Managment: Goal: General experience of comfort will improve Outcome: Progressing   Problem: Safety: Goal: Ability to remain free from injury will improve Outcome: Progressing   Problem: Skin Integrity: Goal: Risk for impaired skin integrity will decrease Outcome: Progressing

## 2020-08-25 LAB — CULTURE, BLOOD (ROUTINE X 2)
Culture: NO GROWTH
Culture: NO GROWTH
Special Requests: ADEQUATE
Special Requests: ADEQUATE

## 2020-09-05 ENCOUNTER — Telehealth: Payer: Self-pay

## 2020-09-05 ENCOUNTER — Ambulatory Visit: Payer: Self-pay | Admitting: Gerontology

## 2020-09-05 NOTE — Telephone Encounter (Signed)
Patient's girlfriend called and rescheduled an appointment for patient.

## 2020-09-10 ENCOUNTER — Ambulatory Visit: Payer: Self-pay | Admitting: Gerontology

## 2020-09-11 ENCOUNTER — Other Ambulatory Visit: Payer: Self-pay

## 2020-09-11 ENCOUNTER — Encounter: Payer: Self-pay | Admitting: Surgery

## 2020-09-11 ENCOUNTER — Inpatient Hospital Stay: Payer: Self-pay | Attending: Oncology

## 2020-09-11 ENCOUNTER — Ambulatory Visit (INDEPENDENT_AMBULATORY_CARE_PROVIDER_SITE_OTHER): Payer: Self-pay | Admitting: Surgery

## 2020-09-11 VITALS — BP 117/83 | HR 68 | Temp 98.6°F | Ht 68.0 in | Wt 196.0 lb

## 2020-09-11 DIAGNOSIS — K5732 Diverticulitis of large intestine without perforation or abscess without bleeding: Secondary | ICD-10-CM

## 2020-09-11 DIAGNOSIS — Z800 Family history of malignant neoplasm of digestive organs: Secondary | ICD-10-CM | POA: Insufficient documentation

## 2020-09-11 DIAGNOSIS — D509 Iron deficiency anemia, unspecified: Secondary | ICD-10-CM | POA: Insufficient documentation

## 2020-09-11 DIAGNOSIS — K5900 Constipation, unspecified: Secondary | ICD-10-CM

## 2020-09-11 DIAGNOSIS — E538 Deficiency of other specified B group vitamins: Secondary | ICD-10-CM | POA: Insufficient documentation

## 2020-09-11 DIAGNOSIS — Z8701 Personal history of pneumonia (recurrent): Secondary | ICD-10-CM | POA: Insufficient documentation

## 2020-09-11 DIAGNOSIS — K5792 Diverticulitis of intestine, part unspecified, without perforation or abscess without bleeding: Secondary | ICD-10-CM | POA: Insufficient documentation

## 2020-09-11 DIAGNOSIS — Z803 Family history of malignant neoplasm of breast: Secondary | ICD-10-CM | POA: Insufficient documentation

## 2020-09-11 DIAGNOSIS — N289 Disorder of kidney and ureter, unspecified: Secondary | ICD-10-CM | POA: Insufficient documentation

## 2020-09-11 MED ORDER — METRONIDAZOLE 500 MG PO TABS: 500.0000 mg | ORAL_TABLET | Freq: Three times a day (TID) | ORAL | 0 refills | Status: AC

## 2020-09-11 MED ORDER — NEOMYCIN SULFATE 500 MG PO TABS: ORAL_TABLET | ORAL | 0 refills | Status: DC

## 2020-09-11 MED ORDER — POLYETHYLENE GLYCOL 3350 17 GM/SCOOP PO POWD: ORAL | 0 refills | Status: DC

## 2020-09-11 MED ORDER — METRONIDAZOLE 500 MG PO TABS: ORAL_TABLET | ORAL | 0 refills | Status: DC

## 2020-09-11 MED ORDER — BISACODYL 5 MG PO TBEC: DELAYED_RELEASE_TABLET | ORAL | 0 refills | Status: DC

## 2020-09-11 MED ORDER — CIPROFLOXACIN HCL 500 MG PO TABS: 500.0000 mg | ORAL_TABLET | Freq: Two times a day (BID) | ORAL | 0 refills | Status: AC

## 2020-09-11 MED ORDER — METRONIDAZOLE 500 MG PO TABS: 500.0000 mg | ORAL_TABLET | Freq: Three times a day (TID) | ORAL | 0 refills | Status: DC

## 2020-09-11 MED ORDER — CIPROFLOXACIN HCL 500 MG PO TABS: 500.0000 mg | ORAL_TABLET | Freq: Two times a day (BID) | ORAL | 0 refills | Status: DC

## 2020-09-11 NOTE — Patient Instructions (Addendum)
We will refill you antibiotic prescription to minimize any chance of infection prior to surgery. This prescription will last for 2 weeks.   We have discussed removing a portion of your damaged colon through 4 small incisions today. We have scheduled this surgery at [facility] with Dr. Dahlia Byes. Please plan a hospital stay of 3-7 days for surgery and recovery time.  We will have you complete a bowel prep prior to your surgery. Please see information provided. You have also been given a (Blue) Pre-Care Sheet with more information regarding your particular surgery. Please review all information given.  Please call our office with any questions or concerns prior to your scheduled surgery.   Laparoscopic Colectomy Laparoscopic colectomy is surgery to remove part or all of the large intestine (colon). This procedure may be used to treat several conditions, including:  Inflammation and infection of the colon (diverticulitis).  Tumors or masses in the colon.  Inflammatory bowel disease, such as Crohn disease or ulcerative colitis. Colectomy is an option when symptoms cannot be controlled with medicines.  Bleeding from the colon that cannot be controlled by another method.  Blockage or obstruction of the colon.  Tell a health care provider about:  Any allergies you have.  All medicines you are taking, including vitamins, herbs, eye drops, creams, and over-the-counter medicines.  Any problems you or family members have had with anesthetic medicines.  Any blood disorders you have.  Any surgeries you have had.  Any medical conditions you have. What are the risks? Generally, this is a safe procedure. However, problems may occur, including:  Infection.  Bleeding.  Allergic reactions to medicines or dyes.  Damage to other structures or organs.  Leaking from where the colon was sewn together.  Future blockage of the small intestines from scar tissue. Another surgery may be needed to repair  this.  Needing to convert to an open procedure. Complications such as damage to other organs or excessive bleeding may require the surgeon to convert from a laparoscopic procedure to an open procedure. This involves making a larger incision in the abdomen.  What happens before the procedure? Staying hydrated Follow instructions from your health care provider about hydration, which may include:  Up to 2 hours before the procedure - you may continue to drink clear liquids, such as water, clear fruit juice, black coffee, and plain tea.  Eating and drinking restrictions Follow instructions from your health care provider about eating and drinking, which may include:  8 hours before the procedure - stop eating heavy meals, meals with high fiber, or foods such as meat, fried foods, or fatty foods.  6 hours before the procedure - stop eating light meals or foods, such as toast or cereal.  6 hours before the procedure - stop drinking milk or drinks that contain milk.  2 hours before the procedure - stop drinking clear liquids.  Medicines  Ask your health care provider about: ? Changing or stopping your regular medicines. This is especially important if you are taking diabetes medicines or blood thinners. ? Taking medicines such as aspirin and ibuprofen. These medicines can thin your blood. Do not take these medicines before your procedure if your health care provider instructs you not to.  You may be given antibiotic medicine to clean out bacteria from your colon. Follow the directions carefully and take the medicine at the correct time. General instructions  You may be prescribed an oral bowel prep to clean out your colon in preparation for the surgery: ?  Follow instructions from your health care provider about how to do this. ? Do not eat or drink anything else after you have started the bowel prep, unless your health care provider tells you it is safe to do so.  Do not use any products  that contain nicotine or tobacco, such as cigarettes and e-cigarettes. If you need help quitting, ask your health care provider. What happens during the procedure?  To reduce your risk of infection: ? Your health care team will wash or sanitize their hands. ? Your skin will be washed with soap.  An IV tube will be inserted into one of your veins to deliver fluid and medication.  You will be given one of the following: ? A medicine to help you relax (sedative). ? A medicine to make you fall asleep (general anesthetic).  Small monitors will be connected to your body. They will be used to check your heart, blood pressure, and oxygen level.  A breathing tube may be placed into your lungs during the procedure.  A thin, flexible tube (catheter) will be placed into your bladder to drain urine.  A tube may be placed through your nose and into your stomach to drain stomach fluids (nasogastric tube, or NG tube).  Your abdomen will be filled with air so it expands. This gives the surgeon more room to operate and makes your organs easier to see.  Several small cuts (incisions) will be made in your abdomen.  A thin, lighted tube with a tiny camera on the end (laparoscope) will be put through one of the small incisions. The camera on the laparoscope will send a picture to a computer screen in the operating room. This will give the surgeon a good view inside your abdomen.  Hollow tubes will be put through the other small incisions in your abdomen. The tools that are needed for the procedure will be put through these tubes.  Clamps or staples will be put on both ends of the diseased part of the colon.  The part of the intestine between the clamps or staples will be removed.  If possible, the ends of the healthy colon that remain will be stitched (sutured) or stapled together to allow your body to pass waste (stool).  Sometimes, the remaining colon cannot be stitched back together. If this is the  case, a colostomy will be needed. If you need a colostomy: ? An opening to the outside of your body (stoma) will be made through your abdomen. ? The end of your colon will be brought to the opening. It will be stitched to the skin. ? A bag will be attached to the opening. Stool will drain into this removable bag. ? The colostomy may be temporary or permanent.  The incisions from the colectomy will be closed with sutures or staples. The procedure may vary among health care providers and hospitals. What happens after the procedure?  Your blood pressure, heart rate, breathing rate, and blood oxygen level will be monitored until the medicines you were given have worn off.  You will receive fluids through an IV tube until your bowels start to work properly.  Once your bowels are working again, you will be given clear liquids first and then solid food as tolerated.  You will be given medicines to control your pain and nausea, if needed.  Do not drive for 24 hours if you were given a sedative. This information is not intended to replace advice given to you by your health  care provider. Make sure you discuss any questions you have with your health care provider. Document Released: 01/09/2003 Document Revised: 07/20/2016 Document Reviewed: 07/20/2016 Elsevier Interactive Patient Education  2018 [employer].     Laparoscopic Colectomy, Care After This sheet gives you information about how to care for yourself after your procedure. Your health care provider may also give you more specific instructions. If you have problems or questions, contact your health care provider. What can I expect after the procedure? After your procedure, it is common to have the following:  Pain in your abdomen, especially in the incision areas. You will be given medicine to control the pain.  Tiredness. This is a normal part of the recovery process. Your energy level will return to normal over the next several  weeks.  Changes in your bowel movements, such as constipation or needing to go more often. Talk with your health care provider about how to manage this.  Follow these instructions at home: Medicines  Take over-the-counter and prescription medicines only as told by your health care provider.  Do not drive or use heavy machinery while taking prescription pain medicine.  Do not drink alcohol while taking prescription pain medicine.  If you were prescribed an antibiotic medicine, use it as told by your health care provider. Do not stop using the antibiotic even if you start to feel better. Incision care  Follow instructions from your health care provider about how to take care of your incision areas. Make sure you: ? Keep your incisions clean and dry. ? Wash your hands with soap and water before and after applying medicine to the areas, and before and after changing your bandage (dressing). If soap and water are not available, use hand sanitizer. ? Change your dressing as told by your health care provider. ? Leave stitches (sutures), skin glue, or adhesive strips in place. These skin closures may need to stay in place for 2 weeks or longer. If adhesive strip edges start to loosen and curl up, you may trim the loose edges. Do not remove adhesive strips completely unless your health care provider tells you to do that.  Do not wear tight clothing over the incisions. Tight clothing may rub and irritate the incision areas, which may cause the incisions to open.  Do not take baths, swim, or use a hot tub until your health care provider approves. Ask your health care provider if you can take showers. You may only be allowed to take sponge baths for bathing.  Check your incision area every day for signs of infection. Check for: ? More redness, swelling, or pain. ? More fluid or blood. ? Warmth. ? Pus or a bad smell. Activity  Avoid lifting anything that is heavier than 10 lb (4.5 kg) for 2 weeks  or until your health care provider says it is okay.  You may resume normal activities as told by your health care provider. Ask your health care provider what activities are safe for you.  Take rest breaks during the day as needed. Eating and drinking  Follow instructions from your health care provider about what you can eat after surgery.  To prevent or treat constipation while you are taking prescription pain medicine, your health care provider may recommend that you: ? Drink enough fluid to keep your urine clear or pale yellow. ? Take over-the-counter or prescription medicines. ? Eat foods that are high in fiber, such as fresh fruits and vegetables, whole grains, and beans. ? Limit foods  that are high in fat and processed sugars, such as fried and sweet foods. General instructions  Ask your health care provider when you will need an appointment to get your sutures or staples removed.  Keep all follow-up visits as told by your health care provider. This is important. Contact a health care provider if:  You have more redness, swelling, or pain around your incisions.  You have more fluid or blood coming from the incisions.  Your incisions feel warm to the touch.  You have pus or a bad smell coming from your incisions or your dressing.  You have a fever.  You have an incision that breaks open (edges not staying together) after sutures or staples have been removed. Get help right away if:  You develop a rash.  You have chest pain or difficulty breathing.  You have pain or swelling in your legs.  You feel light-headed or you faint.  Your abdomen swells (becomes distended).  You have nausea or vomiting.  You have blood in your stool (feces). This information is not intended to replace advice given to you by your health care provider. Make sure you discuss any questions you have with your health care provider. Document Released: 05/08/2005 Document Revised: 07/20/2016  Document Reviewed: 07/20/2016 Elsevier Interactive Patient Education  [employer].

## 2020-09-12 ENCOUNTER — Other Ambulatory Visit: Payer: Self-pay

## 2020-09-12 ENCOUNTER — Encounter: Payer: Self-pay | Admitting: Surgery

## 2020-09-12 ENCOUNTER — Telehealth: Payer: Self-pay | Admitting: Surgery

## 2020-09-12 ENCOUNTER — Telehealth: Payer: Self-pay | Admitting: Gerontology

## 2020-09-12 DIAGNOSIS — E538 Deficiency of other specified B group vitamins: Secondary | ICD-10-CM

## 2020-09-12 DIAGNOSIS — D509 Iron deficiency anemia, unspecified: Secondary | ICD-10-CM

## 2020-09-12 NOTE — Telephone Encounter (Signed)
Outgoing call is made, unable to leave a message, mailbox full.  Patient did call back once, noticed a missed call from [place] on his phone.  He was in the grocery store and asked if he could call me right back.   T/C again @ 1:43 pm, mailbox full.   If patient calls back, please advise patient of Pre-Admission date/time, COVID Testing date and Surgery date.  Surgery Date: 10/01/20 Preadmission Testing Date: 09/23/20 (Patient to arrive In person @11 :45 am) Covid Testing Date: 09/27/20 - patient advised to go to the [facility] ([place] between 8a-1p   Also patient needs to call at [phone], between 1-3:00pm the day before surgery, to find out what time to arrive for surgery.

## 2020-09-12 NOTE — Telephone Encounter (Signed)
Incoming call from patient, he is informed of dates regarding his surgery and also reminded of his bowel prep.  Patient voices understanding.

## 2020-09-12 NOTE — Progress Notes (Signed)
Outpatient Surgical Follow Up  09/12/2020  Luke [name] is an 39 y.o. male.   Chief Complaint  Patient presents with  . Hospitalization Follow-up    diverticulitis    HPI: Luke [name] is a 39 year old male well-known to me with history of recurrent complicated diverticulitis with a recent admission to the hospital for small microperforation within the descending colon.  He does have a history of prior right colectomy for unresectable polyp.  He did well for a few days after he was discharged from the hospital but now had recurrent symptoms consistent of left flank and left lower quadrant pain that is moderate intensity.  He denies any fevers any chills.  He currently is off antibiotics at this time  Past Medical History:  Diagnosis Date  . Anemia   . Cancer ([place])    colon ca  . Cecum mass   . Colonic neoplasm   . Diverticulosis of large intestine without diverticulitis   . GERD (gastroesophageal reflux disease)   . Hypertension   . Pneumonia 09/07/2016  . Stomach irritation     Past Surgical History:  Procedure Laterality Date  . COLON SURGERY    . COLONOSCOPY WITH PROPOFOL N/A 07/28/2018   Procedure: COLONOSCOPY WITH PROPOFOL;  Surgeon: Virgel Manifold, MD;  Location: [facility] ENDOSCOPY;  Service: Endoscopy;  Laterality: N/A;  . COLONOSCOPY WITH PROPOFOL N/A 10/18/2019   Procedure: COLONOSCOPY WITH PROPOFOL;  Surgeon: Virgel Manifold, MD;  Location: [facility] ENDOSCOPY;  Service: Endoscopy;  Laterality: N/A;  . ESOPHAGOGASTRODUODENOSCOPY (EGD) WITH PROPOFOL N/A 07/28/2018   Procedure: ESOPHAGOGASTRODUODENOSCOPY (EGD) WITH PROPOFOL;  Surgeon: Virgel Manifold, MD;  Location: [facility] ENDOSCOPY;  Service: Endoscopy;  Laterality: N/A;  . none    . PARTIAL COLECTOMY Right 08/15/2018   Procedure: RIGHT  COLECTOMY;  Surgeon: Vickie Epley, MD;  Location: [facility] ORS;  Service: General;  Laterality: Right;    Family History  Problem Relation Age of Onset  . CAD Maternal Grandmother    . Breast cancer Maternal Grandmother   . Breast cancer Mother   . Hypertension Mother   . Hypertension Father   . Colon cancer Paternal Uncle     Social History:  reports that he has never smoked. He has never used smokeless tobacco. He reports current alcohol use of about 1.0 standard drink of alcohol per week. He reports previous drug use. Drugs: Marijuana and Cocaine.  Allergies: No Known Allergies  Medications reviewed.  ROS Full ROS performed and is otherwise negative other than what is stated in HPI  BP 117/83   Pulse 68   Temp 98.6 F (37 C)   Ht 5\' 8"  (1.727 m)   Wt 196 lb (88.9 kg)   SpO2 95%   BMI 29.80 kg/m   Physical Exam Vitals and nursing note reviewed. Exam conducted with a chaperone present.  Constitutional:      General: He is not in acute distress.    Appearance: Normal appearance. He is normal weight. He is not ill-appearing.  Cardiovascular:     Rate and Rhythm: Normal rate and regular rhythm.  Pulmonary:     Effort: Pulmonary effort is normal. No respiratory distress.     Breath sounds: Normal breath sounds. No stridor. No wheezing.  Abdominal:     General: Abdomen is flat. There is no distension.     Palpations: Abdomen is soft. There is no mass.     Tenderness: There is no abdominal tenderness. There is no guarding or rebound.  Hernia: No hernia is present.  Musculoskeletal:        General: No swelling or tenderness. Normal range of motion.     Cervical back: Normal range of motion and neck supple. No rigidity or tenderness.  Lymphadenopathy:     Cervical: No cervical adenopathy.  Skin:    General: Skin is warm and dry.     Capillary Refill: Capillary refill takes less than 2 seconds.  Neurological:     General: No focal deficit present.     Mental Status: He is alert and oriented to person, place, and time.  Psychiatric:        Mood and Affect: Mood normal.        Behavior: Behavior normal.        Thought Content: Thought content  normal.        Judgment: Judgment normal.      Assessment/Plan: 39 year old with recurrent chronic diverticulitis with prior perforation.  He has persistent symptoms.  I do think that he will need definitive sigmoid colectomy/left colectomy given the location of his diverticulitis.  I am going to give him another 2-week antibiotic regimen in hopes to control his diverticulitis.  Currently there is no evidence of peritonitis he is not toxic and does not need immediate surgical intervention.  Discussed with the patient in detail about timing and he wishes to wait until after Thanksgiving.  I do think that we will need to address the descending colon as well as sigmoid colon given that those are areas of diverticulitis with active infection.  He has a history of right colectomy that made things more challenging.  I have also discussed with him in detail that if I thought that the remaining transverse colon would not be healthy enough to create an anastomosis with the rectum that I will have to revise that anastomosis and ileoproctostomy.  She discussed with the patient in detail.  Risks, benefits and possible complications including but not limited to: Bleeding, infection injury to adjacent structures, anastomotic leak, need for ostomy.  He understands and wishes to proceed.  Extensive counseling provided  Greater than 50% of the 45 minutes  visit was spent in counseling/coordination of care   Caroleen Hamman, MD [place] Surgeon

## 2020-09-13 ENCOUNTER — Inpatient Hospital Stay (HOSPITAL_BASED_OUTPATIENT_CLINIC_OR_DEPARTMENT_OTHER): Payer: Self-pay | Admitting: Oncology

## 2020-09-13 ENCOUNTER — Inpatient Hospital Stay: Payer: Self-pay

## 2020-09-13 ENCOUNTER — Encounter: Payer: Self-pay | Admitting: Oncology

## 2020-09-13 VITALS — BP 128/90 | HR 64 | Temp 98.2°F | Resp 18 | Wt 196.3 lb

## 2020-09-13 DIAGNOSIS — K5792 Diverticulitis of intestine, part unspecified, without perforation or abscess without bleeding: Secondary | ICD-10-CM

## 2020-09-13 DIAGNOSIS — E538 Deficiency of other specified B group vitamins: Secondary | ICD-10-CM

## 2020-09-13 DIAGNOSIS — D509 Iron deficiency anemia, unspecified: Secondary | ICD-10-CM

## 2020-09-13 LAB — CBC WITH DIFFERENTIAL/PLATELET
Abs Immature Granulocytes: 0.01 10*3/uL (ref 0.00–0.07)
Basophils Absolute: 0.1 10*3/uL (ref 0.0–0.1)
Basophils Relative: 1 %
Eosinophils Absolute: 0.2 10*3/uL (ref 0.0–0.5)
Eosinophils Relative: 4 %
HCT: 46.2 % (ref 39.0–52.0)
Hemoglobin: 15.3 g/dL (ref 13.0–17.0)
Immature Granulocytes: 0 %
Lymphocytes Relative: 47 %
Lymphs Abs: 2.6 10*3/uL (ref 0.7–4.0)
MCH: 28.9 pg (ref 26.0–34.0)
MCHC: 33.1 g/dL (ref 30.0–36.0)
MCV: 87.2 fL (ref 80.0–100.0)
Monocytes Absolute: 0.6 10*3/uL (ref 0.1–1.0)
Monocytes Relative: 10 %
Neutro Abs: 2.1 10*3/uL (ref 1.7–7.7)
Neutrophils Relative %: 38 %
Platelets: 181 10*3/uL (ref 150–400)
RBC: 5.3 MIL/uL (ref 4.22–5.81)
RDW: 13.1 % (ref 11.5–15.5)
WBC: 5.5 10*3/uL (ref 4.0–10.5)
nRBC: 0 % (ref 0.0–0.2)

## 2020-09-13 LAB — IRON AND TIBC
Iron: 57 ug/dL (ref 45–182)
Saturation Ratios: 19 % (ref 17.9–39.5)
TIBC: 297 ug/dL (ref 250–450)
UIBC: 240 ug/dL

## 2020-09-13 LAB — FERRITIN: Ferritin: 82 ng/mL (ref 24–336)

## 2020-09-13 LAB — VITAMIN B12: Vitamin B-12: 230 pg/mL (ref 180–914)

## 2020-09-13 NOTE — Progress Notes (Signed)
Patient here for oncology follow-up appointment, expresses concerns of stomach pain.

## 2020-09-13 NOTE — Progress Notes (Signed)
Hematology/Oncology Follow Up Note [facility]  Telephone:([phone] [phone]  Patient Care Team: Pcp, No as PCP - General Rosana Hoes Arn Medal, MD (Inactive) as Consulting Physician (General Surgery) Virgel Manifold, MD as Consulting Physician (Gastroenterology)   Name of the patient: Luke [name]  [id number]  [birth year]   REASON FOR VISIT  follow-up of anemia.  PERTINENT ONCOLOGY HISTORY Dylyn DAMARIO GILLIE is a 39 y.o. male who has above history reviewed by me today presents for follow up visit for management of severe iron deficiency anemia Problems and complaints are listed below:  Patient previously follows up with Dr. Mike Gip and is switched his care to me on 03/10/2019. Extensive past medical records, images, pathology reports reviewed was performed.  07/28/2018 EGD reviewed esophageal mucosal changes suspicious for short segment Barrett's esophagus. Erythematous mucosa in the antrum, gastric polyps, small bowel biopsy was negative.  There was mild reactive gastropathy with minimal chronic inactive inflammation.  GE junction reviewed squamous: GE mucosa reviewed squamocolumnar mucosa with chronic inflammation. 07/28/2018 colonoscopy reviewed 20 cm length villous in obstructing large mass in the cecum.  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  There was no high-grade dysplasia or malignancy.  CEA was 2.8 on 08/04/2018 and a 2.7 on 08/12/2018. Patient underwent a right colectomy on 08/15/2018 which revealed a 4.8 cm pedunculated villous adenoma in the cecum associated with [place] black tattoo.  There was a 1.2 cm pedunculated adenoma in the ascending colon.  There was an incidental hyperplastic polyp in the appendix.  There were 20 regional lymph nodes without pathological changes.  Patient has chronic B12 deficiency.  Intrinsic factor and antiparietal cell antibodies were negative.  Patient is taking oral B12 supplements.  Patient has  chronic renal insufficiency.  Patient is taking oral iron supplementation for iron deficiency.  Reports being compliant with iron supplementation.   Reports feeling very fatigued and tired which is getting worse. Denies hematochezia, hematuria, hematemesis, epistaxis, black tarry stool or easy bruising.    INTERVAL HISTORY 39 y.o. male with history of iron deficiency anemia, B12 deficiency, renal insufficiency, cecum villous adenoma presents for follow-up. Recently admitted due to recurrent diverticulitis and microperforation.  He was seen by Dr.Pabon during the admission and after hospitalization. Dr.Pabon recommended definitive sigmoid colectomy /left colectomy.  Patient has chronic left side pain.   Review of Systems  Constitutional: Negative for appetite change, chills, fatigue, fever and unexpected weight change.  HENT:   Negative for hearing loss and voice change.   Eyes: Negative for eye problems and icterus.  Respiratory: Negative for chest tightness, cough and shortness of breath.   Cardiovascular: Negative for chest pain and leg swelling.  Gastrointestinal: Negative for abdominal distention and abdominal pain.  Endocrine: Negative for hot flashes.  Genitourinary: Negative for difficulty urinating, dysuria and frequency.   Musculoskeletal: Negative for arthralgias.  Skin: Negative for itching and rash.  Neurological: Negative for light-headedness and numbness.  Hematological: Negative for adenopathy. Does not bruise/bleed easily.  Psychiatric/Behavioral: Negative for confusion.      No Known Allergies   Past Medical History:  Diagnosis Date  . Anemia   . Cancer ([place])    colon ca  . Cecum mass   . Colonic neoplasm   . Diverticulosis of large intestine without diverticulitis   . GERD (gastroesophageal reflux disease)   . Hypertension   . Pneumonia 09/07/2016  . Stomach irritation      Past Surgical History:  Procedure Laterality Date  . COLON  SURGERY    .  COLONOSCOPY WITH PROPOFOL N/A 07/28/2018   Procedure: COLONOSCOPY WITH PROPOFOL;  Surgeon: Virgel Manifold, MD;  Location: [facility] ENDOSCOPY;  Service: Endoscopy;  Laterality: N/A;  . COLONOSCOPY WITH PROPOFOL N/A 10/18/2019   Procedure: COLONOSCOPY WITH PROPOFOL;  Surgeon: Virgel Manifold, MD;  Location: [facility] ENDOSCOPY;  Service: Endoscopy;  Laterality: N/A;  . ESOPHAGOGASTRODUODENOSCOPY (EGD) WITH PROPOFOL N/A 07/28/2018   Procedure: ESOPHAGOGASTRODUODENOSCOPY (EGD) WITH PROPOFOL;  Surgeon: Virgel Manifold, MD;  Location: [facility] ENDOSCOPY;  Service: Endoscopy;  Laterality: N/A;  . none    . PARTIAL COLECTOMY Right 08/15/2018   Procedure: RIGHT  COLECTOMY;  Surgeon: Vickie Epley, MD;  Location: [facility] ORS;  Service: General;  Laterality: Right;    Social History   Socioeconomic History  . Marital status: Single    Spouse name: Not on file  . Number of children: Not on file  . Years of education: Not on file  . Highest education level: Not on file  Occupational History  . Not on file  Tobacco Use  . Smoking status: Never Smoker  . Smokeless tobacco: Never Used  . Tobacco comment: patient is social smoker/ smokes when he drinks   Vaping Use  . Vaping Use: Never used  Substance and Sexual Activity  . Alcohol use: Yes    Alcohol/week: 1.0 standard drink    Types: 1 Cans of beer per week    Comment: patient reports he consumes ~2 beers about once per month  . Drug use: Not Currently    Types: Marijuana, Cocaine  . Sexual activity: Yes    Birth control/protection: Condom  Other Topics Concern  . Not on file  Social History Narrative  . Not on file   Social Determinants of Health   Financial Resource Strain:   . Difficulty of Paying Living Expenses: Not on file  Food Insecurity:   . Worried About Charity fundraiser in the Last Year: Not on file  . Ran Out of Food in the Last Year: Not on file  Transportation Needs:   . Lack of Transportation (Medical): Not on  file  . Lack of Transportation (Non-Medical): Not on file  Physical Activity:   . Days of Exercise per Week: Not on file  . Minutes of Exercise per Session: Not on file  Stress:   . Feeling of Stress : Not on file  Social Connections:   . Frequency of Communication with Friends and Family: Not on file  . Frequency of Social Gatherings with Friends and Family: Not on file  . Attends Religious Services: Not on file  . Active Member of Clubs or Organizations: Not on file  . Attends Archivist Meetings: Not on file  . Marital Status: Not on file  Intimate Partner Violence:   . Fear of Current or Ex-Partner: Not on file  . Emotionally Abused: Not on file  . Physically Abused: Not on file  . Sexually Abused: Not on file    Family History  Problem Relation Age of Onset  . CAD Maternal Grandmother   . Breast cancer Maternal Grandmother   . Breast cancer Mother   . Hypertension Mother   . Hypertension Father   . Colon cancer Paternal Uncle      Current Outpatient Medications:  .  amLODipine (NORVASC) 5 MG tablet, Take 1 tablet (5 mg total) by mouth daily., Disp: 90 tablet, Rfl: 1 .  bisacodyl (DULCOLAX) 5 MG EC tablet, Take all 4  tablets at 8 am the morning prior to your surgery., Disp: 4 tablet, Rfl: 0 .  blood glucose meter kit and supplies KIT, Dispense based on patient and insurance preference. Use up to four times daily as directed. (FOR ICD-9 250.00, 250.01)., Disp: 1 each, Rfl: 0 .  ciprofloxacin (CIPRO) 500 MG tablet, Take 1 tablet (500 mg total) by mouth 2 (two) times daily for 14 days., Disp: 28 tablet, Rfl: 0 .  gabapentin (NEURONTIN) 100 MG capsule, Take 1 capsule by mouth at bedtime (Patient taking differently: Take 1 capsule by mouth at bedtime), Disp: 30 capsule, Rfl: 0 .  metFORMIN (GLUCOPHAGE) 850 MG tablet, Take 1 tablet (850 mg total) by mouth 2 (two) times daily with a meal., Disp: 60 tablet, Rfl: 2 .  metroNIDAZOLE (FLAGYL) 500 MG tablet, Take 1 tablet  (500 mg total) by mouth 3 (three) times daily for 10 days., Disp: 42 tablet, Rfl: 0 .  metroNIDAZOLE (FLAGYL) 500 MG tablet, Take 2 tablets at 8AM, take 2 tablets at [facility], and take 2 tablets at 8PM the day prior to your surgery, Disp: 6 tablet, Rfl: 0 .  neomycin (MYCIFRADIN) 500 MG tablet, Take 2 tablet at 8am, take 2 tablets at 2pm, and take 2 tablets at 8pm the day prior to your surgery, Disp: 6 tablet, Rfl: 0 .  polyethylene glycol (MIRALAX) 17 g packet, Take 17 g by mouth daily., Disp: 14 each, Rfl: 0 .  polyethylene glycol powder (MIRALAX) 17 GM/SCOOP powder, Mix full container in 64 ounces of Gatorade or other clear liquid. NO RED, Disp: 238 g, Rfl: 0 .  pravastatin (PRAVACHOL) 10 MG tablet, Take 1 tablet (10 mg total) by mouth daily., Disp: 30 tablet, Rfl: 0  Physical exam:  Vitals:   09/13/20 1509  BP: 128/90  Pulse: 64  Resp: 18  Temp: 98.2 F (36.8 C)  TempSrc: Tympanic  SpO2: 100%  Weight: 196 lb 4.8 oz (89 kg)   Physical Exam Constitutional:      General: He is not in acute distress. HENT:     Head: Normocephalic and atraumatic.  Eyes:     General: No scleral icterus.    Pupils: Pupils are equal, round, and reactive to light.  Cardiovascular:     Rate and Rhythm: Normal rate and regular rhythm.     Heart sounds: Normal heart sounds.  Pulmonary:     Effort: Pulmonary effort is normal. No respiratory distress.     Breath sounds: No wheezing.  Abdominal:     General: Bowel sounds are normal. There is no distension.     Palpations: Abdomen is soft. There is no mass.     Tenderness: There is no abdominal tenderness.  Musculoskeletal:        General: No deformity. Normal range of motion.     Cervical back: Normal range of motion and neck supple.  Skin:    General: Skin is warm and dry.     Findings: No erythema or rash.  Neurological:     Mental Status: He is alert and oriented to person, place, and time.     Cranial Nerves: No cranial nerve deficit.      Coordination: Coordination normal.  Psychiatric:        Behavior: Behavior normal.        Thought Content: Thought content normal.     CMP Latest Ref Rng & Units 08/21/2020  Glucose 70 - 99 mg/dL 112(H)  BUN 6 - 20 mg/dL 10  Creatinine 0.61 -  1.24 mg/dL 1.43(H)  Sodium 135 - 145 mmol/L 136  Potassium 3.5 - 5.1 mmol/L 4.2  Chloride 98 - 111 mmol/L 102  CO2 22 - 32 mmol/L 26  Calcium 8.9 - 10.3 mg/dL 8.4(L)  Total Protein 6.5 - 8.1 g/dL -  Total Bilirubin 0.3 - 1.2 mg/dL -  Alkaline Phos 38 - 126 U/L -  AST 15 - 41 U/L -  ALT 0 - 44 U/L -   CBC Latest Ref Rng & Units 09/13/2020  WBC 4.0 - 10.5 K/uL 5.5  Hemoglobin 13.0 - 17.0 g/dL 15.3  Hematocrit 39 - 52 % 46.2  Platelets 150 - 400 K/uL 181    CT ABDOMEN PELVIS W CONTRAST  Result Date: 08/20/2020 CLINICAL DATA:  Left abdominal pain, nausea, vomiting, diarrhea EXAM: CT ABDOMEN AND PELVIS WITH CONTRAST TECHNIQUE: Multidetector CT imaging of the abdomen and pelvis was performed using the standard protocol following bolus administration of intravenous contrast. CONTRAST:  145m OMNIPAQUE IOHEXOL 300 MG/ML  SOLN COMPARISON:  08/08/2018 FINDINGS: Lower chest: The visualized lung bases are clear bilaterally. The visualized heart and pericardium are unremarkable. Tiny hiatal hernia. Hepatobiliary: No focal liver abnormality is seen. No gallstones, gallbladder wall thickening, or biliary dilatation. Pancreas: Unremarkable Spleen: Unremarkable Adrenals/Urinary Tract: Adrenal glands are unremarkable. Kidneys are normal, without renal calculi, focal lesion, or hydronephrosis. Bladder is unremarkable. Stomach/Bowel: There is extensive pericolonic inflammatory stranding, punctate foci of extraluminal gas, and circumferential focal mural thickening involving the mid descending colon in keeping with changes of a moderate, perforated, acute diverticulitis. There is background moderate descending and sigmoid colonic diverticulosis. There is no gross  free intraperitoneal gas. No loculated intra-abdominal fluid collections or free intraperitoneal fluid. There is no evidence of obstruction. Surgical changes of ileocolectomy are identified. The large and small bowel are otherwise unremarkable. Stomach unremarkable. Vascular/Lymphatic: Retroaortic left renal vein. Mild atherosclerotic calcification noted within the abdominal aorta. No aortic aneurysm. The abdominal vasculature is otherwise unremarkable. No pathologic adenopathy within the abdomen. Reproductive: Prostate is unremarkable. Other: Small fat containing direct right inguinal hernia. Infraumbilical laparotomy scar noted. Diastasis of the rectus abdominus musculature in the region of the umbilicus. Musculoskeletal: No lytic or blastic bone lesions are seen. IMPRESSION: Moderate perforated descending colonic diverticulitis with minimal pericolonic extraluminal gas. No gross free intraperitoneal gas, loculated intra-abdominal fluid collections, or evidence of obstruction. Aortic Atherosclerosis (ICD10-I70.0). Electronically Signed   By: AFidela SalisburyMD   On: 08/20/2020 08:32     Assessment and plan  1. Diverticulitis    Patient feels undecided and not sure if he wants to proceed with his colectomy surgery. I recommend him to talk to his gastroenterologist for discussion of what to expect if he has very short segment of colon left or no colon left after surgery.   Iron deficiency anemia has resolved.  No need to further follow up with me.     ZEarlie Server MD, PhD Hematology Oncology CLeesburg Regional Medical Centerat AVibra Specialty HospitalPager- [phone]/10/2020

## 2020-09-23 ENCOUNTER — Other Ambulatory Visit: Payer: Self-pay

## 2020-09-23 ENCOUNTER — Other Ambulatory Visit
Admission: RE | Admit: 2020-09-23 | Discharge: 2020-09-23 | Disposition: A | Discharge status: Home / Self Care | Payer: Self-pay | Source: Ambulatory Visit | Attending: Surgery | Admitting: Surgery

## 2020-09-23 DIAGNOSIS — Z01818 Encounter for other preprocedural examination: Secondary | ICD-10-CM | POA: Insufficient documentation

## 2020-09-23 DIAGNOSIS — E119 Type 2 diabetes mellitus without complications: Secondary | ICD-10-CM | POA: Insufficient documentation

## 2020-09-23 DIAGNOSIS — F1411 Cocaine abuse, in remission: Secondary | ICD-10-CM | POA: Insufficient documentation

## 2020-09-23 HISTORY — DX: Type 2 diabetes mellitus without complications: E11.9

## 2020-09-23 HISTORY — DX: Chronic kidney disease, unspecified: N18.9

## 2020-09-23 LAB — BASIC METABOLIC PANEL
Anion gap: 2 — ABNORMAL LOW (ref 5–15)
BUN: 12 mg/dL (ref 6–20)
CO2: 29 mmol/L (ref 22–32)
Calcium: 8.8 mg/dL — ABNORMAL LOW (ref 8.9–10.3)
Chloride: 107 mmol/L (ref 98–111)
Creatinine, Ser: 1.46 mg/dL — ABNORMAL HIGH (ref 0.61–1.24)
GFR, Estimated: >60 mL/min (ref >60)
Glucose, Bld: 114 mg/dL — ABNORMAL HIGH (ref 70–99)
Potassium: 4.1 mmol/L (ref 3.5–5.1)
Sodium: 138 mmol/L (ref 135–145)

## 2020-09-23 LAB — TYPE AND SCREEN
ABO/RH(D): A POS
Antibody Screen: NEGATIVE

## 2020-09-23 NOTE — Consult Note (Signed)
[facility] Nurse requested for preoperative stoma site marking  Discussed surgical procedure and stoma creation with patient and family.  Explained role of the [place] nurse team.  Provided the patient with educational booklet and provided samples of pouching options.  Answered patient and family questions.   Examined patient lying, sitting, and standing in order to place the marking in the patient's visual field, away from any creases or abdominal contour issues and within the rectus muscle.  Attempted to mark below the patient's belt line.   Marked for colostomy in the LLQ  _5___ cm to the left of the umbilicus and ___2_JN the umbilicus.  Patient's abdomen cleansed with CHG wipes at site markings, allowed to air dry prior to marking.Covered mark with thin film transparent dressing to preserve mark until date of surgery.   [facility] Nurse team will follow up with patient after surgery for continue ostomy care and teaching.  [place] MSN, [place], [place], [place]

## 2020-09-23 NOTE — Patient Instructions (Addendum)
Your procedure is scheduled on: 10/01/20 Report to [facility], CHECK IN AT ADMITTING FIRST. To find out your arrival time please call [phone] between 1PM - 3PM on 09/30/20.  Remember: Instructions that are not followed completely may result in serious medical risk, up to and including death, or upon the discretion of your surgeon and anesthesiologist your surgery may need to be rescheduled.     _X__ 1. FOLLOW DR PABON'S DIET AND COLON PREP  INSTRUCTIONS __X__2.  On the morning of surgery brush your teeth with toothpaste and water, you                 may rinse your mouth with mouthwash if you wish.  Do not swallow any              toothpaste of mouthwash.     _X__ 3.  No Alcohol for 24 hours before or after surgery.   _X__ 4.  Do Not Smoke or use e-cigarettes For 24 Hours Prior to Your Surgery.                 Do not use any chewable tobacco products for at least 6 hours prior to                 surgery.  ____  5.  Bring all medications with you on the day of surgery if instructed.   __X__  6.  Notify your doctor if there is any change in your medical condition      (cold, fever, infections).     Do not wear jewelry, make-up, hairpins, clips or nail polish. Do not wear lotions, powders, or perfumes.  Do not shave 48 hours prior to surgery. Men may shave face and neck. Do not bring valuables to the hospital.    [facility] is not responsible for any belongings or valuables.  Contacts, dentures/partials or body piercings may not be worn into surgery. Bring a case for your contacts, glasses or hearing aids, a denture cup will be supplied. Leave your suitcase in the car. After surgery it may be brought to your room. For patients admitted to the hospital, discharge time is determined by your treatment team.   Patients discharged the day of surgery will not be allowed to drive home.   Please read over the following fact sheets  that you were given:   MRSA Information  __X__ Take these medicines the morning of surgery with A SIP OF WATER:    1. amLODipine (NORVASC) 5 MG tablet  2. pravastatin (PRAVACHOL) 10 MG tablet  3.   4.  5.  6.  ____ Fleet Enema (as directed)   __X__ Use CHG Soap/SAGE wipes as directed  ____ Use inhalers on the day of surgery  __X__ Stop metformin/Janumet/Farxiga 2 days prior to surgery LAST DOSE SATURDAY    ____ Take 1/2 of usual insulin dose the night before surgery. No insulin the morning          of surgery.   ____ Stop Blood Thinners Coumadin/Plavix/Xarelto/Pleta/Pradaxa/Eliquis/Effient/Aspirin  on   Or contact your Surgeon, Cardiologist or Medical Doctor regarding  ability to stop your blood thinners  __X__ Stop Anti-inflammatories 7 days before surgery such as Advil, Ibuprofen, Motrin,  BC or Goodies Powder, Naprosyn, Naproxen, Aleve, Aspirin    __X__ Stop all herbal supplements, fish oil or vitamin E until after surgery.    ____ Bring C-Pap to the hospital.

## 2020-09-24 ENCOUNTER — Ambulatory Visit: Payer: Self-pay | Admitting: Gerontology

## 2020-09-24 ENCOUNTER — Encounter: Payer: Self-pay | Admitting: Gastroenterology

## 2020-09-24 ENCOUNTER — Ambulatory Visit (INDEPENDENT_AMBULATORY_CARE_PROVIDER_SITE_OTHER): Payer: Self-pay | Admitting: Gastroenterology

## 2020-09-24 VITALS — BP 116/80 | HR 69 | Temp 98.3°F | Ht 68.0 in | Wt 198.0 lb

## 2020-09-24 DIAGNOSIS — K5732 Diverticulitis of large intestine without perforation or abscess without bleeding: Secondary | ICD-10-CM

## 2020-09-24 NOTE — Progress Notes (Signed)
Vonda Antigua, MD [address]  [place]  [place], [place] [address]  Main: [phone]  Fax: [phone]   Primary Care Physician: Pcp, No   Chief Complaint  Patient presents with  . Diverticulitis    HPI: Luke [name] is a 39 y.o. male recently diagnosed with diverticulitis with microperforation, with previous history of right colectomy in 2019 due to endoscopically unresectable colon polyp that showed villous adenoma, here for follow-up.  Patient reports 1 formed bowel movement a day without blood.  No straining.  Denies constipation.  Patient saw Dr. Dahlia Byes during his hospitalization for diverticulitis and he recommended left-sided/sigmoid colectomy which is scheduled for the next couple weeks.  Last colonoscopy December 2020.  Please see procedure report for details.  Current Outpatient Medications  Medication Sig Dispense Refill  . amLODipine (NORVASC) 5 MG tablet Take 1 tablet (5 mg total) by mouth daily. 90 tablet 1  . bisacodyl (DULCOLAX) 5 MG EC tablet Take all 4 tablets at 8 am the morning prior to your surgery. 4 tablet 0  . blood glucose meter kit and supplies KIT Dispense based on patient and insurance preference. Use up to four times daily as directed. (FOR ICD-9 250.00, 250.01). 1 each 0  . ciprofloxacin (CIPRO) 500 MG tablet Take 1 tablet (500 mg total) by mouth 2 (two) times daily for 14 days. 28 tablet 0  . metFORMIN (GLUCOPHAGE) 850 MG tablet Take 1 tablet (850 mg total) by mouth 2 (two) times daily with a meal. 60 tablet 2  . metroNIDAZOLE (FLAGYL) 500 MG tablet Take 2 tablets at 8AM, take 2 tablets at [facility], and take 2 tablets at 8PM the day prior to your surgery 6 tablet 0  . neomycin (MYCIFRADIN) 500 MG tablet Take 2 tablet at 8am, take 2 tablets at 2pm, and take 2 tablets at 8pm the day prior to your surgery 6 tablet 0  . polyethylene glycol (MIRALAX) 17 g packet Take 17 g by mouth daily. (Patient taking differently: Take 17 g by mouth daily as  needed for moderate constipation. ) 14 each 0  . gabapentin (NEURONTIN) 100 MG capsule Take 1 capsule by mouth at bedtime (Patient not taking: Reported on 09/24/2020) 30 capsule 0  . pravastatin (PRAVACHOL) 10 MG tablet Take 1 tablet (10 mg total) by mouth daily. (Patient not taking: Reported on 09/24/2020) 30 tablet 0   No current facility-administered medications for this visit.    Allergies as of 09/24/2020  . (No Known Allergies)    ROS:  General: Negative for anorexia, weight loss, fever, chills, fatigue, weakness. ENT: Negative for hoarseness, difficulty swallowing , nasal congestion. CV: Negative for chest pain, angina, palpitations, dyspnea on exertion, peripheral edema.  Respiratory: Negative for dyspnea at rest, dyspnea on exertion, cough, sputum, wheezing.  GI: See history of present illness. GU:  Negative for dysuria, hematuria, urinary incontinence, urinary frequency, nocturnal urination.  Endo: Negative for unusual weight change.    Physical Examination:   BP 116/80   Pulse 69   Temp 98.3 F (36.8 C) (Oral)   Ht '5\' 8"'  (1.727 m)   Wt 198 lb (89.8 kg)   BMI 30.11 kg/m   General: Well-nourished, well-developed in no acute distress.  Eyes: No icterus. Conjunctivae pink. Mouth: Oropharyngeal mucosa moist and pink , no lesions erythema or exudate. Neck: Supple, Trachea midline Abdomen: Bowel sounds are normal, nontender, nondistended, no hepatosplenomegaly or masses, no abdominal bruits or hernia , no rebound or guarding.   Extremities:  No lower extremity edema. No clubbing or deformities. Neuro: Alert and oriented x 3.  Grossly intact. Skin: Warm and dry, no jaundice.   Psych: Alert and cooperative, normal mood and affect.   Labs: CMP     Component Value Date/Time   NA 138 09/23/2020 1330   NA 141 03/27/2020 1104   NA 138 04/23/2013 2126   K 4.1 09/23/2020 1330   K 3.6 04/23/2013 2126   CL 107 09/23/2020 1330   CL 103 04/23/2013 2126   CO2 29 09/23/2020  1330   CO2 26 04/23/2013 2126   GLUCOSE 114 (H) 09/23/2020 1330   GLUCOSE 188 (H) 04/23/2013 2126   BUN 12 09/23/2020 1330   BUN 9 03/27/2020 1104   BUN 15 04/23/2013 2126   CREATININE 1.46 (H) 09/23/2020 1330   CREATININE 1.78 (H) 04/23/2013 2126   CALCIUM 8.8 (L) 09/23/2020 1330   CALCIUM 9.2 04/23/2013 2126   PROT 6.8 08/20/2020 0820   PROT 6.9 03/27/2020 1104   PROT 7.9 04/23/2013 2126   ALBUMIN 3.3 (L) 08/20/2020 0820   ALBUMIN 4.2 03/27/2020 1104   ALBUMIN 3.8 04/23/2013 2126   AST 16 08/20/2020 0820   AST 13 (L) 04/23/2013 2126   ALT 22 08/20/2020 0820   ALT 20 04/23/2013 2126   ALKPHOS 63 08/20/2020 0820   ALKPHOS 69 04/23/2013 2126   BILITOT 1.1 08/20/2020 0820   BILITOT 0.5 03/27/2020 1104   BILITOT 0.4 04/23/2013 2126   GFRNONAA >60 09/23/2020 1330   GFRNONAA 49 (L) 04/23/2013 2126   GFRAA 84 03/27/2020 1104   GFRAA 57 (L) 04/23/2013 2126   Lab Results  Component Value Date   WBC 5.5 09/13/2020   HGB 15.3 09/13/2020   HCT 46.2 09/13/2020   MCV 87.2 09/13/2020   PLT 181 09/13/2020    Imaging Studies: No results found.  Assessment and Plan:   Luke [name] is a 39 y.o. y/o male with recent admission for diverticulitis  Surgery has recommended sigmoid colectomy due to the location of his diverticulitis, and given his already complicated episode with microperforation on recent admission  I have recommended that he follow-up with Dr. Dahlia Byes closely in regard to his surgery.  We discussed that he may have change in bowel habits after surgery that may or may not be short-lived.  He may need medications if this occurs and I have encouraged him to follow-up with [place] if it does  His colonoscopy is otherwise recent and up-to-date and he does not need another colonoscopy prior to his surgery  Patient and family verbalized understanding and all their questions were answered to their satisfaction    Dr Vonda Antigua

## 2020-09-27 ENCOUNTER — Other Ambulatory Visit: Admission: RE | Admit: 2020-09-27 | Payer: Self-pay | Source: Ambulatory Visit

## 2020-09-30 ENCOUNTER — Telehealth: Payer: Self-pay | Admitting: Surgery

## 2020-09-30 ENCOUNTER — Other Ambulatory Visit: Admission: RE | Admit: 2020-09-30 | Payer: Self-pay | Source: Ambulatory Visit

## 2020-09-30 ENCOUNTER — Other Ambulatory Visit: Payer: Self-pay

## 2020-09-30 DIAGNOSIS — R319 Hematuria, unspecified: Secondary | ICD-10-CM

## 2020-09-30 NOTE — Telephone Encounter (Signed)
Patient calls stating that he is on his way now to the ER due to a lot of bleeding in his urine.  Chat is sent to Dr. Dahlia Byes to inform.  Dr. Dahlia Byes called the patient and we will cancel surgery for 10/01/20 and place referral to urology.  Patient is informed of this and we will reschedule surgery to a later date.  Spoke with Will in the OR and surgery is removed from 10/01/20.

## 2020-10-01 ENCOUNTER — Inpatient Hospital Stay: Admission: RE | Admit: 2020-10-01 | Payer: Self-pay | Source: Home / Self Care | Admitting: Surgery

## 2020-10-01 ENCOUNTER — Encounter: Admission: RE | Payer: Self-pay | Source: Home / Self Care

## 2020-10-01 SURGERY — COLECTOMY, SIGMOID, ROBOT-ASSISTED
Anesthesia: General | Laterality: Left

## 2020-10-11 ENCOUNTER — Encounter: Payer: Self-pay | Admitting: Urology

## 2020-10-11 ENCOUNTER — Ambulatory Visit (INDEPENDENT_AMBULATORY_CARE_PROVIDER_SITE_OTHER): Payer: Self-pay | Admitting: Urology

## 2020-10-11 ENCOUNTER — Other Ambulatory Visit: Payer: Self-pay

## 2020-10-11 VITALS — BP 119/83 | HR 73 | Ht 68.0 in | Wt 198.0 lb

## 2020-10-11 DIAGNOSIS — R310 Gross hematuria: Secondary | ICD-10-CM

## 2020-10-11 LAB — URINALYSIS, COMPLETE
Bilirubin, UA: NEGATIVE
Glucose, UA: NEGATIVE
Ketones, UA: NEGATIVE
Leukocytes,UA: NEGATIVE
Nitrite, UA: NEGATIVE
RBC, UA: NEGATIVE
Specific Gravity, UA: >1.03 — ABNORMAL HIGH (ref 1.005–1.030)
Urobilinogen, Ur: 0.2 mg/dL (ref 0.2–1.0)
pH, UA: 5 (ref 5.0–7.5)

## 2020-10-11 LAB — MICROSCOPIC EXAMINATION: RBC, Urine: NONE SEEN /hpf (ref 0–2)

## 2020-10-11 NOTE — Progress Notes (Signed)
10/11/2020 9:36 AM   Jousha Particia Nearing November 08, 1980 [id number]  Referring provider: No referring provider defined for this encounter.  Chief Complaint  Patient presents with  . Hematuria    HPI: Luke [name] is a 39 y.o. male seen at request of Dr. 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 Anemia   . Cancer ([place])    colon ca  . Cecum mass   . Chronic kidney disease   . Colonic neoplasm   . Diabetes mellitus without complication ([place])   . Diverticulosis of large intestine without diverticulitis   . GERD (gastroesophageal reflux disease)   . Hypertension   . Pneumonia 09/07/2016  . Stomach irritation     Surgical History: Past Surgical History:  Procedure Laterality Date  . COLON SURGERY    . COLONOSCOPY WITH PROPOFOL N/A 07/28/2018   Procedure: COLONOSCOPY WITH PROPOFOL;  Surgeon: Virgel Manifold, MD;  Location: [facility] END[facility];  Service: Endoscopy;  Laterality: N/A;  . COLONOSCOPY WITH PROPOFOL N/A 10/18/2019   Procedure: COLONOSCOPY WITH PROPOFOL;  Surgeon: Virgel Manifold, MD;  Location: [facility] ENDOSCOPY;  Service: Endoscopy;  Laterality: N/A;  . ESOPHAGOGASTRODUODENOSCOPY (EGD) WITH PROPOFOL N/A 07/28/2018   Procedure: ESOPHAGOGASTRODUODENOSCOPY (EGD) WITH PROPOFOL;  Surgeon: Virgel Manifold, MD;  Location: [facility] ENDOSCOPY;  Service: Endoscopy;  Laterality: N/A;  . none    . PARTIAL COLECTOMY Right  08/15/2018   Procedure: RIGHT  COLECTOMY;  Surgeon: Vickie Epley, MD;  Location: [facility] ORS;  Service: General;  Laterality: Right;    Home Medications:  Allergies as of 10/11/2020   No Known Allergies     Medication List       Accurate as of October 11, 2020  9:36 AM. If you have any questions, ask your nurse or doctor.        STOP taking these medications   metroNIDAZOLE 500 MG tablet Commonly known as: FLAGYL Stopped by: Abbie Sons, MD   neomycin 500 MG tablet Commonly known as: MYCIFRADIN Stopped by: Abbie Sons, MD     TAKE these medications   amLODipine 5 MG tablet Commonly known as: NORVASC Take 1 tablet (5 mg total) by mouth daily.   bisacodyl 5 MG EC tablet Commonly known as: DULCOLAX Take all 4 tablets at 8 am the morning prior to your surgery.   blood glucose meter kit and supplies Kit Dispense based on patient and insurance preference. Use up to four times daily as directed. (FOR ICD-9 250.00, 250.01).   gabapentin 100 MG capsule Commonly known as: NEURONTIN Take 1 capsule by mouth at bedtime   metFORMIN 850 MG tablet Commonly known as: GLUCOPHAGE Take 1 tablet (850 mg total) by mouth 2 (two) times daily with a meal.   polyethylene glycol 17 g packet Commonly known as: MiraLax Take 17 g by mouth daily. What changed:   when to take this  reasons to take this   pravastatin 10 MG tablet Commonly known as: PRAVACHOL Take 1 tablet (10 mg total)  by mouth daily.       Allergies: No Known Allergies  Family History: Family History  Problem Relation Age of Onset  . CAD Maternal Grandmother   . Breast cancer Maternal Grandmother   . Breast cancer Mother   . Hypertension Mother   . Hypertension Father   . Colon cancer Paternal Uncle     Social History:  reports that he has been smoking cigarettes. He has been smoking about 0.10 packs per day. He has never used smokeless tobacco. He reports current alcohol use of about 1.0  standard drink of alcohol per week. He reports previous drug use. Drugs: Marijuana and Cocaine.   Physical Exam: BP 119/83   Pulse 73   Ht '5\' 8"'  (1.727 m)   Wt 198 lb (89.8 kg)   BMI 30.11 kg/m   Constitutional:  Alert and oriented, No acute distress. HEENT: [place] AT, moist mucus membranes.  Trachea midline, no masses. Cardiovascular: No clubbing, cyanosis, or edema. Respiratory: Normal respiratory effort, no increased work of breathing. GU: Phallus without lesions, meatus normal in appearance.  Testes descended bilateral without masses or tenderness Skin: No rashes, bruises or suspicious lesions. Neurologic: Grossly intact, no focal deficits, moving all 4 extremities. Psychiatric: Normal mood and affect.  Laboratory Data:  Urinalysis Dipstick/microscopy negative  Pertinent Imaging: CT images 08/20/2020 personally reviewed and interpreted.  No genitourinary abnormalities noted   Assessment & Plan:    1. Gross hematuria  Isolated episode which has resolved  AUA risk stratification: High  We discussed potential etiologies; unlikely upper tract in etiology with recent CT with contrast negative  We discussed the recommended evaluation for high risk hematuria including CT urogram for detailed upper tract evaluation and cystoscopy for lower tract evaluation  All questions were answered and he desires to proceed with evaluation   Abbie Sons, MD  [facility] [address] [place], [place] [address] [phone]

## 2020-10-12 ENCOUNTER — Encounter: Payer: Self-pay | Admitting: Urology

## 2020-11-07 ENCOUNTER — Ambulatory Visit
Admission: RE | Admit: 2020-11-07 | Discharge: 2020-11-07 | Disposition: A | Discharge status: Home / Self Care | Payer: Self-pay | Source: Ambulatory Visit | Attending: Urology | Admitting: Urology

## 2020-11-07 ENCOUNTER — Other Ambulatory Visit: Payer: Self-pay

## 2020-11-07 DIAGNOSIS — R310 Gross hematuria: Secondary | ICD-10-CM | POA: Insufficient documentation

## 2020-11-07 LAB — POCT I-STAT CREATININE: Creatinine, Ser: 1.2 mg/dL (ref 0.61–1.24)

## 2020-11-07 MED ORDER — IOHEXOL 300 MG/ML  SOLN
125.0000 mL | Freq: Once | INTRAMUSCULAR | Status: AC | PRN
  Administered 2020-11-07: 150 mL via INTRAVENOUS

## 2020-11-11 ENCOUNTER — Encounter: Payer: Self-pay | Admitting: Urology

## 2020-11-11 ENCOUNTER — Other Ambulatory Visit: Payer: Self-pay | Admitting: Urology

## 2020-11-11 ENCOUNTER — Ambulatory Visit (INDEPENDENT_AMBULATORY_CARE_PROVIDER_SITE_OTHER): Payer: Self-pay | Admitting: Urology

## 2020-11-11 ENCOUNTER — Other Ambulatory Visit: Payer: Self-pay

## 2020-11-11 VITALS — BP 130/93 | HR 69 | Ht 68.0 in | Wt 198.0 lb

## 2020-11-11 DIAGNOSIS — R310 Gross hematuria: Secondary | ICD-10-CM

## 2020-11-11 MED ORDER — LIDOCAINE HCL URETHRAL/MUCOSAL 2 % EX GEL
1.0000 "application " | Freq: Once | CUTANEOUS | Status: AC
  Administered 2020-11-11: 1 via URETHRAL

## 2020-11-11 NOTE — Progress Notes (Signed)
   11/11/20  CC:  Chief Complaint  Patient presents with  . Cysto   Indications: High risk hematuria  HPI: Denies recurrent gross hematuria  Blood pressure (!) 130/93, pulse 69, height 5\' 8"  (1.727 m), weight 198 lb (89.8 kg). NED. A&Ox3.   No respiratory distress   Abd soft, NT, ND Normal phallus with bilateral descended testicles  Imaging: CTU shows no upper tract abnormalities  Cystoscopy Procedure Note  Patient identification was confirmed, informed consent was obtained, and patient was prepped using Betadine solution.  Lidocaine jelly was administered per urethral meatus.     Pre-Procedure: - Inspection reveals a normal caliber urethral meatus.  Procedure: The flexible cystoscope was introduced without difficulty - No urethral strictures/lesions are present. - Mild lateral lobe large amount with hypervascularity and friability   - Mild elevation bladder neck - Bilateral ureteral orifices identified - Bladder mucosa  reveals no ulcers, tumors, or lesions - No bladder stones - No trabeculation  Retroflexion shows hypervascularity of the bladder neck   Post-Procedure: - Patient tolerated the procedure well  Assessment/ Plan:  No bladder mucosal abnormalities identified  Hypervascularity/friability of the prostate  Urine cytology sent  Follow-up 6 months, earlier for recurrent gross hematuria   Abbie Sons, MD

## 2020-11-12 LAB — CYTOLOGY - NON PAP

## 2020-11-13 ENCOUNTER — Telehealth: Payer: Self-pay | Admitting: Family Medicine

## 2020-11-13 NOTE — Telephone Encounter (Signed)
-----   Message from Abbie Sons, MD sent at 11/13/2020  7:27 AM EST ----- Urine cytology negative for abnormal cells

## 2020-11-13 NOTE — Telephone Encounter (Signed)
Patient notified and voiced understanding.

## 2020-11-29 ENCOUNTER — Telehealth: Payer: Self-pay | Admitting: Surgery

## 2020-11-29 NOTE — Telephone Encounter (Signed)
To date several attempts have been made to see if patient is ready to reschedule his surgery.  No return call back from patient.  Patient will need follow up with Dr Dahlia Byes prior to rescheduling.

## 2020-12-04 ENCOUNTER — Ambulatory Visit: Payer: Self-pay | Admitting: Adult Health

## 2020-12-04 ENCOUNTER — Other Ambulatory Visit: Payer: Self-pay

## 2020-12-05 ENCOUNTER — Encounter: Payer: Self-pay | Admitting: Adult Health

## 2020-12-18 ENCOUNTER — Encounter: Payer: Self-pay | Admitting: Gerontology

## 2020-12-18 ENCOUNTER — Other Ambulatory Visit: Payer: Self-pay

## 2020-12-18 ENCOUNTER — Ambulatory Visit: Payer: Self-pay | Admitting: Gerontology

## 2020-12-18 VITALS — BP 134/90 | HR 87 | Temp 97.0°F | Resp 16 | Wt 198.2 lb

## 2020-12-18 DIAGNOSIS — E1140 Type 2 diabetes mellitus with diabetic neuropathy, unspecified: Secondary | ICD-10-CM

## 2020-12-18 DIAGNOSIS — E119 Type 2 diabetes mellitus without complications: Secondary | ICD-10-CM

## 2020-12-18 DIAGNOSIS — I10 Essential (primary) hypertension: Secondary | ICD-10-CM

## 2020-12-18 DIAGNOSIS — E785 Hyperlipidemia, unspecified: Secondary | ICD-10-CM

## 2020-12-18 LAB — POCT GLYCOSYLATED HEMOGLOBIN (HGB A1C)
HbA1c POC (<> result, manual entry): 9.9 % (ref 4.0–5.6)
HbA1c, POC (controlled diabetic range): 0 % (ref 0.0–7.0)
HbA1c, POC (prediabetic range): 0 % — AB (ref 5.7–6.4)
Hemoglobin A1C: 9.9 % — AB (ref 4.0–5.6)

## 2020-12-18 LAB — GLUCOSE, POCT (MANUAL RESULT ENTRY): POC Glucose: 580 mg/dl — AB (ref 70–99)

## 2020-12-18 MED ORDER — GABAPENTIN 100 MG PO CAPS: 100.0000 mg | ORAL_CAPSULE | Freq: Every day | ORAL | 0 refills | Status: DC

## 2020-12-18 MED ORDER — METFORMIN HCL 850 MG PO TABS: 850.0000 mg | ORAL_TABLET | Freq: Two times a day (BID) | ORAL | 2 refills | Status: DC

## 2020-12-18 MED ORDER — BASAGLAR KWIKPEN 100 UNIT/ML ~~LOC~~ SOPN: 10.0000 [IU] | PEN_INJECTOR | Freq: Every day | SUBCUTANEOUS | 1 refills | Status: DC

## 2020-12-18 MED ORDER — PRAVASTATIN SODIUM 10 MG PO TABS: 10.0000 mg | ORAL_TABLET | Freq: Every day | ORAL | 0 refills | Status: DC

## 2020-12-18 MED ORDER — AMLODIPINE BESYLATE 5 MG PO TABS: 5.0000 mg | ORAL_TABLET | Freq: Every day | ORAL | 1 refills | Status: DC

## 2020-12-18 NOTE — Patient Instructions (Signed)
Heart-Healthy Eating Plan Heart-healthy meal planning includes:  Eating less unhealthy fats.  Eating more healthy fats.  Making other changes in your diet. Talk with your doctor or a diet specialist (dietitian) to create an eating plan that is right for you. What is my plan? Your doctor may recommend an eating plan that includes:  Total fat: ______% or less of total calories a day.  Saturated fat: ______% or less of total calories a day.  Cholesterol: less than _________mg a day. What are tips for following this plan? Cooking Avoid frying your food. Try to bake, boil, grill, or broil it instead. You can also reduce fat by:  Removing the skin from poultry.  Removing all visible fats from meats.  Steaming vegetables in water or broth. Meal planning  At meals, divide your plate into four equal parts: ? Fill one-half of your plate with vegetables and green salads. ? Fill one-fourth of your plate with whole grains. ? Fill one-fourth of your plate with lean protein foods.  Eat 4-5 servings of vegetables per day. A serving of vegetables is: ? 1 cup of raw or cooked vegetables. ? 2 cups of raw leafy greens.  Eat 4-5 servings of fruit per day. A serving of fruit is: ? 1 medium whole fruit. ?  cup of dried fruit. ?  cup of fresh, frozen, or canned fruit. ?  cup of 100% fruit juice.  Eat more foods that have soluble fiber. These are apples, broccoli, carrots, beans, peas, and barley. Try to get 20-30 g of fiber per day.  Eat 4-5 servings of nuts, legumes, and seeds per week: ? 1 serving of dried beans or legumes equals  cup after being cooked. ? 1 serving of nuts is  cup. ? 1 serving of seeds equals 1 tablespoon.   General information  Eat more home-cooked food. Eat less restaurant, buffet, and fast food.  Limit or avoid alcohol.  Limit foods that are high in starch and sugar.  Avoid fried foods.  Lose weight if you are overweight.  Keep track of how much salt  (sodium) you eat. This is important if you have high blood pressure. Ask your doctor to tell you more about this.  Try to add vegetarian meals each week. Fats  Choose healthy fats. These include olive oil and canola oil, flaxseeds, walnuts, almonds, and seeds.  Eat more omega-3 fats. These include salmon, mackerel, sardines, tuna, flaxseed oil, and ground flaxseeds. Try to eat fish at least 2 times each week.  Check food labels. Avoid foods with trans fats or high amounts of saturated fat.  Limit saturated fats. ? These are often found in animal products, such as meats, butter, and cream. ? These are also found in plant foods, such as palm oil, palm kernel oil, and coconut oil.  Avoid foods with partially hydrogenated oils in them. These have trans fats. Examples are stick margarine, some tub margarines, cookies, crackers, and other baked goods. What foods can I eat? Fruits All fresh, canned (in natural juice), or frozen fruits. Vegetables Fresh or frozen vegetables (raw, steamed, roasted, or grilled). Green salads. Grains Most grains. Choose whole wheat and whole grains most of the time. Rice and pasta, including brown rice and pastas made with whole wheat. Meats and other proteins Lean, well-trimmed beef, veal, pork, and lamb. Chicken and turkey without skin. All fish and shellfish. Wild duck, rabbit, pheasant, and venison. Egg whites or low-cholesterol egg substitutes. Dried beans, peas, lentils, and tofu. Seeds and   most nuts. Dairy Low-fat or nonfat cheeses, including ricotta and mozzarella. Skim or 1% milk that is liquid, powdered, or evaporated. Buttermilk that is made with low-fat milk. Nonfat or low-fat yogurt. Fats and oils Non-hydrogenated (trans-free) margarines. Vegetable oils, including soybean, sesame, sunflower, olive, peanut, safflower, corn, canola, and cottonseed. Salad dressings or mayonnaise made with a vegetable oil. Beverages Mineral water. Coffee and tea. Diet  carbonated beverages. Sweets and desserts Sherbet, gelatin, and fruit ice. Small amounts of dark chocolate. Limit all sweets and desserts. Seasonings and condiments All seasonings and condiments. The items listed above may not be a complete list of foods and drinks you can eat. Contact a dietitian for more options. What foods should I avoid? Fruits Canned fruit in heavy syrup. Fruit in cream or butter sauce. Fried fruit. Limit coconut. Vegetables Vegetables cooked in cheese, cream, or butter sauce. Fried vegetables. Grains Breads that are made with saturated or trans fats, oils, or whole milk. Croissants. Sweet rolls. Donuts. High-fat crackers, such as cheese crackers. Meats and other proteins Fatty meats, such as hot dogs, ribs, sausage, bacon, rib-eye roast or steak. High-fat deli meats, such as salami and bologna. Caviar. Domestic duck and goose. Organ meats, such as liver. Dairy Cream, sour cream, cream cheese, and creamed cottage cheese. Whole-milk cheeses. Whole or 2% milk that is liquid, evaporated, or condensed. Whole buttermilk. Cream sauce or high-fat cheese sauce. Yogurt that is made from whole milk. Fats and oils Meat fat, or shortening. Cocoa butter, hydrogenated oils, palm oil, coconut oil, palm kernel oil. Solid fats and shortenings, including bacon fat, salt pork, lard, and butter. Nondairy cream substitutes. Salad dressings with cheese or sour cream. Beverages Regular sodas and juice drinks with added sugar. Sweets and desserts Frosting. Pudding. Cookies. Cakes. Pies. Milk chocolate or white chocolate. Buttered syrups. Full-fat ice cream or ice cream drinks. The items listed above may not be a complete list of foods and drinks to avoid. Contact a dietitian for more information. Summary  Heart-healthy meal planning includes eating less unhealthy fats, eating more healthy fats, and making other changes in your diet.  Eat a balanced diet. This includes fruits and  vegetables, low-fat or nonfat dairy, lean protein, nuts and legumes, whole grains, and heart-healthy oils and fats. This information is not intended to replace advice given to you by your health care provider. Make sure you discuss any questions you have with your health care provider. Document Revised: 12/23/2017 Document Reviewed: 11/26/2017 Elsevier Patient Education  2021 [place]. [email or web address]">  DASH Eating Plan DASH stands for Dietary Approaches to Stop Hypertension. The DASH eating plan is a healthy eating plan that has been shown to:  Reduce high blood pressure (hypertension).  Reduce your risk for type 2 diabetes, heart disease, and stroke.  Help with weight loss. What are tips for following this plan? Reading food labels  Check food labels for the amount of salt (sodium) per serving. Choose foods with less than 5 percent of the Daily Value of sodium. Generally, foods with less than 300 milligrams (mg) of sodium per serving fit into this eating plan.  To find whole grains, look for the word "whole" as the first word in the ingredient list. Shopping  Buy products labeled as "low-sodium" or "no salt added."  Buy fresh foods. Avoid canned foods and pre-made or frozen meals. Cooking  Avoid adding salt when cooking. Use salt-free seasonings or herbs instead of table salt or sea salt. Check with your health care provider or pharmacist before  using salt substitutes.  Do not fry foods. Cook foods using healthy methods such as baking, boiling, grilling, roasting, and broiling instead.  Cook with heart-healthy oils, such as olive, canola, avocado, soybean, or sunflower oil. Meal planning  Eat a balanced diet that includes: ? 4 or more servings of fruits and 4 or more servings of vegetables each day. Try to fill one-half of your plate with fruits and vegetables. ? 6-8 servings of whole grains each day. ? Less than 6 oz (170  g) of lean meat, poultry, or fish each day. A 3-oz (85-g) serving of meat is about the same size as a deck of cards. One egg equals 1 oz (28 g). ? 2-3 servings of low-fat dairy each day. One serving is 1 cup (237 mL). ? 1 serving of nuts, seeds, or beans 5 times each week. ? 2-3 servings of heart-healthy fats. Healthy fats called omega-3 fatty acids are found in foods such as walnuts, flaxseeds, fortified milks, and eggs. These fats are also found in cold-water fish, such as sardines, salmon, and mackerel.  Limit how much you eat of: ? Canned or prepackaged foods. ? Food that is high in trans fat, such as some fried foods. ? Food that is high in saturated fat, such as fatty meat. ? Desserts and other sweets, sugary drinks, and other foods with added sugar. ? Full-fat dairy products.  Do not salt foods before eating.  Do not eat more than 4 egg yolks a week.  Try to eat at least 2 vegetarian meals a week.  Eat more home-cooked food and less restaurant, buffet, and fast food.   Lifestyle  When eating at a restaurant, ask that your food be prepared with less salt or no salt, if possible.  If you drink alcohol: ? Limit how much you use to:  0-1 drink a day for women who are not pregnant.  0-2 drinks a day for men. ? Be aware of how much alcohol is in your drink. In the [place] one drink equals one 12 oz bottle of beer (355 mL), one 5 oz glass of wine (148 mL), or one 1 oz glass of hard liquor (44 mL). General information  Avoid eating more than 2,300 mg of salt a day. If you have hypertension, you may need to reduce your sodium intake to 1,500 mg a day.  Work with your health care provider to maintain a healthy body weight or to lose weight. Ask what an ideal weight is for you.  Get at least 30 minutes of exercise that causes your heart to beat faster (aerobic exercise) most days of the week. Activities may include walking, swimming, or biking.  Work with your health care provider or  dietitian to adjust your eating plan to your individual calorie needs. What foods should I eat? Fruits All fresh, dried, or frozen fruit. Canned fruit in natural juice (without added sugar). Vegetables Fresh or frozen vegetables (raw, steamed, roasted, or grilled). Low-sodium or reduced-sodium tomato and vegetable juice. Low-sodium or reduced-sodium tomato sauce and tomato paste. Low-sodium or reduced-sodium canned vegetables. Grains Whole-grain or whole-wheat bread. Whole-grain or whole-wheat pasta. Brown rice. [place]. Bulgur. Whole-grain and low-sodium cereals. Pita bread. Low-fat, low-sodium crackers. Whole-wheat flour tortillas. Meats and other proteins Skinless chicken or [place]. Ground chicken or [place]. Pork with fat trimmed off. Fish and seafood. Egg whites. Dried beans, peas, or lentils. Unsalted nuts, nut butters, and seeds. Unsalted canned beans. Lean cuts of beef with fat trimmed off. Low-sodium,  lean precooked or cured meat, such as sausages or meat loaves. Dairy Low-fat (1%) or fat-free (skim) milk. Reduced-fat, low-fat, or fat-free cheeses. Nonfat, low-sodium ricotta or cottage cheese. Low-fat or nonfat yogurt. Low-fat, low-sodium cheese. Fats and oils Soft margarine without trans fats. Vegetable oil. Reduced-fat, low-fat, or light mayonnaise and salad dressings (reduced-sodium). Canola, safflower, olive, avocado, soybean, and sunflower oils. Avocado. Seasonings and condiments Herbs. Spices. Seasoning mixes without salt. Other foods Unsalted popcorn and pretzels. Fat-free sweets. The items listed above may not be a complete list of foods and beverages you can eat. Contact a dietitian for more information. What foods should I avoid? Fruits Canned fruit in a light or heavy syrup. Fried fruit. Fruit in cream or butter sauce. Vegetables Creamed or fried vegetables. Vegetables in a cheese sauce. Regular canned vegetables (not low-sodium or reduced-sodium). Regular canned  tomato sauce and paste (not low-sodium or reduced-sodium). Regular tomato and vegetable juice (not low-sodium or reduced-sodium). Pickles. Olives. Grains Baked goods made with fat, such as croissants, muffins, or some breads. Dry pasta or rice meal packs. Meats and other proteins Fatty cuts of meat. Ribs. Fried meat. Bacon. Bologna, salami, and other precooked or cured meats, such as sausages or meat loaves. Fat from the back of a pig (fatback). Bratwurst. Salted nuts and seeds. Canned beans with added salt. Canned or smoked fish. Whole eggs or egg yolks. Chicken or turkey with skin. Dairy Whole or 2% milk, cream, and half-and-half. Whole or full-fat cream cheese. Whole-fat or sweetened yogurt. Full-fat cheese. Nondairy creamers. Whipped toppings. Processed cheese and cheese spreads. Fats and oils Butter. Stick margarine. Lard. Shortening. Ghee. Bacon fat. Tropical oils, such as coconut, palm kernel, or palm oil. Seasonings and condiments Onion salt, garlic salt, seasoned salt, table salt, and sea salt. Worcestershire sauce. Tartar sauce. Barbecue sauce. Teriyaki sauce. Soy sauce, including reduced-sodium. Steak sauce. Canned and packaged gravies. Fish sauce. Oyster sauce. Cocktail sauce. Store-bought horseradish. Ketchup. Mustard. Meat flavorings and tenderizers. Bouillon cubes. Hot sauces. Pre-made or packaged marinades. Pre-made or packaged taco seasonings. Relishes. Regular salad dressings. Other foods Salted popcorn and pretzels. The items listed above may not be a complete list of foods and beverages you should avoid. Contact a dietitian for more information. Where to find more information  National Heart, Lung, and Blood Institute: www.nhlbi.nih.gov  American Heart Association: www.heart.org  Academy of Nutrition and Dietetics: www.eatright.org  National Kidney Foundation: www.kidney.org Summary  The DASH eating plan is a healthy eating plan that has been shown to reduce high blood  pressure (hypertension). It may also reduce your risk for type 2 diabetes, heart disease, and stroke.  When on the DASH eating plan, aim to eat more fresh fruits and vegetables, whole grains, lean proteins, low-fat dairy, and heart-healthy fats.  With the DASH eating plan, you should limit salt (sodium) intake to 2,300 mg a day. If you have hypertension, you may need to reduce your sodium intake to 1,500 mg a day.  Work with your health care provider or dietitian to adjust your eating plan to your individual calorie needs. This information is not intended to replace advice given to you by your health care provider. Make sure you discuss any questions you have with your health care provider. Document Revised: 09/22/2019 Document Reviewed: 09/22/2019 Elsevier Patient Education  2021 Else[employer] Inc. https://www.diabeteseducator.org/docs/default-source/living-with-diabetes/conquering-the-grocery-store-v1.pdf?sfvrsn=4">  Carbohydrate Counting for Diabetes Mellitus, Adult Carbohydrate counting is a method of keeping track of how many carbohydrates you eat. Eating carbohydrates naturally increases the amount of sugar (glucose) in   the blood. Counting how many carbohydrates you eat improves your blood glucose control, which helps you manage your diabetes. It is important to know how many carbohydrates you can safely have in each meal. This is different for every person. A dietitian can help you make a meal plan and calculate how many carbohydrates you should have at each meal and snack. What foods contain carbohydrates? Carbohydrates are found in the following foods:  Grains, such as breads and cereals.  Dried beans and soy products.  Starchy vegetables, such as potatoes, peas, and corn.  Fruit and fruit juices.  Milk and yogurt.  Sweets and snack foods, such as cake, cookies, candy, chips, and soft drinks.   How do I count carbohydrates in foods? There are two ways to count carbohydrates in food.  You can read food labels or learn standard serving sizes of foods. You can use either of the methods or a combination of both. Using the Nutrition Facts label The Nutrition Facts list is included on the labels of almost all packaged foods and beverages in the [place] It includes:  The serving size.  Information about nutrients in each serving, including the grams (g) of carbohydrate per serving. To use the Nutrition Facts:  Decide how many servings you will have.  Multiply the number of servings by the number of carbohydrates per serving.  The resulting number is the total amount of carbohydrates that you will be having. Learning the standard serving sizes of foods When you eat carbohydrate foods that are not packaged or do not include Nutrition Facts on the label, you need to measure the servings in order to count the amount of carbohydrates.  Measure the foods that you will eat with a food scale or measuring cup, if needed.  Decide how many standard-size servings you will eat.  Multiply the number of servings by 15. For foods that contain carbohydrates, one serving equals 15 g of carbohydrates. ? For example, if you eat 2 cups or 10 oz (300 g) of strawberries, you will have eaten 2 servings and 30 g of carbohydrates (2 servings x 15 g = 30 g).  For foods that have more than one food mixed, such as soups and casseroles, you must count the carbohydrates in each food that is included. The following list contains standard serving sizes of common carbohydrate-rich foods. Each of these servings has about 15 g of carbohydrates:  1 slice of bread.  1 six-inch (15 cm) tortilla.  ? cup or 2 oz (53 g) cooked rice or pasta.   cup or 3 oz (85 g) cooked or canned, drained and rinsed beans or lentils.   cup or 3 oz (85 g) starchy vegetable, such as peas, corn, or squash.   cup or 4 oz (120 g) hot cereal.   cup or 3 oz (85 g) boiled or mashed potatoes, or  or 3 oz (85 g) of a large baked  potato.   cup or 4 fl oz (118 mL) fruit juice.  1 cup or 8 fl oz (237 mL) milk.  1 small or 4 oz (106 g) apple.   or 2 oz (63 g) of a medium banana.  1 cup or 5 oz (150 g) strawberries.  3 cups or 1 oz (24 g) popped popcorn. What is an example of carbohydrate counting? To calculate the number of carbohydrates in this sample meal, follow the steps shown below. Sample meal  3 oz (85 g) chicken breast.  ? cup or 4   oz (106 g) brown rice.   cup or 3 oz (85 g) corn.  1 cup or 8 fl oz (237 mL) milk.  1 cup or 5 oz (150 g) strawberries with sugar-free whipped topping. Carbohydrate calculation 1. Identify the foods that contain carbohydrates: ? Rice. ? Corn. ? Milk. ? Strawberries. 2. Calculate how many servings you have of each food: ? 2 servings rice. ? 1 serving corn. ? 1 serving milk. ? 1 serving strawberries. 3. Multiply each number of servings by 15 g: ? 2 servings rice x 15 g = 30 g. ? 1 serving corn x 15 g = 15 g. ? 1 serving milk x 15 g = 15 g. ? 1 serving strawberries x 15 g = 15 g. 4. Add together all of the amounts to find the total grams of carbohydrates eaten: ? 30 g + 15 g + 15 g + 15 g = 75 g of carbohydrates total. What are tips for following this plan? Shopping  Develop a meal plan and then make a shopping list.  Buy fresh and frozen vegetables, fresh and frozen fruit, dairy, eggs, beans, lentils, and whole grains.  Look at food labels. Choose foods that have more fiber and less sugar.  Avoid processed foods and foods with added sugars. Meal planning  Aim to have the same amount of carbohydrates at each meal and for each snack time.  Plan to have regular, balanced meals and snacks. Where to find more information  American Diabetes Association: www.diabetes.org  Centers for Disease Control and Prevention: www.cdc.gov Summary  Carbohydrate counting is a method of keeping track of how many carbohydrates you eat.  Eating carbohydrates  naturally increases the amount of sugar (glucose) in the blood.  Counting how many carbohydrates you eat improves your blood glucose control, which helps you manage your diabetes.  A dietitian can help you make a meal plan and calculate how many carbohydrates you should have at each meal and snack. This information is not intended to replace advice given to you by your health care provider. Make sure you discuss any questions you have with your health care provider. Document Revised: 10/19/2019 Document Reviewed: 10/20/2019 Elsevier Patient Education  2021 [employer] Inc.  

## 2020-12-18 NOTE — Progress Notes (Signed)
Established Patient Office Visit  Subjective:  Patient ID: Luke [name], male    DOB: November 11, 1980  Age: 40 y.o. MRN: [id number]  CC: No chief complaint on file.   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 His last HgbA1c done 4 months ago was 7.1% and he checks his blood glucose every 2 weeks and per patient it has being very high. He's non compliant with his appointments, and was out of his gabapentin and Pravastatin. During visit , his HgbA1c was 9.9% and finger stick was 580 mg/dl. He admits to experiencing polyuria, polyphagia, polydipsia and worsening peripheral neuropathy. He denies chest pain, palpitation, hematuria, and dizziness. Overall, he states that he's doing well and offers no further complaint.  Past Medical History:  Diagnosis Date  . Anemia   . Cancer ([place])    colon ca  . Cecum mass   . Chronic kidney disease   . Colonic neoplasm   . Diabetes mellitus without complication ([place])   . Diverticulosis of large intestine without diverticulitis   . GERD (gastroesophageal reflux disease)   . Hypertension   . Pneumonia 09/07/2016  . Stomach irritation     Past Surgical History:  Procedure Laterality Date  . COLON SURGERY    . COLONOSCOPY WITH PROPOFOL N/A 07/28/2018   Procedure: COLONOSCOPY WITH PROPOFOL;  Surgeon: Virgel Manifold, MD;  Location: [facility] END[facility];  Service: Endoscopy;  Laterality: N/A;  . COLONOSCOPY WITH PROPOFOL N/A 10/18/2019   Procedure: COLONOSCOPY WITH PROPOFOL;  Surgeon: Virgel Manifold, MD;  Location: [facility] END[facility]COPY;  Service: Endoscopy;  Laterality: N/A;  . ESOPHAGOGASTRODUODENOSCOPY (EGD) WITH PROPOFOL N/A 07/28/2018   Procedure: ESOPHAGOGASTRODUODENOSCOPY (EGD) WITH PROPOFOL;  Surgeon: Virgel Manifold, MD;  Location: [facility] ENDOSCOPY;  Service: Endoscopy;  Laterality: N/A;  . none    . PARTIAL COLECTOMY Right 08/15/2018   Procedure: RIGHT  COLECTOMY;  Surgeon: Vickie Epley, MD;  Location: [facility] ORS;  Service: General;  Laterality: Right;    Family History  Problem Relation Age of Onset  . CAD Maternal Grandmother   . Breast cancer Maternal Grandmother   . Breast cancer Mother   . Hypertension Mother   . Hypertension Father   . Colon cancer Paternal Uncle     Social History   Socioeconomic History  . Marital status: Single    Spouse name: Not on file  . Number of children: Not on file  . Years of education: Not on file  . Highest education level: Not on file  Occupational History  . Not on file  Tobacco Use  . Smoking status: Current Some Day Smoker    Packs/day: 0.10    Types: Cigarettes  . Smokeless tobacco: Never Used  . Tobacco comment: patient is social smoker/ smokes when he drinks   Vaping Use  . Vaping Use: Never used  Substance and Sexual Activity  . Alcohol use: Yes    Alcohol/week: 1.0 standard drink    Types: 1 Cans of beer per week    Comment: patient reports he consumes ~2 beers about once per month  . Drug use: Not Currently    Types: Marijuana, Cocaine  . Sexual activity: Yes    Birth control/protection: Condom  Other Topics Concern  . Not on file  Social History Narrative  . Not on file   Social Determinants of Health   Financial Resource Strain: Not on file  Food Insecurity: Not on file  Transportation  Needs: Not on file  Physical Activity: Not on file  Stress: Not on file  Social Connections: Not on file  Intimate Partner Violence: Not on file    Outpatient Medications Prior to Visit  Medication Sig Dispense Refill  . blood glucose meter kit and supplies KIT Dispense based on patient and insurance preference. Use up to four times daily as directed. (FOR ICD-9 250.00, 250.01). 1 each 0  . amLODipine (NORVASC) 5 MG tablet Take 1 tablet (5 mg total) by mouth daily. 90 tablet 1  . bisacodyl (DULCOLAX) 5 MG EC tablet Take all 4 tablets at 8 am the morning prior to your surgery. (Patient not taking: Reported  on 12/18/2020) 4 tablet 0  . gabapentin (NEURONTIN) 100 MG capsule Take 1 capsule by mouth at bedtime (Patient not taking: Reported on 12/18/2020) 30 capsule 0  . metFORMIN (GLUCOPHAGE) 850 MG tablet Take 1 tablet (850 mg total) by mouth 2 (two) times daily with a meal. 60 tablet 2  . polyethylene glycol (MIRALAX) 17 g packet Take 17 g by mouth daily. (Patient not taking: Reported on 12/18/2020) 14 each 0  . pravastatin (PRAVACHOL) 10 MG tablet Take 1 tablet (10 mg total) by mouth daily. (Patient not taking: Reported on 12/18/2020) 30 tablet 0   No facility-administered medications prior to visit.    No Known Allergies  ROS Review of Systems  Constitutional: Negative.   Eyes: Negative.   Respiratory: Negative.   Cardiovascular: Negative.   Endocrine: Positive for polydipsia, polyphagia and polyuria.  Genitourinary: Negative.   Skin: Negative.   Neurological: Positive for numbness.  Psychiatric/Behavioral: Negative.       Objective:    Physical Exam HENT:     Head: Normocephalic and atraumatic.  Eyes:     Extraocular Movements: Extraocular movements intact.     Conjunctiva/sclera: Conjunctivae normal.     Pupils: Pupils are equal, round, and reactive to light.  Cardiovascular:     Rate and Rhythm: Normal rate and regular rhythm.     Pulses: Normal pulses.     Heart sounds: Normal heart sounds.  Pulmonary:     Effort: Pulmonary effort is normal.     Breath sounds: Normal breath sounds.  Skin:    General: Skin is warm.  Neurological:     General: No focal deficit present.     Mental Status: He is alert and oriented to person, place, and time. Mental status is at baseline.  Psychiatric:        Mood and Affect: Mood normal.        Behavior: Behavior normal.        Thought Content: Thought content normal.        Judgment: Judgment normal.     BP 134/90 (BP Location: Left Arm, Patient Position: Sitting, Cuff Size: Large)   Pulse 87   Temp (!) 97 F (36.1 C)   Resp 16    Wt 198 lb 3.2 oz (89.9 kg)   SpO2 97%   BMI 30.14 kg/m  Wt Readings from Last 3 Encounters:  12/18/20 198 lb 3.2 oz (89.9 kg)  11/11/20 198 lb (89.8 kg)  10/11/20 198 lb (89.8 kg)  Weight loss encouraged  Health Maintenance Due  Topic Date Due  . Hepatitis C Screening  Never done  . PNEUMOCOCCAL POLYSACCHARIDE VACCINE AGE 59-64 HIGH RISK  Never done  . COVID-19 Vaccine (1) Never done  . OPHTHALMOLOGY EXAM  Never done  . URINE MICROALBUMIN  Never done  . TETANUS/TDAP  Never done  . INFLUENZA VACCINE  06/02/2020  . COLONOSCOPY (Pts 45-58yrs Insurance coverage will need to be confirmed)  10/17/2020    There are no preventive care reminders to display for this patient.  Lab Results  Component Value Date   TSH 0.891 02/28/2020   Lab Results  Component Value Date   WBC 5.5 09/13/2020   HGB 15.3 09/13/2020   HCT 46.2 09/13/2020   MCV 87.2 09/13/2020   PLT 181 09/13/2020   Lab Results  Component Value Date   NA 138 09/23/2020   K 4.1 09/23/2020   CO2 29 09/23/2020   GLUCOSE 114 (H) 09/23/2020   BUN 12 09/23/2020   CREATININE 1.20 11/07/2020   BILITOT 1.1 08/20/2020   ALKPHOS 63 08/20/2020   AST 16 08/20/2020   ALT 22 08/20/2020   PROT 6.8 08/20/2020   ALBUMIN 3.3 (L) 08/20/2020   CALCIUM 8.8 (L) 09/23/2020   ANIONGAP 2 (L) 09/23/2020   Lab Results  Component Value Date   CHOL 193 02/28/2020   Lab Results  Component Value Date   HDL 27 (L) 02/28/2020   Lab Results  Component Value Date   LDLCALC 141 (H) 02/28/2020   Lab Results  Component Value Date   TRIG 136 02/28/2020   Lab Results  Component Value Date   CHOLHDL 7.1 (H) 02/28/2020   Lab Results  Component Value Date   HGBA1C 9.9 (A) 12/18/2020   HGBA1C 9.9 12/18/2020   HGBA1C 0 (A) 12/18/2020   HGBA1C 0.0 12/18/2020      Assessment & Plan:     1. Essential hypertension - His blood pressure is under control and he will continue on current treatment regimen, and DASH diet. - Comp Met  (CMET); Future - amLODipine (NORVASC) 5 MG tablet; Take 1 tablet (5 mg total) by mouth daily.  Dispense: 90 tablet; Refill: 1  2. Type 2 diabetes mellitus with diabetic neuropathy, without long-term current use of insulin ([place]) - He Diabetes is not controlled, his HgbA1c was 9.9% and his goal should be less than 7%. He will start 10 units of Basaglar at bedtime, was educated on medication side effects and advised to notify clinic. He was advised to check blood glucose bid, his fasting reading goal should be between 80-130 mg/dl. He was advised to record his blood glucose and bring log to follow up appointment. He was advised to notify clinic if blood glucose is greater than 500 mg with 2 consecutive readings. - Comp Met (CMET); Future - HgB A1c; Future - Urine Microalbumin w/creat. ratio; Future - Ambulatory referral to Ophthalmology; Future - Urinalysis; Future - POCT Glucose (CBG); Future - POCT Glucose (CBG) - Insulin Glargine (BASAGLAR KWIKPEN) 100 UNIT/ML; Inject 10 Units into the skin daily.  Dispense: 3 mL; Refill: 1 - metFORMIN (GLUCOPHAGE) 850 MG tablet; Take 1 tablet (850 mg total) by mouth 2 (two) times daily with a meal.  Dispense: 60 tablet; Refill: 2 - gabapentin (NEURONTIN) 100 MG capsule; Take 1 capsule (100 mg total) by mouth at bedtime.  Dispense: 30 capsule; Refill: 0 - POCT HgB A1C; Future - POCT HgB A1C  3. Dyslipidemia (high LDL; low HDL) - He will continue on current medication , low fat/cholesterol diet and exercise as tolerated. - Lipid panel; Future - pravastatin (PRAVACHOL) 10 MG tablet; Take 1 tablet (10 mg total) by mouth daily.  Dispense: 30 tablet; Refill: 0     Follow-up: Return in about 29 days (around 01/16/2021), or if symptoms worsen or  fail to improve.    Angeliah Wisdom Jerold Coombe, NP

## 2020-12-19 ENCOUNTER — Ambulatory Visit: Payer: Self-pay | Admitting: Gerontology

## 2020-12-19 ENCOUNTER — Other Ambulatory Visit: Payer: Self-pay | Admitting: Gerontology

## 2020-12-19 DIAGNOSIS — E1140 Type 2 diabetes mellitus with diabetic neuropathy, unspecified: Secondary | ICD-10-CM

## 2020-12-19 DIAGNOSIS — E1169 Type 2 diabetes mellitus with other specified complication: Secondary | ICD-10-CM

## 2020-12-19 DIAGNOSIS — R899 Unspecified abnormal finding in specimens from other organs, systems and tissues: Secondary | ICD-10-CM | POA: Insufficient documentation

## 2020-12-19 DIAGNOSIS — E785 Hyperlipidemia, unspecified: Secondary | ICD-10-CM

## 2020-12-19 DIAGNOSIS — E119 Type 2 diabetes mellitus without complications: Secondary | ICD-10-CM

## 2020-12-19 LAB — COMPREHENSIVE METABOLIC PANEL
ALT: 25 IU/L (ref 0–44)
AST: 30 IU/L (ref 0–40)
Albumin/Globulin Ratio: 1.4 (ref 1.2–2.2)
Albumin: 4.3 g/dL (ref 4.0–5.0)
Alkaline Phosphatase: 118 IU/L (ref 44–121)
BUN/Creatinine Ratio: 9 (ref 9–20)
BUN: 13 mg/dL (ref 6–24)
Bilirubin Total: 0.2 mg/dL (ref 0.0–1.2)
CO2: 19 mmol/L — ABNORMAL LOW (ref 20–29)
Calcium: 9.2 mg/dL (ref 8.7–10.2)
Chloride: 87 mmol/L — ABNORMAL LOW (ref 96–106)
Creatinine, Ser: 1.4 mg/dL — ABNORMAL HIGH (ref 0.76–1.27)
GFR calc Af Amer: 72 mL/min/{1.73_m2} (ref >59)
GFR calc non Af Amer: 62 mL/min/{1.73_m2} (ref >59)
Globulin, Total: 3 g/dL (ref 1.5–4.5)
Glucose: 718 mg/dL (ref 65–99)
Potassium: 5.1 mmol/L (ref 3.5–5.2)
Sodium: 124 mmol/L — ABNORMAL LOW (ref 134–144)
Total Protein: 7.3 g/dL (ref 6.0–8.5)

## 2020-12-19 LAB — URINALYSIS
Bilirubin, UA: NEGATIVE
Ketones, UA: NEGATIVE
Leukocytes,UA: NEGATIVE
Nitrite, UA: NEGATIVE
Protein,UA: NEGATIVE
RBC, UA: NEGATIVE
Specific Gravity, UA: >=1.03 — AB (ref 1.005–1.030)
Urobilinogen, Ur: 0.2 mg/dL (ref 0.2–1.0)
pH, UA: 5.5 (ref 5.0–7.5)

## 2020-12-19 LAB — LIPID PANEL
Chol/HDL Ratio: 21.2 ratio — ABNORMAL HIGH (ref 0.0–5.0)
Cholesterol, Total: 276 mg/dL — ABNORMAL HIGH (ref 100–199)
HDL: 13 mg/dL — ABNORMAL LOW (ref >39)
Triglycerides: 2834 mg/dL (ref 0–149)

## 2020-12-19 LAB — GLUCOSE, POCT (MANUAL RESULT ENTRY): POC Glucose: 392 mg/dl — AB (ref 70–99)

## 2020-12-19 LAB — MICROALBUMIN / CREATININE URINE RATIO
Creatinine, Urine: 27 mg/dL
Microalb/Creat Ratio: <11 mg/g creat (ref 0–29)
Microalbumin, Urine: <3 ug/mL

## 2020-12-19 LAB — HEMOGLOBIN A1C
Est. average glucose Bld gHb Est-mCnc: 255 mg/dL
Hgb A1c MFr Bld: 10.5 % — ABNORMAL HIGH (ref 4.8–5.6)

## 2020-12-19 MED ORDER — PRAVASTATIN SODIUM 80 MG PO TABS: 80.0000 mg | ORAL_TABLET | Freq: Every day | ORAL | 2 refills | Status: DC

## 2020-12-19 MED ORDER — BASAGLAR KWIKPEN 100 UNIT/ML ~~LOC~~ SOPN: 20.0000 [IU] | PEN_INJECTOR | Freq: Every day | SUBCUTANEOUS | 3 refills | Status: DC

## 2020-12-19 MED ORDER — INSULIN LISPRO 100 UNIT/ML ~~LOC~~ SOLN
5.0000 [IU] | Freq: Three times a day (TID) | SUBCUTANEOUS | 5 refills | Status: DC
Start: 2020-12-19 — End: 2020-12-25

## 2020-12-19 MED ORDER — BASAGLAR KWIKPEN 100 UNIT/ML ~~LOC~~ SOPN
15.0000 [IU] | PEN_INJECTOR | Freq: Every day | SUBCUTANEOUS | 3 refills | Status: DC
Start: 2020-12-19 — End: 2020-12-19

## 2020-12-19 NOTE — Progress Notes (Signed)
Established Patient Office Visit  Subjective:  Patient ID: Luke [name], male    DOB: 03-Aug-1981  Age: 40 y.o. MRN: [id number]  CC: No chief complaint on file.   HPI Luke [name] presents for follow up of Type 2 diabetes with hyperglycemia and lab review. His HgbA1c was 10.5%, he was started on 10 units of Basaglar yesterday. He states that his fasting blood glucose today was 279 mg/dl. He admits to experiencing hyperglycemic symptoms. His Total cholesterol was 276 mg/dl, Triglycerides 2,834 mg/dl, HDL 13 mg/dl. He denies right upper quadrant abdominal pain. He admits to drinking alcoholic beverages daily. Also his Serum creatinine was 1.40 mg/dl, Sodium was 124 mmol/L. He promises to make dietary changes and offers no further complaint.  Past Medical History:  Diagnosis Date  . Anemia   . Cancer ([place])    colon ca  . Cecum mass   . Chronic kidney disease   . Colonic neoplasm   . Diabetes mellitus without complication ([place])   . Diverticulosis of large intestine without diverticulitis   . GERD (gastroesophageal reflux disease)   . Hypertension   . Pneumonia 09/07/2016  . Stomach irritation     Past Surgical History:  Procedure Laterality Date  . COLON SURGERY    . COLONOSCOPY WITH PROPOFOL N/A 07/28/2018   Procedure: COLONOSCOPY WITH PROPOFOL;  Surgeon: Virgel Manifold, MD;  Location: [facility];  Service: Endoscopy;  Laterality: N/A;  . COLONOSCOPY WITH PROPOFOL N/A 10/18/2019   Procedure: COLONOSCOPY WITH PROPOFOL;  Surgeon: Virgel Manifold, MD;  Location: [facility];  Service: Endoscopy;  Laterality: N/A;  . ESOPHAGOGASTRODUODENOSCOPY (EGD) WITH PROPOFOL N/A 07/28/2018   Procedure: ESOPHAGOGASTRODUODENOSCOPY (EGD) WITH PROPOFOL;  Surgeon: Virgel Manifold, MD;  Location: [facility];  Service: Endoscopy;  Laterality: N/A;  . none    . PARTIAL COLECTOMY Right 08/15/2018   Procedure: RIGHT  COLECTOMY;  Surgeon: Vickie Epley, MD;  Location: [facility]  ORS;  Service: General;  Laterality: Right;    Family History  Problem Relation Age of Onset  . CAD Maternal Grandmother   . Breast cancer Maternal Grandmother   . Breast cancer Mother   . Hypertension Mother   . Hypertension Father   . Colon cancer Paternal Uncle     Social History   Socioeconomic History  . Marital status: Single    Spouse name: Not on file  . Number of children: Not on file  . Years of education: Not on file  . Highest education level: Not on file  Occupational History  . Not on file  Tobacco Use  . Smoking status: Current Some Day Smoker    Packs/day: 0.10    Types: Cigarettes  . Smokeless tobacco: Never Used  . Tobacco comment: patient is social smoker/ smokes when he drinks   Vaping Use  . Vaping Use: Never used  Substance and Sexual Activity  . Alcohol use: Yes    Alcohol/week: 1.0 standard drink    Types: 1 Cans of beer per week    Comment: patient reports he consumes ~2 beers about once per month  . Drug use: Not Currently    Types: Marijuana, Cocaine  . Sexual activity: Yes    Birth control/protection: Condom  Other Topics Concern  . Not on file  Social History Narrative  . Not on file   Social Determinants of Health   Financial Resource Strain: Not on file  Food Insecurity: Not on file  Transportation Needs: Not on file  Physical Activity: Not on file  Stress: Not on file  Social Connections: Not on file  Intimate Partner Violence: Not on file    Outpatient Medications Prior to Visit  Medication Sig Dispense Refill  . amLODipine (NORVASC) 5 MG tablet Take 1 tablet (5 mg total) by mouth daily. 90 tablet 1  . blood glucose meter kit and supplies KIT Dispense based on patient and insurance preference. Use up to four times daily as directed. (FOR ICD-9 250.00, 250.01). 1 each 0  . gabapentin (NEURONTIN) 100 MG capsule Take 1 capsule (100 mg total) by mouth at bedtime. 30 capsule 0  . Insulin Glargine (BASAGLAR KWIKPEN) 100 UNIT/ML  Inject 20 Units into the skin at bedtime. 3 mL 3  . insulin lispro (HUMALOG) 100 UNIT/ML injection Inject 0.05 mLs (5 Units total) into the skin 3 (three) times daily before meals. 10 mL 5  . pravastatin (PRAVACHOL) 80 MG tablet Take 1 tablet (80 mg total) by mouth daily. 30 tablet 2   No facility-administered medications prior to visit.    No Known Allergies  ROS Review of Systems  Constitutional: Negative.   Eyes: Negative.   Respiratory: Negative.   Cardiovascular: Negative.   Endocrine: Positive for polydipsia, polyphagia and polyuria.  Skin: Negative.   Neurological: Negative.       Objective:    Physical Exam HENT:     Head: Normocephalic.  Eyes:     Extraocular Movements: Extraocular movements intact.     Conjunctiva/sclera: Conjunctivae normal.     Pupils: Pupils are equal, round, and reactive to light.  Cardiovascular:     Rate and Rhythm: Normal rate and regular rhythm.     Pulses: Normal pulses.     Heart sounds: Normal heart sounds.  Pulmonary:     Effort: Pulmonary effort is normal.     Breath sounds: Normal breath sounds.  Skin:    General: Skin is warm.  Neurological:     General: No focal deficit present.     Mental Status: He is alert and oriented to person, place, and time. Mental status is at baseline.     There were no vitals taken for this visit. Wt Readings from Last 3 Encounters:  12/18/20 198 lb 3.2 oz (89.9 kg)  11/11/20 198 lb (89.8 kg)  10/11/20 198 lb (89.8 kg)     Health Maintenance Due  Topic Date Due  . Hepatitis C Screening  Never done  . PNEUMOCOCCAL POLYSACCHARIDE VACCINE AGE 72-64 HIGH RISK  Never done  . COVID-19 Vaccine (1) Never done  . OPHTHALMOLOGY EXAM  Never done  . TETANUS/TDAP  Never done  . INFLUENZA VACCINE  06/02/2020  . COLONOSCOPY (Pts 45-19yr Insurance coverage will need to be confirmed)  10/17/2020    There are no preventive care reminders to display for this patient.  Lab Results  Component Value  Date   TSH 0.891 02/28/2020   Lab Results  Component Value Date   WBC 5.5 09/13/2020   HGB 15.3 09/13/2020   HCT 46.2 09/13/2020   MCV 87.2 09/13/2020   PLT 181 09/13/2020   Lab Results  Component Value Date   NA 124 (L) 12/18/2020   K 5.1 12/18/2020   CO2 19 (L) 12/18/2020   GLUCOSE 718 (HH) 12/18/2020   BUN 13 12/18/2020   CREATININE 1.40 (H) 12/18/2020   BILITOT 0.2 12/18/2020   ALKPHOS 118 12/18/2020   AST 30 12/18/2020   ALT 25 12/18/2020   PROT 7.3 12/18/2020  ALBUMIN 4.3 12/18/2020   CALCIUM 9.2 12/18/2020   ANIONGAP 2 (L) 09/23/2020   Lab Results  Component Value Date   CHOL 276 (H) 12/18/2020   Lab Results  Component Value Date   HDL 13 (L) 12/18/2020   Lab Results  Component Value Date   LDLCALC Comment (A) 12/18/2020   Lab Results  Component Value Date   TRIG 2,834 ([place]) 12/18/2020   Lab Results  Component Value Date   CHOLHDL 21.2 (H) 12/18/2020   Lab Results  Component Value Date   HGBA1C 9.9 (A) 12/18/2020   HGBA1C 9.9 12/18/2020   HGBA1C 0 (A) 12/18/2020   HGBA1C 0.0 12/18/2020      Assessment & Plan:    1. Type 2 diabetes mellitus with diabetic neuropathy, without long-term current use of insulin ([facility]) -His HgbA1c was 10.5%, his goal should be less than 7%. His Basaglar was increased to 20 units at bedtime, was started on Humalog 5 units tid. He was advised to check his blood glucose tid, record and bring his log at his appointment next week. He was educated about the complications of diabetes, to notify clinic or go to the ED with blood glucose greater than 500 mg/dl. He was provided with diabetes nutrition education. - POCT Glucose (CBG); Future - POCT Glucose (CBG)  2. Abnormal laboratory test result - His Sodium was 124 mmol/L and elevated Triglyceride, he was strongly advised on alcohol abstinence. He verbalized understanding.    Follow-up: Return in about 6 days (around 12/25/2020), or if symptoms worsen or fail to improve.     Jajuan Skoog Jerold Coombe, NP

## 2020-12-19 NOTE — Patient Instructions (Signed)

## 2020-12-25 ENCOUNTER — Ambulatory Visit: Payer: Self-pay | Admitting: Gerontology

## 2020-12-25 ENCOUNTER — Other Ambulatory Visit: Payer: Self-pay | Admitting: Gerontology

## 2020-12-25 ENCOUNTER — Other Ambulatory Visit: Payer: Self-pay

## 2020-12-25 VITALS — BP 114/81 | HR 80 | Wt 199.3 lb

## 2020-12-25 DIAGNOSIS — E1140 Type 2 diabetes mellitus with diabetic neuropathy, unspecified: Secondary | ICD-10-CM

## 2020-12-25 LAB — GLUCOSE, POCT (MANUAL RESULT ENTRY)
POC Glucose: 529 mg/dl — AB (ref 70–99)
POC Glucose: 579 mg/dl — AB (ref 70–99)

## 2020-12-25 MED ORDER — BASAGLAR KWIKPEN 100 UNIT/ML ~~LOC~~ SOPN
26.0000 [IU] | PEN_INJECTOR | Freq: Every day | SUBCUTANEOUS | 3 refills | Status: DC
Start: 2020-12-25 — End: 2020-12-25

## 2020-12-25 MED ORDER — INSULIN LISPRO 100 UNIT/ML ~~LOC~~ SOLN: 10.0000 [IU] | Freq: Once | SUBCUTANEOUS | Status: DC

## 2020-12-25 MED ORDER — INSULIN LISPRO 100 UNIT/ML ~~LOC~~ SOLN: 10.0000 [IU] | Freq: Three times a day (TID) | SUBCUTANEOUS | 5 refills | Status: DC

## 2020-12-25 NOTE — Patient Instructions (Signed)

## 2020-12-25 NOTE — Progress Notes (Signed)
Established Patient Office Visit  Subjective:  Patient ID: Luke [name], male    DOB: 02-26-1981  Age: 40 y.o. MRN: [id number]  CC: No chief complaint on file.   HPI Luke [name] presents for follow up of type 2 diabetes. His HgbA1c done on 12/18/20 was 10.5%. He brought his blood glucose log to clinic, his readings on 12/22/20, his fasting  Reading was 294 mg/dl, Pre Lunch was 270 mg/dl, and Pre Dinner was  399 mg/dl. On  12/24/20, his fasting reading was 286 mg/dl and didn't check pre lunch or dinner.  On 12/25/20 , his fasting reading was 349 mg/dl.  He presented to the clinic around 1400 pm, had lunch but didn't check his blood glucose. He states that he injected 5 units of his meal time insulin though he didn't check his blood glucose. His blood glucose  reading  registered high during visit. He was asked to bring the insulin he has in an ice pack container in his car. He administered 5 units of Basaglar with lunch instead of Humalog.   Past Medical History:  Diagnosis Date  . Anemia   . Cancer ([place])    colon ca  . Cecum mass   . Chronic kidney disease   . Colonic neoplasm   . Diabetes mellitus without complication ([place])   . Diverticulosis of large intestine without diverticulitis   . GERD (gastroesophageal reflux disease)   . Hypertension   . Pneumonia 09/07/2016  . Stomach irritation     Past Surgical History:  Procedure Laterality Date  . COLON SURGERY    . COLONOSCOPY WITH PROPOFOL N/A 07/28/2018   Procedure: COLONOSCOPY WITH PROPOFOL;  Surgeon: Virgel Manifold, MD;  Location: [facility] END[facility];  Service: Endoscopy;  Laterality: N/A;  . COLONOSCOPY WITH PROPOFOL N/A 10/18/2019   Procedure: COLONOSCOPY WITH PROPOFOL;  Surgeon: Virgel Manifold, MD;  Location: [facility] ENDOSCOPY;  Service: Endoscopy;  Laterality: N/A;  . ESOPHAGOGASTRODUODENOSCOPY (EGD) WITH PROPOFOL N/A 07/28/2018   Procedure: ESOPHAGOGASTRODUODENOSCOPY (EGD) WITH PROPOFOL;  Surgeon: Virgel Manifold, MD;  Location: [facility] ENDOSCOPY;  Service: Endoscopy;  Laterality: N/A;  . none    . PARTIAL COLECTOMY Right 08/15/2018   Procedure: RIGHT  COLECTOMY;  Surgeon: Vickie Epley, MD;  Location: [facility] ORS;  Service: General;  Laterality: Right;    Family History  Problem Relation Age of Onset  . CAD Maternal Grandmother   . Breast cancer Maternal Grandmother   . Breast cancer Mother   . Hypertension Mother   . Hypertension Father   . Colon cancer Paternal Uncle     Social History   Socioeconomic History  . Marital status: Single    Spouse name: Not on file  . Number of children: Not on file  . Years of education: Not on file  . Highest education level: Not on file  Occupational History  . Not on file  Tobacco Use  . Smoking status: Current Some Day Smoker    Packs/day: 0.10    Types: Cigarettes  . Smokeless tobacco: Never Used  . Tobacco comment: patient is social smoker/ smokes when he drinks   Vaping Use  . Vaping Use: Never used  Substance and Sexual Activity  . Alcohol use: Yes    Alcohol/week: 1.0 standard drink    Types: 1 Cans of beer per week    Comment: patient reports he consumes ~2 beers about once per month  . Drug use: Not Currently    Types: Marijuana, Cocaine  .  Sexual activity: Yes    Birth control/protection: Condom  Other Topics Concern  . Not on file  Social History Narrative  . Not on file   Social Determinants of Health   Financial Resource Strain: Not on file  Food Insecurity: Not on file  Transportation Needs: Not on file  Physical Activity: Not on file  Stress: Not on file  Social Connections: Not on file  Intimate Partner Violence: Not on file    Outpatient Medications Prior to Visit  Medication Sig Dispense Refill  . amLODipine (NORVASC) 5 MG tablet Take 1 tablet (5 mg total) by mouth daily. 90 tablet 1  . blood glucose meter kit and supplies KIT Dispense based on patient and insurance preference. Use up to four times daily as  directed. (FOR ICD-9 250.00, 250.01). 1 each 0  . gabapentin (NEURONTIN) 100 MG capsule Take 1 capsule (100 mg total) by mouth at bedtime. 30 capsule 0  . pravastatin (PRAVACHOL) 80 MG tablet Take 1 tablet (80 mg total) by mouth daily. 30 tablet 2  . Insulin Glargine (BASAGLAR KWIKPEN) 100 UNIT/ML Inject 20 Units into the skin at bedtime. 3 mL 3  . insulin lispro (HUMALOG) 100 UNIT/ML injection Inject 0.05 mLs (5 Units total) into the skin 3 (three) times daily before meals. 10 mL 5   No facility-administered medications prior to visit.    No Known Allergies  ROS Review of Systems  Constitutional: Negative.   Respiratory: Negative.   Cardiovascular: Negative.   Neurological: Negative.       Objective:    Physical Exam HENT:     Head: Normocephalic and atraumatic.  Eyes:     Extraocular Movements: Extraocular movements intact.     Conjunctiva/sclera: Conjunctivae normal.     Pupils: Pupils are equal, round, and reactive to light.  Cardiovascular:     Rate and Rhythm: Normal rate and regular rhythm.     Pulses: Normal pulses.     Heart sounds: Normal heart sounds.  Pulmonary:     Effort: Pulmonary effort is normal.     Breath sounds: Normal breath sounds.  Skin:    General: Skin is warm.  Neurological:     General: No focal deficit present.     Mental Status: He is alert and oriented to person, place, and time. Mental status is at baseline.  Psychiatric:        Mood and Affect: Mood normal.        Behavior: Behavior normal.        Thought Content: Thought content normal.        Judgment: Judgment normal.     BP 114/81 (BP Location: Left Arm, Patient Position: Sitting, Cuff Size: Large)   Pulse 80   Wt 199 lb 4.8 oz (90.4 kg)   BMI 30.30 kg/m  Wt Readings from Last 3 Encounters:  12/25/20 199 lb 4.8 oz (90.4 kg)  12/18/20 198 lb 3.2 oz (89.9 kg)  11/11/20 198 lb (89.8 kg)   Weight loss encouraged  Health Maintenance Due  Topic Date Due  . Hepatitis C  Screening  Never done  . PNEUMOCOCCAL POLYSACCHARIDE VACCINE AGE 32-64 HIGH RISK  Never done  . COVID-19 Vaccine (1) Never done  . OPHTHALMOLOGY EXAM  Never done  . TETANUS/TDAP  Never done  . INFLUENZA VACCINE  06/02/2020  . COLONOSCOPY (Pts 45-21yr Insurance coverage will need to be confirmed)  10/17/2020    There are no preventive care reminders to display for this patient.  Lab Results  Component Value Date   TSH 0.891 02/28/2020   Lab Results  Component Value Date   WBC 5.5 09/13/2020   HGB 15.3 09/13/2020   HCT 46.2 09/13/2020   MCV 87.2 09/13/2020   PLT 181 09/13/2020   Lab Results  Component Value Date   NA 124 (L) 12/18/2020   K 5.1 12/18/2020   CO2 19 (L) 12/18/2020   GLUCOSE 718 (HH) 12/18/2020   BUN 13 12/18/2020   CREATININE 1.40 (H) 12/18/2020   BILITOT 0.2 12/18/2020   ALKPHOS 118 12/18/2020   AST 30 12/18/2020   ALT 25 12/18/2020   PROT 7.3 12/18/2020   ALBUMIN 4.3 12/18/2020   CALCIUM 9.2 12/18/2020   ANIONGAP 2 (L) 09/23/2020   Lab Results  Component Value Date   CHOL 276 (H) 12/18/2020   Lab Results  Component Value Date   HDL 13 (L) 12/18/2020   Lab Results  Component Value Date   LDLCALC Comment (A) 12/18/2020   Lab Results  Component Value Date   TRIG 2,834 ([place]) 12/18/2020   Lab Results  Component Value Date   CHOLHDL 21.2 (H) 12/18/2020   Lab Results  Component Value Date   HGBA1C 9.9 (A) 12/18/2020   HGBA1C 9.9 12/18/2020   HGBA1C 0 (A) 12/18/2020   HGBA1C 0.0 12/18/2020      Assessment & Plan:   1. Type 2 diabetes mellitus with diabetic neuropathy, without long-term current use of insulin (HCC) - His HgbA1c was 10.5%, his goal should be less than 7%. He received 20 units of Basaglar and his blood glucose decreased to 579 at 14:58 pm. Also 10 units of Humalog was also administered at the clinic and his blood glucose decreased to 529 mg/dl. He was advised to check and record blood glucose, bring log to clinic tomorrow. He  should check blood glucose pre dinner and administer another 10 units of Humalog with dinner, and not to administer Basaglar. He verbalized understanding.  - POCT Glucose (CBG); Future - insulin lispro (HUMALOG) 100 UNIT/ML injection; Inject 0.1 mLs (10 Units total) into the skin 3 (three) times daily before meals.  Dispense: 10 mL; Refill: 5 - Insulin Glargine (BASAGLAR KWIKPEN) 100 UNIT/ML; Inject 26 Units into the skin at bedtime.  Dispense: 3 mL; Refill: 3 - POCT Glucose (CBG) - POCT Glucose (CBG); Future - POCT Glucose (CBG)       Follow-up: Return in about 1 day (around 12/26/2020), or if symptoms worsen or fail to improve.    Pj Zehner Jerold Coombe, NP

## 2020-12-26 ENCOUNTER — Ambulatory Visit: Payer: Self-pay | Admitting: Gerontology

## 2020-12-26 DIAGNOSIS — E1165 Type 2 diabetes mellitus with hyperglycemia: Secondary | ICD-10-CM

## 2020-12-26 NOTE — Progress Notes (Signed)
Established Patient Office Visit  Subjective:  Patient ID: Luke [name], male    DOB: May 01, 1981  Age: 40 y.o. MRN: [id number]  CC: DM f/u  HPI Luke [name] is a 40 year old male who presents for follow up of DM. He was seen yesterday and found to have FSBG reading "high". He reported that he had been administering his long acting insulin instead of his humalog with meals. He was given 5 units of Humalog which brought his BS down to 542m/dl. He was re-educated on proper insulin therapy, told to monitor his sugar at home, and his mealtime insulin was increased to 10 units. He returns today and reports his blood sugars since yesterday have been in the 300's. He is compliant with his current regimen. His FSBG in clinic today was 2586mdl. He correctly self administered 10 units of insulin. He reports dietary modifications as well, changing to sugar free/diet drinks, baked foods, and more salads. Overall, he feels well and has no other concerns.   Past Medical History:  Diagnosis Date  . Anemia   . Cancer ([place]   colon ca  . Cecum mass   . Chronic kidney disease   . Colonic neoplasm   . [place]  . Diverticulosis of large intestine without diverticulitis   . GERD (gastroesophageal reflux disease)   . Hypertension   . Pneumonia 09/07/2016  . Stomach irritation     Past Surgical History:  Procedure Laterality Date  . COLON SURGERY    . COLONOSCOPY WITH PROPOFOL N/A 07/28/2018   Procedure: COLONOSCOPY WITH PROPOFOL;  Surgeon: TaVirgel ManifoldMD;  Location: [facility]COPY;  Service: Endoscopy;  Laterality: N/A;  . COLONOSCOPY WITH PROPOFOL N/A 10/18/2019   Procedure: COLONOSCOPY WITH PROPOFOL;  Surgeon: TaVirgel ManifoldMD;  Location: [facility]COPY;  Service: Endoscopy;  Laterality: N/A;  . ESOPHAGOGASTRODUODENOSCOPY (EGD) WITH PROPOFOL N/A 07/28/2018   Procedure: ESOPHAGOGASTRODUODENOSCOPY (EGD) WITH PROPOFOL;  Surgeon: TaVirgel ManifoldMD;  Location: [facility];  Service: Endoscopy;  Laterality: N/A;  . none    . PARTIAL COLECTOMY Right 08/15/2018   Procedure: RIGHT  COLECTOMY;  Surgeon: DaVickie EpleyMD;  Location: [facility] ORS;  Service: General;  Laterality: Right;    Family History  Problem Relation Age of Onset  . CAD Maternal Grandmother   . Breast cancer Maternal Grandmother   . Breast cancer Mother   . Hypertension Mother   . Hypertension Father   . Colon cancer Paternal Uncle     Social History   Socioeconomic History  . Marital status: Single    Spouse name: Not on file  . Number of children: Not on file  . Years of education: Not on file  . Highest education level: Not on file  Occupational History  . Not on file  Tobacco Use  . Smoking status: Current Some Day Smoker    Packs/day: 0.10    Types: Cigarettes  . Smokeless tobacco: Never Used  . Tobacco comment: patient is social smoker/ smokes when he drinks   Vaping Use  . Vaping Use: Never used  Substance and Sexual Activity  . Alcohol use: Yes    Alcohol/week: 1.0 standard drink    Types: 1 Cans of beer per week    Comment: patient reports he consumes ~2 beers about once per month  . Drug use: Not Currently    Types: Marijuana, Cocaine  . Sexual activity: Yes    Birth control/protection:  Condom  Other Topics Concern  . Not on file  Social History Narrative  . Not on file   Social Determinants of Health   Financial Resource Strain: Not on file  Food Insecurity: Not on file  Transportation Needs: Not on file  Physical Activity: Not on file  Stress: Not on file  Social Connections: Not on file  Intimate Partner Violence: Not on file    Outpatient Medications Prior to Visit  Medication Sig Dispense Refill  . amLODipine (NORVASC) 5 MG tablet Take 1 tablet (5 mg total) by mouth daily. 90 tablet 1  . blood glucose meter kit and supplies KIT Dispense based on patient and insurance preference. Use up to four times  daily as directed. (FOR ICD-9 250.00, 250.01). 1 each 0  . gabapentin (NEURONTIN) 100 MG capsule Take 1 capsule (100 mg total) by mouth at bedtime. 30 capsule 0  . Insulin Glargine (BASAGLAR KWIKPEN) 100 UNIT/ML Inject 26 Units into the skin at bedtime. 3 mL 3  . insulin lispro (HUMALOG) 100 UNIT/ML injection Inject 0.1 mLs (10 Units total) into the skin 3 (three) times daily before meals. 10 mL 5  . pravastatin (PRAVACHOL) 80 MG tablet Take 1 tablet (80 mg total) by mouth daily. 30 tablet 2   Facility-Administered Medications Prior to Visit  Medication Dose Route Frequency Provider Last Rate Last Admin  . insulin lispro (HUMALOG) injection 10 Units  10 Units Subcutaneous Once Iloabachie, Chioma E, NP        No Known Allergies  ROS Review of Systems  Constitutional: Negative.   Respiratory: Negative.   Cardiovascular: Negative.   Gastrointestinal: Negative.   Endocrine: Negative for polydipsia, polyphagia and polyuria.  Skin: Negative.   Neurological: Negative.   Psychiatric/Behavioral: Negative.       Objective:    Physical Exam Constitutional:      Appearance: Normal appearance.  Cardiovascular:     Rate and Rhythm: Normal rate and regular rhythm.     Pulses: Normal pulses.     Heart sounds: Normal heart sounds.  Pulmonary:     Effort: Pulmonary effort is normal.     Breath sounds: Normal breath sounds.  Abdominal:     Palpations: Abdomen is soft.  Skin:    General: Skin is warm and dry.  Neurological:     Mental Status: He is alert and oriented to person, place, and time.  Psychiatric:        Mood and Affect: Mood normal.        Behavior: Behavior normal.     There were no vitals taken for this visit. Wt Readings from Last 3 Encounters:  12/25/20 199 lb 4.8 oz (90.4 kg)  12/18/20 198 lb 3.2 oz (89.9 kg)  11/11/20 198 lb (89.8 kg)     Health Maintenance Due  Topic Date Due  . Hepatitis C Screening  Never done  . PNEUMOCOCCAL POLYSACCHARIDE VACCINE AGE  43-64 HIGH RISK  Never done  . COVID-19 Vaccine (1) Never done  . OPHTHALMOLOGY EXAM  Never done  . TETANUS/TDAP  Never done  . INFLUENZA VACCINE  06/02/2020  . COLONOSCOPY (Pts 45-60yr Insurance coverage will need to be confirmed)  10/17/2020    There are no preventive care reminders to display for this patient.  Lab Results  Component Value Date   TSH 0.891 02/28/2020   Lab Results  Component Value Date   WBC 5.5 09/13/2020   HGB 15.3 09/13/2020   HCT 46.2 09/13/2020   MCV 87.2  09/13/2020   PLT 181 09/13/2020   Lab Results  Component Value Date   NA 124 (L) 12/18/2020   K 5.1 12/18/2020   CO2 19 (L) 12/18/2020   GLUCOSE 718 (HH) 12/18/2020   BUN 13 12/18/2020   CREATININE 1.40 (H) 12/18/2020   BILITOT 0.2 12/18/2020   ALKPHOS 118 12/18/2020   AST 30 12/18/2020   ALT 25 12/18/2020   PROT 7.3 12/18/2020   ALBUMIN 4.3 12/18/2020   CALCIUM 9.2 12/18/2020   ANIONGAP 2 (L) 09/23/2020   Lab Results  Component Value Date   CHOL 276 (H) 12/18/2020   Lab Results  Component Value Date   HDL 13 (L) 12/18/2020   Lab Results  Component Value Date   LDLCALC Comment (A) 12/18/2020   Lab Results  Component Value Date   TRIG 2,834 ([place]) 12/18/2020   Lab Results  Component Value Date   CHOLHDL 21.2 (H) 12/18/2020   Lab Results  Component Value Date   HGBA1C 9.9 (A) 12/18/2020   HGBA1C 9.9 12/18/2020   HGBA1C 0 (A) 12/18/2020   HGBA1C 0.0 12/18/2020      Assessment & Plan:   1. Type 2 diabetes mellitus with hyperglycemia, with long-term current use of insulin (HCC) Pt compliant and understanding of current medication regimen.  His blood sugar after self-administered humalog decreased to 270m/dl. He was advised to continue checking his blood sugar TID before meals and keep a log to bring to his next appt. Notify if BS consistently <100 or >180. Fasting BS goal is between 90-130.  Monitor for s/s of hypo/hyperglycemia.  Discussed increasing water intake and  dietary modifications.   Follow-up: Return in 1 month (01/23/21), or if symptoms worsen or fail to improve.     KClayton Bibles RN, BSN, FNP-S

## 2020-12-26 NOTE — Patient Instructions (Signed)

## 2021-01-16 ENCOUNTER — Ambulatory Visit: Payer: Self-pay | Admitting: Gerontology

## 2021-01-28 ENCOUNTER — Ambulatory Visit: Payer: Self-pay | Admitting: Gerontology

## 2021-01-31 LAB — HM DIABETES EYE EXAM

## 2021-02-03 ENCOUNTER — Other Ambulatory Visit: Payer: Self-pay

## 2021-02-03 ENCOUNTER — Encounter: Payer: Self-pay | Admitting: Surgery

## 2021-02-03 ENCOUNTER — Ambulatory Visit (INDEPENDENT_AMBULATORY_CARE_PROVIDER_SITE_OTHER): Payer: Self-pay | Admitting: Surgery

## 2021-02-03 VITALS — BP 137/91 | HR 69 | Temp 98.1°F | Ht 67.0 in | Wt 198.0 lb

## 2021-02-03 DIAGNOSIS — K5732 Diverticulitis of large intestine without perforation or abscess without bleeding: Secondary | ICD-10-CM

## 2021-02-03 MED ORDER — PEG 3350-KCL-NA BICARB-NACL 420 G PO SOLR
ORAL | 0 refills | Status: DC
  Filled 2021-02-03: qty 4000, 1d supply, fill #0

## 2021-02-03 NOTE — Patient Instructions (Addendum)
Referral sent to [facility]. Someone from their office will contact you to schedule your appointment.    High-Fiber Eating Plan Fiber, also called dietary fiber, is a type of carbohydrate. It is found foods such as fruits, vegetables, whole grains, and beans. A high-fiber diet can have many health benefits. Your health care provider may recommend a high-fiber diet to help:  Prevent constipation. Fiber can make your bowel movements more regular.  Lower your cholesterol.  Relieve the following conditions: ? Inflammation of veins in the anus (hemorrhoids). ? Inflammation of specific areas of the digestive tract (uncomplicated diverticulosis). ? A problem of the large intestine, also called the colon, that sometimes causes pain and diarrhea (irritable bowel syndrome, or IBS).  Prevent overeating as part of a weight-loss plan.  Prevent heart disease, type 2 diabetes, and certain cancers. What are tips for following this plan? Reading food labels  Check the nutrition facts label on food products for the amount of dietary fiber. Choose foods that have 5 grams of fiber or more per serving.  The goals for recommended daily fiber intake include: ? Men (age 34 or younger): 34-38 g. ? Men (over age 78): 28-34 g. ? Women (age 52 or younger): 25-28 g. ? Women (over age 13): 22-25 g. Your daily fiber goal is _____________ g.   Shopping  Choose whole fruits and vegetables instead of processed forms, such as apple juice or applesauce.  Choose a wide variety of high-fiber foods such as avocados, lentils, oats, and kidney beans.  Read the nutrition facts label of the foods you choose. Be aware of foods with added fiber. These foods often have high sugar and sodium amounts per serving. Cooking  Use whole-grain flour for baking and cooking.  Cook with brown rice instead of white rice. Meal planning  Start the day with a breakfast that is high in fiber, such as a cereal that  contains 5 g of fiber or more per serving.  Eat breads and cereals that are made with whole-grain flour instead of refined flour or white flour.  Eat brown rice, bulgur wheat, or millet instead of white rice.  Use beans in place of meat in soups, salads, and pasta dishes.  Be sure that half of the grains you eat each day are whole grains. General information  You can get the recommended daily intake of dietary fiber by: ? Eating a variety of fruits, vegetables, grains, nuts, and beans. ? Taking a fiber supplement if you are not able to take in enough fiber in your diet. It is better to get fiber through food than from a supplement.  Gradually increase how much fiber you consume. If you increase your intake of dietary fiber too quickly, you may have bloating, cramping, or gas.  Drink plenty of water to help you digest fiber.  Choose high-fiber snacks, such as berries, raw vegetables, nuts, and popcorn. What foods should I eat? Fruits Berries. Pears. Apples. Oranges. Avocado. Prunes and raisins. Dried figs. Vegetables Sweet potatoes. Spinach. Kale. Artichokes. Cabbage. Broccoli. Cauliflower. Green peas. Carrots. Squash. Grains Whole-grain breads. Multigrain cereal. Oats and oatmeal. Brown rice. Barley. Bulgur wheat. [place]. Quinoa. Bran muffins. Popcorn. Rye wafer crackers. Meats and other proteins Navy beans, kidney beans, and pinto beans. Soybeans. Split peas. Lentils. Nuts and seeds. Dairy Fiber-fortified yogurt. Beverages Fiber-fortified soy milk. Fiber-fortified orange juice. Other foods Fiber bars. The items listed above may not be a complete list of recommended foods and beverages. Contact a dietitian for more information.  What foods should I avoid? Fruits Fruit juice. Cooked, strained fruit. Vegetables Fried potatoes. Canned vegetables. Well-cooked vegetables. Grains White bread. Pasta made with refined flour. White rice. Meats and other proteins Fatty cuts of meat.  Fried chicken or fried fish. Dairy Milk. Yogurt. Cream cheese. Sour cream. Fats and oils Butters. Beverages Soft drinks. Other foods Cakes and pastries. The items listed above may not be a complete list of foods and beverages to avoid. Talk with your dietitian about what choices are best for you. Summary  Fiber is a type of carbohydrate. It is found in foods such as fruits, vegetables, whole grains, and beans.  A high-fiber diet has many benefits. It can help to prevent constipation, lower blood cholesterol, aid weight loss, and reduce your risk of heart disease, diabetes, and certain cancers.  Increase your intake of fiber gradually. Increasing fiber too quickly may cause cramping, bloating, and gas. Drink plenty of water while you increase the amount of fiber you consume.  The best sources of fiber include whole fruits and vegetables, whole grains, nuts, seeds, and beans. This information is not intended to replace advice given to you by your health care provider. Make sure you discuss any questions you have with your health care provider. Document Revised: 02/22/2020 Document Reviewed: 02/22/2020 Elsevier Patient Education  2021 [employer].

## 2021-02-03 NOTE — Progress Notes (Signed)
Outpatient Surgical Follow Up  02/03/2021  Luke [name] is an 40 y.o. male.   Chief Complaint  Patient presents with  . Follow-up    Discuss surgery    HPI: 40 year old unknown to me with a history of recurrent diverticulitis.  He did have a history of contained perforation.  He has been having at least 3 more flareups since I last saw him. He has had at least 2-3 more flare ups since the last time I saw him. He did have a hx of Right colectomy 08/15/18. Pt was ready for elective sigmoid colectomy a while back but changed his mind. His diabetic is not controlled w Hb1AC > 10.5 He also developed hematuria that prompted CT and cystoscopy. No evidence of fistula or malignant lesions sees. I did personally reviewed CT 11/2019 no evidence of abscess or active diverticulitis. He was due for another colonoscopy due to poor prep His diet seem to aggravate GI issues and abdominal pain/  Past Medical History:  Diagnosis Date  . Anemia   . Cancer ([place])    colon ca  . Cecum mass   . Chronic kidney disease   . Colonic neoplasm   . Diabetes mellitus without complication ([place])   . Diverticulosis of large intestine without diverticulitis   . GERD (gastroesophageal reflux disease)   . Hypertension   . Pneumonia 09/07/2016  . Stomach irritation     Past Surgical History:  Procedure Laterality Date  . COLON SURGERY    . COLONOSCOPY WITH PROPOFOL N/A 07/28/2018   Procedure: COLONOSCOPY WITH PROPOFOL;  Surgeon: Virgel Manifold, MD;  Location: [facility] ENDOSCOPY;  Service: Endoscopy;  Laterality: N/A;  . COLONOSCOPY WITH PROPOFOL N/A 10/18/2019   Procedure: COLONOSCOPY WITH PROPOFOL;  Surgeon: Virgel Manifold, MD;  Location: [facility] ENDOSCOPY;  Service: Endoscopy;  Laterality: N/A;  . ESOPHAGOGASTRODUODENOSCOPY (EGD) WITH PROPOFOL N/A 07/28/2018   Procedure: ESOPHAGOGASTRODUODENOSCOPY (EGD) WITH PROPOFOL;  Surgeon: Virgel Manifold, MD;  Location: [facility] ENDOSCOPY;  Service: Endoscopy;   Laterality: N/A;  . none    . PARTIAL COLECTOMY Right 08/15/2018   Procedure: RIGHT  COLECTOMY;  Surgeon: Vickie Epley, MD;  Location: [facility] ORS;  Service: General;  Laterality: Right;    Family History  Problem Relation Age of Onset  . CAD Maternal Grandmother   . Breast cancer Maternal Grandmother   . Breast cancer Mother   . Hypertension Mother   . Hypertension Father   . Colon cancer Paternal Uncle     Social History:  reports that he has been smoking cigarettes. He has been smoking about 0.10 packs per day. He has never used smokeless tobacco. He reports current alcohol use of about 1.0 standard drink of alcohol per week. He reports previous drug use. Drugs: Marijuana and Cocaine.  Allergies: Not on File  Medications reviewed.    ROS Full ROS performed and is otherwise negative other than what is stated in HPI   BP (!) 137/91   Pulse 69   Temp 98.1 F (36.7 C) (Oral)   Ht 5\' 7"  (1.702 m)   Wt 198 lb (89.8 kg)   SpO2 98%   BMI 31.01 kg/m   Physical Exam Vitals and nursing note reviewed. Exam conducted with a chaperone present.  Constitutional:      Appearance: Normal appearance.  Eyes:     General: No scleral icterus.       Right eye: No discharge.        Left eye: No discharge.  Abdominal:     General: Abdomen is flat. There is no distension.     Palpations: Abdomen is soft. There is no mass.     Tenderness: There is no abdominal tenderness. There is no rebound.     Hernia: No hernia is present.     Comments: No peritonitis  Musculoskeletal:        General: No tenderness. Normal range of motion.     Cervical back: Normal range of motion and neck supple. No rigidity.  Skin:    General: Skin is warm and dry.     Capillary Refill: Capillary refill takes less than 2 seconds.  Neurological:     General: No focal deficit present.     Mental Status: He is alert and oriented to person, place, and time.  Psychiatric:        Mood and Affect: Mood normal.         Behavior: Behavior normal.        Thought Content: Thought content normal.        Judgment: Judgment normal.      Assessment/Plan:  Recurrent complicated diverticulitis.  Discussed with the patient once more time but given his recurrence of the occasions and his age I definitely recommend elective colectomy.  Now that he does have significant disease in the ascending colon likely benefit from extension into the descending colon as well.  Prior to performing a colectomy I would like to make sure that he does not have any other premalignant problems or merit further intervention.  I will make referral for GI for colonoscopy. ( d/w Dr. Bonna Gains in detail)  Discussed with patient in detail about my thought process.  He seems to be now more engaged in the process.  I will see him back after he completes a colonoscopy and then we will discuss future colectomy at that time   Greater than 50% of the 40 minutes  visit was spent in counseling/coordination of care   Caroleen Hamman, MD [facility]

## 2021-02-04 ENCOUNTER — Other Ambulatory Visit: Payer: Self-pay

## 2021-02-05 ENCOUNTER — Other Ambulatory Visit: Payer: Self-pay

## 2021-02-06 ENCOUNTER — Other Ambulatory Visit: Payer: Self-pay

## 2021-02-17 ENCOUNTER — Encounter: Payer: Self-pay | Admitting: Gastroenterology

## 2021-02-17 ENCOUNTER — Ambulatory Visit
Admission: RE | Admit: 2021-02-17 | Discharge: 2021-02-17 | Disposition: A | Discharge status: Home / Self Care | Payer: Self-pay | Attending: Gastroenterology | Admitting: Gastroenterology

## 2021-02-17 ENCOUNTER — Ambulatory Visit: Payer: Self-pay | Admitting: Anesthesiology

## 2021-02-17 ENCOUNTER — Encounter
Admission: RE | Disposition: A | Discharge status: Home / Self Care | Payer: Self-pay | Source: Home / Self Care | Attending: Gastroenterology

## 2021-02-17 DIAGNOSIS — K648 Other hemorrhoids: Secondary | ICD-10-CM | POA: Insufficient documentation

## 2021-02-17 DIAGNOSIS — Z09 Encounter for follow-up examination after completed treatment for conditions other than malignant neoplasm: Secondary | ICD-10-CM | POA: Insufficient documentation

## 2021-02-17 DIAGNOSIS — K5732 Diverticulitis of large intestine without perforation or abscess without bleeding: Secondary | ICD-10-CM

## 2021-02-17 DIAGNOSIS — Z7984 Long term (current) use of oral hypoglycemic drugs: Secondary | ICD-10-CM | POA: Insufficient documentation

## 2021-02-17 DIAGNOSIS — Z79899 Other long term (current) drug therapy: Secondary | ICD-10-CM | POA: Insufficient documentation

## 2021-02-17 DIAGNOSIS — Z85038 Personal history of other malignant neoplasm of large intestine: Secondary | ICD-10-CM | POA: Insufficient documentation

## 2021-02-17 DIAGNOSIS — Z794 Long term (current) use of insulin: Secondary | ICD-10-CM | POA: Insufficient documentation

## 2021-02-17 DIAGNOSIS — Z980 Intestinal bypass and anastomosis status: Secondary | ICD-10-CM | POA: Insufficient documentation

## 2021-02-17 DIAGNOSIS — Z8719 Personal history of other diseases of the digestive system: Secondary | ICD-10-CM | POA: Insufficient documentation

## 2021-02-17 DIAGNOSIS — Z9049 Acquired absence of other specified parts of digestive tract: Secondary | ICD-10-CM | POA: Insufficient documentation

## 2021-02-17 HISTORY — PX: COLONOSCOPY WITH PROPOFOL: SHX5780

## 2021-02-17 LAB — GLUCOSE, CAPILLARY: Glucose-Capillary: 151 mg/dL — ABNORMAL HIGH (ref 70–99)

## 2021-02-17 SURGERY — COLONOSCOPY WITH PROPOFOL
Anesthesia: General

## 2021-02-17 MED ORDER — SODIUM CHLORIDE 0.9 % IV SOLN: INTRAVENOUS | Status: DC

## 2021-02-17 MED ORDER — PROPOFOL 500 MG/50ML IV EMUL
INTRAVENOUS | Status: AC
  Filled 2021-02-17: qty 50

## 2021-02-17 MED ORDER — LIDOCAINE 2% (20 MG/ML) 5 ML SYRINGE
INTRAMUSCULAR | Status: DC | PRN
  Administered 2021-02-17: 50 mg via INTRAVENOUS

## 2021-02-17 MED ORDER — PROPOFOL 500 MG/50ML IV EMUL
INTRAVENOUS | Status: DC | PRN
  Administered 2021-02-17: 200 ug/kg/min via INTRAVENOUS

## 2021-02-17 MED ORDER — DEXMEDETOMIDINE (PRECEDEX) IN NS 20 MCG/5ML (4 MCG/ML) IV SYRINGE
PREFILLED_SYRINGE | INTRAVENOUS | Status: DC | PRN
  Administered 2021-02-17: 12 ug via INTRAVENOUS

## 2021-02-17 MED ORDER — DEXMEDETOMIDINE (PRECEDEX) IN NS 20 MCG/5ML (4 MCG/ML) IV SYRINGE
PREFILLED_SYRINGE | INTRAVENOUS | Status: AC
  Filled 2021-02-17: qty 5

## 2021-02-17 MED ORDER — PROPOFOL 10 MG/ML IV BOLUS
INTRAVENOUS | Status: DC | PRN
  Administered 2021-02-17: 100 mg via INTRAVENOUS

## 2021-02-17 NOTE — Transfer of Care (Signed)
Immediate Anesthesia Transfer of Care Note  Patient: Luke [name]  Procedure(s) Performed: COLONOSCOPY WITH PROPOFOL (N/A )  Patient Location: Endoscopy Unit  Anesthesia Type:General  Level of Consciousness: awake  Airway & Oxygen Therapy: Patient connected to nasal cannula oxygen  Post-op Assessment: Post -op Vital signs reviewed and stable  Post vital signs: Reviewed and stable  Last Vitals:  Vitals Value Taken Time  BP 114/84 02/17/21 1023  Temp 35.7 C 02/17/21 1020  Pulse 75 02/17/21 1027  Resp 26 02/17/21 1027  SpO2 93 % 02/17/21 1027  Vitals shown include unvalidated device data.  Last Pain:  Vitals:   02/17/21 1020  TempSrc: Temporal  PainSc: Asleep         Complications: No complications documented.

## 2021-02-17 NOTE — H&P (Signed)
Vonda Antigua, MD [address] [place], [place], [place], [address] [address], [place], [place], [place], [address] Phone: [phone]  Fax: [phone]  Primary Care Physician:  Pcp, No   Pre-Procedure History & Physical: HPI:  Luke [name] is a 40 y.o. male is here for a colonoscopy.   Past Medical History:  Diagnosis Date  . Anemia   . Cancer ([place])    colon ca  . Cecum mass   . Chronic kidney disease   . Colonic neoplasm   . Diabetes mellitus without complication ([place])   . Diverticulosis of large intestine without diverticulitis   . GERD (gastroesophageal reflux disease)   . Hypertension   . Pneumonia 09/07/2016  . Stomach irritation     Past Surgical History:  Procedure Laterality Date  . COLON SURGERY    . COLONOSCOPY WITH PROPOFOL N/A 07/28/2018   Procedure: COLONOSCOPY WITH PROPOFOL;  Surgeon: Virgel Manifold, MD;  Location: [facility];  Service: Endoscopy;  Laterality: N/A;  . COLONOSCOPY WITH PROPOFOL N/A 10/18/2019   Procedure: COLONOSCOPY WITH PROPOFOL;  Surgeon: Virgel Manifold, MD;  Location: [facility];  Service: Endoscopy;  Laterality: N/A;  . ESOPHAGOGASTRODUODENOSCOPY (EGD) WITH PROPOFOL N/A 07/28/2018   Procedure: ESOPHAGOGASTRODUODENOSCOPY (EGD) WITH PROPOFOL;  Surgeon: Virgel Manifold, MD;  Location: [facility];  Service: Endoscopy;  Laterality: N/A;  . none    . PARTIAL COLECTOMY Right 08/15/2018   Procedure: RIGHT  COLECTOMY;  Surgeon: Vickie Epley, MD;  Location: [facility] ORS;  Service: General;  Laterality: Right;    Prior to Admission medications   Medication Sig Start Date End Date Taking? Authorizing Provider  amLODipine (NORVASC) 5 MG tablet TAKE ONE TABLET BY MOUTH EVERY DAY 12/18/20 12/18/21 Yes Iloabachie, Chioma E, NP  blood glucose meter kit and supplies KIT Dispense based on patient and insurance preference. Use up to four times daily as directed. (FOR ICD-9 250.00, 250.01). 04/04/20  Yes Iloabachie,  Chioma E, NP  COMFORT EZ PEN NEEDLES 32G X 4 MM MISC [place] AS DIRECTED WITH BASAGLAR 12/18/20 12/18/21 Yes   gabapentin (NEURONTIN) 100 MG capsule TAKE ONE CAPSULE BY MOUTH AT BEDTIME 12/18/20 12/18/21 Yes Iloabachie, Chioma E, NP  Insulin Glargine (BASAGLAR KWIKPEN) 100 UNIT/ML INJECT 26 UNITS INTO THE SKIN AT BEDTIME 12/25/20 12/25/21 Yes Iloabachie, Chioma E, NP  insulin lispro (HUMALOG) 100 UNIT/ML injection Inject 0.1 mLs (10 Units total) into the skin 3 (three) times daily before meals. 12/25/20  Yes Iloabachie, Chioma E, NP  insulin lispro (HUMALOG) 100 UNIT/ML KwikPen INJECT 0.1 ML (10 UNITS TOTAL) INTO THE SKIN 3 TIMES A DAY BEFORE MEALS 12/25/20 12/25/21 Yes Iloabachie, Chioma E, NP  insulin lispro (HUMALOG) 100 UNIT/ML KwikPen INJECT 5 UNITS (.05 MLS) INTO THE SKIN 3 TIMES A DAY BEFORE MEALS 12/19/20 12/19/21 Yes Iloabachie, Chioma E, NP  polyethylene glycol-electrolytes (NULYTELY) 420 g solution Starting at 5:00 PM: Drink one 8 oz glass of mixture every 15 minutes until you finish half of the jug. Five hours prior to procedure, drink 8 oz glass of mixture every 15 minutes until it is all gone. Make sure you do not drink anything 4 hours prior to your procedure. 02/03/21  Yes Vonda Antigua B, MD  pravastatin (PRAVACHOL) 20 MG tablet TAKE 1/2 TABLET BY MOUTH EVERY DAY 12/18/20 03/01/21 Yes Iloabachie, Chioma E, NP  pravastatin (PRAVACHOL) 40 MG tablet TAKE TWO TABLETS BY MOUTH EVERY DAY 12/19/20 06/01/21  Iloabachie, Chioma E, NP  pravastatin (PRAVACHOL) 80 MG tablet Take 1  tablet (80 mg total) by mouth daily. 12/19/20   Iloabachie, Chioma E, NP  metFORMIN (GLUCOPHAGE) 850 MG tablet Take 1 tablet (850 mg total) by mouth 2 (two) times daily with a meal. 12/18/20 12/19/20  Iloabachie, Chioma E, NP    Allergies as of 02/04/2021  . (Not on File)    Family History  Problem Relation Age of Onset  . CAD Maternal Grandmother   . Breast cancer Maternal Grandmother   . Breast cancer Mother   . Hypertension  Mother   . Hypertension Father   . Colon cancer Paternal Uncle     Social History   Socioeconomic History  . Marital status: Single    Spouse name: Not on file  . Number of children: Not on file  . Years of education: Not on file  . Highest education level: Not on file  Occupational History  . Not on file  Tobacco Use  . Smoking status: Current Some Day Smoker    Packs/day: 0.10    Types: Cigarettes  . Smokeless tobacco: Never Used  . Tobacco comment: patient is social smoker/ smokes when he drinks   Vaping Use  . Vaping Use: Never used  Substance and Sexual Activity  . Alcohol use: Yes    Alcohol/week: 1.0 standard drink    Types: 1 Cans of beer per week    Comment: patient reports he consumes ~2 beers about once per month  . Drug use: Not Currently    Types: Marijuana, Cocaine    Comment: NO DRUGS IN 2 YEARS  . Sexual activity: Yes    Birth control/protection: Condom  Other Topics Concern  . Not on file  Social History Narrative  . Not on file   Social Determinants of Health   Financial Resource Strain: Not on file  Food Insecurity: Not on file  Transportation Needs: Not on file  Physical Activity: Not on file  Stress: Not on file  Social Connections: Not on file  Intimate Partner Violence: Not on file    Review of Systems: See HPI, otherwise negative ROS  Physical Exam: There were no vitals taken for this visit. General:   Alert,  pleasant and cooperative in NAD Head:  Normocephalic and atraumatic. Neck:  Supple; no masses or thyromegaly. Lungs:  Clear throughout to auscultation, normal respiratory effort.    Heart:  +S1, +S2, Regular rate and rhythm, No edema. Abdomen:  Soft, nontender and nondistended. Normal bowel sounds, without guarding, and without rebound.   Neurologic:  Alert and  oriented x4;  grossly normal neurologically.  Impression/Plan: Luke [name] is here for a colonoscopy to be performed for diverticulitis.  Risks, benefits,  limitations, and alternatives regarding  colonoscopy have been reviewed with the patient.  Questions have been answered.  All parties agreeable.   Virgel Manifold, MD  02/17/2021, 8:54 AM

## 2021-02-17 NOTE — Anesthesia Preprocedure Evaluation (Signed)
Anesthesia Evaluation  Patient identified by MRN, date of birth, ID band Patient awake    Reviewed: Allergy & Precautions, H&P , NPO status , Patient's Chart, lab work & pertinent test results, reviewed documented beta blocker date and time   History of Anesthesia Complications Negative for: history of anesthetic complications  Airway Mallampati: II   Neck ROM: full    Dental  (+) Poor Dentition, Dental Advidsory Given   Pulmonary neg shortness of breath, pneumonia, resolved, neg COPD, neg recent URI, former smoker,    Pulmonary exam normal        Cardiovascular Exercise Tolerance: Good hypertension, On Medications (-) angina(-) Past MI and (-) Cardiac Stents Normal cardiovascular exam(-) dysrhythmias (-) Valvular Problems/Murmurs Rhythm:regular Rate:Normal     Neuro/Psych negative neurological ROS  negative psych ROS   GI/Hepatic Neg liver ROS, GERD  Medicated,  Endo/Other  diabetes  Renal/GU CRFRenal disease  negative genitourinary   Musculoskeletal   Abdominal   Peds  Hematology  (+) Blood dyscrasia, anemia ,   Anesthesia Other Findings Past Medical History: No date: Anemia No date: Cancer ([place])     Comment:  colon ca No date: Cecum mass No date: Colonic neoplasm No date: Diverticulosis of large intestine without diverticulitis No date: GERD (gastroesophageal reflux disease) No date: Hypertension 09/07/2016: Pneumonia No date: Stomach irritation Past Surgical History: No date: COLON SURGERY 07/28/2018: COLONOSCOPY WITH PROPOFOL; N/A     Comment:  Procedure: COLONOSCOPY WITH PROPOFOL;  Surgeon:               Virgel Manifold, MD;  Location: [facility];                Service: Endoscopy;  Laterality: N/A; 07/28/2018: ESOPHAGOGASTRODUODENOSCOPY (EGD) WITH PROPOFOL; N/A     Comment:  Procedure: ESOPHAGOGASTRODUODENOSCOPY (EGD) WITH               PROPOFOL;  Surgeon: Virgel Manifold, MD;   Location:               [facility] END[facility];  Service: Endoscopy;  Laterality: N/A; No date: none 08/15/2018: PARTIAL COLECTOMY; Right     Comment:  Procedure: RIGHT  COLECTOMY;  Surgeon: Vickie Epley, MD;  Location: [facility] ORS;  Service: General;                Laterality: Right;   Reproductive/Obstetrics negative OB ROS                             Anesthesia Physical  Anesthesia Plan  ASA: II  Anesthesia Plan: General   Post-op Pain Management:    Induction: Intravenous  PONV Risk Score and Plan: 2 and TIVA and Propofol infusion  Airway Management Planned: Natural Airway and Nasal Cannula  Additional Equipment:   Intra-op Plan:   Post-operative Plan:   Informed Consent: I have reviewed the patients History and Physical, chart, labs and discussed the procedure including the risks, benefits and alternatives for the proposed anesthesia with the patient or authorized representative who has indicated his/her understanding and acceptance.     Dental Advisory Given  Plan Discussed with: CRNA  Anesthesia Plan Comments:         Anesthesia Quick Evaluation

## 2021-02-17 NOTE — Op Note (Signed)
[facility] Patient Name: Kadrian Partch Procedure Date: 02/17/2021 9:36 AM MRN: [id number] Account #: [id number] Date of Birth: 09-06-1981 Admit Type: Outpatient Age: 40 Room: [facility] ENDO ROOM 2 Gender: Male Note Status: Finalized Procedure:             Colonoscopy Indications:           Follow-up of diverticulitis Providers:             Ardell Makarewicz B. Bonna Gains MD, MD Referring MD:          Forest Gleason Md, MD (Referring MD) Medicines:             Monitored Anesthesia Care Complications:         No immediate complications. Procedure:             Pre-Anesthesia Assessment:                        - Prior to the procedure, a History and Physical was                         performed, and patient medications, allergies and                         sensitivities were reviewed. The patient's tolerance                         of previous anesthesia was reviewed.                        - The risks and benefits of the procedure and the                         sedation options and risks were discussed with the                         patient. All questions were answered and informed                         consent was obtained.                        - Patient identification and proposed procedure were                         verified prior to the procedure by the physician, the                         nurse, the anesthetist and the technician. The                         procedure was verified in the pre-procedure area in                         the procedure room in the endoscopy suite.                        - ASA Grade Assessment: II - A patient with mild  systemic disease.                        - After reviewing the risks and benefits, the patient                         was deemed in satisfactory condition to undergo the                         procedure.                        After obtaining informed consent, the colonoscope was                          passed under direct vision. Throughout the procedure,                         the patient's blood pressure, pulse, and oxygen                         saturations were monitored continuously. The                         Colonoscope was introduced through the anus and                         advanced to the the ileocolonic anastomosis. The                         colonoscopy was performed with ease. The patient                         tolerated the procedure well. The quality of the bowel                         preparation was fair. Findings:      The perianal and digital rectal examinations were normal.      There was evidence of a prior surgical anastomosis in the ascending       colon. This was patent and was characterized by healthy appearing mucosa.      The exam was otherwise normal throughout the examined colon.      Non-bleeding internal hemorrhoids were found during retroflexion.      No additional abnormalities were found on retroflexion. Impression:            - Preparation of the colon was fair.                        - Patent surgical anastomosis, characterized by                         healthy appearing mucosa.                        - Non-bleeding internal hemorrhoids.                        - No specimens collected. Recommendation:        - Discharge patient to home.                        -  Resume previous diet.                        - Continue present medications.                        - Repeat colonoscopy in 1-2 years, with 2 day prep,                         because the bowel preparation was suboptimal.                        - Return to primary care physician as previously                         scheduled.                        - The findings and recommendations were discussed with                         the patient.                        - The findings and recommendations were discussed with                         the patient's  family. Procedure Code(s):     --- Professional ---                        [phone], Colonoscopy, flexible; diagnostic, including                         collection of specimen(s) by brushing or washing, when                         performed (separate procedure) Diagnosis Code(s):     --- Professional ---                        Z98.0, Intestinal bypass and anastomosis status                        K57.32, Diverticulitis of large intestine without                         perforation or abscess without bleeding CPT copyright 2019 American [employer] Association. All rights reserved. The codes documented in this report are preliminary and upon coder review may  be revised to meet current compliance requirements.  Vonda Antigua, MD Margretta Sidle B. Bonna Gains MD, MD 02/17/2021 10:21:23 AM This report has been signed electronically. Number of Addenda: 0 Note Initiated On: 02/17/2021 9:36 AM Scope Withdrawal Time: 0 hours 13 minutes 59 seconds  Total Procedure Duration: 0 hours 22 minutes 48 seconds       [facility]

## 2021-02-17 NOTE — Anesthesia Postprocedure Evaluation (Signed)
Anesthesia Post Note  Patient: Luke [name]  Procedure(s) Performed: COLONOSCOPY WITH PROPOFOL (N/A )  Patient location during evaluation: Endoscopy Anesthesia Type: General Level of consciousness: awake and alert Pain management: pain level controlled Vital Signs Assessment: post-procedure vital signs reviewed and stable Respiratory status: spontaneous breathing, nonlabored ventilation, respiratory function stable and patient connected to nasal cannula oxygen Cardiovascular status: blood pressure returned to baseline and stable Postop Assessment: no apparent nausea or vomiting Anesthetic complications: no   No complications documented.   Last Vitals:  Vitals:   02/17/21 1030 02/17/21 1040  BP: 114/88 (!) 127/94  Pulse: 68 68  Resp: (!) 25 (!) 26  Temp:    SpO2: 94% 94%    Last Pain:  Vitals:   02/17/21 1040  TempSrc:   PainSc: 0-No pain                 Martha Clan

## 2021-02-18 ENCOUNTER — Ambulatory Visit: Payer: Self-pay | Admitting: Gerontology

## 2021-02-18 ENCOUNTER — Encounter: Payer: Self-pay | Admitting: Gastroenterology

## 2021-02-18 ENCOUNTER — Other Ambulatory Visit: Payer: Self-pay

## 2021-02-18 ENCOUNTER — Other Ambulatory Visit: Payer: Self-pay | Admitting: Gerontology

## 2021-02-18 VITALS — BP 116/80 | HR 73 | Temp 97.2°F | Resp 16 | Wt 200.4 lb

## 2021-02-18 DIAGNOSIS — E1140 Type 2 diabetes mellitus with diabetic neuropathy, unspecified: Secondary | ICD-10-CM

## 2021-02-18 DIAGNOSIS — D509 Iron deficiency anemia, unspecified: Secondary | ICD-10-CM

## 2021-02-18 DIAGNOSIS — R0683 Snoring: Secondary | ICD-10-CM

## 2021-02-18 DIAGNOSIS — E1169 Type 2 diabetes mellitus with other specified complication: Secondary | ICD-10-CM

## 2021-02-18 LAB — GLUCOSE, POCT (MANUAL RESULT ENTRY): POC Glucose: 147 mg/dl — AB (ref 70–99)

## 2021-02-18 MED ORDER — GABAPENTIN 100 MG PO CAPS
ORAL_CAPSULE | Freq: Every day | ORAL | 2 refills | Status: DC
  Filled 2021-02-18: qty 30, 30d supply, fill #0

## 2021-02-18 NOTE — Patient Instructions (Signed)
Sleep Study, Adult A sleep study (polysomnogram) is a series of tests done while you are sleeping. A sleep study records your brain waves, heart rate, breathing rate, oxygen level, and eye and leg movements. A sleep study helps your health care provider:  See how well you sleep.  Diagnose a sleep disorder.  Determine how severe your sleep disorder is.  Create a plan to treat your sleep disorder. Your health care provider may recommend a sleep study if you:  Feel sleepy on most days.  Snore loudly while sleeping.  Have unusual behaviors while you sleep, such as walking.  Have brief periods in which you stop breathing during sleep (sleepapnea).  Fall asleep suddenly during the day (narcolepsy).  Have trouble falling asleep or staying asleep (insomnia).  Feel like you need to move your legs when trying to fall asleep (restless legs syndrome).  Move your legs by flexing and extending them regularly while asleep (periodic limb movement disorder).  Act out your dreams while you sleep (sleep behavior disorder).  Feel like you cannot move when you first wake up (sleep paralysis). What tests are part of a sleep study? Most sleep studies record the following during sleep:  Brain activity.  Eye movements.  Heart rate and rhythm.  Breathing rate and rhythm.  Blood-oxygen level.  Blood pressure.  Chest and belly movement as you breathe.  Arm and leg movements.  Snoring or other noises.  Body position. Where are sleep studies done? Sleep studies are done at sleep centers. A sleep center may be inside a hospital, office, or clinic. The room where you have the study may look like a hospital room or a hotel room. The health care providers doing the study may come in and out of the room during the study. Most of the time, they will be in another room monitoring your test as you sleep. How are sleep studies done? Most sleep studies are done during a normal period of time for a  full night of sleep. You will arrive at the study center in the evening and go home in the morning. Before the test  Bring your pajamas and toothbrush with you to the sleep study.  Do not have caffeine on the day of your sleep study.  Do not drink alcohol on the day of your sleep study.  Your health care provider will let you know if you should stop taking any of your regular medicines before the test. During the test  Round, sticky patches with sensors attached to recording wires (electrodes) are placed on your scalp, face, chest, and limbs.  Wires from all the electrodes and sensors run from your bed to a computer. The wires can be taken off and put back on if you need to get out of bed to go to the bathroom.  A sensor is placed over your nose to measure airflow.  A finger clip is put on your finger or ear to measure your blood oxygen level (pulse oximetry).  A belt is placed around your belly and a belt is placed around your chest to measure breathing movements.  If you have signs of the sleep disorder called sleep apnea during your test, you may get a treatment mask to wear for the second half of the night. ? The mask provides positive airway pressure (PAP) to help you breathe better during sleep. This may greatly improve your sleep apnea. ? You will then have all tests done again with the mask in place to  see if your measurements and recordings change.      After the test  A medical doctor who specializes in sleep will evaluate the results of your sleep study and share them with you and your primary health care provider.  Based on your results, your medical history, and a physical exam, you may be diagnosed with a sleep disorder, such as: ? Sleep apnea. ? Restless legs syndrome. ? Sleep-related behavior disorder. ? Sleep-related movement disorders. ? Sleep-related seizure disorders.  Your health care team will help determine your treatment options based on your diagnosis.  This may include: ? Improving your sleep habits (sleep hygiene). ? Wearing a continuous positive airway pressure (CPAP) or bi-level positive airway pressure (BPAP) mask. ? Wearing an oral device at night to improve breathing and reduce snoring. ? Taking medicines. Follow these instructions at home:  Take over-the-counter and prescription medicines only as told by your health care provider.  If you are instructed to use a CPAP or BPAP mask, make sure you use it nightly as directed.  Make any lifestyle changes that your health care provider recommends.  If you were given a device to open your airway while you sleep, use it only as told by your health care provider.  Do not use any tobacco products, such as cigarettes, chewing tobacco, and e-cigarettes. If you need help quitting, ask your health care provider.  Keep all follow-up visits as told by your health care provider. This is important. Summary  A sleep study (polysomnogram) is a series of tests done while you are sleeping. It shows how well you sleep.  Most sleep studies are done over one full night of sleep. You will arrive at the study center in the evening and go home in the morning.  If you have signs of the sleep disorder called sleep apnea during your test, you may get a treatment mask to wear for the second half of the night.  A medical doctor who specializes in sleep will evaluate the results of your sleep study and share them with your primary health care provider. This information is not intended to replace advice given to you by your health care provider. Make sure you discuss any questions you have with your health care provider. Document Revised: 11/24/2019 Document Reviewed: 11/16/2017 Elsevier Patient Education  2021 [employer]. Hypoglycemia Hypoglycemia is when the sugar (glucose) level in your blood is too low. Low blood sugar can happen to people who have diabetes and people who do not have diabetes. Low blood  sugar can happen quickly, and it can be an emergency. What are the causes? This condition happens most often in people who have diabetes and may be caused by:  Diabetes medicine.  Not eating enough, or not eating often enough.  Doing more physical activity.  Drinking alcohol on an empty stomach. If you do not have diabetes, hypoglycemia may be caused by:  A tumor in the pancreas.  Not eating enough, or not eating for long periods at a time (fasting).  A very bad infection or illness.  Problems after having weight loss (bariatric) surgery.  Kidney failure or liver failure.  Certain medicines. What increases the risk? This condition is more likely to develop in people who:  Have diabetes and take medicines to lower their blood sugar.  Abuse alcohol.  Have a very bad illness. What are the signs or symptoms? Symptoms depend on whether your low blood sugar is mild, moderate, or very low. Mild  Hunger.  Feeling worried or nervous (anxious).  Sweating and feeling clammy.  Feeling dizzy or light-headed.  Being sleepy or having trouble sleeping.  Feeling like you may vomit (nauseous).  A fast heartbeat.  A headache.  Blurry vision.  Being irritable or grouchy.  Tingling or loss of feeling (numbness) around your mouth, lips, or tongue.  Trouble with moving (coordination). Moderate  Confusion and poor judgment.  Behavior changes.  Weakness.  Uneven heartbeats. Very low Very low blood sugar (severe hypoglycemia) is a medical emergency. It can cause:  Fainting.  Jerky movements that you cannot control (seizure).  Loss of consciousness (coma).  Death. How is this treated? Treating low blood sugar Low blood sugar is often treated by eating or drinking something sugary right away. The snack should contain 15 grams of a fast-acting carb (carbohydrate). Options include:  4 oz (120 mL) of fruit juice.  4-6 oz (120-150 mL) of regular soda (not diet  soda).  8 oz (240 mL) of low-fat milk.  Several pieces of hard candy. Check food labels to find out how many to eat for 15 grams.  1 Tbsp (15 mL) of sugar or honey. Treating low blood sugar if you have diabetes If you can think clearly and swallow safely, follow the 15:15 rule:  Take 15 grams of a fast-acting carb. Talk with your doctor about how much you should take.  Always keep a source of fast-acting carb with you, such as: ? Sugar tablets (glucose pills). Take 4 pills. ? Several pieces of hard candy. Check food labels to see how many pieces to eat for 15 grams. ? 4 oz (120 mL) of fruit juice. ? 4-6 oz (120-150 mL) of regular (not diet) soda. ? 1 Tbsp (15 mL) of honey or sugar.  Check your blood sugar 15 minutes after you take the carb.  If your blood sugar is still at or below 70 mg/dL (3.9 mmol/L), take 15 grams of a carb again.  If your blood sugar does not go above 70 mg/dL (3.9 mmol/L) after 3 tries, get help right away.  After your blood sugar goes back to normal, eat a meal or a snack within 1 hour.   Treating very low blood sugar If your blood sugar is at or below 54 mg/dL (3 mmol/L), you have very low blood sugar, or severe hypoglycemia. This is an emergency. Get medical help right away. If you have very low blood sugar and you cannot eat or drink, you will need to be given a hormone called glucagon. A family member or friend should learn how to check your blood sugar and how to give you glucagon. Ask your doctor if you need to have an emergency glucagon kit at home. Very low blood sugar may also need to be treated in a hospital. Follow these instructions at home: General instructions  Take over-the-counter and prescription medicines only as told by your doctor.  Stay aware of your blood sugar as told by your doctor.  If you drink alcohol: ? Limit how much you use to:  0-1 drink a day for nonpregnant women.  0-2 drinks a day for men. ? Be aware of how much  alcohol is in your drink. In the [place] one drink equals one 12 oz bottle of beer (355 mL), one 5 oz glass of wine (148 mL), or one 1 oz glass of hard liquor (44 mL).  Keep all follow-up visits as told by your doctor. This is important. If you have diabetes:  Always have a rapid-acting carb (15 grams) option with you to treat low blood sugar.  Follow your diabetes care plan as told by your doctor. Make sure you: ? Know the symptoms of low blood sugar. ? Check your blood sugar as often as told by your doctor. Always check it before and after exercise. ? Always check your blood sugar before you drive. ? Take your medicines as told. ? Follow your meal plan. ? Eat on time. Do not skip meals.  Share your diabetes care plan with: ? Your work or school. ? People you live with.  Carry a card or wear jewelry that says you have diabetes.   Contact a doctor if:  You have trouble keeping your blood sugar in your target range.  You have low blood sugar often. Get help right away if:  You still have symptoms after you eat or drink something that contains 15 grams of fast-acting carb and you cannot get your blood sugar above 70 mg/dL by following the 15:15 rule.  Your blood sugar is at or below 54 mg/dL (3 mmol/L).  You have a seizure.  You faint. These symptoms may be an emergency. Do not wait to see if the symptoms will go away. Get medical help right away. Call your local emergency services (911 in the [place] Do not drive yourself to the hospital. Summary  Hypoglycemia happens when the level of sugar (glucose) in your blood is too low.  Low blood sugar can happen to people who have diabetes and people who do not have diabetes. Low blood sugar can happen quickly, and it can be an emergency.  Make sure you know the symptoms of low blood sugar and know how to treat it.  Always keep a source of sugar (fast-acting carb) with you to treat low blood sugar. This information is not intended to  replace advice given to you by your health care provider. Make sure you discuss any questions you have with your health care provider. Document Revised: 09/13/2019 Document Reviewed: 09/13/2019 Elsevier Patient Education  2021 [employer].

## 2021-02-18 NOTE — Progress Notes (Signed)
Established Patient Office Visit  Subjective:  Patient ID: Luke [name], male    DOB: [birth year]  Age: 40 y.o. MRN: [id number]  CC: F/u DM  HPI Luke [name] is a 40 year old male who presents for follow up of DM. He was started on insulin therapy in February due to a Hgb A1c of 10.5%. At a subsequent visit, he was noted to be injecting his long-acting insulin instead of his meal-time insulin. He was re-educated on his insulin therapies and at follow up, was found to be compliant with the appropriate regimen. Today, he reports continued compliance with his medication regimen and makes healthy lifestyle choices. He reports he has been checking his blood sugar every other day and his most recent home reading was 151 mg/dL. His FSBG today is 147 mg/dL. He does report occasional episodes of weakness, jitteriness, clamminess, and lightheadedness. He denies any syncopal events and states that these symptoms resolve with eating. He denies polyuria, polyphagia, polydipsia or vision changes. He performs daily self foot exams and his peripheral neuropathy is well-controlled. He continues to make appropriate dietary modifications, avoiding sugary drinks and monitoring his intake of carbs.   Earlier this month, he was seen by general surgeon Dr. Dahlia Byes for follow up regarding his diverticulitis. He has had multiple flares and was referred to GI for colonoscopy, which was completed 02/17/21. He was noted to have periods of apnea during and after the procedure. He does report a history of snoring and feeling fatigued although he sleeps all night. He denies any current abdominal pain or distention, nausea/vomitting, fever/chills, or hematochezia. He will follow up with Dr. Ronelle Nigh and Dr. Dahlia Byes as ordered. Overall, he feels well and offers no further complaints.  Past Medical History:  Diagnosis Date  . Anemia   . Cancer ([place])    colon ca  . Cecum mass   . Chronic kidney disease   . Colonic neoplasm   .  Diabetes mellitus without complication ([place])   . Diverticulosis of large intestine without diverticulitis   . GERD (gastroesophageal reflux disease)   . Hypertension   . Pneumonia 09/07/2016  . Stomach irritation     Past Surgical History:  Procedure Laterality Date  . COLON SURGERY    . COLONOSCOPY WITH PROPOFOL N/A 07/28/2018   Procedure: COLONOSCOPY WITH PROPOFOL;  Surgeon: Virgel Manifold, MD;  Location: [facility] ENDOSCOPY;  Service: Endoscopy;  Laterality: N/A;  . COLONOSCOPY WITH PROPOFOL N/A 10/18/2019   Procedure: COLONOSCOPY WITH PROPOFOL;  Surgeon: Virgel Manifold, MD;  Location: [facility] ENDOSCOPY;  Service: Endoscopy;  Laterality: N/A;  . COLONOSCOPY WITH PROPOFOL N/A 02/17/2021   Procedure: COLONOSCOPY WITH PROPOFOL;  Surgeon: Virgel Manifold, MD;  Location: [facility] ENDOSCOPY;  Service: Endoscopy;  Laterality: N/A;  . ESOPHAGOGASTRODUODENOSCOPY (EGD) WITH PROPOFOL N/A 07/28/2018   Procedure: ESOPHAGOGASTRODUODENOSCOPY (EGD) WITH PROPOFOL;  Surgeon: Virgel Manifold, MD;  Location: [facility] ENDOSCOPY;  Service: Endoscopy;  Laterality: N/A;  . none    . PARTIAL COLECTOMY Right 08/15/2018   Procedure: RIGHT  COLECTOMY;  Surgeon: Vickie Epley, MD;  Location: [facility] ORS;  Service: General;  Laterality: Right;    Family History  Problem Relation Age of Onset  . CAD Maternal Grandmother   . Breast cancer Maternal Grandmother   . Breast cancer Mother   . Hypertension Mother   . Hypertension Father   . Colon cancer Paternal Uncle     Social History   Socioeconomic History  . Marital status: Single  Spouse name: Not on file  . Number of children: Not on file  . Years of education: Not on file  . Highest education level: Not on file  Occupational History  . Not on file  Tobacco Use  . Smoking status: Current Some Day Smoker    Packs/day: 0.10    Types: Cigarettes  . Smokeless tobacco: Never Used  . Tobacco comment: patient is social smoker/ smokes when he  drinks   Vaping Use  . Vaping Use: Never used  Substance and Sexual Activity  . Alcohol use: Yes    Alcohol/week: 1.0 standard drink    Types: 1 Cans of beer per week    Comment: patient reports he consumes ~2 beers about once per month  . Drug use: Not Currently    Types: Marijuana, Cocaine    Comment: NO DRUGS IN 2 YEARS  . Sexual activity: Yes    Birth control/protection: Condom  Other Topics Concern  . Not on file  Social History Narrative  . Not on file   Social Determinants of Health   Financial Resource Strain: Not on file  Food Insecurity: Not on file  Transportation Needs: Not on file  Physical Activity: Not on file  Stress: Not on file  Social Connections: Not on file  Intimate Partner Violence: Not on file    Outpatient Medications Prior to Visit  Medication Sig Dispense Refill  . amLODipine (NORVASC) 5 MG tablet TAKE ONE TABLET BY MOUTH EVERY DAY 90 tablet 1  . blood glucose meter kit and supplies KIT Dispense based on patient and insurance preference. Use up to four times daily as directed. (FOR ICD-9 250.00, 250.01). 1 each 0  . COMFORT EZ PEN NEEDLES 32G X 4 MM MISC [place] AS DIRECTED WITH BASAGLAR 100 each 11  . gabapentin (NEURONTIN) 100 MG capsule TAKE ONE CAPSULE BY MOUTH AT BEDTIME 30 capsule 0  . Insulin Glargine (BASAGLAR KWIKPEN) 100 UNIT/ML INJECT 26 UNITS INTO THE SKIN AT BEDTIME 5 mL 3  . insulin lispro (HUMALOG) 100 UNIT/ML injection Inject 0.1 mLs (10 Units total) into the skin 3 (three) times daily before meals. 10 mL 5  . insulin lispro (HUMALOG) 100 UNIT/ML KwikPen INJECT 0.1 ML (10 UNITS TOTAL) INTO THE SKIN 3 TIMES A DAY BEFORE MEALS 15 mL 5  . insulin lispro (HUMALOG) 100 UNIT/ML KwikPen INJECT 5 UNITS (.05 MLS) INTO THE SKIN 3 TIMES A DAY BEFORE MEALS 15 mL 5  . polyethylene glycol-electrolytes (NULYTELY) 420 g solution Starting at 5:00 PM: Drink one 8 oz glass of mixture every 15 minutes until you finish half of the jug. Five hours prior to  procedure, drink 8 oz glass of mixture every 15 minutes until it is all gone. Make sure you do not drink anything 4 hours prior to your procedure. 4000 mL 0  . pravastatin (PRAVACHOL) 20 MG tablet TAKE 1/2 TABLET BY MOUTH EVERY DAY 15 tablet 0  . pravastatin (PRAVACHOL) 40 MG tablet TAKE TWO TABLETS BY MOUTH EVERY DAY 60 tablet 2  . pravastatin (PRAVACHOL) 80 MG tablet Take 1 tablet (80 mg total) by mouth daily. 30 tablet 2   Facility-Administered Medications Prior to Visit  Medication Dose Route Frequency Provider Last Rate Last Admin  . insulin lispro (HUMALOG) injection 10 Units  10 Units Subcutaneous Once [place], Chioma E, NP        Not on File  ROS Review of Systems  Constitutional: Positive for fatigue. Negative for fever and unexpected weight change.  HENT: Negative.   Eyes: Negative for visual disturbance.  Respiratory: Negative.   Cardiovascular: Positive for palpitations (occasional). Negative for chest pain and leg swelling.  Gastrointestinal: Negative.   Endocrine: Negative.   Genitourinary: Negative.   Musculoskeletal: Negative.   Skin: Negative.   Neurological: Positive for dizziness (occasional episodes), weakness (occasional episodes) and light-headedness (occasional). Negative for syncope, numbness and headaches.  Psychiatric/Behavioral: Negative.       Objective:    Physical Exam Constitutional:      Appearance: Normal appearance. He is obese.  HENT:     Head: Normocephalic.     Mouth/Throat:     Mouth: Mucous membranes are moist.     Pharynx: Oropharynx is clear.  Eyes:     Conjunctiva/sclera: Conjunctivae normal.     Pupils: Pupils are equal, round, and reactive to light.  Cardiovascular:     Rate and Rhythm: Normal rate and regular rhythm.     Pulses: Normal pulses.     Heart sounds: Normal heart sounds.  Pulmonary:     Effort: Pulmonary effort is normal.     Breath sounds: Normal breath sounds.  Abdominal:     General: Bowel sounds are  normal. There is no distension.     Palpations: Abdomen is soft.     Tenderness: There is no abdominal tenderness.  Skin:    General: Skin is warm and dry.  Neurological:     General: No focal deficit present.     Mental Status: He is alert and oriented to person, place, and time. Mental status is at baseline.     Motor: No weakness.     Gait: Gait normal.  Psychiatric:        Mood and Affect: Mood normal.        Behavior: Behavior normal.     There were no vitals taken for this visit. Wt Readings from Last 3 Encounters:  02/17/21 198 lb 6.6 oz (90 kg)  02/03/21 198 lb (89.8 kg)  12/25/20 199 lb 4.8 oz (90.4 kg)     Health Maintenance Due  Topic Date Due  . Hepatitis C Screening  Never done  . PNEUMOCOCCAL POLYSACCHARIDE VACCINE AGE 17-64 HIGH RISK  Never done  . COVID-19 Vaccine (1) Never done  . OPHTHALMOLOGY EXAM  Never done  . TETANUS/TDAP  Never done    There are no preventive care reminders to display for this patient.  Lab Results  Component Value Date   TSH 0.891 02/28/2020   Lab Results  Component Value Date   WBC 5.5 09/13/2020   HGB 15.3 09/13/2020   HCT 46.2 09/13/2020   MCV 87.2 09/13/2020   PLT 181 09/13/2020   Lab Results  Component Value Date   NA 124 (L) 12/18/2020   K 5.1 12/18/2020   CO2 19 (L) 12/18/2020   GLUCOSE 718 (HH) 12/18/2020   BUN 13 12/18/2020   CREATININE 1.40 (H) 12/18/2020   BILITOT 0.2 12/18/2020   ALKPHOS 118 12/18/2020   AST 30 12/18/2020   ALT 25 12/18/2020   PROT 7.3 12/18/2020   ALBUMIN 4.3 12/18/2020   CALCIUM 9.2 12/18/2020   ANIONGAP 2 (L) 09/23/2020   Lab Results  Component Value Date   CHOL 276 (H) 12/18/2020   Lab Results  Component Value Date   HDL 13 (L) 12/18/2020   Lab Results  Component Value Date   LDLCALC Comment (A) 12/18/2020   Lab Results  Component Value Date   TRIG 2,834 ([place]) 12/18/2020  Lab Results  Component Value Date   CHOLHDL 21.2 (H) 12/18/2020   Lab Results  Component  Value Date   HGBA1C 9.9 (A) 12/18/2020   HGBA1C 9.9 12/18/2020   HGBA1C 0 (A) 12/18/2020   HGBA1C 0.0 12/18/2020      Assessment & Plan:   1. Type 2 diabetes mellitus with diabetic neuropathy, without long-term current use of insulin (HCC) Glycemic control improved; however, suspect patient likely experiencing hypoglycemic episodes related to meal time insulin. Instructed to check BS TID, before administering meal time insulin. Pt educated to not administer any meal time insulin if BS <100 mg/dL, 3 units if BS 100-150 AND pt is going to eat a meal, 5 units if BS 150-200, and 10 units if >200. Maintain a log of blood sugar readings and bring to the next appointment to determine appropriate dose adjustments. If symptoms of hypoglycemia experienced or BS <70, eat a high carb snack or drink and recheck sugar after consumption. Will continue other current medications and recheck Hgb A1c and CMP in one month. Continue daily self foot exams and monitor for any unhealing wounds. Monitor intake of carbs and continue to avoid sugary beverages. Exercise 150 min/week as tolerated.  - POCT Glucose (CBG); Future - POCT Glucose (CBG) - HgB A1c; Future - Comp Met (CMET); Future  2. Snores Pt informed by anesthesia of periods of apnea after colonoscopy. He also reports a history of snoring and fatigue. Will refer for sleep study and determine need for CPAP. - Ambulatory referral to Neurology  3. Hyperlipidemia associated with type 2 diabetes mellitus (HCC) Triglyceride level was 2,834 mg/dL and total cholesterol 276 mg/dL upon previous check. He denies any abdominal pain, nausea, or vomiting. Pt's pravastatin was increased to 80 mg. No report of myalgias or dark colored urine. Will continue current medication regimen and recheck lipid panel to determine further intervention if necessary.  - Lipid Profile; Future  4. Iron deficiency anemia, unspecified iron deficiency anemia type Pt reports fatigue. Denies  any excessive bruising. Pt has a history of IDA; however, most recent CBC showed hgb 15.3 g/dL with no current treatment. Will recheck CBC and determine if further treatment necessary.  - CBC w/Diff; Future    Follow-up: Return in 9 days (02/27/21), or if symptoms worsen or do not improve.   Clayton Bibles, RN, BSN, FNP-S

## 2021-02-19 ENCOUNTER — Telehealth: Payer: Self-pay

## 2021-02-19 ENCOUNTER — Other Ambulatory Visit: Payer: Self-pay

## 2021-02-19 NOTE — Telephone Encounter (Signed)
Medical records request processed.  Called and spoke with patient to inform him records ready for pick up with [facility]'s front desk.

## 2021-02-27 ENCOUNTER — Ambulatory Visit: Payer: Self-pay | Admitting: Gerontology

## 2021-02-27 ENCOUNTER — Other Ambulatory Visit: Payer: Self-pay

## 2021-02-27 ENCOUNTER — Encounter: Payer: Self-pay | Admitting: Gerontology

## 2021-02-27 VITALS — BP 119/82 | HR 85 | Temp 97.1°F | Resp 18 | Ht 68.0 in | Wt 201.4 lb

## 2021-02-27 DIAGNOSIS — E1140 Type 2 diabetes mellitus with diabetic neuropathy, unspecified: Secondary | ICD-10-CM

## 2021-02-27 NOTE — Progress Notes (Signed)
Established Patient Office Visit  Subjective:  Patient ID: Luke [name], male    DOB: [birth year]  Age: 40 y.o. MRN: [id number]  CC:  Chief Complaint  Patient presents with  . Follow-up  . Hypertension  . Diabetes  . Medication Refill    HPI Luke [name] 40 y/o male who has history of Anemia, Colon cancer, Hypertension and T2DM, presents for follow up of T2DM. His HgbA1c done on 12/18/20 was 9.9%, and he states that he's compliant with his medication and continues to check his fasting blood glucose daily. He states that it was 120 mg/dl today and his  Blood glucose was 226 mg/dl when checked during visit. He denies hypo/hyperglycemic symptoms and his peripheral neuropathy is under control with taking gabapentin. Overall, he states that he's doing well and offers no further complaint.  Past Medical History:  Diagnosis Date  . Anemia   . Cancer ([place])    colon ca  . Cecum mass   . Chronic kidney disease   . Colonic neoplasm   . Diabetes mellitus without complication ([place])   . Diverticulosis of large intestine without diverticulitis   . GERD (gastroesophageal reflux disease)   . Hypertension   . Pneumonia 09/07/2016  . Sleep apnea   . Stomach irritation     Past Surgical History:  Procedure Laterality Date  . COLON SURGERY    . COLONOSCOPY WITH PROPOFOL N/A 07/28/2018   Procedure: COLONOSCOPY WITH PROPOFOL;  Surgeon: Virgel Manifold, MD;  Location: [facility];  Service: Endoscopy;  Laterality: N/A;  . COLONOSCOPY WITH PROPOFOL N/A 10/18/2019   Procedure: COLONOSCOPY WITH PROPOFOL;  Surgeon: Virgel Manifold, MD;  Location: [facility];  Service: Endoscopy;  Laterality: N/A;  . COLONOSCOPY WITH PROPOFOL N/A 02/17/2021   Procedure: COLONOSCOPY WITH PROPOFOL;  Surgeon: Virgel Manifold, MD;  Location: [facility];  Service: Endoscopy;  Laterality: N/A;  . ESOPHAGOGASTRODUODENOSCOPY (EGD) WITH PROPOFOL N/A 07/28/2018   Procedure: ESOPHAGOGASTRODUODENOSCOPY  (EGD) WITH PROPOFOL;  Surgeon: Virgel Manifold, MD;  Location: [facility];  Service: Endoscopy;  Laterality: N/A;  . PARTIAL COLECTOMY Right 08/15/2018   Procedure: RIGHT  COLECTOMY;  Surgeon: Vickie Epley, MD;  Location: [facility]S;  Service: General;  Laterality: Right;    Family History  Problem Relation Age of Onset  . CAD Maternal Grandmother   . Breast cancer Maternal Grandmother   . Breast cancer Mother   . Hypertension Mother   . Hypertension Father   . Colon cancer Paternal Uncle   . Throat cancer Maternal Grandfather   . Diabetes Paternal Grandmother     Social History   Socioeconomic History  . Marital status: Single    Spouse name: Not on file  . Number of children: Not on file  . Years of education: Not on file  . Highest education level: Not on file  Occupational History  . Not on file  Tobacco Use  . Smoking status: Current Some Day Smoker    Packs/day: 0.10    Types: Cigarettes  . Smokeless tobacco: Never Used  . Tobacco comment: patient is social smoker/ smokes when he drinks   Vaping Use  . Vaping Use: Never used  Substance and Sexual Activity  . Alcohol use: Yes    Alcohol/week: 1.0 standard drink    Types: 1 Cans of beer per week    Comment: patient reports he consumes ~2 beers about once per month  . Drug use: Not Currently    Types: Marijuana,  Cocaine    Comment: last use 2020  . Sexual activity: Yes    Birth control/protection: Condom  Other Topics Concern  . Not on file  Social History Narrative  . Not on file   Social Determinants of Health   Financial Resource Strain: Not on file  Food Insecurity: No Food Insecurity  . Worried About Charity fundraiser in the Last Year: Never true  . Ran Out of Food in the Last Year: Never true  Transportation Needs: No Transportation Needs  . Lack of Transportation (Medical): No  . Lack of Transportation (Non-Medical): No  Physical Activity: Not on file  Stress: Not on file  Social  Connections: Not on file  Intimate Partner Violence: Not on file    Outpatient Medications Prior to Visit  Medication Sig Dispense Refill  . amLODipine (NORVASC) 5 MG tablet TAKE ONE TABLET BY MOUTH EVERY DAY 90 tablet 1  . blood glucose meter kit and supplies KIT Dispense based on patient and insurance preference. Use up to four times daily as directed. (FOR ICD-9 250.00, 250.01). 1 each 0  . COMFORT EZ PEN NEEDLES 32G X 4 MM MISC [place] AS DIRECTED WITH BASAGLAR 100 each 11  . gabapentin (NEURONTIN) 100 MG capsule TAKE ONE CAPSULE BY MOUTH AT BEDTIME 30 capsule 2  . Insulin Glargine (BASAGLAR KWIKPEN) 100 UNIT/ML INJECT 26 UNITS INTO THE SKIN AT BEDTIME 5 mL 3  . insulin lispro (HUMALOG) 100 UNIT/ML KwikPen INJECT 0.1 ML (10 UNITS TOTAL) INTO THE SKIN 3 TIMES A DAY BEFORE MEALS 15 mL 5  . pravastatin (PRAVACHOL) 80 MG tablet Take 1 tablet (80 mg total) by mouth daily. 30 tablet 2  . polyethylene glycol-electrolytes (NULYTELY) 420 g solution Starting at 5:00 PM: Drink one 8 oz glass of mixture every 15 minutes until you finish half of the jug. Five hours prior to procedure, drink 8 oz glass of mixture every 15 minutes until it is all gone. Make sure you do not drink anything 4 hours prior to your procedure. (Patient not taking: Reported on 02/18/2021) 4000 mL 0   Facility-Administered Medications Prior to Visit  Medication Dose Route Frequency Provider Last Rate Last Admin  . insulin lispro (HUMALOG) injection 10 Units  10 Units Subcutaneous Once Yamari Ventola E, NP        No Known Allergies  ROS Review of Systems  Constitutional: Negative.   Eyes: Negative.   Respiratory: Negative.   Cardiovascular: Negative.   Endocrine: Negative.   Neurological: Negative.   Psychiatric/Behavioral: Negative.       Objective:    Physical Exam HENT:     Head: Normocephalic and atraumatic.  Eyes:     Extraocular Movements: Extraocular movements intact.     Conjunctiva/sclera: Conjunctivae  normal.     Pupils: Pupils are equal, round, and reactive to light.  Cardiovascular:     Rate and Rhythm: Normal rate and regular rhythm.     Pulses: Normal pulses.     Heart sounds: Normal heart sounds.  Pulmonary:     Effort: Pulmonary effort is normal.     Breath sounds: Normal breath sounds.  Skin:    General: Skin is warm.  Neurological:     General: No focal deficit present.     Mental Status: He is alert and oriented to person, place, and time. Mental status is at baseline.  Psychiatric:        Mood and Affect: Mood normal.  Behavior: Behavior normal.        Thought Content: Thought content normal.        Judgment: Judgment normal.     BP 119/82 (BP Location: Left Arm, Patient Position: Sitting, Cuff Size: Large)   Pulse 85   Temp (!) 97.1 F (36.2 C)   Resp 18   Ht '5\' 8"'  (1.727 m)   Wt 201 lb 6.4 oz (91.4 kg)   SpO2 97%   BMI 30.62 kg/m  Wt Readings from Last 3 Encounters:  02/27/21 201 lb 6.4 oz (91.4 kg)  02/18/21 200 lb 6.4 oz (90.9 kg)  02/17/21 198 lb 6.6 oz (90 kg)   Weight loss encouraged  Health Maintenance Due  Topic Date Due  . Hepatitis C Screening  Never done  . PNEUMOCOCCAL POLYSACCHARIDE VACCINE AGE 55-64 HIGH RISK  Never done  . COVID-19 Vaccine (1) Never done  . OPHTHALMOLOGY EXAM  Never done  . TETANUS/TDAP  Never done    There are no preventive care reminders to display for this patient.  Lab Results  Component Value Date   TSH 0.891 02/28/2020   Lab Results  Component Value Date   WBC 5.5 09/13/2020   HGB 15.3 09/13/2020   HCT 46.2 09/13/2020   MCV 87.2 09/13/2020   PLT 181 09/13/2020   Lab Results  Component Value Date   NA 124 (L) 12/18/2020   K 5.1 12/18/2020   CO2 19 (L) 12/18/2020   GLUCOSE 718 (HH) 12/18/2020   BUN 13 12/18/2020   CREATININE 1.40 (H) 12/18/2020   BILITOT 0.2 12/18/2020   ALKPHOS 118 12/18/2020   AST 30 12/18/2020   ALT 25 12/18/2020   PROT 7.3 12/18/2020   ALBUMIN 4.3 12/18/2020    CALCIUM 9.2 12/18/2020   ANIONGAP 2 (L) 09/23/2020   Lab Results  Component Value Date   CHOL 276 (H) 12/18/2020   Lab Results  Component Value Date   HDL 13 (L) 12/18/2020   Lab Results  Component Value Date   LDLCALC Comment (A) 12/18/2020   Lab Results  Component Value Date   TRIG 2,834 ([place]) 12/18/2020   Lab Results  Component Value Date   CHOLHDL 21.2 (H) 12/18/2020   Lab Results  Component Value Date   HGBA1C 9.9 (A) 12/18/2020   HGBA1C 9.9 12/18/2020   HGBA1C 0 (A) 12/18/2020   HGBA1C 0.0 12/18/2020      Assessment & Plan:    1. Type 2 diabetes mellitus with diabetic neuropathy, unspecified whether long term insulin use (HCC) - His HgbA1c was 9.9%, his goal should be less than 7%, He will continue on current treatment regimen, ow carb/ non concentrated sweet diet and exercise as tolerated. - POCT Glucose (CBG); Future   Follow-up: Return in about 26 days (around 03/25/2021), or if symptoms worsen or fail to improve.    Alaila Pillard Jerold Coombe, NP

## 2021-02-27 NOTE — Patient Instructions (Signed)

## 2021-03-07 ENCOUNTER — Other Ambulatory Visit: Payer: Self-pay

## 2021-03-11 ENCOUNTER — Other Ambulatory Visit: Payer: Self-pay

## 2021-03-11 MED FILL — Amlodipine Besylate Tab 5 MG (Base Equivalent): ORAL | 30 days supply | Qty: 30 | Fill #0 | Status: AC

## 2021-03-12 ENCOUNTER — Ambulatory Visit: Payer: Self-pay | Admitting: Surgery

## 2021-03-17 ENCOUNTER — Other Ambulatory Visit: Payer: Self-pay

## 2021-03-17 ENCOUNTER — Ambulatory Visit: Payer: Self-pay | Admitting: Pharmacy Technician

## 2021-03-17 DIAGNOSIS — Z79899 Other long term (current) drug therapy: Secondary | ICD-10-CM

## 2021-03-17 NOTE — Progress Notes (Signed)
  Completed [facility] application and contract.  Patient agreed to all terms of the [facility] contract.   Patient approved to receive medication assistance at [facility] until time for re-certification in 1287, as long as eligibility criteria continues to be met.    Provided patient with Civil engineer, contracting based on his particular needs.    [facility]

## 2021-03-19 ENCOUNTER — Other Ambulatory Visit: Payer: Self-pay

## 2021-03-19 DIAGNOSIS — E1140 Type 2 diabetes mellitus with diabetic neuropathy, unspecified: Secondary | ICD-10-CM

## 2021-03-19 DIAGNOSIS — E1169 Type 2 diabetes mellitus with other specified complication: Secondary | ICD-10-CM

## 2021-03-19 DIAGNOSIS — E785 Hyperlipidemia, unspecified: Secondary | ICD-10-CM

## 2021-03-19 DIAGNOSIS — D509 Iron deficiency anemia, unspecified: Secondary | ICD-10-CM

## 2021-03-20 ENCOUNTER — Other Ambulatory Visit: Payer: Self-pay

## 2021-03-20 LAB — CBC WITH DIFFERENTIAL/PLATELET
Basophils Absolute: 0.1 10*3/uL (ref 0.0–0.2)
Basos: 1 %
EOS (ABSOLUTE): 0.1 10*3/uL (ref 0.0–0.4)
Eos: 2 %
Hematocrit: 47.3 % (ref 37.5–51.0)
Hemoglobin: 15.7 g/dL (ref 13.0–17.7)
Immature Grans (Abs): 0 10*3/uL (ref 0.0–0.1)
Immature Granulocytes: 0 %
Lymphocytes Absolute: 2.3 10*3/uL (ref 0.7–3.1)
Lymphs: 42 %
MCH: 28.7 pg (ref 26.6–33.0)
MCHC: 33.2 g/dL (ref 31.5–35.7)
MCV: 87 fL (ref 79–97)
Monocytes Absolute: 0.4 10*3/uL (ref 0.1–0.9)
Monocytes: 7 %
Neutrophils Absolute: 2.6 10*3/uL (ref 1.4–7.0)
Neutrophils: 48 %
Platelets: 173 10*3/uL (ref 150–450)
RBC: 5.47 x10E6/uL (ref 4.14–5.80)
RDW: 12.7 % (ref 11.6–15.4)
WBC: 5.4 10*3/uL (ref 3.4–10.8)

## 2021-03-20 LAB — COMPREHENSIVE METABOLIC PANEL
ALT: 20 IU/L (ref 0–44)
AST: 17 IU/L (ref 0–40)
Albumin/Globulin Ratio: 1.6 (ref 1.2–2.2)
Albumin: 4.3 g/dL (ref 4.0–5.0)
Alkaline Phosphatase: 78 IU/L (ref 44–121)
BUN/Creatinine Ratio: 8 — ABNORMAL LOW (ref 9–20)
BUN: 9 mg/dL (ref 6–24)
Bilirubin Total: 0.4 mg/dL (ref 0.0–1.2)
CO2: 20 mmol/L (ref 20–29)
Calcium: 9.3 mg/dL (ref 8.7–10.2)
Chloride: 102 mmol/L (ref 96–106)
Creatinine, Ser: 1.08 mg/dL (ref 0.76–1.27)
Globulin, Total: 2.7 g/dL (ref 1.5–4.5)
Glucose: 120 mg/dL — ABNORMAL HIGH (ref 65–99)
Potassium: 3.8 mmol/L (ref 3.5–5.2)
Sodium: 142 mmol/L (ref 134–144)
Total Protein: 7 g/dL (ref 6.0–8.5)
eGFR: 88 mL/min/{1.73_m2} (ref >59)

## 2021-03-20 LAB — LIPID PANEL
Chol/HDL Ratio: 7.5 ratio — ABNORMAL HIGH (ref 0.0–5.0)
Cholesterol, Total: 195 mg/dL (ref 100–199)
HDL: 26 mg/dL — ABNORMAL LOW (ref >39)
LDL Chol Calc (NIH): 126 mg/dL — ABNORMAL HIGH (ref 0–99)
Triglycerides: 239 mg/dL — ABNORMAL HIGH (ref 0–149)
VLDL Cholesterol Cal: 43 mg/dL — ABNORMAL HIGH (ref 5–40)

## 2021-03-20 LAB — HEMOGLOBIN A1C
Est. average glucose Bld gHb Est-mCnc: 243 mg/dL
Hgb A1c MFr Bld: 10.1 % — ABNORMAL HIGH (ref 4.8–5.6)

## 2021-03-21 ENCOUNTER — Other Ambulatory Visit: Payer: Self-pay

## 2021-03-25 ENCOUNTER — Other Ambulatory Visit: Payer: Self-pay

## 2021-03-25 ENCOUNTER — Ambulatory Visit: Payer: Self-pay | Admitting: Gerontology

## 2021-03-25 ENCOUNTER — Encounter: Payer: Self-pay | Admitting: Hematology and Oncology

## 2021-03-25 ENCOUNTER — Encounter: Payer: Self-pay | Admitting: Gerontology

## 2021-03-25 VITALS — BP 138/95 | HR 78 | Temp 97.7°F | Resp 16 | Ht 68.0 in | Wt 202.8 lb

## 2021-03-25 DIAGNOSIS — E785 Hyperlipidemia, unspecified: Secondary | ICD-10-CM

## 2021-03-25 DIAGNOSIS — E1140 Type 2 diabetes mellitus with diabetic neuropathy, unspecified: Secondary | ICD-10-CM

## 2021-03-25 DIAGNOSIS — E1169 Type 2 diabetes mellitus with other specified complication: Secondary | ICD-10-CM

## 2021-03-25 LAB — GLUCOSE, POCT (MANUAL RESULT ENTRY): POC Glucose: 138 mg/dl — AB (ref 70–99)

## 2021-03-25 MED ORDER — PRAVASTATIN SODIUM 40 MG PO TABS
80.0000 mg | ORAL_TABLET | Freq: Every day | ORAL | 2 refills | Status: DC
  Filled 2021-03-25: qty 60, 30d supply, fill #0
  Filled 2021-04-28: qty 60, 30d supply, fill #1
  Filled 2021-05-27: qty 60, 30d supply, fill #2

## 2021-03-25 MED ORDER — GABAPENTIN 100 MG PO CAPS
200.0000 mg | ORAL_CAPSULE | Freq: Every day | ORAL | 1 refills | Status: DC
Start: 2021-03-25 — End: 2021-04-29
  Filled 2021-03-25 – 2021-03-27 (×2): qty 60, 30d supply, fill #0
  Filled 2021-04-28: qty 60, 30d supply, fill #1

## 2021-03-25 MED ORDER — BASAGLAR KWIKPEN 100 UNIT/ML ~~LOC~~ SOPN
29.0000 [IU] | PEN_INJECTOR | Freq: Every day | SUBCUTANEOUS | 3 refills | Status: DC
  Filled 2021-03-25 (×2): qty 15, 51d supply, fill #0
  Filled 2021-03-27: qty 15, 52d supply, fill #0

## 2021-03-25 NOTE — Progress Notes (Signed)
Established Patient Office Visit  Subjective:  Patient ID: Luke [name], male    DOB: 08-22-1981  Age: 40 y.o. MRN: [id number]  CC:  Chief Complaint  Patient presents with  . Follow-up    Lab results from 03/19/21  . Diabetes  . Hypertension  . Hand Pain    left  . Medication Refill    Pravastatin (been out ~2 weeks)    HPI Luke [name] 40 y/o male who has history of Anemia, Colon cancer, Hypertension and T2DM, presents for follow up of T2DM. His HgbA1c done on 03/19/21 increased from 9.9% to 10.1%. and he states that he's compliant with his medication and continues to check his fasting blood glucose at least every other day, and this morning was 150 mg/dl. It was 138 mg/dl during visit He denies hypo/hyperglycemic symptoms and his peripheral neuropathy is not under control with taking 100 mg gabapentin. He states that he takes Tylenol for neuropathic pain with minimal relief. His Lipid panel done on 03/19/21, Total cholesterol decreased from 276 to 195 mg/dl, Triglycerides from 2,834 to 239 mg/dl, HDL increased from 13 to 26 mg/dl and LDL was 126 mg/dl. He states that he's been out of his Pravastatin for 2 weeks. He denies chest pain, palpitation, dizziness, myalgia, and dark urine. Overall, he states that he's doing well and offers no further complaint.  Past Medical History:  Diagnosis Date  . Anemia   . Cancer ([place])    colon ca  . Cecum mass   . Chronic kidney disease   . Colonic neoplasm   . Diabetes mellitus without complication ([place])   . Diverticulosis of large intestine without diverticulitis   . GERD (gastroesophageal reflux disease)   . Hypertension   . Pneumonia 09/07/2016  . Sleep apnea   . Stomach irritation     Past Surgical History:  Procedure Laterality Date  . COLON SURGERY    . COLONOSCOPY WITH PROPOFOL N/A 07/28/2018   Procedure: COLONOSCOPY WITH PROPOFOL;  Surgeon: Virgel Manifold, MD;  Location: [facility];  Service: Endoscopy;  Laterality:  N/A;  . COLONOSCOPY WITH PROPOFOL N/A 10/18/2019   Procedure: COLONOSCOPY WITH PROPOFOL;  Surgeon: Virgel Manifold, MD;  Location: [facility];  Service: Endoscopy;  Laterality: N/A;  . COLONOSCOPY WITH PROPOFOL N/A 02/17/2021   Procedure: COLONOSCOPY WITH PROPOFOL;  Surgeon: Virgel Manifold, MD;  Location: [facility];  Service: Endoscopy;  Laterality: N/A;  . ESOPHAGOGASTRODUODENOSCOPY (EGD) WITH PROPOFOL N/A 07/28/2018   Procedure: ESOPHAGOGASTRODUODENOSCOPY (EGD) WITH PROPOFOL;  Surgeon: Virgel Manifold, MD;  Location: [facility];  Service: Endoscopy;  Laterality: N/A;  . PARTIAL COLECTOMY Right 08/15/2018   Procedure: RIGHT  COLECTOMY;  Surgeon: Vickie Epley, MD;  Location: [facility];  Service: General;  Laterality: Right;    Family History  Problem Relation Age of Onset  . CAD Maternal Grandmother   . Breast cancer Maternal Grandmother   . Breast cancer Mother   . Hypertension Mother   . Hypertension Father   . Colon cancer Paternal Uncle   . Throat cancer Maternal Grandfather   . Diabetes Paternal Grandmother     Social History   Socioeconomic History  . Marital status: Single    Spouse name: Not on file  . Number of children: Not on file  . Years of education: Not on file  . Highest education level: Not on file  Occupational History  . Not on file  Tobacco Use  . Smoking status: Former Smoker  Packs/day: 0.10    Types: Cigarettes    Quit date: 03/25/2020    Years since quitting: 1.0  . Smokeless tobacco: Never Used  . Tobacco comment: patient is social smoker/ smokes when he drinks   Vaping Use  . Vaping Use: Never used  Substance and Sexual Activity  . Alcohol use: Yes    Alcohol/week: 1.0 standard drink    Types: 1 Cans of beer per week    Comment: patient reports he consumes ~2 beers about once per month  . Drug use: Not Currently    Types: Marijuana, Cocaine    Comment: last use 2020  . Sexual activity: Yes    Birth  control/protection: Condom  Other Topics Concern  . Not on file  Social History Narrative  . Not on file   Social Determinants of Health   Financial Resource Strain: Not on file  Food Insecurity: No Food Insecurity  . Worried About Charity fundraiser in the Last Year: Never true  . Ran Out of Food in the Last Year: Never true  Transportation Needs: No Transportation Needs  . Lack of Transportation (Medical): No  . Lack of Transportation (Non-Medical): No  Physical Activity: Not on file  Stress: Not on file  Social Connections: Not on file  Intimate Partner Violence: Not on file    Outpatient Medications Prior to Visit  Medication Sig Dispense Refill  . amLODipine (NORVASC) 5 MG tablet TAKE ONE TABLET BY MOUTH EVERY DAY 90 tablet 1  . blood glucose meter kit and supplies KIT Dispense based on patient and insurance preference. Use up to four times daily as directed. (FOR ICD-9 250.00, 250.01). 1 each 0  . COMFORT EZ PEN NEEDLES 32G X 4 MM MISC [place] AS DIRECTED WITH BASAGLAR 100 each 11  . insulin lispro (HUMALOG) 100 UNIT/ML KwikPen INJECT 0.1 ML (10 UNITS TOTAL) INTO THE SKIN 3 TIMES A DAY BEFORE MEALS 15 mL 5  . gabapentin (NEURONTIN) 100 MG capsule TAKE ONE CAPSULE BY MOUTH AT BEDTIME 30 capsule 2  . Insulin Glargine (BASAGLAR KWIKPEN) 100 UNIT/ML INJECT 26 UNITS INTO THE SKIN AT BEDTIME 5 mL 3  . pravastatin (PRAVACHOL) 80 MG tablet Take 1 tablet (80 mg total) by mouth daily. (Patient not taking: Reported on 03/25/2021) 30 tablet 2  . insulin lispro (HUMALOG) injection 10 Units      No facility-administered medications prior to visit.    No Known Allergies  ROS Review of Systems  Constitutional: Negative.   Eyes: Negative.   Respiratory: Negative.   Cardiovascular: Negative.   Endocrine: Negative.   Skin: Negative.   Neurological: Negative.   Psychiatric/Behavioral: Negative.       Objective:    Physical Exam HENT:     Head: Normocephalic and atraumatic.  Eyes:      Extraocular Movements: Extraocular movements intact.     Conjunctiva/sclera: Conjunctivae normal.     Pupils: Pupils are equal, round, and reactive to light.  Cardiovascular:     Rate and Rhythm: Normal rate and regular rhythm.     Pulses: Normal pulses.     Heart sounds: Normal heart sounds.  Pulmonary:     Effort: Pulmonary effort is normal.     Breath sounds: Normal breath sounds.  Skin:    General: Skin is warm.  Neurological:     General: No focal deficit present.     Mental Status: He is alert and oriented to person, place, and time. Mental status is at baseline.  Psychiatric:        Mood and Affect: Mood normal.        Behavior: Behavior normal.        Thought Content: Thought content normal.        Judgment: Judgment normal.     BP (!) 138/95 (BP Location: Right Arm, Patient Position: Sitting, Cuff Size: Large)   Pulse 78   Temp 97.7 F (36.5 C)   Resp 16   Ht _0  (1.727 m)   Wt 202 lb 12.8 oz (92 kg)   SpO2 96%   BMI 30.84 kg/m  Wt Readings from Last 3 Encounters:  03/25/21 202 lb 12.8 oz (92 kg)  03/19/21 200 lb 3.2 oz (90.8 kg)  02/27/21 201 lb 6.4 oz (91.4 kg)   Weight loss encouraged  Health Maintenance Due  Topic Date Due  . PNEUMOCOCCAL POLYSACCHARIDE VACCINE AGE 80-64 HIGH RISK  Never done  . COVID-19 Vaccine (1) Never done  . Hepatitis C Screening  Never done  . TETANUS/TDAP  Never done    There are no preventive care reminders to display for this patient.  Lab Results  Component Value Date   TSH 0.891 02/28/2020   Lab Results  Component Value Date   WBC 5.4 03/19/2021   HGB 15.7 03/19/2021   HCT 47.3 03/19/2021   MCV 87 03/19/2021   PLT 173 03/19/2021   Lab Results  Component Value Date   NA 142 03/19/2021   K 3.8 03/19/2021   CO2 20 03/19/2021   GLUCOSE 120 (H) 03/19/2021   BUN 9 03/19/2021   CREATININE 1.08 03/19/2021   BILITOT 0.4 03/19/2021   ALKPHOS 78 03/19/2021   AST 17 03/19/2021   ALT 20 03/19/2021   PROT 7.0  03/19/2021   ALBUMIN 4.3 03/19/2021   CALCIUM 9.3 03/19/2021   ANIONGAP 2 (L) 09/23/2020   EGFR 88 03/19/2021   Lab Results  Component Value Date   CHOL 195 03/19/2021   Lab Results  Component Value Date   HDL 26 (L) 03/19/2021   Lab Results  Component Value Date   LDLCALC 126 (H) 03/19/2021   Lab Results  Component Value Date   TRIG 239 (H) 03/19/2021   Lab Results  Component Value Date   CHOLHDL 7.5 (H) 03/19/2021   Lab Results  Component Value Date   HGBA1C 10.1 (H) 03/19/2021      Assessment & Plan:    1. Type 2 diabetes mellitus with diabetic neuropathy, without long-term current use of insulin (HCC) - His diabetes is not under control, his HgbA1c was 10.1%and his goal should be less than 7%. His Basaglar was increased to 29 units, was encouraged to check blood glucose tid, record and bring log/meter to follow up appointment. He was encouraged to continue on low carb/non concentrated sweet diet and exercise as tolerated. His Gabapentin was increased to 200 mg daily. He was encouraged to perform daily foot check and exercises as tolerated. - Insulin Glargine (BASAGLAR KWIKPEN) 100 UNIT/ML; Inject 29 Units into the skin at bedtime.  Dispense: 15 mL; Refill: 3 - gabapentin (NEURONTIN) 100 MG capsule; Take 2 capsules (200 mg total) by mouth at bedtime.  Dispense: 60 capsule; Refill: 1 - POCT Glucose (CBG); Future - POCT Glucose (CBG)  2. Hyperlipidemia associated with type 2 diabetes mellitus ([place]) -The 10-year ASCVD risk score Mikey Bussing DC Jr., et al., 2013) is: 13.9%   Values used to calculate the score:     Age: 45 years  Sex: Male     Is Non-Hispanic African American: Yes     Diabetic: Yes     Tobacco smoker: No     Systolic Blood Pressure: 834 mmHg     Is BP treated: Yes     HDL Cholesterol: 26 mg/dL     Total Cholesterol: 195 mg/dL His ASCVD risk was 13.9%, he will continue on Pravastatin, encouraged on low fat/cholesterol diet and exercise as tolerated. -  pravastatin (PRAVACHOL) 40 MG tablet; Take 2 tablets (80 mg total) by mouth daily.  Dispense: 60 tablet; Refill: 2     Follow-up: Return in about 30 days (around 04/24/2021), or if symptoms worsen or fail to improve.    [place] Luppino Jerold Coombe, NP

## 2021-03-25 NOTE — Patient Instructions (Signed)

## 2021-03-27 ENCOUNTER — Encounter: Payer: Self-pay | Admitting: Hematology and Oncology

## 2021-03-27 ENCOUNTER — Other Ambulatory Visit: Payer: Self-pay

## 2021-03-27 MED FILL — Insulin Pen Needle 32 G X 4 MM (1/6" or 5/32"): 25 days supply | Qty: 100 | Fill #0 | Status: AC

## 2021-04-28 ENCOUNTER — Other Ambulatory Visit: Payer: Self-pay

## 2021-04-28 ENCOUNTER — Encounter: Payer: Self-pay | Admitting: Hematology and Oncology

## 2021-04-28 MED FILL — Amlodipine Besylate Tab 5 MG (Base Equivalent): ORAL | 30 days supply | Qty: 30 | Fill #1 | Status: AC

## 2021-04-28 MED FILL — Insulin Lispro Soln Pen-injector 100 Unit/ML (1 Unit Dial): SUBCUTANEOUS | 50 days supply | Qty: 15 | Fill #0 | Status: AC

## 2021-04-28 MED FILL — Insulin Pen Needle 32 G X 4 MM (1/6" or 5/32"): 25 days supply | Qty: 100 | Fill #1 | Status: AC

## 2021-04-29 ENCOUNTER — Encounter: Payer: Self-pay | Admitting: Gerontology

## 2021-04-29 ENCOUNTER — Other Ambulatory Visit: Payer: Self-pay

## 2021-04-29 ENCOUNTER — Ambulatory Visit: Payer: Self-pay | Admitting: Gerontology

## 2021-04-29 ENCOUNTER — Encounter: Payer: Self-pay | Admitting: Hematology and Oncology

## 2021-04-29 VITALS — BP 130/100 | HR 75 | Temp 97.3°F | Ht 68.0 in | Wt 205.4 lb

## 2021-04-29 DIAGNOSIS — I10 Essential (primary) hypertension: Secondary | ICD-10-CM

## 2021-04-29 DIAGNOSIS — E1140 Type 2 diabetes mellitus with diabetic neuropathy, unspecified: Secondary | ICD-10-CM

## 2021-04-29 DIAGNOSIS — Z8669 Personal history of other diseases of the nervous system and sense organs: Secondary | ICD-10-CM | POA: Insufficient documentation

## 2021-04-29 LAB — GLUCOSE, POCT (MANUAL RESULT ENTRY): POC Glucose: 168 mg/dl — AB (ref 70–99)

## 2021-04-29 MED ORDER — GABAPENTIN 300 MG PO CAPS
300.0000 mg | ORAL_CAPSULE | Freq: Every day | ORAL | 1 refills | Status: DC
  Filled 2021-04-29: qty 30, 30d supply, fill #0
  Filled 2021-05-27: qty 30, 30d supply, fill #1

## 2021-04-29 MED ORDER — AMLODIPINE BESYLATE 5 MG PO TABS
5.0000 mg | ORAL_TABLET | Freq: Every day | ORAL | 1 refills | Status: DC
  Filled 2021-04-29 – 2021-05-27 (×2): qty 90, 90d supply, fill #0
  Filled 2021-08-25: qty 90, 90d supply, fill #1

## 2021-04-29 NOTE — Progress Notes (Signed)
Established Patient Office Visit  Subjective:  Patient ID: Luke [name], male    DOB: Mar 30, 1981  Age: 40 y.o. MRN: [id number]  CC:  Chief Complaint  Patient presents with   Follow-up    diabetes    HPI Luke [name] is a 40 y/o male who has history of T2DM, Hypertension, Anemia, Cecum mass, CKD, GERD, Sleep apnea, presents for follow up of T2DM. His HgbA1c done on 03/19/21 was 10.1%, he checks his blood glucose at least every other day and this morning was 120 mg/dl. His blood glucose was 168 mg/dl when checked during visit. He reports some episodes of hypoglycemia that is associated with light headedness during the day, but usually polyuria and Polydipsia.  He states that his light headedness has been going on for more than 6 months and his Engineer, manufacturing systems, Nutritional therapist requests a note for his community service to be discontinued. He denies vertigo, fall and vision changes.He states that his peripheral neuropathy is not relieved with taking 200 mg Gabapentin. His blood pressure was elevated during visit and he reports that he's out of his Amlodipine and will pick up from Pharmacy after visit. He also has a history of sleep apnea, continues to snore and wake up gasping for air. He will follow up at [facility] on 06/30/21. Overall, he states that he's doing well and offers no further complaint.   Past Medical History:  Diagnosis Date   Anemia    Cancer ([place])    colon ca   Cecum mass    Chronic kidney disease    Colonic neoplasm    Diabetes mellitus without complication ([place])    Diverticulosis of large intestine without diverticulitis    GERD (gastroesophageal reflux disease)    Hypertension    Pneumonia 09/07/2016   Sleep apnea    Stomach irritation     Past Surgical History:  Procedure Laterality Date   COLON SURGERY     COLONOSCOPY WITH PROPOFOL N/A 07/28/2018   Procedure: COLONOSCOPY WITH PROPOFOL;  Surgeon: Virgel Manifold, MD;  Location: [facility] ENDOSCOPY;   Service: Endoscopy;  Laterality: N/A;   COLONOSCOPY WITH PROPOFOL N/A 10/18/2019   Procedure: COLONOSCOPY WITH PROPOFOL;  Surgeon: Virgel Manifold, MD;  Location: [facility] ENDOSCOPY;  Service: Endoscopy;  Laterality: N/A;   COLONOSCOPY WITH PROPOFOL N/A 02/17/2021   Procedure: COLONOSCOPY WITH PROPOFOL;  Surgeon: Virgel Manifold, MD;  Location: [facility] ENDOSCOPY;  Service: Endoscopy;  Laterality: N/A;   ESOPHAGOGASTRODUODENOSCOPY (EGD) WITH PROPOFOL N/A 07/28/2018   Procedure: ESOPHAGOGASTRODUODENOSCOPY (EGD) WITH PROPOFOL;  Surgeon: Virgel Manifold, MD;  Location: [facility] ENDOSCOPY;  Service: Endoscopy;  Laterality: N/A;   PARTIAL COLECTOMY Right 08/15/2018   Procedure: RIGHT  COLECTOMY;  Surgeon: Vickie Epley, MD;  Location: [facility] ORS;  Service: General;  Laterality: Right;    Family History  Problem Relation Age of Onset   CAD Maternal Grandmother    Breast cancer Maternal Grandmother    Breast cancer Mother    Hypertension Mother    Hypertension Father    Colon cancer Paternal Uncle    Throat cancer Maternal Grandfather    Diabetes Paternal Grandmother     Social History   Socioeconomic History   Marital status: Single    Spouse name: Not on file   Number of children: Not on file   Years of education: Not on file   Highest education level: Not on file  Occupational History   Not on file  Tobacco Use  Smoking status: Former    Packs/day: 0.10    Pack years: 0.00    Types: Cigarettes    Quit date: 03/25/2020    Years since quitting: 1.0   Smokeless tobacco: Never   Tobacco comments:    patient is social smoker/ smokes when he drinks   Vaping Use   Vaping Use: Never used  Substance and Sexual Activity   Alcohol use: Yes    Alcohol/week: 1.0 standard drink    Types: 1 Cans of beer per week    Comment: patient reports he consumes ~2 beers about once per month   Drug use: Not Currently    Types: Marijuana, Cocaine    Comment: last use 2020   Sexual activity:  Yes    Birth control/protection: Condom  Other Topics Concern   Not on file  Social History Narrative   Not on file   Social Determinants of Health   Financial Resource Strain: Not on file  Food Insecurity: No Food Insecurity   Worried About Running Out of Food in the Last Year: Never true   Ran Out of Food in the Last Year: Never true  Transportation Needs: No Transportation Needs   Lack of Transportation (Medical): No   Lack of Transportation (Non-Medical): No  Physical Activity: Not on file  Stress: Not on file  Social Connections: Not on file  Intimate Partner Violence: Not on file    Outpatient Medications Prior to Visit  Medication Sig Dispense Refill   amLODipine (NORVASC) 5 MG tablet TAKE ONE TABLET BY MOUTH EVERY DAY 90 tablet 1   blood glucose meter kit and supplies KIT Dispense based on patient and insurance preference. Use up to four times daily as directed. (FOR ICD-9 250.00, 250.01). 1 each 0   COMFORT EZ PEN NEEDLES 32G X 4 MM MISC [place] AS DIRECTED WITH BASAGLAR 100 each 11   Insulin Glargine (BASAGLAR KWIKPEN) 100 UNIT/ML Inject 29 Units into the skin at bedtime. 15 mL 3   insulin lispro (HUMALOG) 100 UNIT/ML KwikPen INJECT 0.1 ML (10 UNITS TOTAL) INTO THE SKIN 3 TIMES A DAY BEFORE MEALS 15 mL 5   pravastatin (PRAVACHOL) 40 MG tablet Take 2 tablets (80 mg total) by mouth daily. 60 tablet 2   gabapentin (NEURONTIN) 100 MG capsule Take 2 capsules (200 mg total) by mouth at bedtime. (Patient not taking: Reported on 04/29/2021) 60 capsule 1   No facility-administered medications prior to visit.    No Known Allergies  ROS Review of Systems  Constitutional: Negative.   Eyes: Negative.   Respiratory: Negative.    Cardiovascular: Negative.   Endocrine: Positive for polydipsia and polyuria.  Neurological:  Positive for light-headedness (chronic).     Objective:    Physical Exam HENT:     Head: Normocephalic and atraumatic.  Eyes:     Extraocular Movements:  Extraocular movements intact.     Conjunctiva/sclera: Conjunctivae normal.     Pupils: Pupils are equal, round, and reactive to light.  Cardiovascular:     Rate and Rhythm: Normal rate and regular rhythm.     Pulses: Normal pulses.     Heart sounds: Normal heart sounds.  Pulmonary:     Effort: Pulmonary effort is normal.     Breath sounds: Normal breath sounds.  Skin:    General: Skin is warm.  Neurological:     General: No focal deficit present.     Mental Status: He is alert and oriented to person, place, and time. Mental  status is at baseline.  Psychiatric:        Mood and Affect: Mood normal.        Behavior: Behavior normal.        Thought Content: Thought content normal.        Judgment: Judgment normal.    BP (!) 130/100 (BP Location: Right Arm, Patient Position: Sitting, Cuff Size: Large)   Pulse 75   Temp (!) 97.3 F (36.3 C) (Temporal)   Ht _0  (1.727 m)   Wt 205 lb 6.4 oz (93.2 kg)   SpO2 96%   BMI 31.23 kg/m  Wt Readings from Last 3 Encounters:  04/29/21 205 lb 6.4 oz (93.2 kg)  03/25/21 202 lb 12.8 oz (92 kg)  03/19/21 200 lb 3.2 oz (90.8 kg)   Encouraged weight loss  Health Maintenance Due  Topic Date Due   PNEUMOCOCCAL POLYSACCHARIDE VACCINE AGE 31-64 HIGH RISK  Never done   COVID-19 Vaccine (1) Never done   Pneumococcal Vaccine 22-26 Years old (1 - PCV) Never done   Hepatitis C Screening  Never done   TETANUS/TDAP  Never done   FOOT EXAM  04/04/2021    There are no preventive care reminders to display for this patient.  Lab Results  Component Value Date   TSH 0.891 02/28/2020   Lab Results  Component Value Date   WBC 5.4 03/19/2021   HGB 15.7 03/19/2021   HCT 47.3 03/19/2021   MCV 87 03/19/2021   PLT 173 03/19/2021   Lab Results  Component Value Date   NA 142 03/19/2021   K 3.8 03/19/2021   CO2 20 03/19/2021   GLUCOSE 120 (H) 03/19/2021   BUN 9 03/19/2021   CREATININE 1.08 03/19/2021   BILITOT 0.4 03/19/2021   ALKPHOS 78  03/19/2021   AST 17 03/19/2021   ALT 20 03/19/2021   PROT 7.0 03/19/2021   ALBUMIN 4.3 03/19/2021   CALCIUM 9.3 03/19/2021   ANIONGAP 2 (L) 09/23/2020   EGFR 88 03/19/2021   Lab Results  Component Value Date   CHOL 195 03/19/2021   Lab Results  Component Value Date   HDL 26 (L) 03/19/2021   Lab Results  Component Value Date   LDLCALC 126 (H) 03/19/2021   Lab Results  Component Value Date   TRIG 239 (H) 03/19/2021   Lab Results  Component Value Date   CHOLHDL 7.5 (H) 03/19/2021   Lab Results  Component Value Date   HGBA1C 10.1 (H) 03/19/2021      Assessment & Plan:    1. Type 2 diabetes mellitus with diabetic neuropathy, without long-term current use of insulin (HCC) -His Diabetes is not under control due to non compliance. He was advised to check blood glucose tid, record and bring log and meter to follow up appointment. Left a message for Officer Blount with regards to [employer] service work note. -He was advised to check and keep blood glucose log so to evaluate hypoglycemic episodes when experiencing light headedness. - He was advised that his fasting blood glucose goal should be between 80-130 mg/dl, to continue on low carb/non concentrated sweet diet and exercise as tolerated. - gabapentin (NEURONTIN) 300 MG capsule; Take 1 capsule (300 mg total) by mouth at bedtime.  Dispense: 30 capsule; Refill: 1 - POCT Glucose (CBG); Future - HgB A1c; Future - POCT Glucose (CBG)  2. Essential hypertension -His blood pressure is not under control, he was advised that his goal should be less than 130/80.  -Low salt  DASH diet -Take medications regularly on time -Exercise regularly as tolerated -Check blood pressure at least once a week at home or a nearby pharmacy and record -Goal is less than 130/80 and normal blood pressure is 120/80 - amLODipine (NORVASC) 5 MG tablet; TAKE ONE TABLET BY MOUTH EVERY DAY  Dispense: 90 tablet; Refill: 1  3. History of sleep apnea -He  was encouraged to complete [facility] application and follow up with Neurology.     Follow-up: Return in about 7 weeks (around 06/19/2021), or if symptoms worsen or fail to improve.    Travor Royce Jerold Coombe, NP

## 2021-04-29 NOTE — Patient Instructions (Signed)
https://www.diabeteseducator.org/docs/default-source/living-with-diabetes/conquering-the-grocery-store-v1.pdf?sfvrsn=4">  Carbohydrate Counting for Diabetes Mellitus, Adult Carbohydrate counting is a method of keeping track of how many carbohydrates you eat. Eating carbohydrates naturally increases the amount of sugar (glucose) in the blood. Counting how many carbohydrates you eat improves your bloodglucose control, which helps you manage your diabetes. It is important to know how many carbohydrates you can safely have in each meal. This is different for every person. A dietitian can help you make a meal plan and calculate how many carbohydrates you should have at each meal andsnack. What foods contain carbohydrates? Carbohydrates are found in the following foods: Grains, such as breads and cereals. Dried beans and soy products. Starchy vegetables, such as potatoes, peas, and corn. Fruit and fruit juices. Milk and yogurt. Sweets and snack foods, such as cake, cookies, candy, chips, and soft drinks. How do I count carbohydrates in foods? There are two ways to count carbohydrates in food. You can read food labels or learn standard serving sizes of foods. You can use either of the methods or acombination of both. Using the Nutrition Facts label The Nutrition Facts list is included on the labels of almost all packaged foods and beverages in the [place] It includes: The serving size. Information about nutrients in each serving, including the grams (g) of carbohydrate per serving. To use the Nutrition Facts: Decide how many servings you will have. Multiply the number of servings by the number of carbohydrates per serving. The resulting number is the total amount of carbohydrates that you will be having. Learning the standard serving sizes of foods When you eat carbohydrate foods that are not packaged or do not include Nutrition Facts on the label, you need to measure the servings in order to count the  amount of carbohydrates. Measure the foods that you will eat with a food scale or measuring cup, if needed. Decide how many standard-size servings you will eat. Multiply the number of servings by 15. For foods that contain carbohydrates, one serving equals 15 g of carbohydrates. For example, if you eat 2 cups or 10 oz (300 g) of strawberries, you will have eaten 2 servings and 30 g of carbohydrates (2 servings x 15 g = 30 g). For foods that have more than one food mixed, such as soups and casseroles, you must count the carbohydrates in each food that is included. The following list contains standard serving sizes of common carbohydrate-rich foods. Each of these servings has about 15 g of carbohydrates: 1 slice of bread. 1 six-inch (15 cm) tortilla. ? cup or 2 oz (53 g) cooked rice or pasta.  cup or 3 oz (85 g) cooked or canned, drained and rinsed beans or lentils.  cup or 3 oz (85 g) starchy vegetable, such as peas, corn, or squash.  cup or 4 oz (120 g) hot cereal.  cup or 3 oz (85 g) boiled or mashed potatoes, or  or 3 oz (85 g) of a large baked potato.  cup or 4 fl oz (118 mL) fruit juice. 1 cup or 8 fl oz (237 mL) milk. 1 small or 4 oz (106 g) apple.  or 2 oz (63 g) of a medium banana. 1 cup or 5 oz (150 g) strawberries. 3 cups or 1 oz (24 g) popped popcorn. What is an example of carbohydrate counting? To calculate the number of carbohydrates in this sample meal, follow the stepsshown below. Sample meal 3 oz (85 g) chicken breast. ? cup or 4 oz (106 g) brown rice.    cup or 3 oz (85 g) corn. 1 cup or 8 fl oz (237 mL) milk. 1 cup or 5 oz (150 g) strawberries with sugar-free whipped topping. Carbohydrate calculation Identify the foods that contain carbohydrates: Rice. Corn. Milk. Strawberries. Calculate how many servings you have of each food: 2 servings rice. 1 serving corn. 1 serving milk. 1 serving strawberries. Multiply each number of servings by 15 g: 2 servings  rice x 15 g = 30 g. 1 serving corn x 15 g = 15 g. 1 serving milk x 15 g = 15 g. 1 serving strawberries x 15 g = 15 g. Add together all of the amounts to find the total grams of carbohydrates eaten: 30 g + 15 g + 15 g + 15 g = 75 g of carbohydrates total. What are tips for following this plan? Shopping Develop a meal plan and then make a shopping list. Buy fresh and frozen vegetables, fresh and frozen fruit, dairy, eggs, beans, lentils, and whole grains. Look at food labels. Choose foods that have more fiber and less sugar. Avoid processed foods and foods with added sugars. Meal planning Aim to have the same amount of carbohydrates at each meal and for each snack time. Plan to have regular, balanced meals and snacks. Where to find more information American Diabetes Association: www.diabetes.org Centers for Disease Control and Prevention: www.cdc.gov Summary Carbohydrate counting is a method of keeping track of how many carbohydrates you eat. Eating carbohydrates naturally increases the amount of sugar (glucose) in the blood. Counting how many carbohydrates you eat improves your blood glucose control, which helps you manage your diabetes. A dietitian can help you make a meal plan and calculate how many carbohydrates you should have at each meal and snack. This information is not intended to replace advice given to you by your health care provider. Make sure you discuss any questions you have with your healthcare provider. Document Revised: 10/19/2019 Document Reviewed: 10/20/2019 Elsevier Patient Education  2021 Elsevier Inc.  

## 2021-05-06 ENCOUNTER — Other Ambulatory Visit: Payer: Self-pay

## 2021-05-08 ENCOUNTER — Other Ambulatory Visit: Payer: Self-pay

## 2021-05-15 ENCOUNTER — Other Ambulatory Visit: Payer: Self-pay

## 2021-05-15 ENCOUNTER — Ambulatory Visit: Payer: Self-pay | Admitting: Pharmacist

## 2021-05-15 ENCOUNTER — Encounter: Payer: Self-pay | Admitting: Pharmacist

## 2021-05-15 DIAGNOSIS — Z79899 Other long term (current) drug therapy: Secondary | ICD-10-CM

## 2021-05-15 NOTE — Progress Notes (Addendum)
Medication Management Clinic Visit Note  Patient: Luke [name] MRN: [id number] Date of Birth: May 20, 1981 PCP: Caryl Asp, NP    Luke [name] 40 y.o. male presents for a medication management visit today via telephone. Patient was identified by name and date of birth.  There were no vitals taken for this visit.  Patient Information   Past Medical History:  Diagnosis Date   Anemia    Cancer ([place])    colon ca   Cecum mass    Chronic kidney disease    Colonic neoplasm    Diabetes mellitus without complication ([place])    Diverticulosis of large intestine without diverticulitis    GERD (gastroesophageal reflux disease)    Hypertension    Pneumonia 09/07/2016   Sleep apnea    Stomach irritation       Past Surgical History:  Procedure Laterality Date   COLON SURGERY     COLONOSCOPY WITH PROPOFOL N/A 07/28/2018   Procedure: COLONOSCOPY WITH PROPOFOL;  Surgeon: Virgel Manifold, MD;  Location: [facility] ENDOSCOPY;  Service: Endoscopy;  Laterality: N/A;   COLONOSCOPY WITH PROPOFOL N/A 10/18/2019   Procedure: COLONOSCOPY WITH PROPOFOL;  Surgeon: Virgel Manifold, MD;  Location: [facility] ENDOSCOPY;  Service: Endoscopy;  Laterality: N/A;   COLONOSCOPY WITH PROPOFOL N/A 02/17/2021   Procedure: COLONOSCOPY WITH PROPOFOL;  Surgeon: Virgel Manifold, MD;  Location: [facility] ENDOSCOPY;  Service: Endoscopy;  Laterality: N/A;   ESOPHAGOGASTRODUODENOSCOPY (EGD) WITH PROPOFOL N/A 07/28/2018   Procedure: ESOPHAGOGASTRODUODENOSCOPY (EGD) WITH PROPOFOL;  Surgeon: Virgel Manifold, MD;  Location: [facility] ENDOSCOPY;  Service: Endoscopy;  Laterality: N/A;   PARTIAL COLECTOMY Right 08/15/2018   Procedure: RIGHT  COLECTOMY;  Surgeon: Vickie Epley, MD;  Location: [facility] ORS;  Service: General;  Laterality: Right;     Family History  Problem Relation Age of Onset   CAD Maternal Grandmother    Breast cancer Maternal Grandmother    Breast cancer Mother    Hypertension Mother     Hypertension Father    Colon cancer Paternal Uncle    Throat cancer Maternal Grandfather    Diabetes Paternal Grandmother     New Diagnoses (since last visit): none   Lifestyle Diet: Pt states his diet consists of baked foods and he mostly drinks water.  Exercise: Endorses he attempts to exercise 5 times/week at 30 minute intervals.   Social History   Substance and Sexual Activity  Alcohol Use Not Currently   Alcohol/week: 1.0 standard drink   Types: 1 Cans of beer per week   Comment: patient reports he consumes ~2 beers about once per month      Social History   Tobacco Use  Smoking Status Former   Packs/day: 0.10   Types: Cigarettes   Quit date: 03/25/2020   Years since quitting: 1.1  Smokeless Tobacco Never  Tobacco Comments   patient is social smoker/ smokes when he drinks    Health Maintenance/Date Completed  Last ED visit: 08/20/2020 Last Visit to PCP: 04/29/21 Next Visit to PCP: 06/2021 Specialist Visit: 06/2021 - Neurology  Dental Exam: states he is waiting for appointment Eye Exam: 01/31/2021 Prostate Exam: never  Colonoscopy: 02/17/2021 Flu Vaccine: last flu vaccine was in 07/30/2019 Pneumonia Vaccine: none COVID-19 Vaccine: states he has been fully immunized with booster shot.  Shingrix Vaccine: n/a    Health Maintenance  Topic Date Due   PNEUMOCOCCAL POLYSACCHARIDE VACCINE AGE 72-64 HIGH RISK  Never done   COVID-19 Vaccine (1) Never done   Pneumococcal Vaccine 0-64  Years old (1 - PCV) Never done   Hepatitis C Screening  Never done   TETANUS/TDAP  Never done   FOOT EXAM  04/04/2021   INFLUENZA VACCINE  06/02/2021   HEMOGLOBIN A1C  09/19/2021   URINE MICROALBUMIN  12/18/2021   OPHTHALMOLOGY EXAM  01/31/2022   COLONOSCOPY (Pts 45-80yr Insurance coverage will need to be confirmed)  02/17/2022   HIV Screening  Completed   HPV VACCINES  Aged Out   Outpatient Encounter Medications as of 05/15/2021  Medication Sig   amLODipine (NORVASC) 5 MG  tablet TAKE ONE TABLET BY MOUTH EVERY DAY   blood glucose meter kit and supplies KIT Dispense based on patient and insurance preference. Use up to four times daily as directed. (FOR ICD-9 250.00, 250.01).   COMFORT EZ PEN NEEDLES 32G X 4 MM MISC [place] DIRECTED WITH BASAGLAR   gabapentin (NEURONTIN) 300 MG capsule Take 1 capsule (300 mg total) by mouth at bedtime.   Insulin Glargine (BASAGLAR KWIKPEN) 100 UNIT/ML Inject 29 Units into the skin at bedtime.   insulin lispro (HUMALOG) 100 UNIT/ML KwikPen INJECT 0.1 ML (10 UNITS TOTAL) INTO THE SKIN 3 TIMES A DAY BEFORE MEALS   pravastatin (PRAVACHOL) 40 MG tablet Take 2 tablets (80 mg total) by mouth daily.   [DISCONTINUED] amLODipine (NORVASC) 5 MG tablet TAKE ONE TABLET BY MOUTH EVERY DAY   [DISCONTINUED] gabapentin (NEURONTIN) 100 MG capsule Take 2 capsules (200 mg total) by mouth at bedtime. (Patient not taking: Reported on 04/29/2021)   [DISCONTINUED] metFORMIN (GLUCOPHAGE) 850 MG tablet Take 1 tablet (850 mg total) by mouth 2 (two) times daily with a meal.   No facility-administered encounter medications on file as of 05/15/2021.      Assessment and Plan:  Medication Management/Adherence:  Pt endorses he is adherent with medications and admits to missing a dose every now and then. Medication Management Clinic is assisting pt's medication needs at this time.  Type 2 Diabetes Mellitus: Longstanding currently uncontrolled. A1c at last visit 03/19/21 was 10.1%. Goal <7%. Next A1c 06/2021. During visit pt was asked if polydipsia and polyuria have subsided. Pt reports they are better but still experiences excessive urination and excessive thirst sometimes. Pt currently taking Basaglar and Humalog for management. Pt reports BG readings that range from 120 -175 mg/dL at home. States he checks sugars once a day. Reports a low reading "once every blue moon" and states he eats crackers with peanut butter to help elevate his blood glucose. Metformin was  discontinued 12/19/20 by provider due to side effects. Hypertension:  Longstanding currently uncontrolled. BP at PCP visit on 04/29/21 - 130/100 mmHg. Goal <130/80 mmHg. Currently on Amlodipine. Pt does not check blood pressure at home. Encouraged continuing healthy diet and exercise.  Hyperlipidemia:  Currently uncontrolled but progressing. Taking Pravastatin. Lipid panel on 12/18/20: TG 2,834 mg/dL, HDL 13 mg/dL. As of 03/19/21 TG 239 mg/dL, HDL 26 mg/dL, LDL 126 mg/dL.  GERD:  Pt previously on pantoprazole 40 mg tab daily. Endorses he has not taken anything for acid reflux in a long time but didn't address having any symptoms. Should speak with PCP if symptoms flare or if he has questions about taking an alternative OTC. Sleep Apnea:  Per PCP visit on 04/29/21, pt reports gasping for air upon awakening. Pt is able to verbalize upcoming appointment with neurology. Appointment on 06/30/21.   RTC: 1 year  M. CSan MarinoStaley, BOhioPharmD [place] 2023  [facility]   Cosigned:  Keri K. Dicky Doe, PharmD [facility] [place] Operations Coordinator [phone]

## 2021-05-16 IMAGING — CT CT ABD-PEL WO/W CM
2 of 7 series · 11 of 46 positions shown, 12 images · IV contrast (omnipaque)
Comparison: CT scan 08/20/2020

CLINICAL DATA: Gross hematuria.

EXAM:
CT ABDOMEN AND PELVIS WITHOUT AND WITH CONTRAST
TECHNIQUE: Multidetector CT imaging of the abdomen and pelvis was performed
following the standard protocol before and following the bolus
administration of intravenous contrast.
CONTRAST:  150mL OMNIPAQUE IOHEXOL 300 MG/ML  SOLN

[Series 5: cor without without pre 2.00 cor · coronal · non-contrast · 0.76mm/px · 3 of 153 slices shown]
[im 39/153  soft-tissue]
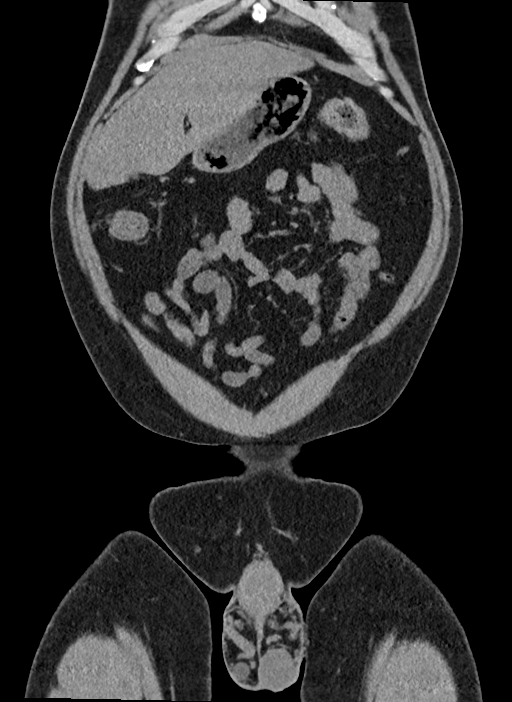
[im 77/153  soft-tissue]
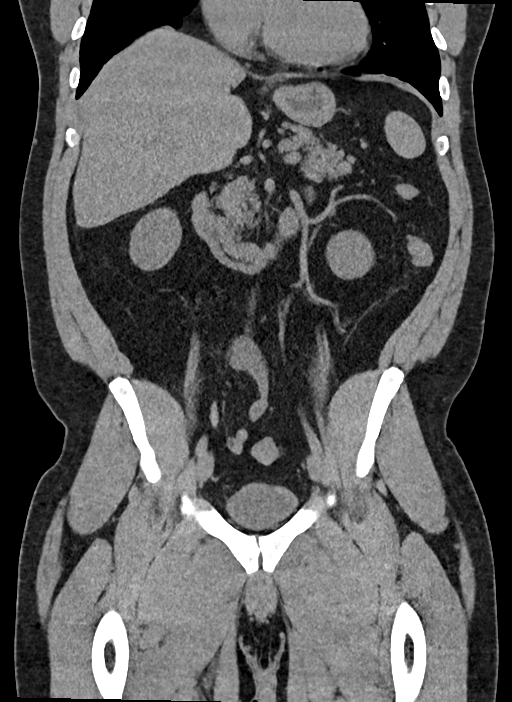
[im 115/153  soft-tissue]
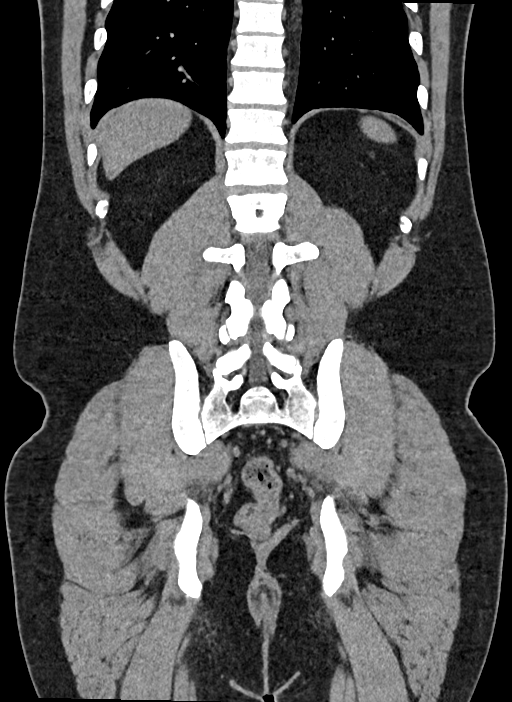

[Series 10: axial delay prone 5.00 ax · axial · delayed · 0.55mm/px · z∈[-1436,-971]mm · 8 of 109 slices shown, 9 images]
[im 8/109  soft-tissue]
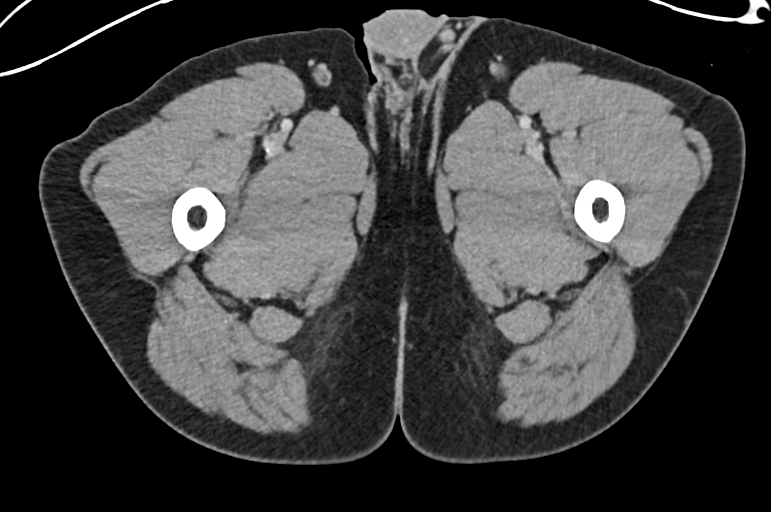
[im 8/109  bone]
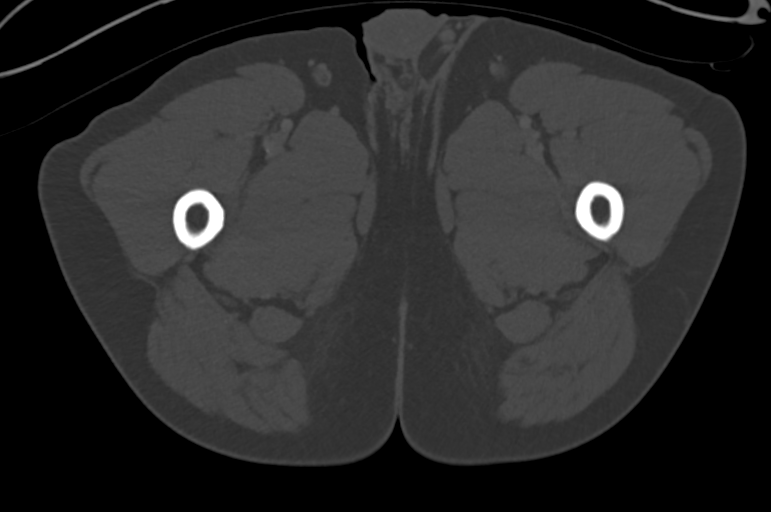
[im 24/109  soft-tissue]
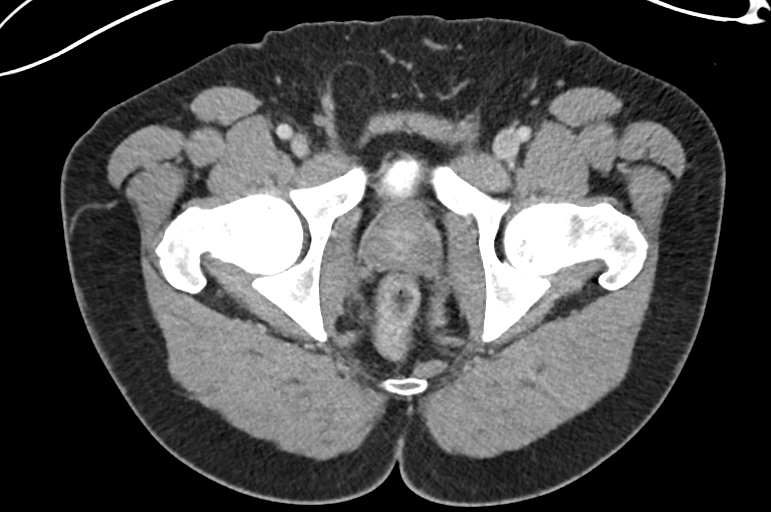
[im 39/109  soft-tissue]
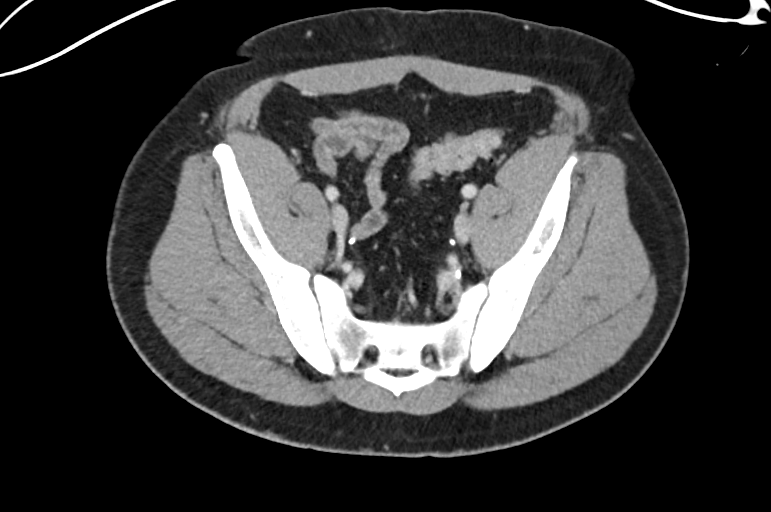
[im 47/109  soft-tissue]
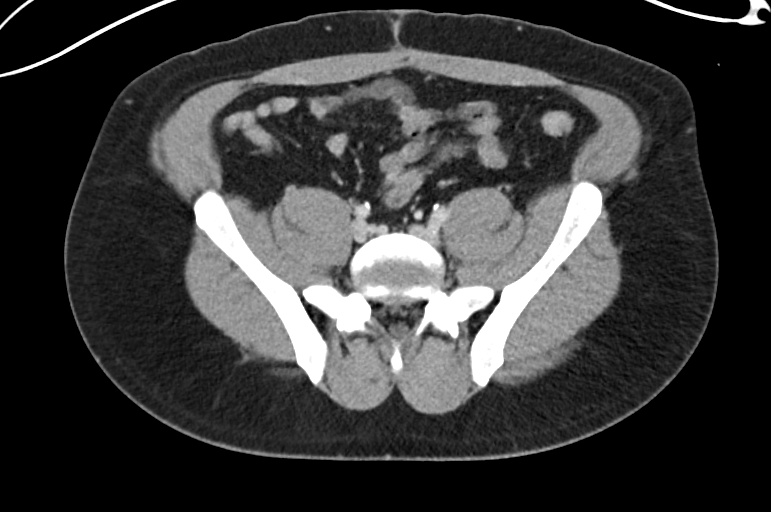
[im 62/109  soft-tissue]
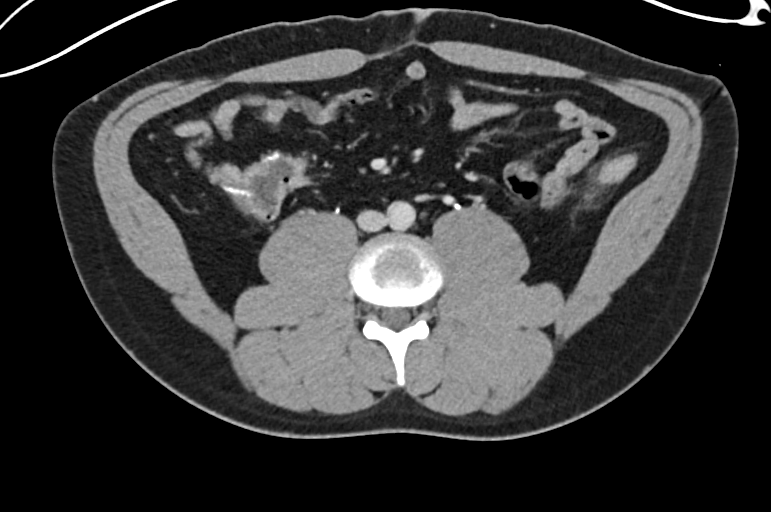
[im 70/109  soft-tissue]
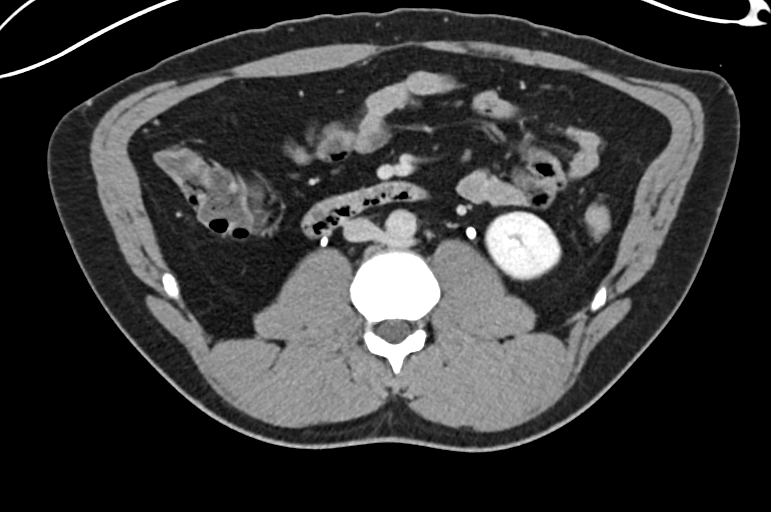
[im 85/109  soft-tissue]
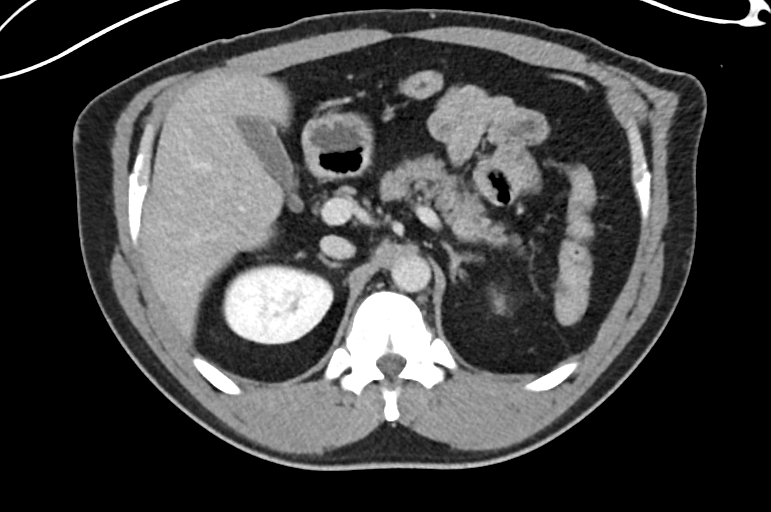
[im 101/109  soft-tissue]
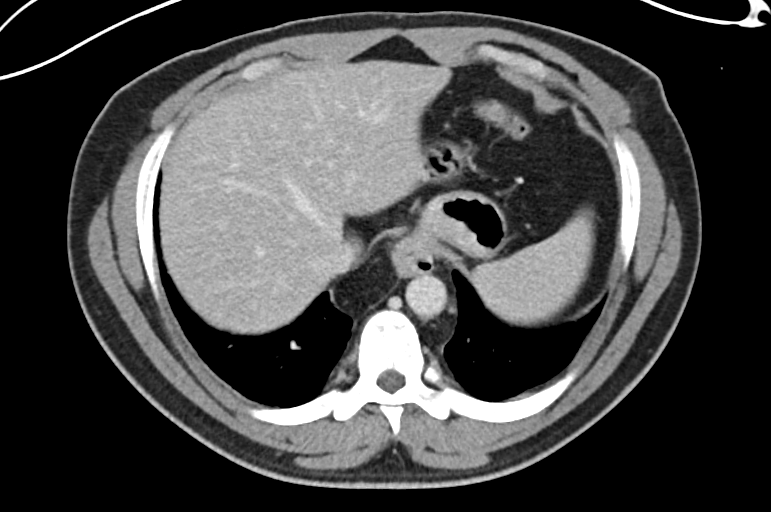

[11 of 46 positions shown; findings below may reference images not displayed]

FINDINGS: Lower chest: The lung bases are clear of acute process. No pleural
effusion or pulmonary lesions. The heart is normal in size. No
pericardial effusion. The distal esophagus and aorta are
unremarkable.

Hepatobiliary: No hepatic lesions or intrahepatic biliary
dilatation. The gallbladder is unremarkable. No common bile duct
dilatation.

Pancreas: No mass, inflammation or ductal dilatation.

Spleen: Normal size. No focal lesions. Small accessory spleen noted.

Adrenals/Urinary Tract: The adrenal glands are normal.

No renal, ureteral or bladder calculi or mass is identified. Both
kidneys demonstrate normal enhancement/perfusion. No collecting
system abnormalities are identified. Both ureters are normal.

No bladder lesions or asymmetric bladder wall thickening. There is a
small urachal remnant noted but I do not see any evidence for
urachal mass.

Stomach/Bowel: The stomach, duodenum, small bowel and colon are
grossly normal without oral contrast. No inflammatory changes, mass
lesions or obstructive findings. There are surgical changes
involving the cecum and ileocecal region. There are probable remote
postinflammatory changes involving the descending colon related to
the severe diverticulitis seen on the prior CT scan. Do not see any
definite findings for acute diverticulitis.

Vascular/Lymphatic: The aorta is normal in caliber. No dissection.
The branch vessels are patent. The major venous structures are
patent. Retroaortic left renal vein noted. No mesenteric or
retroperitoneal mass or adenopathy. Small scattered lymph nodes are
noted.

Reproductive: The prostate gland and seminal vesicles are
unremarkable.

Other: Right inguinal hernia containing fat is noted. No inguinal
adenopathy.

Musculoskeletal: Stable sclerotic lesion involving the
intertrochanteric region of the left femur. Changes of chronic
appearing AVN involving both hips. It is new since 3022.
IMPRESSION: 1. No CT findings to account for the patient's hematuria. No renal,
ureteral or bladder calculi or mass.
2. Small urachal remnant noted but no evidence for urachal mass.
3. Changes of bilateral hip AVN.
4. No acute abdominal/pelvic findings, mass lesions or adenopathy.
5. Right inguinal hernia containing fat.

## 2021-05-27 ENCOUNTER — Other Ambulatory Visit: Payer: Self-pay

## 2021-05-27 ENCOUNTER — Encounter: Payer: Self-pay | Admitting: Hematology and Oncology

## 2021-06-11 ENCOUNTER — Other Ambulatory Visit: Payer: Self-pay

## 2021-06-11 DIAGNOSIS — E1140 Type 2 diabetes mellitus with diabetic neuropathy, unspecified: Secondary | ICD-10-CM

## 2021-06-13 LAB — HEMOGLOBIN A1C
Est. average glucose Bld gHb Est-mCnc: 249 mg/dL
Hgb A1c MFr Bld: 10.3 % — ABNORMAL HIGH (ref 4.8–5.6)

## 2021-06-17 ENCOUNTER — Ambulatory Visit: Payer: Self-pay | Admitting: Endocrinology

## 2021-06-17 ENCOUNTER — Other Ambulatory Visit: Payer: Self-pay

## 2021-06-17 VITALS — BP 142/100 | HR 93 | Temp 98.4°F | Wt 204.0 lb

## 2021-06-17 DIAGNOSIS — Z794 Long term (current) use of insulin: Secondary | ICD-10-CM

## 2021-06-17 DIAGNOSIS — E119 Type 2 diabetes mellitus without complications: Secondary | ICD-10-CM

## 2021-06-17 NOTE — Patient Instructions (Addendum)
I suspect you have type 1 diabetes Get C-peptide and anti-GAD lab tests Come back in 1 month For at least 3 days before you come back, check blood sugar before emals and at bedtime and write it down.  Take Humalog 12 units just before meals and Basaglar 30 units at bedtime.  See if you can learn something about carbohydrates

## 2021-06-17 NOTE — Progress Notes (Signed)
New Diabetes Consultation [facility]     Patient ID: Luke [name], male   DOB: Feb 27, 1981, 40 y.o.   MRN: [id number] Assessment/Plan:  Luke [name] is a 40 y.o. male who is seen in consultation for Type 2 diabetes mellitus without complication, with long-term current use of insulin ([place]) [E11.9, Z79.4] at the request of [place], Chioma E, NP. Luke [name] has periods of measured hyperglycemia with symptoms of hypo and hyperglycemia. Along with high blood glucose readings in the mornings, more regular blood sugars are needed to obtain a clear picture of his diabetes management. He will check his blood sugar more regularly for at least three days before coming back for his follow up appointment in 1 month, with checking four times a day (morning, before lunch, before dinner and before bedtime). Due to the fluctuating glucoses and symptoms resolving after insulin administration, type 1 diabetes is suspected so we ordered a C-peptide and anti-GAD antibody. If C-peptide is normal and anti-GAD antibody is negative, consider adding a GLP-1 receptor agonist. We will increase his Humalog to 12 units three times a day and increase his Basaglar to 30 units before bedtime due to those periods of hyperglycemia. We also counseled about carbohydrate education and researching more about the interaction of carbohydrates and blood sugar. We will see him back in 1 month for follow up.     Patient Instructions  I suspect you have type 1 diabetes Get C-peptide and anti-GAD lab tests Come back in 1 month For at least 3 days before you come back, check blood sugar before emals and at bedtime and write it down.  Take Humalog 12 units just before meals and Basaglar 30 units at bedtime.  See if you can learn something about carbohydrates   Orders Placed This Encounter  Procedures  . C-peptide  . Glutamic acid decarboxylase auto abs   Subjective:  Luke [name] is a 40yo male diagnosed with diabetes in [name] and  is presenting for diabetes management. His most recent hemoglobin A1C of 10.3 on 06/11/21, up from 10.1 on 03/19/21. He checks his blood sugar in the mornings, which range from 100/150-500s. He does not check his blood pressure regularly.  He reports symptoms of hypoglycemia, where he is feeling like he is going to pass out and is dizzy.When these episodes occur, he relaxes and it goes away. Other times during work in his [place] and in 2-3 week spans, he feels really thirsty and has to urinate frequently. He also feels unstable during these times. He also feels "whoozy" and has pain in his hands with a locked up feeling. After eating crackers, this feeling goes away. These symptoms also resolve after taking insulin.  He reports numbness at the tips of his fingers and in both feet, left greater than right foot. He stated his last foot exam was last month, which was consistent with this pattern.  He reports vision changes with blurry vision and a hard time driving at night that resulted in him going to the eye doctor on 01/31/21. No retinopathy was found and he was prescribed glasses, which cleared his vision.   His diet consists of water, sugar free Gatorade and mainly meat (baked chicken, baked fish). He has tried to incorporate veggie burgers and salad. 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  Review of Systems  Constitutional:  Positive for fatigue.  Gastrointestinal:  Positive for abdominal distention, blood in stool and constipation.  He previously had diverticulitis and has blood in his stool during straining. Less than before his colectomy.  Skin:  Negative for rash.  He reports chest pain "sometimes". This occurs with exertion and at rest. It is a sharp pain. He attributes it to acid reflux. These episodes last 5-10 minutes and then stop. He reports shortness of breath sometimes. He reports a cessation of breathing during sleeping, which is being  followed up with in a sleep study scheduled for August 27th.  Luke [name]  has a past medical history of Anemia, Cancer ([place]), Cecum mass, Chronic kidney disease, Colonic neoplasm, Diabetes mellitus without complication ([place]), Diverticulosis of large intestine without diverticulitis, GERD (gastroesophageal reflux disease), Hypertension, Pneumonia (09/07/2016), Sleep apnea, and Stomach irritation.  Luke [name] family history includes Breast cancer in his maternal grandmother and mother; CAD in his maternal grandmother; Colon cancer in his paternal uncle; Diabetes in his paternal grandmother; Hypertension in his father and mother; Throat cancer in his maternal grandfather.  Luke [name] drinks alcohol during "football season" but does not drink regularly. He does not smoke or use other drugs.   Current Outpatient Medications:  .  amLODipine (NORVASC) 5 MG tablet, TAKE ONE TABLET BY MOUTH EVERY DAY, Disp: 90 tablet, Rfl: 1 .  blood glucose meter kit and supplies KIT, Dispense based on patient and insurance preference. Use up to four times daily as directed. (FOR ICD-9 250.00, 250.01)., Disp: 1 each, Rfl: 0 .  COMFORT EZ PEN NEEDLES 32G X 4 MM MISC, [place] AS DIRECTED WITH BASAGLAR, Disp: 100 each, Rfl: 11 .  gabapentin (NEURONTIN) 300 MG capsule, Take 1 capsule (300 mg total) by mouth at bedtime., Disp: 30 capsule, Rfl: 1 .  Insulin Glargine (BASAGLAR KWIKPEN) 100 UNIT/ML, Inject 29 Units into the skin at bedtime., Disp: 15 mL, Rfl: 3 .  insulin lispro (HUMALOG) 100 UNIT/ML KwikPen, INJECT 0.1 ML (10 UNITS TOTAL) INTO THE SKIN 3 TIMES A DAY BEFORE MEALS, Disp: 15 mL, Rfl: 5 .  pravastatin (PRAVACHOL) 40 MG tablet, Take 2 tablets (80 mg total) by mouth daily., Disp: 60 tablet, Rfl: 2 He stopped metformin due to diarrhea. No Known Allergies Objective:    Physical Exam Constitutional:      Appearance: Normal appearance.  Cardiovascular:     Rate and Rhythm: Normal rate and regular rhythm.      Pulses:          Dorsalis pedis pulses are 2+ on the right side and 2+ on the left side.       Posterior tibial pulses are 2+ on the right side and 2+ on the left side.     Heart sounds: Normal heart sounds.  Pulmonary:     Effort: Pulmonary effort is normal.     Breath sounds: Normal breath sounds. No stridor. No wheezing, rhonchi or rales.  Abdominal:     General: Abdomen is flat. Bowel sounds are normal.     Palpations: Abdomen is soft. There is no mass.     Tenderness: There is abdominal tenderness.  Feet:     Comments: Slight swelling in his bilateral lower extremities and no foot deformities. Skin:    General: Skin is warm and dry.  Neurological:     Mental Status: He is alert.  Blood Glucose was 430 on fingerstick. An acetone smell to his breath was present.  Data/Results/Labs  : I have personally reviewed pertinent labs and imaging studies, if indicated,  with the patient in clinic today.  Results for orders placed or performed in visit on 06/11/21 (from the past 672 hour(s))  HgB A1c   Collection Time: 06/11/21 11:01 AM  Result Value Ref Range   Hgb A1c MFr Bld 10.3 (H) 4.8 - 5.6 %   Est. average glucose Bld gHb Est-mCnc 249 mg/dL  ]  HC Readings from Last 3 Encounters:  No data found for [facility]    Wt Readings from Last 3 Encounters:  06/17/21 204 lb (92.5 kg)  06/11/21 206 lb 9.6 oz (93.7 kg)  04/29/21 205 lb 6.4 oz (93.2 kg)

## 2021-06-18 ENCOUNTER — Encounter: Payer: Self-pay | Admitting: Gerontology

## 2021-06-18 ENCOUNTER — Other Ambulatory Visit: Payer: Self-pay

## 2021-06-18 ENCOUNTER — Encounter: Payer: Self-pay | Admitting: Hematology and Oncology

## 2021-06-18 ENCOUNTER — Ambulatory Visit: Payer: Self-pay | Admitting: Gerontology

## 2021-06-18 ENCOUNTER — Other Ambulatory Visit: Payer: Self-pay | Admitting: Gerontology

## 2021-06-18 VITALS — BP 126/90 | HR 82 | Temp 97.3°F | Resp 16 | Ht 68.0 in | Wt 204.0 lb

## 2021-06-18 DIAGNOSIS — E1140 Type 2 diabetes mellitus with diabetic neuropathy, unspecified: Secondary | ICD-10-CM

## 2021-06-18 DIAGNOSIS — Z794 Long term (current) use of insulin: Secondary | ICD-10-CM

## 2021-06-18 DIAGNOSIS — E1169 Type 2 diabetes mellitus with other specified complication: Secondary | ICD-10-CM

## 2021-06-18 DIAGNOSIS — E785 Hyperlipidemia, unspecified: Secondary | ICD-10-CM

## 2021-06-18 DIAGNOSIS — E119 Type 2 diabetes mellitus without complications: Secondary | ICD-10-CM

## 2021-06-18 MED ORDER — PRAVASTATIN SODIUM 40 MG PO TABS
80.0000 mg | ORAL_TABLET | Freq: Every day | ORAL | 2 refills | Status: DC
  Filled 2021-06-18 – 2021-07-14 (×2): qty 60, 30d supply, fill #0
  Filled 2021-09-08: qty 60, 30d supply, fill #1
  Filled 2021-10-31: qty 60, 30d supply, fill #2

## 2021-06-18 MED ORDER — GABAPENTIN 300 MG PO CAPS
300.0000 mg | ORAL_CAPSULE | Freq: Every day | ORAL | 2 refills | Status: DC
  Filled 2021-06-18 – 2021-06-27 (×2): qty 30, 30d supply, fill #0
  Filled 2021-07-28: qty 30, 30d supply, fill #1
  Filled 2021-08-25: qty 30, 30d supply, fill #2

## 2021-06-18 MED ORDER — BASAGLAR KWIKPEN 100 UNIT/ML ~~LOC~~ SOPN
30.0000 [IU] | PEN_INJECTOR | Freq: Every day | SUBCUTANEOUS | 3 refills | Status: DC
  Filled 2021-06-18: qty 15, 50d supply, fill #0
  Filled 2021-10-06: qty 15, 50d supply, fill #1
  Filled 2021-12-01: qty 15, 50d supply, fill #2
  Filled 2022-01-27: qty 15, 50d supply, fill #3

## 2021-06-18 NOTE — Patient Instructions (Signed)
https://www.diabeteseducator.org/docs/default-source/living-with-diabetes/conquering-the-grocery-store-v1.pdf?sfvrsn=4">  Carbohydrate Counting for Diabetes Mellitus, Adult Carbohydrate counting is a method of keeping track of how many carbohydrates you eat. Eating carbohydrates naturally increases the amount of sugar (glucose) in the blood. Counting how many carbohydrates you eat improves your bloodglucose control, which helps you manage your diabetes. It is important to know how many carbohydrates you can safely have in each meal. This is different for every person. A dietitian can help you make a meal plan and calculate how many carbohydrates you should have at each meal andsnack. What foods contain carbohydrates? Carbohydrates are found in the following foods: Grains, such as breads and cereals. Dried beans and soy products. Starchy vegetables, such as potatoes, peas, and corn. Fruit and fruit juices. Milk and yogurt. Sweets and snack foods, such as cake, cookies, candy, chips, and soft drinks. How do I count carbohydrates in foods? There are two ways to count carbohydrates in food. You can read food labels or learn standard serving sizes of foods. You can use either of the methods or acombination of both. Using the Nutrition Facts label The Nutrition Facts list is included on the labels of almost all packaged foods and beverages in the [place] It includes: The serving size. Information about nutrients in each serving, including the grams (g) of carbohydrate per serving. To use the Nutrition Facts: Decide how many servings you will have. Multiply the number of servings by the number of carbohydrates per serving. The resulting number is the total amount of carbohydrates that you will be having. Learning the standard serving sizes of foods When you eat carbohydrate foods that are not packaged or do not include Nutrition Facts on the label, you need to measure the servings in order to count the  amount of carbohydrates. Measure the foods that you will eat with a food scale or measuring cup, if needed. Decide how many standard-size servings you will eat. Multiply the number of servings by 15. For foods that contain carbohydrates, one serving equals 15 g of carbohydrates. For example, if you eat 2 cups or 10 oz (300 g) of strawberries, you will have eaten 2 servings and 30 g of carbohydrates (2 servings x 15 g = 30 g). For foods that have more than one food mixed, such as soups and casseroles, you must count the carbohydrates in each food that is included. The following list contains standard serving sizes of common carbohydrate-rich foods. Each of these servings has about 15 g of carbohydrates: 1 slice of bread. 1 six-inch (15 cm) tortilla. ? cup or 2 oz (53 g) cooked rice or pasta.  cup or 3 oz (85 g) cooked or canned, drained and rinsed beans or lentils.  cup or 3 oz (85 g) starchy vegetable, such as peas, corn, or squash.  cup or 4 oz (120 g) hot cereal.  cup or 3 oz (85 g) boiled or mashed potatoes, or  or 3 oz (85 g) of a large baked potato.  cup or 4 fl oz (118 mL) fruit juice. 1 cup or 8 fl oz (237 mL) milk. 1 small or 4 oz (106 g) apple.  or 2 oz (63 g) of a medium banana. 1 cup or 5 oz (150 g) strawberries. 3 cups or 1 oz (24 g) popped popcorn. What is an example of carbohydrate counting? To calculate the number of carbohydrates in this sample meal, follow the stepsshown below. Sample meal 3 oz (85 g) chicken breast. ? cup or 4 oz (106 g) brown rice.    cup or 3 oz (85 g) corn. 1 cup or 8 fl oz (237 mL) milk. 1 cup or 5 oz (150 g) strawberries with sugar-free whipped topping. Carbohydrate calculation Identify the foods that contain carbohydrates: Rice. Corn. Milk. Strawberries. Calculate how many servings you have of each food: 2 servings rice. 1 serving corn. 1 serving milk. 1 serving strawberries. Multiply each number of servings by 15 g: 2 servings  rice x 15 g = 30 g. 1 serving corn x 15 g = 15 g. 1 serving milk x 15 g = 15 g. 1 serving strawberries x 15 g = 15 g. Add together all of the amounts to find the total grams of carbohydrates eaten: 30 g + 15 g + 15 g + 15 g = 75 g of carbohydrates total. What are tips for following this plan? Shopping Develop a meal plan and then make a shopping list. Buy fresh and frozen vegetables, fresh and frozen fruit, dairy, eggs, beans, lentils, and whole grains. Look at food labels. Choose foods that have more fiber and less sugar. Avoid processed foods and foods with added sugars. Meal planning Aim to have the same amount of carbohydrates at each meal and for each snack time. Plan to have regular, balanced meals and snacks. Where to find more information American Diabetes Association: www.diabetes.org Centers for Disease Control and Prevention: www.cdc.gov Summary Carbohydrate counting is a method of keeping track of how many carbohydrates you eat. Eating carbohydrates naturally increases the amount of sugar (glucose) in the blood. Counting how many carbohydrates you eat improves your blood glucose control, which helps you manage your diabetes. A dietitian can help you make a meal plan and calculate how many carbohydrates you should have at each meal and snack. This information is not intended to replace advice given to you by your health care provider. Make sure you discuss any questions you have with your healthcare provider. Document Revised: 10/19/2019 Document Reviewed: 10/20/2019 Elsevier Patient Education  2021 Elsevier Inc.  

## 2021-06-18 NOTE — Progress Notes (Signed)
Established Patient Office Visit  Subjective:  Patient ID: Luke [name], male    DOB: [birth year]  Age: 40 y.o. MRN: [id number]  CC:  Chief Complaint  Patient presents with   Follow-up   Diabetes    Patient was seen at Endocrinology clinic last night (06/17/21). He states they adjusted his insulin and states he feels better this morning.    HPI Luke [name]  is a 40 y/o male who has history of T2DM, Hypertension, Anemia, Cecum mass, CKD, GERD, Sleep apnea, presents for follow up of T2DM.presents for routine follow up visit. His HgbA1c done on 06/11/21 increased from 10.1% to 10.3%. He checks his blood glucose at least daily, and he reports taking his meal time insulin tid. He takes 30 units of Galrgine at bedtime and his meal time insulin was increased to 12 units of Lispro during his Endocrinology visit yesterday. He states that his fasting blood glucose this morning was 130 mg/dl, but it was 276 mg/dl when checked during visit and he has not had any breakfast. He reports hyperglycemic symptoms, reports that his peripheral neuropathy is improved with taking gabapentin and he performs daily foot checks. Overall, he states that he's doing well and offers no further complaint.    Past Medical History:  Diagnosis Date   Anemia    Cancer ([place])    colon ca   Cecum mass    Chronic kidney disease    Colonic neoplasm    Diabetes mellitus without complication ([place])    Diverticulosis of large intestine without diverticulitis    GERD (gastroesophageal reflux disease)    Hypertension    Pneumonia 09/07/2016   Sleep apnea    Stomach irritation     Past Surgical History:  Procedure Laterality Date   COLON SURGERY     COLONOSCOPY WITH PROPOFOL N/A 07/28/2018   Procedure: COLONOSCOPY WITH PROPOFOL;  Surgeon: Virgel Manifold, MD;  Location: [facility] ENDOSCOPY;  Service: Endoscopy;  Laterality: N/A;   COLONOSCOPY WITH PROPOFOL N/A 10/18/2019   Procedure: COLONOSCOPY WITH PROPOFOL;   Surgeon: Virgel Manifold, MD;  Location: [facility] ENDOSCOPY;  Service: Endoscopy;  Laterality: N/A;   COLONOSCOPY WITH PROPOFOL N/A 02/17/2021   Procedure: COLONOSCOPY WITH PROPOFOL;  Surgeon: Virgel Manifold, MD;  Location: [facility] ENDOSCOPY;  Service: Endoscopy;  Laterality: N/A;   ESOPHAGOGASTRODUODENOSCOPY (EGD) WITH PROPOFOL N/A 07/28/2018   Procedure: ESOPHAGOGASTRODUODENOSCOPY (EGD) WITH PROPOFOL;  Surgeon: Virgel Manifold, MD;  Location: [facility] ENDOSCOPY;  Service: Endoscopy;  Laterality: N/A;   PARTIAL COLECTOMY Right 08/15/2018   Procedure: RIGHT  COLECTOMY;  Surgeon: Vickie Epley, MD;  Location: [facility] ORS;  Service: General;  Laterality: Right;    Family History  Problem Relation Age of Onset   CAD Maternal Grandmother    Breast cancer Maternal Grandmother    Breast cancer Mother    Hypertension Mother    Hypertension Father    Colon cancer Paternal Uncle    Throat cancer Maternal Grandfather    Diabetes Paternal Grandmother     Social History   Socioeconomic History   Marital status: Single    Spouse name: Not on file   Number of children: Not on file   Years of education: Not on file   Highest education level: Not on file  Occupational History   Not on file  Tobacco Use   Smoking status: Former    Packs/day: 0.10    Types: Cigarettes    Quit date: 03/25/2020    Years since  quitting: 1.2   Smokeless tobacco: Never   Tobacco comments:    patient is social smoker/ smokes when he drinks   Vaping Use   Vaping Use: Never used  Substance and Sexual Activity   Alcohol use: Not Currently    Comment: patient reports he consumes ~2 beers about once per month   Drug use: Not Currently    Types: Marijuana, Cocaine    Comment: last use 2020   Sexual activity: Yes    Birth control/protection: Condom  Other Topics Concern   Not on file  Social History Narrative   Not on file   Social Determinants of Health   Financial Resource Strain: Not on file  Food  Insecurity: No Food Insecurity   Worried About Running Out of Food in the Last Year: Never true   Ran Out of Food in the Last Year: Never true  Transportation Needs: No Transportation Needs   Lack of Transportation (Medical): No   Lack of Transportation (Non-Medical): No  Physical Activity: Not on file  Stress: Not on file  Social Connections: Not on file  Intimate Partner Violence: Not on file    Outpatient Medications Prior to Visit  Medication Sig Dispense Refill   amLODipine (NORVASC) 5 MG tablet TAKE ONE TABLET BY MOUTH EVERY DAY 90 tablet 1   blood glucose meter kit and supplies KIT Dispense based on patient and insurance preference. Use up to four times daily as directed. (FOR ICD-9 250.00, 250.01). 1 each 0   COMFORT EZ PEN NEEDLES 32G X 4 MM MISC [place] AS DIRECTED WITH BASAGLAR 100 each 11   insulin lispro (HUMALOG) 100 UNIT/ML KwikPen INJECT 0.1 ML (10 UNITS TOTAL) INTO THE SKIN 3 TIMES A DAY BEFORE MEALS 15 mL 5   gabapentin (NEURONTIN) 300 MG capsule Take 1 capsule (300 mg total) by mouth at bedtime. 30 capsule 1   Insulin Glargine (BASAGLAR KWIKPEN) 100 UNIT/ML Inject 29 Units into the skin at bedtime. 15 mL 3   pravastatin (PRAVACHOL) 40 MG tablet Take 2 tablets (80 mg total) by mouth daily. 60 tablet 2   No facility-administered medications prior to visit.    No Known Allergies  ROS Review of Systems  Constitutional: Negative.   Eyes: Negative.   Respiratory: Negative.    Cardiovascular: Negative.   Endocrine: Positive for polydipsia, polyphagia and polyuria.  Neurological: Negative.      Objective:    Physical Exam HENT:     Head: Normocephalic and atraumatic.     Mouth/Throat:     Mouth: Mucous membranes are moist.  Eyes:     Extraocular Movements: Extraocular movements intact.     Conjunctiva/sclera: Conjunctivae normal.     Pupils: Pupils are equal, round, and reactive to light.  Cardiovascular:     Rate and Rhythm: Normal rate and regular rhythm.      Pulses: Normal pulses.     Heart sounds: Normal heart sounds.  Pulmonary:     Effort: Pulmonary effort is normal.     Breath sounds: Normal breath sounds.  Skin:    General: Skin is warm.  Neurological:     General: No focal deficit present.     Mental Status: He is alert and oriented to person, place, and time. Mental status is at baseline.  Psychiatric:        Mood and Affect: Mood normal.        Behavior: Behavior normal.        Thought Content: Thought content normal.  Judgment: Judgment normal.    BP 126/90 (BP Location: Left Arm, Patient Position: Sitting, Cuff Size: Large)   Pulse 82   Temp (!) 97.3 F (36.3 C)   Resp 16   Ht 5' 8" (1.727 m)   Wt 204 lb (92.5 kg)   SpO2 97%   BMI 31.02 kg/m  Wt Readings from Last 3 Encounters:  06/18/21 204 lb (92.5 kg)  06/17/21 204 lb (92.5 kg)  06/11/21 206 lb 9.6 oz (93.7 kg)   Encouraged weight loss  Health Maintenance Due  Topic Date Due   PNEUMOCOCCAL POLYSACCHARIDE VACCINE AGE 56-64 HIGH RISK  Never done   COVID-19 Vaccine (1) Never done   Pneumococcal Vaccine 46-74 Years old (1 - PCV) Never done   Hepatitis C Screening  Never done   TETANUS/TDAP  Never done   FOOT EXAM  04/04/2021   INFLUENZA VACCINE  06/02/2021    There are no preventive care reminders to display for this patient.  Lab Results  Component Value Date   TSH 0.891 02/28/2020   Lab Results  Component Value Date   WBC 5.4 03/19/2021   HGB 15.7 03/19/2021   HCT 47.3 03/19/2021   MCV 87 03/19/2021   PLT 173 03/19/2021   Lab Results  Component Value Date   NA 142 03/19/2021   K 3.8 03/19/2021   CO2 20 03/19/2021   GLUCOSE 120 (H) 03/19/2021   BUN 9 03/19/2021   CREATININE 1.08 03/19/2021   BILITOT 0.4 03/19/2021   ALKPHOS 78 03/19/2021   AST 17 03/19/2021   ALT 20 03/19/2021   PROT 7.0 03/19/2021   ALBUMIN 4.3 03/19/2021   CALCIUM 9.3 03/19/2021   ANIONGAP 2 (L) 09/23/2020   EGFR 88 03/19/2021   Lab Results  Component Value  Date   CHOL 195 03/19/2021   Lab Results  Component Value Date   HDL 26 (L) 03/19/2021   Lab Results  Component Value Date   LDLCALC 126 (H) 03/19/2021   Lab Results  Component Value Date   TRIG 239 (H) 03/19/2021   Lab Results  Component Value Date   CHOLHDL 7.5 (H) 03/19/2021   Lab Results  Component Value Date   HGBA1C 10.3 (H) 06/11/2021      Assessment & Plan:   1. Type 2 diabetes mellitus with diabetic neuropathy, without long-term current use of insulin ([facility]) -His HgbA1c was 10.3% and his goal should be less than 7%. His Diabetes is not under control due to non compliance. He was educated on the complications of uncontrolled diabetes and he verbalized understanding. He will continue on current medication, low carb/non concentrated sweet diet and exercise as tolerated. He will follow up with [facility] on 07/22/21. - gabapentin (NEURONTIN) 300 MG capsule; Take 1 capsule (300 mg total) by mouth once daily at bedtime.  Dispense: 30 capsule; Refill: 2 - Insulin Glargine (BASAGLAR KWIKPEN) 100 UNIT/ML; Inject 30 Units into the skin at once daily at bedtime.  Dispense: 15 mL; Refill: 3 - POCT Glucose (CBG); Future - HgB A1c; Future  2. Hyperlipidemia associated with type 2 diabetes mellitus ([place]) -He will continue on current medication, low-fat/cholesterol diet and exercise as tolerated. - pravastatin (PRAVACHOL) 40 MG tablet; Take 2 tablets (80 mg total) by mouth once daily.  Dispense: 60 tablet; Refill: 2     Follow-up: Return in about 13 weeks (around 09/17/2021).    Chioma Jerold [place], NP

## 2021-06-19 ENCOUNTER — Ambulatory Visit: Payer: Self-pay | Admitting: Gerontology

## 2021-06-19 LAB — C-PEPTIDE: C-Peptide: 2.9 ng/mL (ref 1.1–4.4)

## 2021-06-19 LAB — HEMOGLOBIN A1C
Est. average glucose Bld gHb Est-mCnc: 269 mg/dL
Hgb A1c MFr Bld: 11 % — ABNORMAL HIGH (ref 4.8–5.6)

## 2021-06-19 LAB — GLUTAMIC ACID DECARBOXYLASE AUTO ABS: Glutamic Acid Decarb Ab: <5 U/mL (ref 0.0–5.0)

## 2021-06-27 ENCOUNTER — Other Ambulatory Visit: Payer: Self-pay

## 2021-06-27 ENCOUNTER — Encounter: Payer: Self-pay | Admitting: Hematology and Oncology

## 2021-07-14 ENCOUNTER — Encounter: Payer: Self-pay | Admitting: Hematology and Oncology

## 2021-07-14 ENCOUNTER — Other Ambulatory Visit: Payer: Self-pay

## 2021-07-21 ENCOUNTER — Encounter: Payer: Self-pay | Admitting: Hematology and Oncology

## 2021-07-22 ENCOUNTER — Ambulatory Visit: Payer: Self-pay

## 2021-07-22 ENCOUNTER — Other Ambulatory Visit: Payer: Self-pay

## 2021-07-28 ENCOUNTER — Encounter: Payer: Self-pay | Admitting: Hematology and Oncology

## 2021-07-28 ENCOUNTER — Other Ambulatory Visit: Payer: Self-pay

## 2021-07-28 MED FILL — Insulin Pen Needle 32 G X 4 MM (1/6" or 5/32"): 25 days supply | Qty: 100 | Fill #2 | Status: AC

## 2021-07-28 MED FILL — Insulin Lispro Soln Pen-injector 100 Unit/ML (1 Unit Dial): SUBCUTANEOUS | 50 days supply | Qty: 15 | Fill #1 | Status: AC

## 2021-08-25 ENCOUNTER — Encounter: Payer: Self-pay | Admitting: Hematology and Oncology

## 2021-08-25 ENCOUNTER — Other Ambulatory Visit: Payer: Self-pay

## 2021-09-01 ENCOUNTER — Ambulatory Visit (INDEPENDENT_AMBULATORY_CARE_PROVIDER_SITE_OTHER): Payer: Self-pay | Admitting: Surgery

## 2021-09-01 ENCOUNTER — Other Ambulatory Visit: Payer: Self-pay

## 2021-09-01 ENCOUNTER — Encounter: Payer: Self-pay | Admitting: Surgery

## 2021-09-01 VITALS — BP 113/78 | HR 80 | Temp 98.7°F | Ht 66.0 in | Wt 196.4 lb

## 2021-09-01 DIAGNOSIS — K4090 Unilateral inguinal hernia, without obstruction or gangrene, not specified as recurrent: Secondary | ICD-10-CM

## 2021-09-01 NOTE — Patient Instructions (Addendum)
Our surgery scheduler will call you within 24-48 hours to schedule your surgery. Please have the Blue surgery sheet available when speaking with her.   Inguinal Hernia, Adult An inguinal hernia develops when fat or the intestines push through a weak spot in a muscle where the leg meets the lower abdomen (groin). This creates a bulge. This kind of hernia could also be: In the scrotum, if you are male. In folds of skin around the vagina, if you are male. There are three types of inguinal hernias: Hernias that can be pushed back into the abdomen (are reducible). This type rarely causes pain. Hernias that are not reducible (are incarcerated). Hernias that are not reducible and lose their blood supply (are strangulated). This type of hernia requires emergency surgery. What are the causes? This condition is caused by having a weak spot in the muscles or tissues in your groin. This develops over time. The hernia may poke through the weak spot when you suddenly strain your lower abdominal muscles, such as when you: Lift a heavy object. Strain to have a bowel movement. Constipation can lead to straining. Cough. What increases the risk? This condition is more likely to develop in: Males. Pregnant females. People who: Are overweight. Work in jobs that require long periods of standing or heavy lifting. Have had an inguinal hernia before. Smoke or have lung disease. These factors can lead to long-term (chronic) coughing. What are the signs or symptoms? Symptoms may depend on the size of the hernia. Often, a small inguinal hernia has no symptoms. Symptoms of a larger hernia may include: A bulge in the groin area. This is easier to see when standing. It might not be visible when lying down. Pain or burning in the groin. This may get worse when lifting, straining, or coughing. A dull ache or a feeling of pressure in the groin. An unusual bulge in the scrotum, in males. Symptoms of a strangulated  inguinal hernia may include: A bulge in your groin that is very painful and tender to the touch. A bulge that turns red or purple. Fever, nausea, and vomiting. Inability to have a bowel movement or to pass gas. How is this diagnosed? This condition is diagnosed based on your symptoms, your medical history, and a physical exam. Your health care provider may feel your groin area and ask you to cough. How is this treated? Treatment depends on the size of your hernia and whether you have symptoms. If you do not have symptoms, your health care provider may have you watch your hernia carefully and have you come in for follow-up visits. If your hernia is large or if you have symptoms, you may need surgery to repair the hernia. Follow these instructions at home: Lifestyle Avoid lifting heavy objects. Avoid standing for long periods of time. Do not use any products that contain nicotine or tobacco. These products include cigarettes, chewing tobacco, and vaping devices, such as e-cigarettes. If you need help quitting, ask your health care provider. Maintain a healthy weight. Preventing constipation You may need to take these actions to prevent or treat constipation: Drink enough fluid to keep your urine pale yellow. Take over-the-counter or prescription medicines. Eat foods that are high in fiber, such as beans, whole grains, and fresh fruits and vegetables. Limit foods that are high in fat and processed sugars, such as fried or sweet foods. General instructions You may try to push the hernia back in place by very gently pressing on it while lying down.   Do not try to force the bulge back in if it will not push in easily. Watch your hernia for any changes in shape, size, or color. Get help right away if you notice any changes. Take over-the-counter and prescription medicines only as told by your health care provider. Keep all follow-up visits. This is important. Contact a health care provider if: You  have a fever or chills. You develop new symptoms. Your symptoms get worse. Get help right away if: You have pain in your groin that suddenly gets worse. You have a bulge in your groin that: Suddenly gets bigger and does not get smaller. Becomes red or purple or painful to the touch. You are a man and you have a sudden pain in your scrotum, or the size of your scrotum suddenly changes. You cannot push the hernia back in place by very gently pressing on it when you are lying down. You have nausea or vomiting that does not go away. You have a fast heartbeat. You cannot have a bowel movement or pass gas. These symptoms may represent a serious problem that is an emergency. Do not wait to see if the symptoms will go away. Get medical help right away. Call your local emergency services (911 in the [place] Summary An inguinal hernia develops when fat or the intestines push through a weak spot in a muscle where your leg meets your lower abdomen (groin). This condition is caused by having a weak spot in muscles or tissues in your groin. Symptoms may depend on the size of the hernia, and they may include pain or swelling in your groin. A small inguinal hernia often has no symptoms. Treatment may not be needed if you do not have symptoms. If you have symptoms or a large hernia, you may need surgery to repair the hernia. Avoid lifting heavy objects. Also, avoid standing for long periods of time. This information is not intended to replace advice given to you by your health care provider. Make sure you discuss any questions you have with your health care provider. Document Revised: 06/18/2020 Document Reviewed: 06/18/2020 Elsevier Patient Education  2022 [employer]  

## 2021-09-02 ENCOUNTER — Telehealth: Payer: Self-pay | Admitting: Surgery

## 2021-09-02 NOTE — Telephone Encounter (Signed)
Patient has been advised of Pre-Admission date/time, COVID Testing date and Surgery date.  Surgery Date: 09/16/21 Preadmission Testing Date: 09/11/21 (phone 8a-1p) Covid Testing Date: Not needed.    Patient has been made aware to call [phone], between 1-3:00pm the day before surgery, to find out what time to arrive for surgery.

## 2021-09-03 NOTE — Progress Notes (Signed)
Outpatient Surgical Follow Up  09/03/2021  Luke [name] is an 40 y.o. male.   Chief Complaint  Patient presents with   Follow-up    Diverticulitis/umbilical hernia     HPI: Zerick is a 40 year old male well-known to me with history of a right colon mass status post right colectomy couple of years ago.  He did develop diverticulitis with abscess. I have recommended elective sigmoid colectomy in the past unfortunately he developed some hematuria that required further work-up.  So far the hematuria has resolved and cystoscopy did not show any explanation for the hematuria.  I will assume that this likely was a consequence of the diverticulitis and potential fistula.  He currently denies any left lower quadrant pain or any other acute exacerbations of diverticulitis however he does report significant symptoms related to Crohn's no right inguinal hernia.  He experiences intermittent pain that is moderate intensity sharp and worsening with Valsalva.  He did have a CT scan that have personally reviewed showing evidence of a right inguinal hernia.  No definitive evidence of ventral hernia.  He endorses pain to the right of the midline  Past Medical History:  Diagnosis Date   Anemia    Cancer ([place])    colon ca   Cecum mass    Chronic kidney disease    Colonic neoplasm    Diabetes mellitus without complication ([place])    Diverticulosis of large intestine without diverticulitis    GERD (gastroesophageal reflux disease)    Hypertension    Pneumonia 09/07/2016   Sleep apnea    Stomach irritation     Past Surgical History:  Procedure Laterality Date   COLON SURGERY     COLONOSCOPY WITH PROPOFOL N/A 07/28/2018   Procedure: COLONOSCOPY WITH PROPOFOL;  Surgeon: Virgel Manifold, MD;  Location: [facility] ENDOSCOPY;  Service: Endoscopy;  Laterality: N/A;   COLONOSCOPY WITH PROPOFOL N/A 10/18/2019   Procedure: COLONOSCOPY WITH PROPOFOL;  Surgeon: Virgel Manifold, MD;  Location: [facility] ENDOSCOPY;   Service: Endoscopy;  Laterality: N/A;   COLONOSCOPY WITH PROPOFOL N/A 02/17/2021   Procedure: COLONOSCOPY WITH PROPOFOL;  Surgeon: Virgel Manifold, MD;  Location: [facility] ENDOSCOPY;  Service: Endoscopy;  Laterality: N/A;   ESOPHAGOGASTRODUODENOSCOPY (EGD) WITH PROPOFOL N/A 07/28/2018   Procedure: ESOPHAGOGASTRODUODENOSCOPY (EGD) WITH PROPOFOL;  Surgeon: Virgel Manifold, MD;  Location: [facility] ENDOSCOPY;  Service: Endoscopy;  Laterality: N/A;   PARTIAL COLECTOMY Right 08/15/2018   Procedure: RIGHT  COLECTOMY;  Surgeon: Vickie Epley, MD;  Location: [facility] ORS;  Service: General;  Laterality: Right;    Family History  Problem Relation Age of Onset   CAD Maternal Grandmother    Breast cancer Maternal Grandmother    Breast cancer Mother    Hypertension Mother    Hypertension Father    Colon cancer Paternal Uncle    Throat cancer Maternal Grandfather    Diabetes Paternal Grandmother     Social History:  reports that he quit smoking about 17 months ago. His smoking use included cigarettes. He smoked an average of .1 packs per day. He has never used smokeless tobacco. He reports that he does not currently use alcohol. He reports that he does not currently use drugs.  Allergies: No Known Allergies  Medications reviewed.    ROS Full ROS performed and is otherwise negative other than what is stated in HPI   BP 113/78   Pulse 80   Temp 98.7 F (37.1 C) (Oral)   Ht 5\' 6"  (1.676 m)  Wt 196 lb 6.4 oz (89.1 kg)   SpO2 96%   BMI 31.70 kg/m   Physical Exam Vitals and nursing note reviewed. Exam conducted with a chaperone present.  Constitutional:      General: He is not in acute distress.    Appearance: Normal appearance. He is not toxic-appearing.  Eyes:     General: No scleral icterus.       Right eye: No discharge.        Left eye: No discharge.  Cardiovascular:     Rate and Rhythm: Normal rate and regular rhythm.     Heart sounds: No murmur heard. Pulmonary:      Effort: Pulmonary effort is normal. No respiratory distress.     Breath sounds: Normal breath sounds. No stridor.  Abdominal:     General: Abdomen is flat. There is no distension.     Palpations: There is no mass.     Tenderness: There is no abdominal tenderness. There is no guarding or rebound.     Hernia: A hernia is present.     Comments: Reducible but tender RIH  Musculoskeletal:        General: No swelling. Normal range of motion.     Cervical back: Normal range of motion and neck supple. No rigidity or tenderness.  Skin:    General: Skin is warm and dry.     Capillary Refill: Capillary refill takes less than 2 seconds.     Coloration: Skin is not jaundiced.  Neurological:     General: No focal deficit present.     Mental Status: He is alert and oriented to person, place, and time.  Psychiatric:        Mood and Affect: Mood normal.        Behavior: Behavior normal.        Thought Content: Thought content normal.        Judgment: Judgment normal.      Assessment/Plan: 40 year old male with symptomatic right inguinal hernia in need for repair.  There is question about the findings of a potential ventral hernia.  Exam is equivocal and CT scan does not necessarily show it.  Discussed with the patient in detail.  I definitely recommend robotic repair of right inguinal hernia possible bilateral.  Also discussed with him that in the event that we were to encounter an incisional hernia that would be amenable for robotic approach I do think that we could perform this at the same time.  Procedure discussed with the patient in detail.  Risks, benefits and possible complications including but not limited to, bleeding, infection, chronic pain, mesh issues, recurrence.  He understands and wishes to proceed.  From the diverticulitis and perspective I do think that we need to do what is more symptomatic and more present at this time that that we will be the right inguinal hernia.   Greater than  50% of the 40 minutes  visit was spent in counseling/coordination of care   Caroleen Hamman, MD [facility]

## 2021-09-04 ENCOUNTER — Encounter: Payer: Self-pay | Admitting: Hematology and Oncology

## 2021-09-08 ENCOUNTER — Encounter: Payer: Self-pay | Admitting: Hematology and Oncology

## 2021-09-08 ENCOUNTER — Other Ambulatory Visit: Payer: Self-pay

## 2021-09-10 ENCOUNTER — Other Ambulatory Visit: Payer: Self-pay

## 2021-09-10 VITALS — BP 133/92 | HR 78 | Temp 98.4°F | Ht 68.0 in | Wt 196.0 lb

## 2021-09-10 DIAGNOSIS — E1140 Type 2 diabetes mellitus with diabetic neuropathy, unspecified: Secondary | ICD-10-CM

## 2021-09-11 ENCOUNTER — Encounter
Admission: RE | Admit: 2021-09-11 | Discharge: 2021-09-11 | Disposition: A | Discharge status: Home / Self Care | Payer: Medicaid Other | Source: Ambulatory Visit | Attending: Surgery | Admitting: Surgery

## 2021-09-11 ENCOUNTER — Other Ambulatory Visit: Payer: Self-pay

## 2021-09-11 VITALS — Ht 68.0 in | Wt 196.0 lb

## 2021-09-11 DIAGNOSIS — E1140 Type 2 diabetes mellitus with diabetic neuropathy, unspecified: Secondary | ICD-10-CM

## 2021-09-11 DIAGNOSIS — I10 Essential (primary) hypertension: Secondary | ICD-10-CM

## 2021-09-11 DIAGNOSIS — D509 Iron deficiency anemia, unspecified: Secondary | ICD-10-CM

## 2021-09-11 DIAGNOSIS — E1165 Type 2 diabetes mellitus with hyperglycemia: Secondary | ICD-10-CM

## 2021-09-11 DIAGNOSIS — Z8669 Personal history of other diseases of the nervous system and sense organs: Secondary | ICD-10-CM

## 2021-09-11 DIAGNOSIS — E785 Hyperlipidemia, unspecified: Secondary | ICD-10-CM

## 2021-09-11 DIAGNOSIS — Z8719 Personal history of other diseases of the digestive system: Secondary | ICD-10-CM

## 2021-09-11 LAB — LIPID PANEL
Chol/HDL Ratio: 7.4 ratio — ABNORMAL HIGH (ref 0.0–5.0)
Cholesterol, Total: 178 mg/dL (ref 100–199)
HDL: 24 mg/dL — ABNORMAL LOW (ref >39)
LDL Chol Calc (NIH): 71 mg/dL (ref 0–99)
Triglycerides: 532 mg/dL — ABNORMAL HIGH (ref 0–149)
VLDL Cholesterol Cal: 83 mg/dL — ABNORMAL HIGH (ref 5–40)

## 2021-09-11 LAB — HEMOGLOBIN A1C
Est. average glucose Bld gHb Est-mCnc: 364 mg/dL
Hgb A1c MFr Bld: 14.3 % — ABNORMAL HIGH (ref 4.8–5.6)

## 2021-09-11 NOTE — Patient Instructions (Addendum)
Your procedure is scheduled on: 09/16/21 Report to [place]. To find out your arrival time please call [phone] between 1PM - 3PM on 09/15/21.  Remember: Instructions that are not followed completely may result in serious medical risk, up to and including death, or upon the discretion of your surgeon and anesthesiologist your surgery may need to be rescheduled.     _X__ 1. Do not eat food after midnight the night before your procedure.                 No gum chewing or hard candies. You may drink clear liquids up to 2 hours                 before you are scheduled to arrive for your surgery- DO not drink clear                 liquids within 2 hours of the start of your surgery.                 Clear Liquids include:  water, apple juice without pulp, clear carbohydrate                 drink such as Clearfast or Gatorade, Black Coffee or Tea (Do not add                 anything to coffee or tea). Diabetics water only  __X__2.  On the morning of surgery brush your teeth with toothpaste and water, you                 may rinse your mouth with mouthwash if you wish.  Do not swallow any              toothpaste of mouthwash.     _X__ 3.  No Alcohol for 24 hours before or after surgery.   _X__ 4.  Do Not Smoke or use e-cigarettes For 24 Hours Prior to Your Surgery.                 Do not use any chewable tobacco products for at least 6 hours prior to                 surgery.  ____  5.  Bring all medications with you on the day of surgery if instructed.   __X__  6.  Notify your doctor if there is any change in your medical condition      (cold, fever, infections).     Do not wear jewelry, make-up, hairpins, clips or nail polish. Do not wear lotions, powders, or perfumes.  Do not shave 48 hours prior to surgery. Men may shave face and neck. Do not bring valuables to the hospital.    [facility] is not responsible for any belongings  or valuables.  Contacts, dentures/partials or body piercings may not be worn into surgery. Bring a case for your contacts, glasses or hearing aids, a denture cup will be supplied. Leave your suitcase in the car. After surgery it may be brought to your room. For patients admitted to the hospital, discharge time is determined by your treatment team.   Patients discharged the day of surgery will not be allowed to drive home.   Please read over the following fact sheets that you were given:   CHG soap  __X__ Take these medicines the morning of surgery with A SIP OF WATER:  1. amLODipine (NORVASC) 5 MG tablet  2. gabapentin (NEURONTIN) 300 MG capsule  3. pravastatin (PRAVACHOL) 40 MG tablet  4.  5.  6.  ____ Fleet Enema (as directed)   __X__ Use CHG Soap/SAGE wipes as directed  ____ Use inhalers on the day of surgery  ____ Stop metformin/Janumet/Farxiga 2 days prior to surgery    __X__ Take 1/2 of usual insulin dose the night before surgery. No insulin the morning          of surgery.   ____ Stop Blood Thinners Coumadin/Plavix/Xarelto/Pleta/Pradaxa/Eliquis/Effient/Aspirin  on   Or contact your Surgeon, Cardiologist or Medical Doctor regarding  ability to stop your blood thinners  __X__ Stop Anti-inflammatories 7 days before surgery such as Advil, Ibuprofen, Motrin,  BC or Goodies Powder, Naprosyn, Naproxen, Aleve, Aspirin    __X__ Stop all herbals and supplements, fish oil and vitamins  until after surgery.    ____ Bring C-Pap to the hospital.

## 2021-09-12 ENCOUNTER — Encounter: Payer: Self-pay | Admitting: Urgent Care

## 2021-09-12 ENCOUNTER — Encounter (HOSPITAL_COMMUNITY): Payer: Self-pay | Admitting: Urgent Care

## 2021-09-12 ENCOUNTER — Other Ambulatory Visit: Payer: Self-pay

## 2021-09-12 ENCOUNTER — Other Ambulatory Visit
Admission: RE | Admit: 2021-09-12 | Discharge: 2021-09-12 | Disposition: A | Discharge status: Home / Self Care | Payer: Medicaid Other | Source: Ambulatory Visit | Attending: Surgery | Admitting: Surgery

## 2021-09-12 DIAGNOSIS — D509 Iron deficiency anemia, unspecified: Secondary | ICD-10-CM

## 2021-09-12 DIAGNOSIS — I10 Essential (primary) hypertension: Secondary | ICD-10-CM

## 2021-09-12 DIAGNOSIS — Z01818 Encounter for other preprocedural examination: Secondary | ICD-10-CM | POA: Insufficient documentation

## 2021-09-12 DIAGNOSIS — Z8669 Personal history of other diseases of the nervous system and sense organs: Secondary | ICD-10-CM

## 2021-09-12 DIAGNOSIS — Z8719 Personal history of other diseases of the digestive system: Secondary | ICD-10-CM

## 2021-09-12 DIAGNOSIS — E1140 Type 2 diabetes mellitus with diabetic neuropathy, unspecified: Secondary | ICD-10-CM

## 2021-09-12 DIAGNOSIS — E1165 Type 2 diabetes mellitus with hyperglycemia: Secondary | ICD-10-CM

## 2021-09-12 DIAGNOSIS — E785 Hyperlipidemia, unspecified: Secondary | ICD-10-CM

## 2021-09-12 DIAGNOSIS — E781 Pure hyperglyceridemia: Secondary | ICD-10-CM | POA: Insufficient documentation

## 2021-09-12 DIAGNOSIS — Z794 Long term (current) use of insulin: Secondary | ICD-10-CM | POA: Insufficient documentation

## 2021-09-12 LAB — CBC
HCT: 47 % (ref 39.0–52.0)
Hemoglobin: 16.1 g/dL (ref 13.0–17.0)
MCH: 29.4 pg (ref 26.0–34.0)
MCHC: 34.3 g/dL (ref 30.0–36.0)
MCV: 85.9 fL (ref 80.0–100.0)
Platelets: 147 10*3/uL — ABNORMAL LOW (ref 150–400)
RBC: 5.47 MIL/uL (ref 4.22–5.81)
RDW: 12.6 % (ref 11.5–15.5)
WBC: 5.6 10*3/uL (ref 4.0–10.5)
nRBC: 0 % (ref 0.0–0.2)

## 2021-09-12 LAB — COMPREHENSIVE METABOLIC PANEL
ALT: 21 U/L (ref 0–44)
AST: 17 U/L (ref 15–41)
Albumin: 4.1 g/dL (ref 3.5–5.0)
Alkaline Phosphatase: 72 U/L (ref 38–126)
Anion gap: 9 (ref 5–15)
BUN: 11 mg/dL (ref 6–20)
CO2: 25 mmol/L (ref 22–32)
Calcium: 9.1 mg/dL (ref 8.9–10.3)
Chloride: 102 mmol/L (ref 98–111)
Creatinine, Ser: 0.94 mg/dL (ref 0.61–1.24)
GFR, Estimated: >60 mL/min (ref >60)
Glucose, Bld: 253 mg/dL — ABNORMAL HIGH (ref 70–99)
Potassium: 3.6 mmol/L (ref 3.5–5.1)
Sodium: 136 mmol/L (ref 135–145)
Total Bilirubin: 0.8 mg/dL (ref 0.3–1.2)
Total Protein: 7.3 g/dL (ref 6.5–8.1)

## 2021-09-12 NOTE — Progress Notes (Signed)
  Perioperative Services: Pre-Admission/Anesthesia Testing  Abnormal Lab Notification    Date: 09/12/21  Name: Luke [name] MRN:   [id number]  Re: Abnormal labs noted during PAT appointment   Provider(s) Notified: Jules Husbands, MD Notification mode: Routed and/or faxed via [place] LAB VALUE(S): Lab Results  Component Value Date   GLUCOSE 253 (H) 09/12/2021   HGBA1C 14.3 (H) 09/10/2021   EAG 364 09/10/2021     Lab Results  Component Value Date   TRIG 532 (H) 09/10/2021   CHOLHDL 7.4 (H) 09/10/2021     Clinical Notes:  Patient with a T2DM diagnosis. He is currently on parenteral therapy using both lispro and glargine insulins. Patient formerly on oral biguanide  (metformin) as well, however this medication was discontinued secondary to associated side effects. Last Hgb A1c was 14.3% on 09/10/2021. Patient also with significantly elevated triglyceride level of 532 mg/dL.   In efforts to reduce his risk of developing SSI, or other potential perioperative complications, this communication is being sent in order to determine if patient is deemed to have adequate medical optimization, including preoperative glycemic control. With that being said, the benefit of improving glycemic control must be weighed against the overall risk associated with delaying a necessary elective surgery for this patient.   Honor Loh, MSN, APRN, FNP-C, CEN [facility]  Peri-operative Services Nurse Practitioner Phone: [phone] 09/12/21 9:40 AM

## 2021-09-15 ENCOUNTER — Encounter: Payer: Self-pay | Admitting: Surgery

## 2021-09-15 ENCOUNTER — Telehealth: Payer: Self-pay | Admitting: Surgery

## 2021-09-15 NOTE — Telephone Encounter (Signed)
Per Dr. Dahlia Byes today, spoke with Dr. Dian Situ in anesthesia and we will need to hold off on patient's surgery for now.  Surgery for 09/16/21 is cancelled.  Per anesthesia patient's diabetes is uncontrolled and getting worse compared to a couple of months ago.  Per Dr. Dahlia Byes patient needs to follow up with his PCP and we will also place referral to endocrinologist.   Patient is called and informed of the above.  He is encouraged to reach out to his PCP as soon as possible.  Also message sent to our clinical staff to place referral to endo and call patient when scheduled.   Patient verbalized understanding with the above.

## 2021-09-16 ENCOUNTER — Encounter: Admission: RE | Payer: Self-pay | Source: Home / Self Care

## 2021-09-16 ENCOUNTER — Telehealth: Payer: Self-pay | Admitting: Surgery

## 2021-09-16 ENCOUNTER — Ambulatory Visit: Admission: RE | Admit: 2021-09-16 | Payer: Self-pay | Source: Home / Self Care | Admitting: Surgery

## 2021-09-16 ENCOUNTER — Ambulatory Visit: Payer: Self-pay

## 2021-09-16 SURGERY — HERNIORRHAPHY, INGUINAL, ROBOT-ASSISTED, LAPAROSCOPIC
Anesthesia: General | Laterality: Right

## 2021-09-16 NOTE — Telephone Encounter (Signed)
Patient is here now and is needing a work note due to him not having surgery due to the surgeons request and the patient is needing a letter stating that. Please advise.

## 2021-09-16 NOTE — Progress Notes (Deleted)
Follow up Diabetes/ Endocrine [facility]     Patient ID: Luke [name], male   DOB: [birth year], 40 y.o.   MRN: [id number] Assessment:  Luke [name] is a 40 y.o. male who is seen in follow up for No primary diagnosis found. at the request of Center, [facility].  Encounter Diagnoses No diagnosis found.  Assessment    Plan:        There are no Patient Instructions on file for this visit.   No orders of the defined types were placed in this encounter.    Subjective:  HPI   Review of Systems  Luke [name]  has a past medical history of Anemia, Cecum mass (08/15/2018), Chronic kidney disease, Colonic neoplasm, Diabetes mellitus without complication ([place]), Diverticulosis of large intestine without diverticulitis, GERD (gastroesophageal reflux disease), Hypertension, Pneumonia (09/07/2016), Sleep apnea, and Stomach irritation.  Family History, Social History, current Medications and allergies reviewed and updated in Epic.  Objective:    There were no vitals taken for this visit. Physical Exam      Data : I have personally reviewed pertinent labs and imaging studies, if indicated,  with the patient in clinic today.   Lab Orders  No laboratory test(s) ordered today    HC Readings from Last 3 Encounters:  No data found for [facility]    Wt Readings from Last 3 Encounters:  09/11/21 196 lb (88.9 kg)  09/10/21 196 lb (88.9 kg)  09/01/21 196 lb 6.4 oz (89.1 kg)

## 2021-09-17 ENCOUNTER — Ambulatory Visit: Payer: Self-pay | Admitting: Gerontology

## 2021-09-17 ENCOUNTER — Other Ambulatory Visit: Payer: Self-pay

## 2021-09-17 ENCOUNTER — Encounter: Payer: Self-pay | Admitting: Gerontology

## 2021-09-17 VITALS — BP 127/88 | HR 76 | Temp 98.1°F | Resp 18 | Ht 68.0 in | Wt 198.2 lb

## 2021-09-17 DIAGNOSIS — E1140 Type 2 diabetes mellitus with diabetic neuropathy, unspecified: Secondary | ICD-10-CM

## 2021-09-17 LAB — GLUCOSE, POCT (MANUAL RESULT ENTRY): POC Glucose: 290 mg/dl — AB (ref 70–99)

## 2021-09-17 MED ORDER — GABAPENTIN 300 MG PO CAPS
300.0000 mg | ORAL_CAPSULE | Freq: Every day | ORAL | 2 refills | Status: DC
  Filled 2021-09-17 – 2021-10-06 (×2): qty 30, 30d supply, fill #0
  Filled 2021-11-05: qty 30, 30d supply, fill #1
  Filled 2021-12-09: qty 30, 30d supply, fill #2

## 2021-09-17 NOTE — Patient Instructions (Signed)

## 2021-09-17 NOTE — Progress Notes (Signed)
Established Patient Office Visit  Subjective:  Patient ID: Luke [name], male    DOB: 1981/03/04  Age: 40 y.o. MRN: [id number]  CC:  Chief Complaint  Patient presents with   Follow-up   Diabetes    Patient did not come for Endocrinology appointment last night    HPI Luke [name]  is a 40 y/o male who has history of T2DM, Hypertension, Anemia, Cecum mass, CKD, GERD, Sleep apnea, presents for follow up of T2DM.presents for routine follow up visit and lab review. His HgbA1c done on 09/10/21 increased from 11% to 14.3%. He states that he checks his blood glucose sometimes twice a day and also  when he feels a "certain way" but does not check it daily. He states that his blood glucose this morning was 300 mg/dl after eating breakfast. His blood glucose was checked during visit and it was 290 mg/dl. He states that he takes 15 units  of Humalog tid with meals and 30 units of Basaglar at night. He admits to experiencing hyperglycemic symptoms. His Triglycerides increased from 239 mg/dl to 532 mg/dl, HDL decreased from 26 to 24 mg/dl. He was seen by Dr Dahlia Byes on 09/01/21 for right inguinal hernia that was seen on CT scan He had a preop visit on 09/12/21 with Honor Loh E NP and his procedure was canceled due to his elevated HgbA1c of 14.3%. Overall, he states that he's doing well and offers no further complaint.  Past Medical History:  Diagnosis Date   Anemia    Cecum mass 08/15/2018   a.) 4.8 cm pedunculated cecal mass; Bx (+) for villous adeoma. b.) s/p RIGHT colectomy.   Chronic kidney disease    Colonic neoplasm    Diabetes mellitus without complication ([place])    Diverticulosis of large intestine without diverticulitis    GERD (gastroesophageal reflux disease)    Hypertension    Pneumonia 09/07/2016   Sleep apnea    Stomach irritation     Past Surgical History:  Procedure Laterality Date   COLON SURGERY     COLONOSCOPY WITH PROPOFOL N/A 07/28/2018   Procedure: COLONOSCOPY WITH  PROPOFOL;  Surgeon: Virgel Manifold, MD;  Location: [facility];  Service: Endoscopy;  Laterality: N/A;   COLONOSCOPY WITH PROPOFOL N/A 10/18/2019   Procedure: COLONOSCOPY WITH PROPOFOL;  Surgeon: Virgel Manifold, MD;  Location: [facility];  Service: Endoscopy;  Laterality: N/A;   COLONOSCOPY WITH PROPOFOL N/A 02/17/2021   Procedure: COLONOSCOPY WITH PROPOFOL;  Surgeon: Virgel Manifold, MD;  Location: [facility];  Service: Endoscopy;  Laterality: N/A;   ESOPHAGOGASTRODUODENOSCOPY (EGD) WITH PROPOFOL N/A 07/28/2018   Procedure: ESOPHAGOGASTRODUODENOSCOPY (EGD) WITH PROPOFOL;  Surgeon: Virgel Manifold, MD;  Location: [facility];  Service: Endoscopy;  Laterality: N/A;   PARTIAL COLECTOMY Right 08/15/2018   Procedure: RIGHT  COLECTOMY;  Surgeon: Vickie Epley, MD;  Location: [facility];  Service: General;  Laterality: Right;    Family History  Problem Relation Age of Onset   CAD Maternal Grandmother    Breast cancer Maternal Grandmother    Breast cancer Mother    Hypertension Mother    Hypertension Father    Colon cancer Paternal Uncle    Throat cancer Maternal Grandfather    Diabetes Paternal Grandmother     Social History   Socioeconomic History   Marital status: Single    Spouse name: Not on file   Number of children: Not on file   Years of education: Not on file  Highest education level: Not on file  Occupational History   Not on file  Tobacco Use   Smoking status: Former    Packs/day: 0.10    Types: Cigarettes    Quit date: 03/25/2020    Years since quitting: 1.4   Smokeless tobacco: Never   Tobacco comments:    patient is social smoker/ smokes when he drinks   Vaping Use   Vaping Use: Never used  Substance and Sexual Activity   Alcohol use: Not Currently    Comment: patient reports he consumes ~2 beers about once per month   Drug use: Not Currently    Types: Marijuana    Comment: last use 2020   Sexual activity: Yes    Birth  control/protection: Condom  Other Topics Concern   Not on file  Social History Narrative   Not on file   Social Determinants of Health   Financial Resource Strain: Not on file  Food Insecurity: No Food Insecurity   Worried About Running Out of Food in the Last Year: Never true   [place] in the Last Year: Never true  Transportation Needs: No Transportation Needs   Lack of Transportation (Medical): No   Lack of Transportation (Non-Medical): No  Physical Activity: Not on file  Stress: Not on file  Social Connections: Not on file  Intimate Partner Violence: Not on file    Outpatient Medications Prior to Visit  Medication Sig Dispense Refill   amLODipine (NORVASC) 5 MG tablet TAKE ONE TABLET BY MOUTH EVERY DAY 90 tablet 1   blood glucose meter kit and supplies KIT Dispense based on patient and insurance preference. Use up to four times daily as directed. (FOR ICD-9 250.00, 250.01). 1 each 0   COMFORT EZ PEN NEEDLES 32G X 4 MM MISC [place] AS DIRECTED WITH BASAGLAR 100 each 11   Insulin Glargine (BASAGLAR KWIKPEN) 100 UNIT/ML Inject 30 Units into the skin at once daily at bedtime. (Patient taking differently: Inject 15-30 Units into the skin See admin instructions. Inject 15 units into the skin with meals and 30 units at bedtime.) 15 mL 3   pravastatin (PRAVACHOL) 40 MG tablet Take 2 tablets (80 mg total) by mouth once daily. 60 tablet 2   gabapentin (NEURONTIN) 300 MG capsule Take 1 capsule (300 mg total) by mouth once daily at bedtime. (Patient taking differently: Take 300 mg by mouth in the morning.) 30 capsule 2   insulin lispro (HUMALOG) 100 UNIT/ML KwikPen INJECT 0.1 ML (10 UNITS TOTAL) INTO THE SKIN 3 TIMES A DAY BEFORE MEALS (Patient not taking: No sig reported) 15 mL 5   No facility-administered medications prior to visit.    No Known Allergies  ROS Review of Systems  Constitutional: Negative.   Eyes: Negative.   Respiratory: Negative.    Cardiovascular: Negative.    Endocrine: Positive for polydipsia, polyphagia and polyuria.  Skin: Negative.   Neurological: Negative.      Objective:    Physical Exam HENT:     Mouth/Throat:     Mouth: Mucous membranes are moist.  Eyes:     Extraocular Movements: Extraocular movements intact.     Conjunctiva/sclera: Conjunctivae normal.     Pupils: Pupils are equal, round, and reactive to light.  Cardiovascular:     Rate and Rhythm: Normal rate and regular rhythm.     Pulses: Normal pulses.     Heart sounds: Normal heart sounds.  Pulmonary:     Effort: Pulmonary effort is normal.  Breath sounds: Normal breath sounds.  Skin:    General: Skin is warm.  Neurological:     General: No focal deficit present.     Mental Status: He is alert and oriented to person, place, and time. Mental status is at baseline.    BP 127/88 (BP Location: Right Arm, Patient Position: Sitting, Cuff Size: Large)   Pulse 76   Temp 98.1 F (36.7 C) (Oral)   Resp 18   Ht _0  (1.727 m)   Wt 198 lb 3.2 oz (89.9 kg)   SpO2 96%   BMI 30.14 kg/m  Wt Readings from Last 3 Encounters:  09/17/21 198 lb 3.2 oz (89.9 kg)  09/11/21 196 lb (88.9 kg)  09/10/21 196 lb (88.9 kg)     Health Maintenance Due  Topic Date Due   COVID-19 Vaccine (1) Never done   Pneumococcal Vaccine 72-80 Years old (1 - PCV) Never done   Hepatitis C Screening  Never done   TETANUS/TDAP  Never done   FOOT EXAM  04/04/2021   INFLUENZA VACCINE  06/02/2021    There are no preventive care reminders to display for this patient.  Lab Results  Component Value Date   TSH 0.891 02/28/2020   Lab Results  Component Value Date   WBC 5.6 09/12/2021   HGB 16.1 09/12/2021   HCT 47.0 09/12/2021   MCV 85.9 09/12/2021   PLT 147 (L) 09/12/2021   Lab Results  Component Value Date   NA 136 09/12/2021   K 3.6 09/12/2021   CO2 25 09/12/2021   GLUCOSE 253 (H) 09/12/2021   BUN 11 09/12/2021   CREATININE 0.94 09/12/2021   BILITOT 0.8 09/12/2021   ALKPHOS 72  09/12/2021   AST 17 09/12/2021   ALT 21 09/12/2021   PROT 7.3 09/12/2021   ALBUMIN 4.1 09/12/2021   CALCIUM 9.1 09/12/2021   ANIONGAP 9 09/12/2021   EGFR 88 03/19/2021   Lab Results  Component Value Date   CHOL 178 09/10/2021   Lab Results  Component Value Date   HDL 24 (L) 09/10/2021   Lab Results  Component Value Date   LDLCALC 71 09/10/2021   Lab Results  Component Value Date   TRIG 532 (H) 09/10/2021   Lab Results  Component Value Date   CHOLHDL 7.4 (H) 09/10/2021   Lab Results  Component Value Date   HGBA1C 14.3 (H) 09/10/2021      Assessment & Plan:   1. Type 2 diabetes mellitus with diabetic neuropathy, without long-term current use of insulin (HCC) -His HgbA1c was 14.3%, his goal should be less than 7%. He was strongly encouraged to check his blood pressure tid, record and bring log to follow up appointment. His fasting blood glucose goal should be between 80-130 mg/dl. He was encouraged to continue on low carb, non concentrated sweet diet and exercise as tolerated. He was provided sliding scale with his Humalog insulin, add 1 unit if BG is 201-250, 2 units BG 251-300, 3 units BG 301-350, 4 units BG 351-400, 5 units BG 401-450, recheck in 2 hours and if greater than 450 to go to the ED. - gabapentin (NEURONTIN) 300 MG capsule; Take 1 capsule (300 mg total) by mouth once daily at bedtime.  Dispense: 30 capsule; Refill: 2 - POCT Glucose (CBG); Future - POCT Glucose (CBG)      Follow-up: Return in about 2 weeks (around 10/01/2021), or if symptoms worsen or fail to improve.    Rafael Quesada Jerold Coombe, NP

## 2021-09-22 ENCOUNTER — Other Ambulatory Visit: Payer: Self-pay

## 2021-09-22 ENCOUNTER — Encounter: Payer: Self-pay | Admitting: Hematology and Oncology

## 2021-09-22 MED FILL — Insulin Pen Needle 32 G X 4 MM (1/6" or 5/32"): 25 days supply | Qty: 100 | Fill #3 | Status: AC

## 2021-10-02 ENCOUNTER — Ambulatory Visit: Payer: Medicaid Other | Admitting: Gerontology

## 2021-10-02 ENCOUNTER — Other Ambulatory Visit: Payer: Self-pay

## 2021-10-06 ENCOUNTER — Other Ambulatory Visit: Payer: Self-pay

## 2021-10-06 ENCOUNTER — Encounter: Payer: Self-pay | Admitting: Hematology and Oncology

## 2021-10-06 MED FILL — Insulin Lispro Soln Pen-injector 100 Unit/ML (1 Unit Dial): SUBCUTANEOUS | 50 days supply | Qty: 15 | Fill #2 | Status: AC

## 2021-10-31 ENCOUNTER — Encounter: Payer: Self-pay | Admitting: Hematology and Oncology

## 2021-10-31 ENCOUNTER — Other Ambulatory Visit: Payer: Self-pay

## 2021-10-31 MED FILL — Insulin Pen Needle 32 G X 4 MM (1/6" or 5/32"): 25 days supply | Qty: 100 | Fill #4 | Status: AC

## 2021-11-05 ENCOUNTER — Encounter: Payer: Self-pay | Admitting: Hematology and Oncology

## 2021-11-05 ENCOUNTER — Other Ambulatory Visit: Payer: Self-pay

## 2021-11-06 ENCOUNTER — Other Ambulatory Visit: Payer: Self-pay

## 2021-12-01 ENCOUNTER — Encounter: Payer: Self-pay | Admitting: Hematology and Oncology

## 2021-12-01 ENCOUNTER — Other Ambulatory Visit: Payer: Self-pay | Admitting: Gerontology

## 2021-12-01 ENCOUNTER — Other Ambulatory Visit: Payer: Self-pay

## 2021-12-01 DIAGNOSIS — E1169 Type 2 diabetes mellitus with other specified complication: Secondary | ICD-10-CM

## 2021-12-01 DIAGNOSIS — I10 Essential (primary) hypertension: Secondary | ICD-10-CM

## 2021-12-01 MED FILL — Insulin Lispro Soln Pen-injector 100 Unit/ML (1 Unit Dial): SUBCUTANEOUS | 50 days supply | Qty: 15 | Fill #3 | Status: AC

## 2021-12-01 MED FILL — Insulin Pen Needle 32 G X 4 MM (1/6" or 5/32"): 25 days supply | Qty: 100 | Fill #5 | Status: AC

## 2021-12-02 ENCOUNTER — Encounter: Payer: Self-pay | Admitting: Hematology and Oncology

## 2021-12-02 ENCOUNTER — Other Ambulatory Visit: Payer: Self-pay

## 2021-12-02 MED FILL — Pravastatin Sodium Tab 40 MG: ORAL | 30 days supply | Qty: 60 | Fill #0 | Status: CN

## 2021-12-02 MED FILL — Amlodipine Besylate Tab 5 MG (Base Equivalent): ORAL | 30 days supply | Qty: 30 | Fill #0 | Status: AC

## 2021-12-08 ENCOUNTER — Other Ambulatory Visit: Payer: Self-pay

## 2021-12-09 ENCOUNTER — Encounter: Payer: Self-pay | Admitting: Hematology and Oncology

## 2021-12-09 ENCOUNTER — Other Ambulatory Visit: Payer: Self-pay

## 2021-12-22 ENCOUNTER — Other Ambulatory Visit: Payer: Self-pay

## 2022-01-06 ENCOUNTER — Other Ambulatory Visit: Payer: Self-pay

## 2022-01-06 ENCOUNTER — Other Ambulatory Visit: Payer: Self-pay | Admitting: Gerontology

## 2022-01-06 ENCOUNTER — Encounter: Payer: Self-pay | Admitting: Hematology and Oncology

## 2022-01-06 DIAGNOSIS — I10 Essential (primary) hypertension: Secondary | ICD-10-CM

## 2022-01-06 DIAGNOSIS — E1140 Type 2 diabetes mellitus with diabetic neuropathy, unspecified: Secondary | ICD-10-CM

## 2022-01-06 MED FILL — Amlodipine Besylate Tab 5 MG (Base Equivalent): ORAL | 30 days supply | Qty: 30 | Fill #0 | Status: AC

## 2022-01-06 MED FILL — Pravastatin Sodium Tab 40 MG: ORAL | 30 days supply | Qty: 60 | Fill #0 | Status: AC

## 2022-01-06 MED FILL — Gabapentin Cap 300 MG: ORAL | 30 days supply | Qty: 30 | Fill #0 | Status: AC

## 2022-01-14 ENCOUNTER — Other Ambulatory Visit: Payer: Self-pay

## 2022-01-14 ENCOUNTER — Ambulatory Visit: Payer: Medicaid Other | Admitting: Gerontology

## 2022-01-14 ENCOUNTER — Encounter: Payer: Self-pay | Admitting: Gerontology

## 2022-01-14 VITALS — BP 134/91 | HR 79 | Temp 98.7°F | Ht 68.0 in | Wt 205.5 lb

## 2022-01-14 DIAGNOSIS — I10 Essential (primary) hypertension: Secondary | ICD-10-CM

## 2022-01-14 DIAGNOSIS — E1140 Type 2 diabetes mellitus with diabetic neuropathy, unspecified: Secondary | ICD-10-CM

## 2022-01-14 DIAGNOSIS — E1169 Type 2 diabetes mellitus with other specified complication: Secondary | ICD-10-CM

## 2022-01-14 LAB — POCT GLYCOSYLATED HEMOGLOBIN (HGB A1C): Hemoglobin A1C: 9.4 % — AB (ref 4.0–5.6)

## 2022-01-14 LAB — GLUCOSE, POCT (MANUAL RESULT ENTRY): POC Glucose: 196 mg/dL — AB (ref 70–99)

## 2022-01-14 MED ORDER — GABAPENTIN 300 MG PO CAPS
300.0000 mg | ORAL_CAPSULE | Freq: Every day | ORAL | 2 refills | Status: DC
  Filled 2022-01-14 – 2022-02-09 (×2): qty 30, 30d supply, fill #0
  Filled 2022-03-12: qty 30, 30d supply, fill #1
  Filled 2022-04-15: qty 30, 30d supply, fill #0

## 2022-01-14 MED ORDER — PRAVASTATIN SODIUM 40 MG PO TABS
80.0000 mg | ORAL_TABLET | Freq: Every day | ORAL | 2 refills | Status: DC
  Filled 2022-01-14 – 2022-04-02 (×3): qty 60, 30d supply, fill #0

## 2022-01-14 MED ORDER — AMLODIPINE BESYLATE 5 MG PO TABS
5.0000 mg | ORAL_TABLET | Freq: Every day | ORAL | 2 refills | Status: DC
  Filled 2022-01-14 – 2022-02-09 (×2): qty 30, 30d supply, fill #0
  Filled 2022-03-12: qty 30, 30d supply, fill #1
  Filled 2022-04-02: qty 30, 30d supply, fill #0

## 2022-01-14 NOTE — Progress Notes (Deleted)
? ?Established Patient Office Visit ? ?Subjective:  ?Patient ID: Luke [name], male    DOB: 10/18/1981  Age: 41 y.o. MRN: [id number] ? ?CC:  ?Chief Complaint  ?Patient presents with  ? Follow-up  ? Diabetes  ?  Sugars have been running in the 100s.   ? ? ?HPI ?Luke [name] presents for *** ? ?Past Medical History:  ?Diagnosis Date  ? Anemia   ? Cecum mass 08/15/2018  ? a.) 4.8 cm pedunculated cecal mass; Bx (+) for villous adeoma. b.) s/p RIGHT colectomy.  ? Chronic kidney disease   ? Colonic neoplasm   ? Diabetes mellitus without complication ([place])   ? Diverticulosis of large intestine without diverticulitis   ? GERD (gastroesophageal reflux disease)   ? Hypertension   ? Pneumonia 09/07/2016  ? Sleep apnea   ? Stomach irritation   ? ? ?Past Surgical History:  ?Procedure Laterality Date  ? COLON SURGERY    ? COLONOSCOPY WITH PROPOFOL N/A 07/28/2018  ? Procedure: COLONOSCOPY WITH PROPOFOL;  Surgeon: Virgel Manifold, MD;  Location: [facility];  Service: Endoscopy;  Laterality: N/A;  ? COLONOSCOPY WITH PROPOFOL N/A 10/18/2019  ? Procedure: COLONOSCOPY WITH PROPOFOL;  Surgeon: Virgel Manifold, MD;  Location: [facility];  Service: Endoscopy;  Laterality: N/A;  ? COLONOSCOPY WITH PROPOFOL N/A 02/17/2021  ? Procedure: COLONOSCOPY WITH PROPOFOL;  Surgeon: Virgel Manifold, MD;  Location: [facility];  Service: Endoscopy;  Laterality: N/A;  ? ESOPHAGOGASTRODUODENOSCOPY (EGD) WITH PROPOFOL N/A 07/28/2018  ? Procedure: ESOPHAGOGASTRODUODENOSCOPY (EGD) WITH PROPOFOL;  Surgeon: Virgel Manifold, MD;  Location: [facility];  Service: Endoscopy;  Laterality: N/A;  ? PARTIAL COLECTOMY Right 08/15/2018  ? Procedure: RIGHT  COLECTOMY;  Surgeon: Vickie Epley, MD;  Location: [facility];  Service: General;  Laterality: Right;  ? ? ?Family History  ?Problem Relation Age of Onset  ? CAD Maternal Grandmother   ? Breast cancer Maternal Grandmother   ? Breast cancer Mother   ? Hypertension Mother   ?  Hypertension Father   ? Colon cancer Paternal Uncle   ? Throat cancer Maternal Grandfather   ? Diabetes Paternal Grandmother   ? ? ?Social History  ? ?Socioeconomic History  ? Marital status: Single  ?  Spouse name: Not on file  ? Number of children: Not on file  ? Years of education: Not on file  ? Highest education level: Not on file  ?Occupational History  ? Not on file  ?Tobacco Use  ? Smoking status: Former  ?  Packs/day: 0.10  ?  Types: Cigarettes  ?  Quit date: 03/25/2020  ?  Years since quitting: 1.8  ? Smokeless tobacco: Never  ? Tobacco comments:  ?  patient is social smoker/ smokes when he drinks   ?Vaping Use  ? Vaping Use: Never used  ?Substance and Sexual Activity  ? Alcohol use: Not Currently  ?  Comment: patient reports he consumes ~2 beers about once per month  ? Drug use: Not Currently  ?  Types: Marijuana  ?  Comment: last use 2020  ? Sexual activity: Yes  ?  Birth control/protection: Condom  ?Other Topics Concern  ? Not on file  ?Social History Narrative  ? Not on file  ? ?Social Determinants of Health  ? ?Financial Resource Strain: Not on file  ?Food Insecurity: No Food Insecurity  ? Worried About Charity fundraiser in the Last Year: Never true  ? Ran Out of Food in the Last  Year: Never true  ?Transportation Needs: No Transportation Needs  ? Lack of Transportation (Medical): No  ? Lack of Transportation (Non-Medical): No  ?Physical Activity: Not on file  ?Stress: Not on file  ?Social Connections: Not on file  ?Intimate Partner Violence: Not on file  ? ? ?Outpatient Medications Prior to Visit  ?Medication Sig Dispense Refill  ? amLODipine (NORVASC) 5 MG tablet TAKE ONE TABLET BY MOUTH ONCE EVERY DAY 30 tablet 0  ? gabapentin (NEURONTIN) 300 MG capsule Take 1 capsule (300 mg total) by mouth once daily at bedtime. 30 capsule 0  ? Insulin Glargine (BASAGLAR KWIKPEN) 100 UNIT/ML Inject 30 Units into the skin at once daily at bedtime. (Patient taking differently: Inject 15-30 Units into the skin See  admin instructions. Inject 15 units into the skin with meals and 30 units at bedtime.) 15 mL 3  ? blood glucose meter kit and supplies KIT Dispense based on patient and insurance preference. Use up to four times daily as directed. (FOR ICD-9 250.00, 250.01). 1 each 0  ? insulin lispro (HUMALOG) 100 UNIT/ML KwikPen INJECT 0.1 ML (10 UNITS TOTAL) INTO THE SKIN 3 TIMES A DAY BEFORE MEALS (Patient not taking: Reported on 09/05/2021) 15 mL 5  ? pravastatin (PRAVACHOL) 40 MG tablet Take 2 tablets (80 mg total) by mouth once daily. 60 tablet 0  ? ?No facility-administered medications prior to visit.  ? ? ?No Known Allergies ? ?ROS ?Review of Systems ? ?  ?Objective:  ?  ?Physical Exam ? ?BP (!) 134/91 (BP Location: Right Arm, Patient Position: Sitting, Cuff Size: Large)   Pulse 79   Temp 98.7 ?F (37.1 ?C)   Ht '5\' 8"'  (1.727 m)   Wt 205 lb 8 oz (93.2 kg)   SpO2 97%   BMI 31.25 kg/m?  ?Wt Readings from Last 3 Encounters:  ?01/14/22 205 lb 8 oz (93.2 kg)  ?09/17/21 198 lb 3.2 oz (89.9 kg)  ?09/11/21 196 lb (88.9 kg)  ? ? ? ?Health Maintenance Due  ?Topic Date Due  ? COVID-19 Vaccine (1) Never done  ? Hepatitis C Screening  Never done  ? TETANUS/TDAP  Never done  ? FOOT EXAM  04/04/2021  ? INFLUENZA VACCINE  06/02/2021  ? URINE MICROALBUMIN  12/18/2021  ? ? ?There are no preventive care reminders to display for this patient. ? ?Lab Results  ?Component Value Date  ? TSH 0.891 02/28/2020  ? ?Lab Results  ?Component Value Date  ? WBC 5.6 09/12/2021  ? HGB 16.1 09/12/2021  ? HCT 47.0 09/12/2021  ? MCV 85.9 09/12/2021  ? PLT 147 (L) 09/12/2021  ? ?Lab Results  ?Component Value Date  ? NA 136 09/12/2021  ? K 3.6 09/12/2021  ? CO2 25 09/12/2021  ? GLUCOSE 253 (H) 09/12/2021  ? BUN 11 09/12/2021  ? CREATININE 0.94 09/12/2021  ? BILITOT 0.8 09/12/2021  ? ALKPHOS 72 09/12/2021  ? AST 17 09/12/2021  ? ALT 21 09/12/2021  ? PROT 7.3 09/12/2021  ? ALBUMIN 4.1 09/12/2021  ? CALCIUM 9.1 09/12/2021  ? ANIONGAP 9 09/12/2021  ? EGFR 88  03/19/2021  ? ?Lab Results  ?Component Value Date  ? CHOL 178 09/10/2021  ? ?Lab Results  ?Component Value Date  ? HDL 24 (L) 09/10/2021  ? ?Lab Results  ?Component Value Date  ? [place] 71 09/10/2021  ? ?Lab Results  ?Component Value Date  ? TRIG 532 (H) 09/10/2021  ? ?Lab Results  ?Component Value Date  ? CHOLHDL 7.4 (H)  09/10/2021  ? ?Lab Results  ?Component Value Date  ? HGBA1C 14.3 (H) 09/10/2021  ? ? ?  ?Assessment & Plan:  ? ?Problem List Items Addressed This Visit   ?None ? ? ?No orders of the defined types were placed in this encounter. ? ? ?Follow-up: No follow-ups on file.  ? ? ?Carmon Brigandi Jerold Coombe, NP ?

## 2022-01-14 NOTE — Progress Notes (Signed)
D[facility] ? ?Established Patient Office Visit ? ?Subjective:  ?Patient ID: Luke [name], male    DOB: 29-Jun-1981  Age: 41 y.o. MRN: [id number] ? ? ?CC:  ?Chief Complaint  ?Patient presents with  ? Follow-up  ? Diabetes  ?  Sugars have been running in the 100s.   ? ? ?HPI ?RONTE [name]  is a 41 y/o male who has history of T2DM, Hypertension, Anemia, Cecum mass, CKD, GERD, Sleep apnea, presents for follow up on T2DM. His HgA1c during visit 01/14/22 decreased from 14.3% to 9.4%.  He stated that he checks his blood  glucose every day but not 3 three times a day and also checks it when he feels down. His blood glucose finger stick  reading during visit was 196 mg /dl. He states that his fasting blood glucose reading this morning was 200 mg/dl. He continues to make healthy lifestyle changes. Denies any symptoms of hypo/hyperglycemia, peripheral neuropathy and perform foot checks at home. Mono filament foot check today was normal. He states that he is doing well generally and denies any other complaint today. ? ? ? ? ? ? ? ? ? ? ?Past Medical History:  ?Diagnosis Date  ? Anemia   ? Cecum mass 08/15/2018  ? a.) 4.8 cm pedunculated cecal mass; Bx (+) for villous adeoma. b.) s/p RIGHT colectomy.  ? Chronic kidney disease   ? Colonic neoplasm   ? Diabetes mellitus without complication ([place])   ? Diverticulosis of large intestine without diverticulitis   ? GERD (gastroesophageal reflux disease)   ? Hypertension   ? Pneumonia 09/07/2016  ? Sleep apnea   ? Stomach irritation   ? ? ?Past Surgical History:  ?Procedure Laterality Date  ? COLON SURGERY    ? COLONOSCOPY WITH PROPOFOL N/A 07/28/2018  ? Procedure: COLONOSCOPY WITH PROPOFOL;  Surgeon: Virgel Manifold, MD;  Location: [facility];  Service: Endoscopy;  Laterality: N/A;  ? COLONOSCOPY WITH PROPOFOL N/A 10/18/2019  ? Procedure: COLONOSCOPY WITH PROPOFOL;  Surgeon: Virgel Manifold, MD;  Location: [facility];  Service: Endoscopy;  Laterality: N/A;  ? COLONOSCOPY WITH  PROPOFOL N/A 02/17/2021  ? Procedure: COLONOSCOPY WITH PROPOFOL;  Surgeon: Virgel Manifold, MD;  Location: [facility];  Service: Endoscopy;  Laterality: N/A;  ? ESOPHAGOGASTRODUODENOSCOPY (EGD) WITH PROPOFOL N/A 07/28/2018  ? Procedure: ESOPHAGOGASTRODUODENOSCOPY (EGD) WITH PROPOFOL;  Surgeon: Virgel Manifold, MD;  Location: [facility];  Service: Endoscopy;  Laterality: N/A;  ? PARTIAL COLECTOMY Right 08/15/2018  ? Procedure: RIGHT  COLECTOMY;  Surgeon: Vickie Epley, MD;  Location: [facility];  Service: General;  Laterality: Right;  ? ? ?Family History  ?Problem Relation Age of Onset  ? CAD Maternal Grandmother   ? Breast cancer Maternal Grandmother   ? Breast cancer Mother   ? Hypertension Mother   ? Hypertension Father   ? Colon cancer Paternal Uncle   ? Throat cancer Maternal Grandfather   ? Diabetes Paternal Grandmother   ? ? ?Social History  ? ?Socioeconomic History  ? Marital status: Single  ?  Spouse name: Not on file  ? Number of children: Not on file  ? Years of education: Not on file  ? Highest education level: Not on file  ?Occupational History  ? Not on file  ?Tobacco Use  ? Smoking status: Former  ?  Packs/day: 0.10  ?  Types: Cigarettes  ?  Quit date: 03/25/2020  ?  Years since quitting: 1.8  ? Smokeless tobacco: Never  ? Tobacco  comments:  ?  patient is social smoker/ smokes when he drinks   ?Vaping Use  ? Vaping Use: Never used  ?Substance and Sexual Activity  ? Alcohol use: Not Currently  ?  Comment: patient reports he consumes ~2 beers about once per month  ? Drug use: Not Currently  ?  Types: Marijuana  ?  Comment: last use 2020  ? Sexual activity: Yes  ?  Birth control/protection: Condom  ?Other Topics Concern  ? Not on file  ?Social History Narrative  ? Not on file  ? ?Social Determinants of Health  ? ?Financial Resource Strain: Not on file  ?Food Insecurity: No Food Insecurity  ? Worried About Charity fundraiser in the Last Year: Never true  ? Ran Out of Food in the Last  Year: Never true  ?Transportation Needs: No Transportation Needs  ? Lack of Transportation (Medical): No  ? Lack of Transportation (Non-Medical): No  ?Physical Activity: Not on file  ?Stress: Not on file  ?Social Connections: Not on file  ?Intimate Partner Violence: Not on file  ? ? ?Outpatient Medications Prior to Visit  ?Medication Sig Dispense Refill  ? Insulin Glargine (BASAGLAR KWIKPEN) 100 UNIT/ML Inject 30 Units into the skin at once daily at bedtime. (Patient taking differently: Inject 15-30 Units into the skin See admin instructions. Inject 15 units into the skin with meals and 30 units at bedtime.) 15 mL 3  ? amLODipine (NORVASC) 5 MG tablet TAKE ONE TABLET BY MOUTH ONCE EVERY DAY 30 tablet 0  ? gabapentin (NEURONTIN) 300 MG capsule Take 1 capsule (300 mg total) by mouth once daily at bedtime. 30 capsule 0  ? blood glucose meter kit and supplies KIT Dispense based on patient and insurance preference. Use up to four times daily as directed. (FOR ICD-9 250.00, 250.01). 1 each 0  ? insulin lispro (HUMALOG) 100 UNIT/ML KwikPen INJECT 0.1 ML (10 UNITS TOTAL) INTO THE SKIN 3 TIMES A DAY BEFORE MEALS (Patient not taking: Reported on 09/05/2021) 15 mL 5  ? pravastatin (PRAVACHOL) 40 MG tablet Take 2 tablets (80 mg total) by mouth once daily. 60 tablet 0  ? ?No facility-administered medications prior to visit.  ? ? ?No Known Allergies ? ?ROS ?Review of Systems  ?Constitutional: Negative.   ?HENT: Negative.    ?Respiratory: Negative.    ?Cardiovascular: Negative.   ?Gastrointestinal: Negative.   ?Endocrine: Negative.   ?Genitourinary: Negative.   ?Skin: Negative.   ?Neurological: Negative.   ?Psychiatric/Behavioral: Negative.    ? ?  ?Objective:  ?  ?Physical Exam ?HENT:  ?   Head: Normocephalic.  ?   Nose: Nose normal.  ?   Mouth/Throat:  ?   Mouth: Mucous membranes are moist.  ?Eyes:  ?   Pupils: Pupils are equal, round, and reactive to light.  ?Cardiovascular:  ?   Rate and Rhythm: Normal rate.  ?   Pulses: Normal  pulses.  ?   Heart sounds: Normal heart sounds.  ?Pulmonary:  ?   Breath sounds: Normal breath sounds.  ?Abdominal:  ?   Palpations: Abdomen is soft.  ?Musculoskeletal:     ?   General: Normal range of motion.  ?   Cervical back: Normal range of motion.  ?Skin: ?   General: Skin is warm and dry.  ?Neurological:  ?   Mental Status: He is alert and oriented to person, place, and time.  ? ? ?BP (!) 134/91 (BP Location: Right Arm, Patient Position: Sitting, Cuff Size:  Large)   Pulse 79   Temp 98.7 ?F (37.1 ?C)   Ht '5\' 8"'  (1.727 m)   Wt 205 lb 8 oz (93.2 kg)   SpO2 97%   BMI 31.25 kg/m?  ?Wt Readings from Last 3 Encounters:  ?01/14/22 205 lb 8 oz (93.2 kg)  ?09/17/21 198 lb 3.2 oz (89.9 kg)  ?09/11/21 196 lb (88.9 kg)  ? ?Encouraged weight loss ? ?Health Maintenance Due  ?Topic Date Due  ? COVID-19 Vaccine (1) Never done  ? Hepatitis C Screening  Never done  ? TETANUS/TDAP  Never done  ? INFLUENZA VACCINE  06/02/2021  ? URINE MICROALBUMIN  12/18/2021  ? ? ?There are no preventive care reminders to display for this patient. ? ?Lab Results  ?Component Value Date  ? TSH 0.891 02/28/2020  ? ?Lab Results  ?Component Value Date  ? WBC 5.6 09/12/2021  ? HGB 16.1 09/12/2021  ? HCT 47.0 09/12/2021  ? MCV 85.9 09/12/2021  ? PLT 147 (L) 09/12/2021  ? ?Lab Results  ?Component Value Date  ? NA 136 09/12/2021  ? K 3.6 09/12/2021  ? CO2 25 09/12/2021  ? GLUCOSE 253 (H) 09/12/2021  ? BUN 11 09/12/2021  ? CREATININE 0.94 09/12/2021  ? BILITOT 0.8 09/12/2021  ? ALKPHOS 72 09/12/2021  ? AST 17 09/12/2021  ? ALT 21 09/12/2021  ? PROT 7.3 09/12/2021  ? ALBUMIN 4.1 09/12/2021  ? CALCIUM 9.1 09/12/2021  ? ANIONGAP 9 09/12/2021  ? EGFR 88 03/19/2021  ? ?Lab Results  ?Component Value Date  ? CHOL 178 09/10/2021  ? ?Lab Results  ?Component Value Date  ? HDL 24 (L) 09/10/2021  ? ?Lab Results  ?Component Value Date  ? [place] 71 09/10/2021  ? ?Lab Results  ?Component Value Date  ? TRIG 532 (H) 09/10/2021  ? ?Lab Results  ?Component Value Date   ? CHOLHDL 7.4 (H) 09/10/2021  ? ?Lab Results  ?Component Value Date  ? HGBA1C 9.4 (A) 01/14/2022  ? ? ?  ?Assessment & Plan:  ? ?Type 2 diabetes mellitus with diabetic neuropathy, without long-term current

## 2022-01-14 NOTE — Patient Instructions (Signed)

## 2022-01-16 LAB — MICROALBUMIN / CREATININE URINE RATIO
Creatinine, Urine: 283.3 mg/dL
Microalb/Creat Ratio: 19 mg/g creat (ref 0–29)
Microalbumin, Urine: 53.1 ug/mL

## 2022-01-27 ENCOUNTER — Encounter: Payer: Self-pay | Admitting: Hematology and Oncology

## 2022-01-27 ENCOUNTER — Other Ambulatory Visit: Payer: Self-pay | Admitting: Gerontology

## 2022-01-27 ENCOUNTER — Other Ambulatory Visit: Payer: Self-pay

## 2022-01-27 MED ORDER — INSULIN PEN NEEDLE 32G X 4 MM MISC
99 refills | Status: AC
  Filled 2022-01-27 – 2022-05-06 (×2): qty 100, 25d supply, fill #0
  Filled 2022-06-10: qty 100, 25d supply, fill #1
  Filled 2022-07-21 – 2022-08-10 (×2): qty 100, 25d supply, fill #2
  Filled 2022-09-28: qty 100, 25d supply, fill #3
  Filled 2022-11-19: qty 100, 25d supply, fill #4
  Filled 2022-12-31 (×2): qty 100, 25d supply, fill #5

## 2022-01-27 MED FILL — Insulin Lispro Soln Pen-injector 100 Unit/ML (1 Unit Dial): SUBCUTANEOUS | 50 days supply | Qty: 15 | Fill #0 | Status: AC

## 2022-02-05 ENCOUNTER — Other Ambulatory Visit: Payer: Self-pay

## 2022-02-09 ENCOUNTER — Encounter: Payer: Self-pay | Admitting: Hematology and Oncology

## 2022-02-09 ENCOUNTER — Other Ambulatory Visit: Payer: Self-pay

## 2022-03-05 ENCOUNTER — Other Ambulatory Visit: Payer: Self-pay

## 2022-03-12 ENCOUNTER — Encounter: Payer: Self-pay | Admitting: Hematology and Oncology

## 2022-03-12 ENCOUNTER — Other Ambulatory Visit: Payer: Self-pay

## 2022-03-13 ENCOUNTER — Other Ambulatory Visit: Payer: Self-pay

## 2022-04-02 ENCOUNTER — Other Ambulatory Visit: Payer: Self-pay

## 2022-04-02 ENCOUNTER — Encounter: Payer: Self-pay | Admitting: Hematology and Oncology

## 2022-04-02 MED ORDER — NOVOFINE PEN NEEDLE 32G X 6 MM MISC
98 refills | Status: AC
  Filled 2022-04-02: qty 100, 50d supply, fill #0

## 2022-04-02 MED FILL — Insulin Lispro Soln Pen-injector 100 Unit/ML (1 Unit Dial): SUBCUTANEOUS | 50 days supply | Qty: 15 | Fill #0 | Status: AC

## 2022-04-15 ENCOUNTER — Other Ambulatory Visit: Payer: Self-pay

## 2022-04-15 ENCOUNTER — Ambulatory Visit: Payer: Medicaid Other | Admitting: Gerontology

## 2022-04-15 ENCOUNTER — Encounter: Payer: Self-pay | Admitting: Gerontology

## 2022-04-15 ENCOUNTER — Encounter: Payer: Self-pay | Admitting: Hematology and Oncology

## 2022-04-15 VITALS — BP 123/89 | HR 88 | Temp 97.7°F | Resp 16 | Ht 68.0 in | Wt 200.1 lb

## 2022-04-15 DIAGNOSIS — I10 Essential (primary) hypertension: Secondary | ICD-10-CM

## 2022-04-15 DIAGNOSIS — E1140 Type 2 diabetes mellitus with diabetic neuropathy, unspecified: Secondary | ICD-10-CM

## 2022-04-15 DIAGNOSIS — E1169 Type 2 diabetes mellitus with other specified complication: Secondary | ICD-10-CM

## 2022-04-15 LAB — POCT GLYCOSYLATED HEMOGLOBIN (HGB A1C): Hemoglobin A1C: 14 % — AB (ref 4.0–5.6)

## 2022-04-15 LAB — GLUCOSE, POCT (MANUAL RESULT ENTRY): POC Glucose: 259 mg/dl — AB (ref 70–99)

## 2022-04-15 MED ORDER — PRAVASTATIN SODIUM 80 MG PO TABS
80.0000 mg | ORAL_TABLET | Freq: Every day | ORAL | 2 refills | Status: DC
  Filled 2022-04-15: qty 30, 30d supply, fill #0
  Filled 2022-05-19: qty 25, 25d supply, fill #1
  Filled 2022-05-19: qty 5, 5d supply, fill #1
  Filled 2022-07-01: qty 30, 30d supply, fill #2

## 2022-04-15 MED ORDER — BASAGLAR KWIKPEN 100 UNIT/ML ~~LOC~~ SOPN
34.0000 [IU] | PEN_INJECTOR | Freq: Every day | SUBCUTANEOUS | 5 refills | Status: DC
  Filled 2022-04-15: qty 15, 44d supply, fill #0

## 2022-04-15 MED ORDER — AMLODIPINE BESYLATE 5 MG PO TABS
5.0000 mg | ORAL_TABLET | Freq: Every day | ORAL | 2 refills | Status: DC
  Filled 2022-04-15 – 2022-05-11 (×2): qty 30, 30d supply, fill #0
  Filled 2022-06-23: qty 30, 30d supply, fill #1

## 2022-04-15 NOTE — Progress Notes (Signed)
Established Patient Office Visit  Subjective   Patient ID: Luke [name], male    DOB: 02-24-1981  Age: 41 y.o. MRN: [id number]  Chief Complaint  Patient presents with   Follow-up   Diabetes    HPI  Luke [name]  is a 41 y/o male who has history of T2DM, Hypertension, Anemia, Cecum mass, CKD, GERD, Sleep apnea, presents for follow up on T2DM. And medication refill. His HgbA1c done during visit increased from 9.4% to 14% and blood glucose was 259 mg/dl. He is not compliant with checking his blood glucose, states that he checks it every 2 weeks, but states that he takes 30 units of Basaglar at bedtime, and 15 units of Lispro tid with meals.  He denies hypoglycemic symptoms, but endorses polydipsia and polyuria,, and performs daily foot checks.  Overall, he states that he is doing well and offers no further complaints.  Review of Systems  Constitutional: Negative.   Respiratory: Negative.    Cardiovascular: Negative.   Skin: Negative.   Neurological: Negative.   Endo/Heme/Allergies: Negative.       Objective:     BP 123/89 (BP Location: Right Arm, Patient Position: Sitting, Cuff Size: Large)   Pulse 88   Temp 97.7 F (36.5 C) (Oral)   Resp 16   Ht '5\' 8"'$  (1.727 m)   Wt 200 lb 1.6 oz (90.8 kg)   SpO2 96%   BMI 30.43 kg/m  BP Readings from Last 3 Encounters:  04/15/22 123/89  01/14/22 (!) 134/91  09/17/21 127/88   Wt Readings from Last 3 Encounters:  04/15/22 200 lb 1.6 oz (90.8 kg)  01/14/22 205 lb 8 oz (93.2 kg)  09/17/21 198 lb 3.2 oz (89.9 kg)      Physical Exam HENT:     Head: Normocephalic and atraumatic.     Mouth/Throat:     Mouth: Mucous membranes are moist.  Eyes:     Extraocular Movements: Extraocular movements intact.     Conjunctiva/sclera: Conjunctivae normal.     Pupils: Pupils are equal, round, and reactive to light.  Cardiovascular:     Rate and Rhythm: Normal rate and regular rhythm.     Pulses: Normal pulses.     Heart sounds: Normal  heart sounds.  Pulmonary:     Effort: Pulmonary effort is normal.     Breath sounds: Normal breath sounds.  Skin:    General: Skin is warm.  Neurological:     General: No focal deficit present.     Mental Status: He is alert and oriented to person, place, and time. Mental status is at baseline.  Psychiatric:        Mood and Affect: Mood normal.        Behavior: Behavior normal.        Thought Content: Thought content normal.        Judgment: Judgment normal.      Results for orders placed or performed in visit on 04/15/22  POCT Glucose (CBG)  Result Value Ref Range   POC Glucose 259 (A) 70 - 99 mg/dl  POCT HgB A1C  Result Value Ref Range   Hemoglobin A1C 14.0 (A) 4.0 - 5.6 %   HbA1c POC (<> result, manual entry)     HbA1c, POC (prediabetic range)     HbA1c, POC (controlled diabetic range)      Last CBC Lab Results  Component Value Date   WBC 5.6 09/12/2021   HGB 16.1 09/12/2021   HCT  47.0 09/12/2021   MCV 85.9 09/12/2021   MCH 29.4 09/12/2021   RDW 12.6 09/12/2021   PLT 147 (L) 25/36/6440   Last metabolic panel Lab Results  Component Value Date   GLUCOSE 253 (H) 09/12/2021   NA 136 09/12/2021   K 3.6 09/12/2021   CL 102 09/12/2021   CO2 25 09/12/2021   BUN 11 09/12/2021   CREATININE 0.94 09/12/2021   GFRNONAA >60 09/12/2021   CALCIUM 9.1 09/12/2021   PROT 7.3 09/12/2021   ALBUMIN 4.1 09/12/2021   LABGLOB 2.7 03/19/2021   AGRATIO 1.6 03/19/2021   BILITOT 0.8 09/12/2021   ALKPHOS 72 09/12/2021   AST 17 09/12/2021   ALT 21 09/12/2021   ANIONGAP 9 09/12/2021   Last lipids Lab Results  Component Value Date   CHOL 178 09/10/2021   HDL 24 (L) 09/10/2021   LDLCALC 71 09/10/2021   TRIG 532 (H) 09/10/2021   CHOLHDL 7.4 (H) 09/10/2021   Last hemoglobin A1c Lab Results  Component Value Date   HGBA1C 14.0 (A) 04/15/2022      The 10-year ASCVD risk score (Arnett DK, et al., 2019) is: 19.8%    Assessment & Plan:   1. Type 2 diabetes mellitus with  diabetic neuropathy, without long-term current use of insulin (HCC) -His diabetes is not under control, his hemoglobin A1c was 14 and his goal should be less than 7%.  His Basaglar was increased to 34 units at bedtime and he will continue with his mealtime insulin.  He was advised to check his blood glucose 3 times daily, record and bring log to follow up appointment. He was reminded that his fasting blood glucose goal should be between 80-130 mg/dl. He was encouraged to continue on low carb/non concentrated sweet diet and exercise as tolerated. - POCT HgB A1C; Future - POCT Glucose (CBG); Future - POCT Glucose (CBG) - POCT HgB A1C - HgB A1c - Insulin Glargine (BASAGLAR KWIKPEN) 100 UNIT/ML; Inject 34 Units into the skin at bedtime.  Dispense: 3 mL; Refill: 5  2. Hyperlipidemia associated with type 2 diabetes mellitus ([place]) - His ASCVD  was 22.9%, he will continue on current medication, low fat/cholesterol diet and exercise as tolerated. - pravastatin (PRAVACHOL) 80 MG tablet; Take 1 tablet (80 mg total) by mouth daily.  Dispense: 30 tablet; Refill: 2 - Lipid panel; Future  3. Essential hypertension - His blood pressure is under control and he will continue on current medication, DASH diet and exercise as tolerated. - amLODipine (NORVASC) 5 MG tablet; TAKE ONE TABLET BY MOUTH ONCE EVERY DAY  Dispense: 30 tablet; Refill: 2    Return in about 29 days (around 05/14/2022), or if symptoms worsen or fail to improve.    Sammye Staff Jerold Coombe, NP

## 2022-04-15 NOTE — Patient Instructions (Signed)

## 2022-04-16 ENCOUNTER — Other Ambulatory Visit: Payer: Self-pay

## 2022-04-16 LAB — HEMOGLOBIN A1C
Est. average glucose Bld gHb Est-mCnc: 335 mg/dL
Hgb A1c MFr Bld: 13.3 % — ABNORMAL HIGH (ref 4.8–5.6)

## 2022-04-21 ENCOUNTER — Ambulatory Visit: Payer: Medicaid Other

## 2022-05-06 ENCOUNTER — Other Ambulatory Visit: Payer: Self-pay

## 2022-05-06 ENCOUNTER — Other Ambulatory Visit: Payer: Self-pay | Admitting: Gerontology

## 2022-05-06 ENCOUNTER — Encounter: Payer: Self-pay | Admitting: Hematology and Oncology

## 2022-05-06 DIAGNOSIS — E1140 Type 2 diabetes mellitus with diabetic neuropathy, unspecified: Secondary | ICD-10-CM

## 2022-05-06 MED FILL — Insulin Lispro Soln Pen-injector 100 Unit/ML (1 Unit Dial): SUBCUTANEOUS | 50 days supply | Qty: 15 | Fill #1 | Status: CN

## 2022-05-06 MED FILL — Insulin Lispro Soln Pen-injector 100 Unit/ML (1 Unit Dial): SUBCUTANEOUS | 50 days supply | Qty: 15 | Fill #1 | Status: AC

## 2022-05-11 ENCOUNTER — Encounter: Payer: Self-pay | Admitting: Hematology and Oncology

## 2022-05-11 ENCOUNTER — Other Ambulatory Visit: Payer: Self-pay

## 2022-05-14 ENCOUNTER — Ambulatory Visit: Payer: Medicaid Other | Admitting: Gerontology

## 2022-05-19 ENCOUNTER — Encounter: Payer: Self-pay | Admitting: Hematology and Oncology

## 2022-05-19 ENCOUNTER — Other Ambulatory Visit: Payer: Self-pay | Admitting: Gerontology

## 2022-05-19 ENCOUNTER — Other Ambulatory Visit: Payer: Self-pay

## 2022-05-19 DIAGNOSIS — E1140 Type 2 diabetes mellitus with diabetic neuropathy, unspecified: Secondary | ICD-10-CM

## 2022-05-19 MED FILL — Gabapentin Cap 300 MG: ORAL | 30 days supply | Qty: 30 | Fill #0 | Status: AC

## 2022-06-10 ENCOUNTER — Encounter: Payer: Self-pay | Admitting: Hematology and Oncology

## 2022-06-10 ENCOUNTER — Other Ambulatory Visit: Payer: Self-pay

## 2022-06-23 ENCOUNTER — Other Ambulatory Visit: Payer: Self-pay

## 2022-06-23 ENCOUNTER — Encounter: Payer: Self-pay | Admitting: Hematology and Oncology

## 2022-07-01 ENCOUNTER — Other Ambulatory Visit: Payer: Self-pay

## 2022-07-01 ENCOUNTER — Other Ambulatory Visit: Payer: Self-pay | Admitting: Gerontology

## 2022-07-01 ENCOUNTER — Encounter: Payer: Self-pay | Admitting: Hematology and Oncology

## 2022-07-01 DIAGNOSIS — E1140 Type 2 diabetes mellitus with diabetic neuropathy, unspecified: Secondary | ICD-10-CM

## 2022-07-01 MED FILL — Gabapentin Cap 300 MG: ORAL | 30 days supply | Qty: 30 | Fill #0 | Status: AC

## 2022-07-03 ENCOUNTER — Other Ambulatory Visit: Payer: Self-pay

## 2022-07-16 ENCOUNTER — Ambulatory Visit: Payer: Medicaid Other | Admitting: Gerontology

## 2022-07-16 ENCOUNTER — Encounter: Payer: Self-pay | Admitting: Gerontology

## 2022-07-16 ENCOUNTER — Encounter: Payer: Self-pay | Admitting: Hematology and Oncology

## 2022-07-16 ENCOUNTER — Other Ambulatory Visit: Payer: Self-pay

## 2022-07-16 VITALS — BP 136/90 | HR 77 | Resp 16 | Wt 199.0 lb

## 2022-07-16 DIAGNOSIS — I10 Essential (primary) hypertension: Secondary | ICD-10-CM

## 2022-07-16 DIAGNOSIS — E1140 Type 2 diabetes mellitus with diabetic neuropathy, unspecified: Secondary | ICD-10-CM

## 2022-07-16 LAB — GLUCOSE, POCT (MANUAL RESULT ENTRY): POC Glucose: 222 mg/dl — AB (ref 70–99)

## 2022-07-16 LAB — POCT GLYCOSYLATED HEMOGLOBIN (HGB A1C): Hemoglobin A1C: 12.5 % — AB (ref 4.0–5.6)

## 2022-07-16 MED ORDER — AMLODIPINE BESYLATE 5 MG PO TABS
5.0000 mg | ORAL_TABLET | Freq: Every day | ORAL | 2 refills | Status: DC
  Filled 2022-07-16: qty 30, 30d supply, fill #0
  Filled 2022-08-21: qty 30, 30d supply, fill #1
  Filled 2022-09-28: qty 30, 30d supply, fill #2

## 2022-07-16 MED ORDER — GABAPENTIN 300 MG PO CAPS
300.0000 mg | ORAL_CAPSULE | Freq: Every day | ORAL | 2 refills | Status: DC
  Filled 2022-07-16 – 2022-08-06 (×2): qty 30, 30d supply, fill #0
  Filled 2022-09-11: qty 30, 30d supply, fill #1
  Filled 2022-10-12: qty 30, 30d supply, fill #2

## 2022-07-16 NOTE — Patient Instructions (Signed)

## 2022-07-16 NOTE — Progress Notes (Signed)
Established Patient Office Visit  Subjective   Patient ID: Luke [name], male    DOB: 02-08-1981  Age: 41 y.o. MRN: [id number]  No chief complaint on file.   HPI  Luke [name]  is a 41 y/o male who has history of T2DM, Hypertension, Anemia, Cecum mass, CKD, GERD, Sleep apnea, presents for follow up on T2DM. He states that he's compliant with his medications, denies side effects and continues to make healthy lifestyle changes. His HgbA1c checked during visit decreased from 13.3% to 12.5% and his blood glucose was 222 mg/dl. He states that he checks his blood glucose every other day, denies hypoglycemic symptoms, and his peripheral neuropathy is controlled with taking 300 mg Gabapentin and performs daily foot checks. Overall, he states that he's doing well and offers no further complaint.  Review of Systems  Constitutional: Negative.   Eyes: Negative.   Respiratory: Negative.    Cardiovascular: Negative.   Skin: Negative.   Neurological: Negative.   Endo/Heme/Allergies:  Positive for polydipsia.  Psychiatric/Behavioral: Negative.        Objective:     BP (!) 136/90 (BP Location: Right Arm, Patient Position: Sitting, Cuff Size: Large)   Pulse 77   Resp 16   Wt 199 lb (90.3 kg)   BMI 30.26 kg/m  BP Readings from Last 3 Encounters:  07/16/22 (!) 136/90  04/15/22 123/89  01/14/22 (!) 134/91   Wt Readings from Last 3 Encounters:  07/16/22 199 lb (90.3 kg)  04/15/22 200 lb 1.6 oz (90.8 kg)  01/14/22 205 lb 8 oz (93.2 kg)      Physical Exam HENT:     Head: Normocephalic and atraumatic.     Mouth/Throat:     Mouth: Mucous membranes are moist.  Eyes:     Extraocular Movements: Extraocular movements intact.     Conjunctiva/sclera: Conjunctivae normal.     Pupils: Pupils are equal, round, and reactive to light.  Cardiovascular:     Rate and Rhythm: Normal rate and regular rhythm.     Pulses: Normal pulses.     Heart sounds: Normal heart sounds.  Pulmonary:      Effort: Pulmonary effort is normal.     Breath sounds: Normal breath sounds.  Skin:    General: Skin is warm.  Neurological:     General: No focal deficit present.     Mental Status: He is alert and oriented to person, place, and time. Mental status is at baseline.  Psychiatric:        Mood and Affect: Mood normal.        Behavior: Behavior normal.        Thought Content: Thought content normal.        Judgment: Judgment normal.      Results for orders placed or performed in visit on 07/16/22  POCT Glucose (CBG)  Result Value Ref Range   POC Glucose 222 (A) 70 - 99 mg/dl  POCT HgB A1C  Result Value Ref Range   Hemoglobin A1C 12.5 (A) 4.0 - 5.6 %   HbA1c POC (<> result, manual entry)     HbA1c, POC (prediabetic range)     HbA1c, POC (controlled diabetic range)      Last CBC Lab Results  Component Value Date   WBC 5.6 09/12/2021   HGB 16.1 09/12/2021   HCT 47.0 09/12/2021   MCV 85.9 09/12/2021   MCH 29.4 09/12/2021   RDW 12.6 09/12/2021   PLT 147 (L) 09/12/2021  Last metabolic panel Lab Results  Component Value Date   GLUCOSE 253 (H) 09/12/2021   NA 136 09/12/2021   K 3.6 09/12/2021   CL 102 09/12/2021   CO2 25 09/12/2021   BUN 11 09/12/2021   CREATININE 0.94 09/12/2021   GFRNONAA >60 09/12/2021   CALCIUM 9.1 09/12/2021   PROT 7.3 09/12/2021   ALBUMIN 4.1 09/12/2021   LABGLOB 2.7 03/19/2021   AGRATIO 1.6 03/19/2021   BILITOT 0.8 09/12/2021   ALKPHOS 72 09/12/2021   AST 17 09/12/2021   ALT 21 09/12/2021   ANIONGAP 9 09/12/2021   Last lipids Lab Results  Component Value Date   CHOL 178 09/10/2021   HDL 24 (L) 09/10/2021   LDLCALC 71 09/10/2021   TRIG 532 (H) 09/10/2021   CHOLHDL 7.4 (H) 09/10/2021   Last hemoglobin A1c Lab Results  Component Value Date   HGBA1C 12.5 (A) 07/16/2022      The 10-year ASCVD risk score (Arnett DK, et al., 2019) is: 23.5%    Assessment & Plan:   1. Type 2 diabetes mellitus with diabetic neuropathy, without  long-term current use of insulin (HCC) -His HgbA1c was 12.5%, he was strongly encouraged to check his blood glucose tid, record and bring log to follow up appointment. He will continue on current medication, low carb/non concentrated sweet diet and exercise as tolerated. His diabetes is not controlled because of non compliance. - POCT HgB A1C; Future - POCT Glucose (CBG); Future - gabapentin (NEURONTIN) 300 MG capsule; Take 1 capsule (300 mg total) by mouth once daily at bedtime.  Dispense: 30 capsule; Refill: 2 - POCT Glucose (CBG) - POCT HgB A1C  2. Essential hypertension - His blood pressure is improving, his goal should be less than 130/80. He will continue current medication, DASH diet and exercise as tolerated. - amLODipine (NORVASC) 5 MG tablet; TAKE ONE TABLET BY MOUTH ONCE EVERY DAY  Dispense: 30 tablet; Refill: 2    Return in about 4 weeks (around 08/13/2022), or if symptoms worsen or fail to improve.    Corneilus Heggie Jerold Coombe, NP

## 2022-07-20 ENCOUNTER — Encounter: Payer: Self-pay | Admitting: Hematology and Oncology

## 2022-07-20 ENCOUNTER — Other Ambulatory Visit: Payer: Self-pay | Admitting: Gerontology

## 2022-07-20 ENCOUNTER — Other Ambulatory Visit: Payer: Self-pay

## 2022-07-20 DIAGNOSIS — E1140 Type 2 diabetes mellitus with diabetic neuropathy, unspecified: Secondary | ICD-10-CM

## 2022-07-20 MED FILL — Insulin Lispro Soln Pen-injector 100 Unit/ML (1 Unit Dial): SUBCUTANEOUS | 50 days supply | Qty: 15 | Fill #2 | Status: AC

## 2022-07-21 ENCOUNTER — Encounter: Payer: Self-pay | Admitting: Hematology and Oncology

## 2022-07-21 ENCOUNTER — Other Ambulatory Visit: Payer: Self-pay

## 2022-07-21 MED FILL — Insulin Glargine Soln Pen-Injector 100 Unit/ML: SUBCUTANEOUS | 44 days supply | Qty: 15 | Fill #0 | Status: AC

## 2022-08-03 ENCOUNTER — Other Ambulatory Visit: Payer: Self-pay

## 2022-08-06 ENCOUNTER — Other Ambulatory Visit: Payer: Self-pay

## 2022-08-06 ENCOUNTER — Encounter: Payer: Self-pay | Admitting: Hematology and Oncology

## 2022-08-06 ENCOUNTER — Other Ambulatory Visit: Payer: Self-pay | Admitting: Gerontology

## 2022-08-06 DIAGNOSIS — E1169 Type 2 diabetes mellitus with other specified complication: Secondary | ICD-10-CM

## 2022-08-06 MED ORDER — PRAVASTATIN SODIUM 80 MG PO TABS
80.0000 mg | ORAL_TABLET | Freq: Every day | ORAL | 2 refills | Status: DC
  Filled 2022-08-06: qty 30, 30d supply, fill #0
  Filled 2022-09-28: qty 30, 30d supply, fill #1
  Filled 2022-10-28: qty 30, 30d supply, fill #2

## 2022-08-10 ENCOUNTER — Other Ambulatory Visit: Payer: Self-pay

## 2022-08-10 ENCOUNTER — Encounter: Payer: Self-pay | Admitting: Hematology and Oncology

## 2022-08-18 ENCOUNTER — Ambulatory Visit: Payer: Medicaid Other | Admitting: Gerontology

## 2022-08-19 ENCOUNTER — Ambulatory Visit: Payer: Medicaid Other | Admitting: Gerontology

## 2022-08-21 ENCOUNTER — Other Ambulatory Visit: Payer: Self-pay

## 2022-08-21 ENCOUNTER — Encounter: Payer: Self-pay | Admitting: Hematology and Oncology

## 2022-09-11 ENCOUNTER — Encounter: Payer: Self-pay | Admitting: Hematology and Oncology

## 2022-09-11 ENCOUNTER — Other Ambulatory Visit: Payer: Self-pay

## 2022-09-14 ENCOUNTER — Other Ambulatory Visit: Payer: Self-pay

## 2022-09-14 ENCOUNTER — Encounter: Payer: Self-pay | Admitting: Hematology and Oncology

## 2022-09-14 MED FILL — Insulin Glargine Soln Pen-Injector 100 Unit/ML: SUBCUTANEOUS | 44 days supply | Qty: 15 | Fill #1 | Status: AC

## 2022-09-14 MED FILL — Insulin Lispro Soln Pen-injector 100 Unit/ML (1 Unit Dial): SUBCUTANEOUS | 50 days supply | Qty: 15 | Fill #3 | Status: AC

## 2022-09-28 ENCOUNTER — Encounter: Payer: Self-pay | Admitting: Hematology and Oncology

## 2022-09-28 ENCOUNTER — Other Ambulatory Visit: Payer: Self-pay

## 2022-10-09 ENCOUNTER — Other Ambulatory Visit: Payer: Self-pay

## 2022-10-12 ENCOUNTER — Other Ambulatory Visit: Payer: Self-pay

## 2022-10-12 ENCOUNTER — Encounter: Payer: Self-pay | Admitting: Hematology and Oncology

## 2022-10-28 ENCOUNTER — Encounter: Payer: Self-pay | Admitting: Hematology and Oncology

## 2022-10-28 ENCOUNTER — Other Ambulatory Visit: Payer: Self-pay | Admitting: Gerontology

## 2022-10-28 ENCOUNTER — Other Ambulatory Visit: Payer: Self-pay

## 2022-10-28 DIAGNOSIS — E1140 Type 2 diabetes mellitus with diabetic neuropathy, unspecified: Secondary | ICD-10-CM

## 2022-10-28 DIAGNOSIS — I10 Essential (primary) hypertension: Secondary | ICD-10-CM

## 2022-10-28 MED ORDER — AMLODIPINE BESYLATE 5 MG PO TABS
5.0000 mg | ORAL_TABLET | Freq: Every day | ORAL | 0 refills | Status: DC
  Filled 2022-10-28 – 2022-11-12 (×2): qty 30, 30d supply, fill #0

## 2022-10-28 NOTE — Telephone Encounter (Signed)
Patient needs OV.

## 2022-11-03 ENCOUNTER — Telehealth: Payer: Self-pay | Admitting: Emergency Medicine

## 2022-11-03 NOTE — Telephone Encounter (Signed)
Tried to call the patient x 2 to advise of need for OV. Call could not be completed.

## 2022-11-10 ENCOUNTER — Other Ambulatory Visit: Payer: Self-pay

## 2022-11-12 ENCOUNTER — Encounter: Payer: Self-pay | Admitting: Hematology and Oncology

## 2022-11-12 ENCOUNTER — Other Ambulatory Visit: Payer: Self-pay

## 2022-11-12 ENCOUNTER — Other Ambulatory Visit: Payer: Self-pay | Admitting: Gerontology

## 2022-11-12 DIAGNOSIS — E1140 Type 2 diabetes mellitus with diabetic neuropathy, unspecified: Secondary | ICD-10-CM

## 2022-11-12 MED FILL — Gabapentin Cap 300 MG: ORAL | 10 days supply | Qty: 10 | Fill #0 | Status: AC

## 2022-11-18 ENCOUNTER — Encounter: Payer: Self-pay | Admitting: Hematology and Oncology

## 2022-11-18 ENCOUNTER — Other Ambulatory Visit: Payer: Self-pay

## 2022-11-18 MED FILL — Insulin Lispro Soln Pen-injector 100 Unit/ML (1 Unit Dial): SUBCUTANEOUS | 50 days supply | Qty: 15 | Fill #4 | Status: AC

## 2022-11-19 ENCOUNTER — Other Ambulatory Visit: Payer: Self-pay

## 2022-11-19 ENCOUNTER — Encounter: Payer: Self-pay | Admitting: Hematology and Oncology

## 2022-12-07 ENCOUNTER — Encounter: Payer: Self-pay | Admitting: Hematology and Oncology

## 2022-12-07 ENCOUNTER — Other Ambulatory Visit: Payer: Self-pay

## 2022-12-07 ENCOUNTER — Encounter: Payer: Self-pay | Admitting: Surgery

## 2022-12-07 ENCOUNTER — Ambulatory Visit (INDEPENDENT_AMBULATORY_CARE_PROVIDER_SITE_OTHER): Payer: Commercial Managed Care - HMO | Admitting: Surgery

## 2022-12-07 VITALS — BP 133/93 | HR 80 | Temp 98.7°F | Ht 68.0 in | Wt 190.0 lb

## 2022-12-07 DIAGNOSIS — K4090 Unilateral inguinal hernia, without obstruction or gangrene, not specified as recurrent: Secondary | ICD-10-CM

## 2022-12-07 NOTE — Patient Instructions (Signed)
Our surgery scheduler will call you within 24-48 hours to schedule surgery. Please have the [place] surgery sheet available when speaking with her.   Inguinal Hernia, Adult An inguinal hernia develops when fat or the intestines push through a weak spot in a muscle where the leg meets the lower abdomen (groin). This creates a bulge. This kind of hernia could also be: In the scrotum, if you are male. In folds of skin around the vagina, if you are male. There are three types of inguinal hernias: Hernias that can be pushed back into the abdomen (are reducible). This type rarely causes pain. Hernias that are not reducible (are incarcerated). Hernias that are not reducible and lose their blood supply (are strangulated). This type of hernia requires emergency surgery. What are the causes? This condition is caused by having a weak spot in the muscles or tissues in your groin. This develops over time. The hernia may poke through the weak spot when you suddenly strain your lower abdominal muscles, such as when you: Lift a heavy object. Strain to have a bowel movement. Constipation can lead to straining. Cough. What increases the risk? This condition is more likely to develop in: Males. Pregnant females. People who: Are overweight. Work in jobs that require long periods of standing or heavy lifting. Have had an inguinal hernia before. Smoke or have lung disease. These factors can lead to long-term (chronic) coughing. What are the signs or symptoms? Symptoms may depend on the size of the hernia. Often, a small inguinal hernia has no symptoms. Symptoms of a larger hernia may include: A bulge in the groin area. This is easier to see when standing. It might not be visible when lying down. Pain or burning in the groin. This may get worse when lifting, straining, or coughing. A dull ache or a feeling of pressure in the groin. An unusual bulge in the scrotum, in males. Symptoms of a strangulated  inguinal hernia may include: A bulge in your groin that is very painful and tender to the touch. A bulge that turns red or purple. Fever, nausea, and vomiting. Inability to have a bowel movement or to pass gas. How is this diagnosed? This condition is diagnosed based on your symptoms, your medical history, and a physical exam. Your health care provider may feel your groin area and ask you to cough. How is this treated? Treatment depends on the size of your hernia and whether you have symptoms. If you do not have symptoms, your health care provider may have you watch your hernia carefully and have you come in for follow-up visits. If your hernia is large or if you have symptoms, you may need surgery to repair the hernia. Follow these instructions at home: Lifestyle Avoid lifting heavy objects. Avoid standing for long periods of time. Do not use any products that contain nicotine or tobacco. These products include cigarettes, chewing tobacco, and vaping devices, such as e-cigarettes. If you need help quitting, ask your health care provider. Maintain a healthy weight. Preventing constipation You may need to take these actions to prevent or treat constipation: Drink enough fluid to keep your urine pale yellow. Take over-the-counter or prescription medicines. Eat foods that are high in fiber, such as beans, whole grains, and fresh fruits and vegetables. Limit foods that are high in fat and processed sugars, such as fried or sweet foods. General instructions You may try to push the hernia back in place by very gently pressing on it while lying down. Do  not try to force the bulge back in if it will not push in easily. Watch your hernia for any changes in shape, size, or color. Get help right away if you notice any changes. Take over-the-counter and prescription medicines only as told by your health care provider. Keep all follow-up visits. This is important. Contact a health care provider if: You  have a fever or chills. You develop new symptoms. Your symptoms get worse. Get help right away if: You have pain in your groin that suddenly gets worse. You have a bulge in your groin that: Suddenly gets bigger and does not get smaller. Becomes red or purple or painful to the touch. You are a man and you have a sudden pain in your scrotum, or the size of your scrotum suddenly changes. You cannot push the hernia back in place by very gently pressing on it when you are lying down. You have nausea or vomiting that does not go away. You have a fast heartbeat. You cannot have a bowel movement or pass gas. These symptoms may represent a serious problem that is an emergency. Do not wait to see if the symptoms will go away. Get medical help right away. Call your local emergency services (911 in the [place] Summary An inguinal hernia develops when fat or the intestines push through a weak spot in a muscle where your leg meets your lower abdomen (groin). This condition is caused by having a weak spot in muscles or tissues in your groin. Symptoms may depend on the size of the hernia, and they may include pain or swelling in your groin. A small inguinal hernia often has no symptoms. Treatment may not be needed if you do not have symptoms. If you have symptoms or a large hernia, you may need surgery to repair the hernia. Avoid lifting heavy objects. Also, avoid standing for long periods of time. This information is not intended to replace advice given to you by your health care provider. Make sure you discuss any questions you have with your health care provider. Document Revised: 06/18/2020 Document Reviewed: 06/18/2020 Elsevier Patient Education  [employer].

## 2022-12-08 ENCOUNTER — Telehealth: Payer: Self-pay | Admitting: Surgery

## 2022-12-08 NOTE — Telephone Encounter (Signed)
Patient has been advised of Pre-Admission date/time, and Surgery date at [facility].  Surgery Date: 01/07/23 Preadmission Testing Date: 12/31/22 (phone 8a-1p)  Patient has been made aware to call [phone], between 1-3:00pm the day before surgery, to find out what time to arrive for surgery.    

## 2022-12-10 NOTE — Progress Notes (Addendum)
Outpatient Surgical Follow Up  12/10/2022  Luke [name] is an 42 y.o. male.   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  He did develop diverticulitis with abscess of sigmoid. I have recommended elective sigmoid colectomy in the past unfortunately he developed some hematuria that required further work-up.    He currently denies any left lower quadrant pain or any other acute exacerbations of diverticulitis however he does report significant symptoms related to  right inguinal hernia.   He has a kown symptomatic Inguinal hernia as well, he has been scheduled for repair but has cancelled multiple times. He experiences intermittent groin pain that is moderate intensity sharp and worsening with Valsalva.  He did have a CT scan that have personally reviewed showing evidence of a right inguinal hernia.  No definitive evidence of ventral hernia.  He endorses pain to the right of the midline  Past Medical History:  Diagnosis Date   Anemia    Cecum mass 08/15/2018   a.) 4.8 cm pedunculated cecal mass; Bx (+) for villous adeoma. b.) s/p RIGHT colectomy.   Chronic kidney disease    Colonic neoplasm    Diabetes mellitus without complication ([place])    Diverticulosis of large intestine without diverticulitis    GERD (gastroesophageal reflux disease)    Hypertension    Pneumonia 09/07/2016   Sleep apnea    Stomach irritation     Past Surgical History:  Procedure Laterality Date   COLON SURGERY     COLONOSCOPY WITH PROPOFOL N/A 07/28/2018   Procedure: COLONOSCOPY WITH PROPOFOL;  Surgeon: Virgel Manifold, MD;  Location: [facility] ENDOSCOPY;  Service: Endoscopy;  Laterality: N/A;   COLONOSCOPY WITH PROPOFOL N/A 10/18/2019   Procedure: COLONOSCOPY WITH PROPOFOL;  Surgeon: Virgel Manifold, MD;  Location: [facility]  ENDOSCOPY;  Service: Endoscopy;  Laterality: N/A;   COLONOSCOPY WITH PROPOFOL N/A 02/17/2021   Procedure: COLONOSCOPY WITH PROPOFOL;  Surgeon: Virgel Manifold, MD;  Location: [facility] ENDOSCOPY;  Service: Endoscopy;  Laterality: N/A;   ESOPHAGOGASTRODUODENOSCOPY (EGD) WITH PROPOFOL N/A 07/28/2018   Procedure: ESOPHAGOGASTRODUODENOSCOPY (EGD) WITH PROPOFOL;  Surgeon: Virgel Manifold, MD;  Location: [facility] ENDOSCOPY;  Service: Endoscopy;  Laterality: N/A;   PARTIAL COLECTOMY Right 08/15/2018   Procedure: RIGHT  COLECTOMY;  Surgeon: Vickie Epley, MD;  Location: [facility] ORS;  Service: General;  Laterality: Right;    Family History  Problem Relation Age of Onset   CAD Maternal Grandmother    Breast cancer Maternal Grandmother    Breast cancer Mother    Hypertension Mother    Hypertension Father    Colon cancer Paternal Uncle    Throat cancer Maternal Grandfather    Diabetes Paternal Grandmother     Social History:  reports that he quit smoking about 2 years ago. His smoking use included cigarettes. He smoked an average of .1 packs per day. He has never used smokeless tobacco. He reports current alcohol use. He reports that he does not currently use drugs after having used the following drugs: Marijuana.  Allergies: No Known Allergies  Medications reviewed.    ROS Full ROS performed and is otherwise negative other than what is stated in HPI   BP (!) 133/93   Pulse 80   Temp 98.7 F (37.1 C) (Oral)  Ht '5\' 8"'$  (1.727 m)   Wt 190 lb (86.2 kg)   SpO2 98%   BMI 28.89 kg/m   Physical Exam Vitals and nursing note reviewed. Exam conducted with a chaperone present.  Constitutional:      General: He is not in acute distress.    Appearance: Normal appearance. He is not toxic-appearing.  Eyes:     General: No scleral icterus.       Right eye: No discharge.        Left eye: No discharge.  Cardiovascular:     Rate and Rhythm: Normal rate and regular rhythm.     Heart sounds: No  murmur heard. Pulmonary:     Effort: Pulmonary effort is normal. No respiratory distress.     Breath sounds: Normal breath sounds. No stridor.  Abdominal:     General: Abdomen is flat. There is no distension.     Palpations: There is no mass.     Tenderness: There is no abdominal tenderness. There is no guarding or rebound.     Hernia: A hernia is present.     Comments: Reducible but tender RIH  Musculoskeletal:        General: No swelling. Normal range of motion.     Cervical back: Normal range of motion and neck supple. No rigidity or tenderness.  Skin:    General: Skin is warm and dry.     Capillary Refill: Capillary refill takes less than 2 seconds.     Coloration: Skin is not jaundiced.  Neurological:     General: No focal deficit present.     Mental Status: He is alert and oriented to person, place, and time.  Psychiatric:        Mood and Affect: Mood normal.        Behavior: Behavior normal.        Thought Content: Thought content normal.        Judgment: Judgment normal.     Assessment/Plan:  42 year old male with symptomatic right inguinal hernia in need for repair. Compliance seems to be a persistent issue.  There is question about the findings of a potential ventral hernia.  Exam is equivocal and CT scan does not necessarily show it.  Discussed with the patient in detail.  I definitely recommend robotic repair of right inguinal hernia possible bilateral.  Also discussed with him that in the event that we were to encounter an incisional hernia that would be amenable for repair I do think that we could perform this at the same time.  Procedure discussed with the patient in detail.  Risks, benefits and possible complications including but not limited to, bleeding, infection, chronic pain, mesh issues, recurrence.  He understands and wishes to proceed.  From the diverticulitis and perspective I do think that we need to do what is more symptomatic and more present at this time  that that we will be the right inguinal hernia.  I spent 40 minutes in this encounter including personally reviewing imaging studies, coordinating his care, counseling the patient and placing orders and performing appropriate documentation   Caroleen Hamman, MD [place] [facility]

## 2022-12-21 ENCOUNTER — Other Ambulatory Visit: Payer: Self-pay | Admitting: Gerontology

## 2022-12-21 ENCOUNTER — Other Ambulatory Visit: Payer: Self-pay

## 2022-12-21 DIAGNOSIS — I10 Essential (primary) hypertension: Secondary | ICD-10-CM

## 2022-12-21 DIAGNOSIS — E1140 Type 2 diabetes mellitus with diabetic neuropathy, unspecified: Secondary | ICD-10-CM

## 2022-12-22 ENCOUNTER — Other Ambulatory Visit: Payer: Self-pay

## 2022-12-24 ENCOUNTER — Encounter: Payer: Self-pay | Admitting: Hematology and Oncology

## 2022-12-25 ENCOUNTER — Other Ambulatory Visit: Payer: Self-pay

## 2022-12-28 ENCOUNTER — Other Ambulatory Visit: Payer: Self-pay

## 2022-12-28 ENCOUNTER — Other Ambulatory Visit: Payer: Self-pay | Admitting: Nurse Practitioner

## 2022-12-28 DIAGNOSIS — I10 Essential (primary) hypertension: Secondary | ICD-10-CM

## 2022-12-28 DIAGNOSIS — E1140 Type 2 diabetes mellitus with diabetic neuropathy, unspecified: Secondary | ICD-10-CM

## 2022-12-29 ENCOUNTER — Ambulatory Visit
Admission: EM | Admit: 2022-12-29 | Discharge: 2022-12-29 | Disposition: A | Discharge status: Home / Self Care | Payer: Medicaid Other | Attending: Urgent Care | Admitting: Urgent Care

## 2022-12-29 ENCOUNTER — Other Ambulatory Visit: Payer: Self-pay

## 2022-12-29 ENCOUNTER — Telehealth: Payer: Self-pay | Admitting: Urgent Care

## 2022-12-29 DIAGNOSIS — E1169 Type 2 diabetes mellitus with other specified complication: Secondary | ICD-10-CM

## 2022-12-29 DIAGNOSIS — Z760 Encounter for issue of repeat prescription: Secondary | ICD-10-CM

## 2022-12-29 DIAGNOSIS — E1140 Type 2 diabetes mellitus with diabetic neuropathy, unspecified: Secondary | ICD-10-CM | POA: Diagnosis not present

## 2022-12-29 DIAGNOSIS — E785 Hyperlipidemia, unspecified: Secondary | ICD-10-CM

## 2022-12-29 DIAGNOSIS — I10 Essential (primary) hypertension: Secondary | ICD-10-CM | POA: Diagnosis not present

## 2022-12-29 MED ORDER — PRAVASTATIN SODIUM 80 MG PO TABS
80.0000 mg | ORAL_TABLET | Freq: Every day | ORAL | 2 refills | Status: DC
  Filled 2022-12-29: qty 30, 30d supply, fill #0
  Filled 2023-02-05: qty 30, 30d supply, fill #1
  Filled 2023-03-12: qty 30, 30d supply, fill #2

## 2022-12-29 MED ORDER — AMLODIPINE BESYLATE 5 MG PO TABS
5.0000 mg | ORAL_TABLET | Freq: Every day | ORAL | 2 refills | Status: DC
  Filled 2022-12-29: qty 30, 30d supply, fill #0
  Filled 2023-02-05: qty 30, 30d supply, fill #1
  Filled 2023-03-12: qty 30, 30d supply, fill #2

## 2022-12-29 MED ORDER — INSULIN LISPRO (1 UNIT DIAL) 100 UNIT/ML (KWIKPEN)
10.0000 [IU] | PEN_INJECTOR | Freq: Three times a day (TID) | SUBCUTANEOUS | 2 refills | Status: DC
  Filled 2022-12-29: qty 9, 30d supply, fill #0
  Filled 2023-02-05 – 2023-02-18 (×2): qty 9, 30d supply, fill #1
  Filled 2023-03-15: qty 9, 30d supply, fill #2

## 2022-12-29 MED ORDER — BASAGLAR KWIKPEN 100 UNIT/ML ~~LOC~~ SOPN
34.0000 [IU] | PEN_INJECTOR | Freq: Every day | SUBCUTANEOUS | 2 refills | Status: DC
  Filled 2022-12-29: qty 15, 44d supply, fill #0
  Filled 2023-02-05: qty 15, 44d supply, fill #1
  Filled 2023-03-12: qty 15, 44d supply, fill #2

## 2022-12-29 MED ORDER — GABAPENTIN 300 MG PO CAPS
300.0000 mg | ORAL_CAPSULE | Freq: Every day | ORAL | 2 refills | Status: DC
  Filled 2022-12-29: qty 30, 30d supply, fill #0
  Filled 2023-02-05: qty 30, 30d supply, fill #1
  Filled 2023-03-12: qty 30, 30d supply, fill #2

## 2022-12-29 NOTE — ED Provider Notes (Signed)
Luke [name]    CSN: [id number] Arrival date & time: 12/29/22  1024      History   Chief Complaint Chief Complaint  Patient presents with   Medication Refill    HPI Luke [name] is a 42 y.o. male.    Medication Refill   Patient presents to urgent care requesting refills for multiple chronically used medications.  He was previously treated at a primary care that is unable to continue to treat him due to his Medicaid status.  He states that he has scheduled an appointment in April to establish care with a new PCP but has new refills for critically needed medications.  PMH includes DM, hyperlipidemia, hypertension.  PSH includes unilateral inguinal hernia with repair.  Patient was [facility] cared [facility] at [facility]" which is a free clinic and is not affiliated with any insurance companies.  He was recently approved for [facility] Medicaid and no longer eligible for treatment at the free clinic.  Luke [name] states that he has been out of medication for couple of weeks.  Normally takes medication as directed.  Past Medical History:  Diagnosis Date   Anemia    Cecum mass 08/15/2018   a.) 4.8 cm pedunculated cecal mass; Bx (+) for villous adeoma. b.) s/p RIGHT colectomy.   Chronic kidney disease    Colonic neoplasm    Diabetes mellitus without complication (Luke [name])    Diverticulosis of large intestine without diverticulitis    GERD (gastroesophageal reflux disease)    Hypertension    Pneumonia 09/07/2016   Sleep apnea    Stomach irritation     Patient Active Problem List   Diagnosis Date Noted   History of sleep apnea 04/29/2021   Type 2 diabetes mellitus with hyperglycemia ([name]) 12/26/2020   Abnormal laboratory test result 12/19/2020   Diverticulitis of colon with perforation 08/20/2020   Type 2 diabetes mellitus with diabetic neuropathy, unspecified ([name]) 04/04/2020   Type 2 diabetes mellitus without complications ([name]) [id number]   Snores  03/12/2020   Prediabetes 02/01/2020   Dyslipidemia (high LDL; low HDL) 02/01/2020   History of colonic polyps    Polyp of colon    Constipation 08/15/2019   History of inguinal hernia 08/15/2019   Acid reflux 08/15/2019   Unilateral inguinal hernia, without obstruction or gangrene, not specified as recurrent 05/01/2019   Hypertension 08/17/2018   Columnar-lined esophagus    Gastric polyp    Iron deficiency anemia 07/23/2018   B12 deficiency 07/23/2018    Past Surgical History:  Procedure Laterality Date   COLON SURGERY     COLONOSCOPY WITH PROPOFOL N/A 07/28/2018   Procedure: COLONOSCOPY WITH PROPOFOL;  Surgeon: Virgel Manifold, MD;  Location: [facility];  Service: Endoscopy;  Laterality: N/A;   COLONOSCOPY WITH PROPOFOL N/A 10/18/2019   Procedure: COLONOSCOPY WITH PROPOFOL;  Surgeon: Virgel Manifold, MD;  Location: [facility];  Service: Endoscopy;  Laterality: N/A;   COLONOSCOPY WITH PROPOFOL N/A 02/17/2021   Procedure: COLONOSCOPY WITH PROPOFOL;  Surgeon: Virgel Manifold, MD;  Location: [facility];  Service: Endoscopy;  Laterality: N/A;   ESOPHAGOGASTRODUODENOSCOPY (EGD) WITH PROPOFOL N/A 07/28/2018   Procedure: ESOPHAGOGASTRODUODENOSCOPY (EGD) WITH PROPOFOL;  Surgeon: Virgel Manifold, MD;  Location: [facility];  Service: Endoscopy;  Laterality: N/A;   PARTIAL COLECTOMY Right 08/15/2018   Procedure: RIGHT  COLECTOMY;  Surgeon: Vickie Epley, MD;  Location: [facility];  Service: General;  Laterality: Right;       Home Medications  Prior to Admission medications   Medication Sig Start Date End Date Taking? Authorizing Provider  acetaminophen-codeine (TYLENOL #3) 300-30 MG tablet Take 1 tablet by mouth every 6 (six) hours as needed. 07/09/22   [provider]  amLODipine (NORVASC) 5 MG tablet Take 1 tablet (5 mg total) by mouth daily. 10/28/22   Iloabachie, Chioma E, NP  amoxicillin (AMOXIL) 500 MG capsule Take 500 mg by mouth every 8  (eight) hours. 07/09/22   [provider]  blood glucose meter kit and supplies KIT Dispense based on patient and insurance preference. Use up to four times daily as directed. (FOR ICD-9 250.00, 250.01). 04/04/20   Iloabachie, Chioma E, NP  gabapentin (NEURONTIN) 300 MG capsule Take 1 capsule (300 mg total) by mouth at bedtime. 11/12/22   Iloabachie, Chioma E, NP  Insulin Glargine (BASAGLAR KWIKPEN) 100 UNIT/ML Inject 34 Units into the skin at bedtime. 07/21/22   Iloabachie, Chioma E, NP  insulin lispro (HUMALOG KWIKPEN) 100 UNIT/ML KwikPen INJECT 0.1 ML (10 UNITS TOTAL) INTO THE SKIN 3 TIMES A DAY BEFORE MEALS 01/27/22   Iloabachie, Chioma E, NP  Insulin Pen Needle (NOVOFINE PEN NEEDLE) 32G X 6 MM MISC use as directed 01/27/22   Cammy Copa, [facility]  Insulin Pen Needle 32G X 4 MM MISC USE AS DIRECTED WITH INSULIN. 01/27/22   Cammy Copa, RPH  pravastatin (PRAVACHOL) 80 MG tablet Take 1 tablet (80 mg total) by mouth daily. 08/06/22   Iloabachie, Chioma E, NP  metFORMIN (GLUCOPHAGE) 850 MG tablet Take 1 tablet (850 mg total) by mouth 2 (two) times daily with a meal. 12/18/20 12/19/20  Iloabachie, Chioma E, NP    Family History Family History  Problem Relation Age of Onset   CAD Maternal Grandmother    Breast cancer Maternal Grandmother    Breast cancer Mother    Hypertension Mother    Hypertension Father    Colon cancer Paternal Uncle    Throat cancer Maternal Grandfather    Diabetes Paternal Grandmother     Social History Social History   Tobacco Use   Smoking status: Former    Packs/day: 0.10    Types: Cigarettes    Quit date: 03/25/2020    Years since quitting: 2.7   Smokeless tobacco: Never   Tobacco comments:    patient is social smoker/ smokes when he drinks   Vaping Use   Vaping Use: Never used  Substance Use Topics   Alcohol use: Yes    Comment: patient reports he consumes ~2 beers about once per month   Drug use: Not Currently    Types: Marijuana    Comment: last  use 2020     Allergies   Patient has no known allergies.   Review of Systems Review of Systems   Physical Exam Triage Vital Signs ED Triage Vitals  Enc Vitals Group     BP --      Pulse Rate 12/29/22 1115 75     Resp 12/29/22 1115 18     Temp 12/29/22 1115 97.8 F (36.6 C)     Temp Source 12/29/22 1115 Temporal     SpO2 12/29/22 1115 97 %     Weight --      Height --      Head Circumference --      Peak Flow --      Pain Score 12/29/22 1116 0     Pain Loc --      Pain Edu? --  Excl. in GC? --    No data found.  Updated Vital Signs Pulse 75   Temp 97.8 F (36.6 C) (Temporal)   Resp 18   SpO2 97%   Visual Acuity Right Eye Distance:   Left Eye Distance:   Bilateral Distance:    Right Eye Near:   Left Eye Near:    Bilateral Near:     Physical Exam Vitals reviewed.  Constitutional:      Appearance: Normal appearance.  Skin:    General: Skin is warm and dry.  Neurological:     General: No focal deficit present.     Mental Status: He is alert and oriented to person, place, and time.  Psychiatric:        Mood and Affect: Mood normal.        Behavior: Behavior normal.      UC Treatments / Results  Labs (all labs ordered are listed, but only abnormal results are displayed) Labs Reviewed - No data to display  EKG   Radiology No results found.  Procedures Procedures (including critical care time)  Medications Ordered in UC Medications - No data to display  Initial Impression / Assessment and Plan / UC Course  I have reviewed the triage vital signs and the nursing notes.  Pertinent labs & imaging results that were available during my care of the patient were reviewed by me and considered in my medical decision making (see chart for details).   Patient is afebrile here without recent antipyretics. Satting well on room air. Overall is well appearing, well hydrated, without respiratory distress. Pulmonary exam is unremarkable.  Lungs CTAB  without wheezing, rhonchi, rales.  32-monthprescription provided to patient.   Final Clinical Impressions(s) / UC Diagnoses   Final diagnoses:  None   Discharge Instructions   None    ED Prescriptions   None    PDMP not reviewed this encounter.   IRose Phi FNorth [place]02/27/24 1137

## 2022-12-29 NOTE — Discharge Instructions (Signed)
Follow up with new PCP for additional refills.

## 2022-12-29 NOTE — Telephone Encounter (Signed)
Opened in error

## 2022-12-29 NOTE — ED Triage Notes (Signed)
Patient presents to UC for medication refills for amlodipine, Pravachol, insulin basaglar, humalog, and gabapentin. States he sees new PCP in April.

## 2022-12-31 ENCOUNTER — Encounter
Admission: RE | Admit: 2022-12-31 | Discharge: 2022-12-31 | Disposition: A | Discharge status: Home / Self Care | Payer: Commercial Managed Care - HMO | Source: Ambulatory Visit | Attending: Surgery | Admitting: Surgery

## 2022-12-31 ENCOUNTER — Encounter: Payer: Self-pay | Admitting: Urgent Care

## 2022-12-31 ENCOUNTER — Encounter: Payer: Self-pay | Admitting: Hematology and Oncology

## 2022-12-31 ENCOUNTER — Other Ambulatory Visit: Payer: Self-pay

## 2022-12-31 VITALS — Ht 68.0 in | Wt 190.0 lb

## 2022-12-31 DIAGNOSIS — I10 Essential (primary) hypertension: Secondary | ICD-10-CM

## 2022-12-31 DIAGNOSIS — E1165 Type 2 diabetes mellitus with hyperglycemia: Secondary | ICD-10-CM

## 2022-12-31 DIAGNOSIS — Z794 Long term (current) use of insulin: Secondary | ICD-10-CM | POA: Insufficient documentation

## 2022-12-31 DIAGNOSIS — R7309 Other abnormal glucose: Secondary | ICD-10-CM

## 2022-12-31 DIAGNOSIS — Z01818 Encounter for other preprocedural examination: Secondary | ICD-10-CM | POA: Diagnosis not present

## 2022-12-31 DIAGNOSIS — R9431 Abnormal electrocardiogram [ECG] [EKG]: Secondary | ICD-10-CM | POA: Insufficient documentation

## 2022-12-31 LAB — BASIC METABOLIC PANEL
Anion gap: 10 (ref 5–15)
BUN: 9 mg/dL (ref 6–20)
CO2: 28 mmol/L (ref 22–32)
Calcium: 9.7 mg/dL (ref 8.9–10.3)
Chloride: 103 mmol/L (ref 98–111)
Creatinine, Ser: 1.1 mg/dL (ref 0.61–1.24)
GFR, Estimated: >60 mL/min (ref >60)
Glucose, Bld: 119 mg/dL — ABNORMAL HIGH (ref 70–99)
Potassium: 3.6 mmol/L (ref 3.5–5.1)
Sodium: 141 mmol/L (ref 135–145)

## 2022-12-31 LAB — CBC
HCT: 47.3 % (ref 39.0–52.0)
Hemoglobin: 15.5 g/dL (ref 13.0–17.0)
MCH: 28.5 pg (ref 26.0–34.0)
MCHC: 32.8 g/dL (ref 30.0–36.0)
MCV: 87.1 fL (ref 80.0–100.0)
Platelets: 168 10*3/uL (ref 150–400)
RBC: 5.43 MIL/uL (ref 4.22–5.81)
RDW: 13.3 % (ref 11.5–15.5)
WBC: 6 10*3/uL (ref 4.0–10.5)
nRBC: 0 % (ref 0.0–0.2)

## 2022-12-31 NOTE — Patient Instructions (Signed)
Your procedure is scheduled on: Wednesday 01/07/23 Report to the Registration Desk on the 1st floor of the [facility]. To find out your arrival time, please call [phone] between 1PM - 3PM on: Tuesday 01/06/23 If your arrival time is 6:00 am, do not arrive before that time as the [facility] entrance doors do not open until 6:00 am.  REMEMBER: Instructions that are not followed completely may result in serious medical risk, up to and including death; or upon the discretion of your surgeon and anesthesiologist your surgery may need to be rescheduled.  Do not eat food after midnight the night before surgery.  No gum chewing or hard candies.  You may however, drink CLEAR liquids up to 2 hours before you are scheduled to arrive for your surgery. Do not drink anything within 2 hours of your scheduled arrival time.  Clear liquids include: - water   Do NOT drink anything that is not on this list.  Type 1 and Type 2 diabetics should only drink water.  One week prior to surgery: Stop Anti-inflammatories (NSAIDS) such as Advil, Aleve, Ibuprofen, Motrin, Naproxen, Naprosyn and Aspirin based products such as Excedrin, Goody's Powder, BC Powder. You may however, continue to take Tylenol if needed for pain up until the day of surgery.  Stop ANY OVER THE COUNTER supplements until after surgery.   Continue taking all prescribed medications with the exception of the following: N/A  **Follow guidelines for insulin and diabetes medications**  Decrease your bedtime insulin dose to 17 units Tuesday night 01/06/23. Do not take any insulin the morning of surgery Wednesday 01/07/23   TAKE ONLY THESE MEDICATIONS THE MORNING OF SURGERY WITH A SIP OF WATER:  amLODipine (NORVASC) 5 MG tablet   No Alcohol for 24 hours before or after surgery.  No Smoking including e-cigarettes for 24 hours before surgery.  No chewable tobacco products for at least 6 hours before surgery.  No nicotine patches on the day  of surgery.  Do not use any "recreational" drugs for at least a week (preferably 2 weeks) before your surgery.  Please be advised that the combination of cocaine and anesthesia may have negative outcomes, up to and including death. If you test positive for cocaine, your surgery will be cancelled.  On the morning of surgery brush your teeth with toothpaste and water, you may rinse your mouth with mouthwash if you wish. Do not swallow any toothpaste or mouthwash.  Use CHG Soap or wipes as directed on instruction sheet.  Do not wear lotions, powders, or perfumes.  Do not shave body hair from the neck down 48 hours before surgery.  Do not wear jewelry, make-up, hairpins, clips or nail polish.  Wear comfortable clothing (specific to your surgery type) to the hospital.  Contact lenses, hearing aids and dentures may not be worn into surgery.  Do not bring valuables to the hospital. [facility] is not responsible for any missing/lost belongings or valuables.   Notify your doctor if there is any change in your medical condition (cold, fever, infection).  If you are being discharged the day of surgery, you will not be allowed to drive home. You will need a responsible individual to drive you home and stay with you for 24 hours after surgery.   If you are taking public transportation, you will need to have a responsible individual with you.  After surgery, you can help prevent lung complications by doing breathing exercises.  Take deep breaths and cough every 1-2  hours. Your doctor may order a device called an Incentive Spirometer to help you take deep breaths. When coughing or sneezing, hold a pillow firmly against your incision with both hands. This is called "splinting." Doing this helps protect your incision. It also decreases belly discomfort.  If you are being admitted to the hospital overnight, leave your suitcase in the car. After surgery it may be brought to your room.  In case of  increased patient census, it may be necessary for you, the patient, to continue your postoperative care in the Same Day Surgery department.  Surgery Visitation Policy:  Patients undergoing a surgery or procedure may have two family members or support persons with them as long as the person is not COVID-19 positive or experiencing its symptoms.   Inpatient Visitation:    Visiting hours are 7 a.m. to 8 p.m. Up to four visitors are allowed at one time in a patient room. The visitors may rotate out with other people during the day. One designated support person (adult) may remain overnight.  Due to an increase in RSV and influenza rates and associated hospitalizations, children ages 35 and under will not be able to visit patients in [facility]. Masks continue to be strongly recommended.  Please call the [facility] Dept. at [phone] if you have any questions about these instructions.

## 2023-01-01 ENCOUNTER — Other Ambulatory Visit: Payer: Self-pay

## 2023-01-01 ENCOUNTER — Encounter: Payer: Self-pay | Admitting: Surgery

## 2023-01-01 LAB — HEMOGLOBIN A1C
Hgb A1c MFr Bld: 15.2 % — ABNORMAL HIGH (ref 4.8–5.6)
Mean Plasma Glucose: 390 mg/dL

## 2023-01-01 NOTE — Progress Notes (Signed)
  Perioperative Services: Pre-Admission/Anesthesia Testing  Abnormal Lab Notification    Date: 01/01/23  Name: Luke [name] MRN:   [id number]  Re: Abnormal labs noted during PAT appointment   Provider(s) Notified: Jules Husbands, MD  Notification mode: Routed and/or faxed via [facility]   ABNORMAL LAB VALUE(S): Lab Results  Component Value Date   GLUCOSE 119 (H) 12/31/2022   Notes: Patient with a T2DM diagnosis. He is currently on parenteral therapies using both lispro and glargine insulins. Last Hgb A1c was elevated back on 07/16/2022; result 12.5% at that time. A1c repeated prior to elective upcoming surgery and found to be even higher at 15.2%. In efforts to reduce the risk of developing SSI, or other potential perioperative complications, this communication is being sent in order to determine if patient is deemed to have adequate medical optimization, including preoperative glycemic control.   With that being said, the benefit of improving glycemic control must be weighed against the overall risk associated with delaying a necessary elective surgery for this patient. Of note, patient presented to [facility] urgent care a few days ago requesting refills on his maintenance medications. Insulins were refilled by urgent care provider. While there, patient made mention of the fact that he does not currently have a PCP. He recently was approved for Medicaid un der the new expansion program. He will be establishing care in April with a provider. Patient previously seen by the [facility], however now that he has Medicaid, he can no longer be seen there.   Honor Loh, MSN, APRN, FNP-C, CEN [facility]  Peri-operative Services Nurse Practitioner Phone: [phone] 01/01/23 1:03 PM

## 2023-01-07 ENCOUNTER — Other Ambulatory Visit: Payer: Self-pay

## 2023-01-07 ENCOUNTER — Ambulatory Visit: Payer: Medicaid Other | Admitting: Urgent Care

## 2023-01-07 ENCOUNTER — Encounter: Payer: Self-pay | Admitting: Surgery

## 2023-01-07 ENCOUNTER — Ambulatory Visit
Admission: RE | Admit: 2023-01-07 | Discharge: 2023-01-07 | Disposition: A | Discharge status: Home / Self Care | Payer: Medicaid Other | Attending: Surgery | Admitting: Surgery

## 2023-01-07 ENCOUNTER — Encounter
Admission: RE | Disposition: A | Discharge status: Home / Self Care | Payer: Self-pay | Source: Home / Self Care | Attending: Surgery

## 2023-01-07 DIAGNOSIS — K4090 Unilateral inguinal hernia, without obstruction or gangrene, not specified as recurrent: Secondary | ICD-10-CM | POA: Diagnosis not present

## 2023-01-07 DIAGNOSIS — Z87891 Personal history of nicotine dependence: Secondary | ICD-10-CM | POA: Insufficient documentation

## 2023-01-07 DIAGNOSIS — Z9049 Acquired absence of other specified parts of digestive tract: Secondary | ICD-10-CM | POA: Diagnosis not present

## 2023-01-07 DIAGNOSIS — I10 Essential (primary) hypertension: Secondary | ICD-10-CM | POA: Diagnosis not present

## 2023-01-07 DIAGNOSIS — N189 Chronic kidney disease, unspecified: Secondary | ICD-10-CM | POA: Insufficient documentation

## 2023-01-07 DIAGNOSIS — K439 Ventral hernia without obstruction or gangrene: Secondary | ICD-10-CM | POA: Diagnosis not present

## 2023-01-07 DIAGNOSIS — I129 Hypertensive chronic kidney disease with stage 1 through stage 4 chronic kidney disease, or unspecified chronic kidney disease: Secondary | ICD-10-CM | POA: Diagnosis not present

## 2023-01-07 DIAGNOSIS — Z794 Long term (current) use of insulin: Secondary | ICD-10-CM | POA: Insufficient documentation

## 2023-01-07 DIAGNOSIS — E1122 Type 2 diabetes mellitus with diabetic chronic kidney disease: Secondary | ICD-10-CM | POA: Diagnosis not present

## 2023-01-07 DIAGNOSIS — E1165 Type 2 diabetes mellitus with hyperglycemia: Secondary | ICD-10-CM

## 2023-01-07 DIAGNOSIS — K219 Gastro-esophageal reflux disease without esophagitis: Secondary | ICD-10-CM | POA: Diagnosis not present

## 2023-01-07 DIAGNOSIS — G4730 Sleep apnea, unspecified: Secondary | ICD-10-CM | POA: Insufficient documentation

## 2023-01-07 HISTORY — PX: XI ROBOTIC ASSISTED INGUINAL HERNIA REPAIR WITH MESH: SHX6706

## 2023-01-07 HISTORY — DX: Type 2 diabetes mellitus without complications: Z79.4

## 2023-01-07 HISTORY — DX: Type 2 diabetes mellitus without complications: E11.9

## 2023-01-07 LAB — GLUCOSE, CAPILLARY
Glucose-Capillary: 290 mg/dL — ABNORMAL HIGH (ref 70–99)
Glucose-Capillary: 194 mg/dL — ABNORMAL HIGH (ref 70–99)

## 2023-01-07 SURGERY — REPAIR, HERNIA, INGUINAL, ROBOT-ASSISTED, LAPAROSCOPIC, USING MESH
Anesthesia: General | Site: Abdomen | Laterality: Right

## 2023-01-07 MED ORDER — ROCURONIUM BROMIDE 100 MG/10ML IV SOLN
INTRAVENOUS | Status: DC | PRN
  Administered 2023-01-07: 50 mg via INTRAVENOUS
  Administered 2023-01-07: 20 mg via INTRAVENOUS

## 2023-01-07 MED ORDER — CEFAZOLIN SODIUM-DEXTROSE 2-4 GM/100ML-% IV SOLN
INTRAVENOUS | Status: AC
  Filled 2023-01-07: qty 100

## 2023-01-07 MED ORDER — GABAPENTIN 300 MG PO CAPS
ORAL_CAPSULE | ORAL | Status: AC
  Administered 2023-01-07: 300 mg via ORAL
  Filled 2023-01-07: qty 1

## 2023-01-07 MED ORDER — CHLORHEXIDINE GLUCONATE 0.12 % MT SOLN
OROMUCOSAL | Status: AC
  Administered 2023-01-07: 15 mL via OROMUCOSAL
  Filled 2023-01-07: qty 15

## 2023-01-07 MED ORDER — BUPIVACAINE-EPINEPHRINE 0.25% -1:200000 IJ SOLN
INTRAMUSCULAR | Status: DC | PRN
  Administered 2023-01-07: 50 mL via INTRAMUSCULAR

## 2023-01-07 MED ORDER — GLYCOPYRROLATE 0.2 MG/ML IJ SOLN
INTRAMUSCULAR | Status: DC | PRN
  Administered 2023-01-07: .1 mg via INTRAVENOUS

## 2023-01-07 MED ORDER — CEFAZOLIN SODIUM-DEXTROSE 2-4 GM/100ML-% IV SOLN
2.0000 g | INTRAVENOUS | Status: AC
  Administered 2023-01-07: 2 g via INTRAVENOUS

## 2023-01-07 MED ORDER — OXYCODONE HCL 5 MG PO TABS: 5.0000 mg | ORAL_TABLET | Freq: Once | ORAL | Status: DC | PRN

## 2023-01-07 MED ORDER — CALCIUM CHLORIDE 10 % IV SOLN
INTRAVENOUS | Status: DC | PRN
  Administered 2023-01-07 (×3): 200 mg via INTRAVENOUS

## 2023-01-07 MED ORDER — HYDROCODONE-ACETAMINOPHEN 5-325 MG PO TABS: 1.0000 | ORAL_TABLET | ORAL | 0 refills | Status: DC | PRN

## 2023-01-07 MED ORDER — CHLORHEXIDINE GLUCONATE CLOTH 2 % EX PADS
6.0000 | MEDICATED_PAD | Freq: Once | CUTANEOUS | Status: AC
  Administered 2023-01-07: 6 via TOPICAL

## 2023-01-07 MED ORDER — INSULIN ASPART 100 UNIT/ML IJ SOLN: 10.0000 [IU] | Freq: Once | INTRAMUSCULAR | Status: AC

## 2023-01-07 MED ORDER — ACETAMINOPHEN 500 MG PO TABS: 1000.0000 mg | ORAL_TABLET | ORAL | Status: AC

## 2023-01-07 MED ORDER — GABAPENTIN 300 MG PO CAPS: 300.0000 mg | ORAL_CAPSULE | ORAL | Status: AC

## 2023-01-07 MED ORDER — CALCIUM CHLORIDE 10 % IV SOLN
INTRAVENOUS | Status: AC
  Filled 2023-01-07: qty 10

## 2023-01-07 MED ORDER — OXYCODONE HCL 5 MG/5ML PO SOLN: 5.0000 mg | Freq: Once | ORAL | Status: DC | PRN

## 2023-01-07 MED ORDER — HYDROMORPHONE HCL 1 MG/ML IJ SOLN
INTRAMUSCULAR | Status: AC
  Filled 2023-01-07: qty 1

## 2023-01-07 MED ORDER — ACETAMINOPHEN 10 MG/ML IV SOLN
INTRAVENOUS | Status: AC
  Filled 2023-01-07: qty 100

## 2023-01-07 MED ORDER — MIDAZOLAM HCL 2 MG/2ML IJ SOLN
INTRAMUSCULAR | Status: AC
  Filled 2023-01-07: qty 2

## 2023-01-07 MED ORDER — FENTANYL CITRATE (PF) 100 MCG/2ML IJ SOLN
INTRAMUSCULAR | Status: DC | PRN
  Administered 2023-01-07: 100 ug via INTRAVENOUS

## 2023-01-07 MED ORDER — HYDROMORPHONE HCL 1 MG/ML IJ SOLN
INTRAMUSCULAR | Status: DC | PRN
  Administered 2023-01-07 (×2): .5 mg via INTRAVENOUS

## 2023-01-07 MED ORDER — ORAL CARE MOUTH RINSE: 15.0000 mL | Freq: Once | OROMUCOSAL | Status: AC

## 2023-01-07 MED ORDER — 0.9 % SODIUM CHLORIDE (POUR BTL) OPTIME
TOPICAL | Status: DC | PRN
  Administered 2023-01-07: 500 mL

## 2023-01-07 MED ORDER — PROPOFOL 10 MG/ML IV BOLUS
INTRAVENOUS | Status: DC | PRN
  Administered 2023-01-07: 150 mg via INTRAVENOUS

## 2023-01-07 MED ORDER — EPHEDRINE SULFATE (PRESSORS) 50 MG/ML IJ SOLN
INTRAMUSCULAR | Status: DC | PRN
  Administered 2023-01-07 (×3): 5 mg via INTRAVENOUS

## 2023-01-07 MED ORDER — PHENYLEPHRINE HCL-NACL 20-0.9 MG/250ML-% IV SOLN
INTRAVENOUS | Status: AC
  Filled 2023-01-07: qty 250

## 2023-01-07 MED ORDER — CELECOXIB 200 MG PO CAPS: 200.0000 mg | ORAL_CAPSULE | ORAL | Status: AC

## 2023-01-07 MED ORDER — LACTATED RINGERS IV SOLN: INTRAVENOUS | Status: DC

## 2023-01-07 MED ORDER — BUPIVACAINE HCL (PF) 0.25 % IJ SOLN
INTRAMUSCULAR | Status: AC
  Filled 2023-01-07: qty 30

## 2023-01-07 MED ORDER — LACTATED RINGERS IV SOLN: INTRAVENOUS | Status: DC | PRN

## 2023-01-07 MED ORDER — PROPOFOL 1000 MG/100ML IV EMUL
INTRAVENOUS | Status: AC
  Filled 2023-01-07: qty 100

## 2023-01-07 MED ORDER — FAMOTIDINE 20 MG PO TABS: 20.0000 mg | ORAL_TABLET | Freq: Once | ORAL | Status: AC

## 2023-01-07 MED ORDER — MIDAZOLAM HCL 2 MG/2ML IJ SOLN
INTRAMUSCULAR | Status: DC | PRN
  Administered 2023-01-07: 2 mg via INTRAVENOUS

## 2023-01-07 MED ORDER — FAMOTIDINE 20 MG PO TABS
ORAL_TABLET | ORAL | Status: AC
  Administered 2023-01-07: 20 mg via ORAL
  Filled 2023-01-07: qty 1

## 2023-01-07 MED ORDER — PROPOFOL 10 MG/ML IV BOLUS
INTRAVENOUS | Status: AC
  Filled 2023-01-07: qty 40

## 2023-01-07 MED ORDER — HYDROMORPHONE HCL 1 MG/ML IJ SOLN: 0.2500 mg | INTRAMUSCULAR | Status: DC | PRN

## 2023-01-07 MED ORDER — FENTANYL CITRATE (PF) 100 MCG/2ML IJ SOLN
INTRAMUSCULAR | Status: AC
  Filled 2023-01-07: qty 2

## 2023-01-07 MED ORDER — ACETAMINOPHEN 500 MG PO TABS
ORAL_TABLET | ORAL | Status: AC
  Administered 2023-01-07: 1000 mg via ORAL
  Filled 2023-01-07: qty 2

## 2023-01-07 MED ORDER — LIDOCAINE HCL (CARDIAC) PF 100 MG/5ML IV SOSY
PREFILLED_SYRINGE | INTRAVENOUS | Status: DC | PRN
  Administered 2023-01-07: 80 mg via INTRAVENOUS

## 2023-01-07 MED ORDER — INSULIN ASPART 100 UNIT/ML IJ SOLN
INTRAMUSCULAR | Status: AC
  Administered 2023-01-07: 10 [IU] via SUBCUTANEOUS
  Filled 2023-01-07: qty 1

## 2023-01-07 MED ORDER — DEXAMETHASONE SODIUM PHOSPHATE 10 MG/ML IJ SOLN
INTRAMUSCULAR | Status: DC | PRN
  Administered 2023-01-07: 10 mg via INTRAVENOUS

## 2023-01-07 MED ORDER — CELECOXIB 200 MG PO CAPS
ORAL_CAPSULE | ORAL | Status: AC
  Administered 2023-01-07: 200 mg via ORAL
  Filled 2023-01-07: qty 1

## 2023-01-07 MED ORDER — BUPIVACAINE LIPOSOME 1.3 % IJ SUSP
INTRAMUSCULAR | Status: AC
  Filled 2023-01-07: qty 20

## 2023-01-07 MED ORDER — SUGAMMADEX SODIUM 200 MG/2ML IV SOLN
INTRAVENOUS | Status: DC | PRN
  Administered 2023-01-07: 200 mg via INTRAVENOUS

## 2023-01-07 MED ORDER — EPINEPHRINE PF 1 MG/ML IJ SOLN
INTRAMUSCULAR | Status: AC
  Filled 2023-01-07: qty 1

## 2023-01-07 MED ORDER — CHLORHEXIDINE GLUCONATE 0.12 % MT SOLN: 15.0000 mL | Freq: Once | OROMUCOSAL | Status: AC

## 2023-01-07 MED ORDER — ONDANSETRON HCL 4 MG/2ML IJ SOLN
INTRAMUSCULAR | Status: DC | PRN
  Administered 2023-01-07: 4 mg via INTRAVENOUS

## 2023-01-07 SURGICAL SUPPLY — 72 items
ADH SKN CLS APL DERMABOND .7 (GAUZE/BANDAGES/DRESSINGS) ×2
APL PRP STRL LF DISP 70% ISPRP (MISCELLANEOUS)
APPLIER CLIP 11 MED OPEN (CLIP)
APPLIER CLIP 13 LRG OPEN (CLIP)
APR CLP LRG 13 20 CLIP (CLIP)
APR CLP MED 11 20 MLT OPN (CLIP)
BLADE CLIPPER SURG (BLADE) ×2 IMPLANT
CANNULA REDUC XI 12-8 STAPL (CANNULA) ×2
CANNULA REDUCER 12-8 DVNC XI (CANNULA) ×2 IMPLANT
CHLORAPREP W/TINT 26 (MISCELLANEOUS) ×1 IMPLANT
CLIP APPLIE 11 MED OPEN (CLIP) ×1 IMPLANT
CLIP APPLIE 13 LRG OPEN (CLIP) ×1 IMPLANT
COVER TIP SHEARS 8 DVNC (MISCELLANEOUS) ×2 IMPLANT
COVER TIP SHEARS 8MM DA VINCI (MISCELLANEOUS) ×2
COVER WAND RF STERILE (DRAPES) ×2 IMPLANT
DERMABOND ADVANCED .7 DNX12 (GAUZE/BANDAGES/DRESSINGS) ×2 IMPLANT
DRAPE ARM DVNC X/XI (DISPOSABLE) ×6 IMPLANT
DRAPE COLUMN DVNC XI (DISPOSABLE) ×2 IMPLANT
DRAPE DA VINCI XI ARM (DISPOSABLE) ×6
DRAPE DA VINCI XI COLUMN (DISPOSABLE) ×2
DRAPE LAPAROTOMY 100X77 ABD (DRAPES) ×1 IMPLANT
DRSG TELFA 3X8 NADH STRL (GAUZE/BANDAGES/DRESSINGS) ×1 IMPLANT
ELECT CAUTERY BLADE 6.4 (BLADE) ×2 IMPLANT
ELECT REM PT RETURN 9FT ADLT (ELECTROSURGICAL) ×2
ELECTRODE REM PT RTRN 9FT ADLT (ELECTROSURGICAL) ×2 IMPLANT
GAUZE 4X4 16PLY ~~LOC~~+RFID DBL (SPONGE) ×1 IMPLANT
GAUZE SPONGE 4X4 12PLY STRL (GAUZE/BANDAGES/DRESSINGS) ×1 IMPLANT
GLOVE BIO SURGEON STRL SZ7 (GLOVE) ×8 IMPLANT
GOWN STRL REUS W/ TWL LRG LVL3 (GOWN DISPOSABLE) ×8 IMPLANT
GOWN STRL REUS W/TWL LRG LVL3 (GOWN DISPOSABLE) ×8
IRRIGATION STRYKERFLOW (MISCELLANEOUS) ×1 IMPLANT
IRRIGATOR STRYKERFLOW (MISCELLANEOUS)
IV NS 1000ML (IV SOLUTION)
IV NS 1000ML BAXH (IV SOLUTION) IMPLANT
KIT PINK PAD W/HEAD ARE REST (MISCELLANEOUS) ×2
KIT PINK PAD W/HEAD ARM REST (MISCELLANEOUS) ×2 IMPLANT
LABEL OR SOLS (LABEL) ×2 IMPLANT
MANIFOLD NEPTUNE II (INSTRUMENTS) ×1 IMPLANT
MESH 3DMAX 5X7 RT XLRG (Mesh General) ×1 IMPLANT
NDL HYPO 25X1 1.5 SAFETY (NEEDLE) ×1 IMPLANT
NEEDLE HYPO 22GX1.5 SAFETY (NEEDLE) ×1 IMPLANT
NEEDLE HYPO 25X1 1.5 SAFETY (NEEDLE) ×2 IMPLANT
OBTURATOR OPTICAL STANDARD 8MM (TROCAR) ×2
OBTURATOR OPTICAL STND 8 DVNC (TROCAR) ×2
OBTURATOR OPTICALSTD 8 DVNC (TROCAR) ×2 IMPLANT
PACK BASIN MINOR ARMC (MISCELLANEOUS) ×2 IMPLANT
PACK LAP CHOLECYSTECTOMY (MISCELLANEOUS) ×2 IMPLANT
PENCIL SMOKE EVACUATOR (MISCELLANEOUS) ×2 IMPLANT
SEAL CANN UNIV 5-8 DVNC XI (MISCELLANEOUS) ×4 IMPLANT
SEAL XI 5MM-8MM UNIVERSAL (MISCELLANEOUS) ×4
SET TUBE SMOKE EVAC HIGH FLOW (TUBING) ×2 IMPLANT
SOL ELECTROSURG ANTI STICK (MISCELLANEOUS) ×2
SOLUTION ELECTROSURG ANTI STCK (MISCELLANEOUS) ×2 IMPLANT
SPONGE T-LAP 18X18 ~~LOC~~+RFID (SPONGE) ×2 IMPLANT
STAPLER CANNULA SEAL DVNC XI (STAPLE) ×2 IMPLANT
STAPLER CANNULA SEAL XI (STAPLE) ×2
STAPLER SKIN PROX 35W (STAPLE) ×1 IMPLANT
SUT ETHIBOND 0 MO6 C/R (SUTURE) ×1 IMPLANT
SUT MNCRL AB 4-0 PS2 18 (SUTURE) ×3 IMPLANT
SUT V-LOC 90 ABS 3-0 VLT  V-20 (SUTURE)
SUT V-LOC 90 ABS 3-0 VLT V-20 (SUTURE) ×2 IMPLANT
SUT VIC AB 2-0 SH 27 (SUTURE) ×2
SUT VIC AB 2-0 SH 27XBRD (SUTURE) ×3 IMPLANT
SUT VICRYL 0 AB UR-6 (SUTURE) ×2 IMPLANT
SUT VICRYL 0 UR6 27IN ABS (SUTURE) ×2 IMPLANT
SYR 20ML LL LF (SYRINGE) ×2 IMPLANT
SYR 30ML LL (SYRINGE) ×2 IMPLANT
TAPE MICROFOAM 4IN (TAPE) ×1 IMPLANT
TAPE TRANSPORE STRL 2 31045 (GAUZE/BANDAGES/DRESSINGS) ×1 IMPLANT
TRAP FLUID SMOKE EVACUATOR (MISCELLANEOUS) ×1 IMPLANT
WATER STERILE IRR 1000ML POUR (IV SOLUTION) ×1 IMPLANT
WATER STERILE IRR 500ML POUR (IV SOLUTION) ×1 IMPLANT

## 2023-01-07 NOTE — Anesthesia Procedure Notes (Signed)
Procedure Name: Intubation Date/Time: 01/07/2023 7:51 AM  Performed by: Otho Perl, CRNAPre-anesthesia Checklist: Patient identified, Patient being monitored, Timeout performed, Emergency Drugs available and Suction available Patient Re-evaluated:Patient Re-evaluated prior to induction Oxygen Delivery Method: Circle system utilized Preoxygenation: Pre-oxygenation with 100% oxygen Induction Type: IV induction Ventilation: Mask ventilation without difficulty Laryngoscope Size: McGraph and 4 Grade View: Grade II Tube type: Oral Tube size: 7.5 mm Number of attempts: 1 Airway Equipment and Method: Stylet Placement Confirmation: ETT inserted through vocal cords under direct vision, positive ETCO2 and breath sounds checked- equal and bilateral Secured at: 24 cm Tube secured with: Tape Dental Injury: Teeth and Oropharynx as per pre-operative assessment

## 2023-01-07 NOTE — H&P (Signed)
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  He did develop diverticulitis with abscess of sigmoid. I have recommended elective sigmoid colectomy in the past unfortunately he developed some hematuria that required further work-up.     He currently denies any left lower quadrant pain or any other acute exacerbations of diverticulitis however he does report significant symptoms related to  right inguinal hernia.   He has a kown symptomatic Inguinal hernia as well, he has been scheduled for repair but has cancelled multiple times. He experiences intermittent groin pain that is moderate intensity sharp and worsening with Valsalva.  He did have a CT scan that have personally reviewed showing evidence of a right inguinal hernia.  No definitive evidence of ventral hernia.  He endorses pain to the right of the midline       Past Medical History:  Diagnosis Date   Anemia     Cecum mass 08/15/2018    a.) 4.8 cm pedunculated cecal mass; Bx (+) for villous adeoma. b.) s/p RIGHT colectomy.   Chronic kidney disease     Colonic neoplasm     Diabetes mellitus without complication ([place])     Diverticulosis of large intestine without diverticulitis     GERD (gastroesophageal reflux disease)     Hypertension     Pneumonia 09/07/2016   Sleep apnea     Stomach irritation             Past Surgical History:  Procedure Laterality Date   COLON SURGERY       COLONOSCOPY WITH PROPOFOL N/A 07/28/2018    Procedure: COLONOSCOPY WITH PROPOFOL;  Surgeon: Virgel Manifold, MD;  Location: [facility];  Service: Endoscopy;  Laterality: N/A;   COLONOSCOPY WITH PROPOFOL N/A 10/18/2019    Procedure: COLONOSCOPY WITH PROPOFOL;  Surgeon: Virgel Manifold, MD;  Location: [facility];  Service: Endoscopy;  Laterality: N/A;   COLONOSCOPY WITH PROPOFOL N/A 02/17/2021    Procedure: COLONOSCOPY WITH PROPOFOL;   Surgeon: Virgel Manifold, MD;  Location: [facility];  Service: Endoscopy;  Laterality: N/A;   ESOPHAGOGASTRODUODENOSCOPY (EGD) WITH PROPOFOL N/A 07/28/2018    Procedure: ESOPHAGOGASTRODUODENOSCOPY (EGD) WITH PROPOFOL;  Surgeon: Virgel Manifold, MD;  Location: [facility];  Service: Endoscopy;  Laterality: N/A;   PARTIAL COLECTOMY Right 08/15/2018    Procedure: RIGHT  COLECTOMY;  Surgeon: Vickie Epley, MD;  Location: [facility];  Service: General;  Laterality: Right;           Family History  Problem Relation Age of Onset   CAD Maternal Grandmother     Breast cancer Maternal Grandmother     Breast cancer Mother     Hypertension Mother     Hypertension Father     Colon cancer Paternal Uncle     Throat cancer Maternal Grandfather     Diabetes Paternal Grandmother        Social History:  reports that he quit smoking about 2 years ago. His smoking use included cigarettes. He smoked an average of .1 packs per day. He has never used smokeless tobacco. He reports current alcohol use. He reports that he does not currently use drugs after having used the following drugs: Marijuana.   Allergies: No Known Allergies   Medications reviewed.       ROS Full ROS performed and is otherwise negative other than what is stated in HPI  Physical  Exam Vitals and nursing note reviewed. Exam conducted with a chaperone present.  Constitutional:      General: He is not in acute distress.    Appearance: Normal appearance. He is not toxic-appearing.  Eyes:     General: No scleral icterus.       Right eye: No discharge.        Left eye: No discharge.  Cardiovascular:     Rate and Rhythm: Normal rate and regular rhythm.     Heart sounds: No murmur heard. Pulmonary:     Effort: Pulmonary effort is normal. No respiratory distress.     Breath sounds: Normal breath sounds. No stridor.  Abdominal:     General: Abdomen is flat. There is no distension.     Palpations: There is no mass.      Tenderness: There is no abdominal tenderness. There is no guarding or rebound.     Hernia: A hernia is present.     Comments: Reducible but tender RIH  Musculoskeletal:        General: No swelling. Normal range of motion.     Cervical back: Normal range of motion and neck supple. No rigidity or tenderness.  Skin:    General: Skin is warm and dry.     Capillary Refill: Capillary refill takes less than 2 seconds.     Coloration: Skin is not jaundiced.  Neurological:     General: No focal deficit present.     Mental Status: He is alert and oriented to person, place, and time.  Psychiatric:        Mood and Affect: Mood normal.        Behavior: Behavior normal.        Thought Content: Thought content normal.        Judgment: Judgment normal.        Assessment/Plan:  42 year old male with symptomatic right inguinal hernia  w crescendo symptoms in need for repair.  He does have elevated sugars but has started on insulin and now has a decent regimen. D/W the pt in detail about options of postponing the surgery until BS are better control. He seems to have worsening sxs and crescendo sxs. Difficult situation. Pt understands that he has increase perioperative risks, to include infection , benefits and possible complications. He is well aware Procedure discussed with the patient in detail.  Risks, benefits and possible complications including but not limited to, bleeding, infection, chronic pain, mesh issues, recurrence.  He understands and wishes to proceed.

## 2023-01-07 NOTE — Anesthesia Postprocedure Evaluation (Addendum)
Anesthesia Post Note  Patient: LENNIE GRAEFF  Procedure(s) Performed: XI ROBOTIC ASSISTED INGUINAL HERNIA REPAIR WITH MESH, poss Bilateral (Right: Abdomen)  Patient location during evaluation: PACU Anesthesia Type: General Level of consciousness: awake and alert Pain management: pain level controlled Vital Signs Assessment: post-procedure vital signs reviewed and stable Respiratory status: spontaneous breathing, nonlabored ventilation, respiratory function stable and patient connected to nasal cannula oxygen Cardiovascular status: blood pressure returned to baseline and stable Postop Assessment: no apparent nausea or vomiting Anesthetic complications: no   No notable events documented.   Last Vitals:  Vitals:   01/07/23 0624  BP: (!) 144/96  Pulse: 72  Resp: 17  Temp: (!) 36.3 C  SpO2: 97%    Last Pain:  Vitals:   01/07/23 0624  TempSrc: Temporal  PainSc: 0-No pain                 Ilene Qua

## 2023-01-07 NOTE — Anesthesia Preprocedure Evaluation (Signed)
Anesthesia Evaluation  Patient identified by MRN, date of birth, ID band Patient awake    Reviewed: Allergy & Precautions, NPO status , Patient's Chart, lab work & pertinent test results  History of Anesthesia Complications Negative for: history of anesthetic complications  Airway Mallampati: III  TM Distance: >3 FB Neck ROM: full    Dental no notable dental hx.    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 b.) s/p RIGHT colectomy. No date: Chronic kidney disease No date: Colonic neoplasm No date: Diverticulosis of large intestine without diverticulitis No date: GERD (gastroesophageal reflux disease) No date: Hypertension 09/07/2016: Pneumonia No date: Sleep apnea No date: Stomach irritation No date: Type 2 diabetes mellitus treated with insulin [facility])  Past Surgical History: No date: COLON SURGERY 07/28/2018: COLONOSCOPY WITH PROPOFOL; N/A     Comment:  Procedure: COLONOSCOPY WITH PROPOFOL;  Surgeon:               Virgel Manifold, MD;  Location: [facility];                Service: Endoscopy;  Laterality: N/A; 10/18/2019: COLONOSCOPY WITH PROPOFOL; N/A     Comment:  Procedure: COLONOSCOPY WITH PROPOFOL;  Surgeon:               Virgel Manifold, MD;  Location: [facility];                Service: Endoscopy;   Laterality: N/A; 02/17/2021: COLONOSCOPY WITH PROPOFOL; N/A     Comment:  Procedure: COLONOSCOPY WITH PROPOFOL;  Surgeon:               Virgel Manifold, MD;  Location: [facility];                Service: Endoscopy;  Laterality: N/A; 07/28/2018: ESOPHAGOGASTRODUODENOSCOPY (EGD) WITH PROPOFOL; N/A     Comment:  Procedure: ESOPHAGOGASTRODUODENOSCOPY (EGD) WITH               PROPOFOL;  Surgeon: Virgel Manifold, MD;  Location:               [facility];  Service: Endoscopy;  Laterality: N/A; 08/15/2018: PARTIAL COLECTOMY; Right     Comment:  Procedure: RIGHT  COLECTOMY;  Surgeon: Vickie Epley, MD;  Location: [facility];  Service: General;                Laterality: Right;     Reproductive/Obstetrics negative OB ROS                              Anesthesia Physical Anesthesia Plan  ASA: 4  Anesthesia Plan: General ETT  Post-op Pain Management: Tylenol PO (pre-op)*, Gabapentin PO (pre-op)*, Celebrex PO (pre-op)* and Dilaudid IV   Induction: Intravenous  PONV Risk Score and Plan: 2 and Ondansetron, Dexamethasone, Midazolam and Treatment may vary due to age or medical condition  Airway Management Planned: Oral ETT  Additional Equipment:   Intra-op Plan:   Post-operative Plan: Extubation in OR  Informed Consent: I have reviewed the patients History and Physical, chart, labs and discussed the procedure including the risks, benefits and alternatives for the proposed anesthesia with the patient or authorized representative who has indicated his/her understanding and acceptance.     Dental Advisory Given  Plan Discussed with: Anesthesiologist, CRNA and Surgeon  Anesthesia Plan Comments: (Patient consented for risks of anesthesia including but not limited to:  - adverse reactions to medications - damage to eyes, teeth, lips or other oral mucosa - nerve damage due to positioning  - sore throat or hoarseness - Damage to  heart, brain, nerves, lungs, other parts of body or loss of life  Patient voiced understanding.)         Anesthesia Quick Evaluation

## 2023-01-07 NOTE — Anesthesia Postprocedure Evaluation (Deleted)
Anesthesia Post Note  Patient: Luke [name]  Procedure(s) Performed: XI ROBOTIC ASSISTED INGUINAL HERNIA REPAIR WITH MESH, poss Bilateral (Right: Abdomen)  Anesthesia Type: General Anesthetic complications: no   No notable events documented.   Last Vitals:  Vitals:   01/07/23 1030 01/07/23 1044  BP: (!) 134/98 (!) 134/97  Pulse: 90 78  Resp: 17 16  Temp: (!) 36.3 C 36.9 C  SpO2: 97% 93%    Last Pain:  Vitals:   01/07/23 1044  TempSrc: Temporal  PainSc: 0-No pain                 Ilene Qua

## 2023-01-07 NOTE — Discharge Instructions (Addendum)
Laparoscopic Inguinal Hernia Repair, Adult, Care After The following information offers guidance on how to care for yourself after your procedure. Your health care provider may also give you more specific instructions. If you have problems or questions, contact your health care provider. What can I expect after the procedure? After the procedure, it is common to have: Pain. Swelling and bruising around the incision area. Scrotal swelling, in males. Some fluid or blood draining from your incisions. Follow these instructions at home: Medicines Take over-the-counter and prescription medicines only as told by your health care provider. Ask your health care provider if the medicine prescribed to you: Requires you to avoid driving or using machinery. Can cause constipation. You may need to take these actions to prevent or treat constipation: Drink enough fluid to keep your urine pale yellow. Take over-the-counter or prescription medicines. Eat foods that are high in fiber, such as beans, whole grains, and fresh fruits and vegetables. Limit foods that are high in fat and processed sugars, such as fried or sweet foods. Incision care  Follow instructions from your health care provider about how to take care of your incisions. Make sure you: Wash your hands with soap and water for at least 20 seconds before and after you change your bandage (dressing). If soap and water are not available, use hand sanitizer. Change your dressing as told by your health care provider. Leave stitches (sutures), skin glue, or adhesive strips in place. These skin closures may need to stay in place for 2 weeks or longer. If adhesive strip edges start to loosen and curl up, you may trim the loose edges. Do not remove adhesive strips completely unless your health care provider tells you to do that. Check your incision area every day for signs of infection. Check for: More redness, swelling, or pain. More fluid or  blood. Warmth. Pus or a bad smell. Wear loose, soft clothing while your incisions heal. Managing pain and swelling  If directed, put ice on the painful or swollen areas. To do this: Put ice in a plastic bag. Place a towel between your skin and the bag. Leave the ice on for 20 minutes, 2-3 times a day. Remove the ice if your skin turns bright red. This is very important. If you cannot feel pain, heat, or cold, you have a greater risk of damage to the area.   Activity Do not lift anything that is heavier than 10 lb (4.5 kg), or the limit that you are told, until your health care provider says that it is safe. Ask your health care provider what activities are safe for you. A lot of activity during the first week after surgery can increase pain and swelling. For 1 week after your procedure: Avoid activities that take a lot of effort, such as exercise or sports. You may walk and climb stairs as needed for daily activity, but avoid long walks or climbing stairs for exercise. General instructions If you were given a sedative during the procedure, it can affect you for several hours. Do not drive or operate machinery until your health care provider says that it is safe. Do not take baths, swim, or use a hot tub until your health care provider approves. Ask your health care provider if you may take showers. You may only be allowed to take sponge baths. Do not use any products that contain nicotine or tobacco. These products include cigarettes, chewing tobacco, and vaping devices, such as e-cigarettes. If you need help quitting, ask  your health care provider. Keep all follow-up visits. This is important. Contact a health care provider if: You have any of these signs of infection: More redness, swelling, or pain around your incisions or your groin area. More fluid or blood coming from an incision. Warmth coming from an incision. Pus or a bad smell coming from an incision. A fever or chills. You  have more swelling in your scrotum, if you are male. You have severe pain and medicines do not help. You have abdominal pain or swelling. You cannot urinate or have a bowel movement. You faint or feel dizzy. You have nausea and vomiting. Get help right away if: You have redness, warmth, or pain in your leg. You have chest pain. You have problems breathing. These symptoms may represent a serious problem that is an emergency. Do not wait to see if the symptoms will go away. Get medical help right away. Call your local emergency services (911 in the [place] Do not drive yourself to the hospital. Summary Pain, swelling, and bruising are common after the procedure. Check your incision area every day for signs of infection, such as more redness, swelling, or pain. Put ice on painful or swollen areas for 20 minutes, 2-3 times a day. This information is not intended to replace advice given to you by your health care provider. Make sure you discuss any questions you have with your health care provider. Document Revised: 06/18/2020 Document Reviewed: 06/18/2020 Elsevier Patient Education  [place]   The drugs that you were given will stay in your system until tomorrow so for the next 24 hours you should not:  Drive an automobile Make any legal decisions Drink any alcoholic beverage   You may resume regular meals tomorrow.  Today it is better to start with liquids and gradually work up to solid foods.  You may eat anything you prefer, but it is better to start with liquids, then soup and crackers, and gradually work up to solid foods.   Please notify your doctor immediately if you have any unusual bleeding, trouble breathing, redness and pain at the surgery site, drainage, fever, or pain not relieved by medication.     Additional Instructions:

## 2023-01-07 NOTE — Transfer of Care (Signed)
Immediate Anesthesia Transfer of Care Note  Patient: Luke [name]  Procedure(s) Performed: XI ROBOTIC ASSISTED INGUINAL HERNIA REPAIR WITH MESH, poss Bilateral (Right: Abdomen)  Patient Location: PACU  Anesthesia Type:General  Level of Consciousness: drowsy  Airway & Oxygen Therapy: Breathing stable, red oral ariway in, 10L on FM  Post-op Assessment: Report given to RN and Post -op Vital signs reviewed and stable  Post vital signs: Reviewed  Last Vitals:  Vitals Value Taken Time  BP 121/71 01/07/23 1004  Temp 36.7 C 01/07/23 1004  Pulse 86 01/07/23 1010  Resp 18 01/07/23 1010  SpO2 98 % 01/07/23 1010  Vitals shown include unvalidated device data.  Last Pain:  Vitals:   01/07/23 0624  TempSrc: Temporal  PainSc: 0-No pain         Complications: No notable events documented.

## 2023-01-07 NOTE — Op Note (Signed)
Robotic assisted Laparoscopic Transabdominal Right Inguinal Hernia Repair with 3 D Xlarge Mesh      Pre-operative Diagnosis:  Right Inguinal Hernia   Post-operative Diagnosis: Same   Procedure: Robotic  Laparoscopic  repair of Right inguinal hernias   Surgeon: Caroleen Hamman, MD FACS   Anesthesia: Gen. with endotracheal tube   Findings: Large Direct inguinal hernia, extensive adhesions from the omentum to the abdominal wall, no definitive evidence of ventral hernias   Procedure Details  The patient was seen again in the Holding Room. The benefits, complications, treatment options, and expected outcomes were discussed with the patient. The risks of bleeding, infection, recurrence of symptoms, failure to resolve symptoms, recurrence of hernia, ischemic orchitis, chronic pain syndrome or neuroma, were discussed again. The likelihood of improving the patient's symptoms with return to their baseline status is good.  The patient and/or family concurred with the proposed plan, giving informed consent.  The patient was taken to Operating Room, identified  and the procedure verified as Laparoscopic Inguinal Hernia Repair. Laterality confirmed.  A Time Out was held and the above information confirmed.   Prior to the induction of general anesthesia, antibiotic prophylaxis was administered. VTE prophylaxis was in place. General endotracheal anesthesia was then administered and tolerated well. After the induction, the abdomen was prepped with Chloraprep and draped in the sterile fashion. The patient was positioned in the supine position.     Supraumbilical incision created and cut down technique used to enter the abdominal cavity. Fascia elevated and incised and hasson trochar placed. Pneumoperitoneum obtained w/o HD changes. No evidence of bowel injuries.  Two 8 mm placed under direct vision. The laparoscopy revealed large Right direct defect and significant adhesions.    I inserted the needles and the  mesh under direct visualization. The robot was brought ot the table and docked in the standard fashion, no collision between arms was observed. Instruments were kept under direct view at all times. Robotically extensive lysis of adhesions performed sharply and where feasible with hot scissors. NO injuries were observed, I was very meticulous doing LOA and took [place] at least 45 minutes of total operative time to be able to have a good window of dissection.   We started on the right side where a flap was created.  The sac was reduced and dissected free from adjacent structures.  We preserved the vas and the vessels. We had to dissect our dissection to the left side to exclude a hernia on that side Space retzius dissected. Once dissection was completed a X-large 3D mesh was placed and secured with two interrupted vicryl attached to the pubic tubercle. I was very happy with the coverage. There was good coverage of the direct, indirect and femoral spaces. The flap was closed with v lock suture.  Second look revealed no complications or injuries.     Once assuring that hemostasis was adequate the ports were removed and a figure-of-eight 0 Vicryl suture was placed at the fascial edges. 4-0 subcuticular Monocryl was used at all skin edges. Dermabond was placed.  Patient tolerated the procedure well. There were no complications. He was taken to the recovery room in stable condition.  Mod 22 applied due to the extensive and complexity of the procedure.                Caroleen Hamman, MD, FACS

## 2023-01-08 ENCOUNTER — Emergency Department: Payer: Medicaid Other

## 2023-01-08 ENCOUNTER — Emergency Department
Admission: EM | Admit: 2023-01-08 | Discharge: 2023-01-08 | Disposition: A | Discharge status: Home / Self Care | Payer: Medicaid Other | Attending: Emergency Medicine | Admitting: Emergency Medicine

## 2023-01-08 ENCOUNTER — Other Ambulatory Visit: Payer: Self-pay

## 2023-01-08 ENCOUNTER — Encounter: Payer: Self-pay | Admitting: Emergency Medicine

## 2023-01-08 DIAGNOSIS — E1122 Type 2 diabetes mellitus with diabetic chronic kidney disease: Secondary | ICD-10-CM | POA: Insufficient documentation

## 2023-01-08 DIAGNOSIS — R0789 Other chest pain: Secondary | ICD-10-CM | POA: Insufficient documentation

## 2023-01-08 DIAGNOSIS — N189 Chronic kidney disease, unspecified: Secondary | ICD-10-CM | POA: Insufficient documentation

## 2023-01-08 DIAGNOSIS — R079 Chest pain, unspecified: Secondary | ICD-10-CM | POA: Diagnosis not present

## 2023-01-08 DIAGNOSIS — I129 Hypertensive chronic kidney disease with stage 1 through stage 4 chronic kidney disease, or unspecified chronic kidney disease: Secondary | ICD-10-CM | POA: Diagnosis not present

## 2023-01-08 LAB — BASIC METABOLIC PANEL
Anion gap: 12 (ref 5–15)
BUN: 15 mg/dL (ref 6–20)
CO2: 21 mmol/L — ABNORMAL LOW (ref 22–32)
Calcium: 9.5 mg/dL (ref 8.9–10.3)
Chloride: 99 mmol/L (ref 98–111)
Creatinine, Ser: 1.3 mg/dL — ABNORMAL HIGH (ref 0.61–1.24)
GFR, Estimated: >60 mL/min (ref >60)
Glucose, Bld: 337 mg/dL — ABNORMAL HIGH (ref 70–99)
Potassium: 4.1 mmol/L (ref 3.5–5.1)
Sodium: 132 mmol/L — ABNORMAL LOW (ref 135–145)

## 2023-01-08 LAB — TROPONIN I (HIGH SENSITIVITY)
Troponin I (High Sensitivity): 3 ng/L (ref <18)
Troponin I (High Sensitivity): 3 ng/L (ref <18)

## 2023-01-08 LAB — CBC
HCT: 51 % (ref 39.0–52.0)
Hemoglobin: 16.4 g/dL (ref 13.0–17.0)
MCH: 28 pg (ref 26.0–34.0)
MCHC: 32.2 g/dL (ref 30.0–36.0)
MCV: 87 fL (ref 80.0–100.0)
Platelets: 183 10*3/uL (ref 150–400)
RBC: 5.86 MIL/uL — ABNORMAL HIGH (ref 4.22–5.81)
RDW: 13.2 % (ref 11.5–15.5)
WBC: 10.9 10*3/uL — ABNORMAL HIGH (ref 4.0–10.5)
nRBC: 0 % (ref 0.0–0.2)

## 2023-01-08 NOTE — ED Provider Notes (Signed)
[facility] Provider Note    Event Date/Time   First MD Initiated Contact with Patient 01/08/23 [phone]     (approximate)   History   Chest Pain   HPI  Luke [name] is a 42 y.o. male who presents to the ED for evaluation of Chest Pain   I review op note from less than 24 hours ago where he had a elective robot-assisted laparoscopic repair of a right inguinal hernia.  No apparent complications. Otherwise history of a right-sided colectomy for cecal mass.  DM, CKD and HTN.  Patient presents to the ED for evaluation of intermittent chest pain for the past 1 week.  He reports he forgot to tell the surgeon or anesthesiologist about this around his surgery.  Reports intermittent pain and pressure substernally, sometimes associated with tingling to his left fingertips, but no other associated symptoms such as dyspnea, emesis, syncope, fever or cough.  Denies exertional pain and reports symptoms are often worse at night while laying in bed.   Physical Exam   Triage Vital Signs: ED Triage Vitals  Enc Vitals Group     BP 01/08/23 0305 (!) 161/112     Pulse Rate 01/08/23 0305 63     Resp 01/08/23 0305 18     Temp 01/08/23 0305 97.9 F (36.6 C)     Temp Source 01/08/23 0305 Oral     SpO2 01/08/23 0305 95 %     Weight 01/08/23 0304 190 lb (86.2 kg)     Height 01/08/23 0304 '5\' 8"'$  (1.727 m)     Head Circumference --      Peak Flow --      Pain Score 01/08/23 0304 10     Pain Loc --      Pain Edu? --      Excl. in [place]? --     Most recent vital signs: Vitals:   01/08/23 0430 01/08/23 0600  BP: (!) 128/94 (!) 141/96  Pulse: (!) 52 61  Resp: 17 18  Temp:  98.1 F (36.7 C)  SpO2: 95% 96%    General: Awake, no distress.  CV:  Good peripheral perfusion.  Resp:  Normal effort.  Abd:  No distention.  MSK:  No deformity noted.  Chest pain is reproducible on palpation.  No overlying skin changes or signs of trauma.  No rash. Neuro:  No focal deficits  appreciated. Other:     ED Results / Procedures / Treatments   Labs (all labs ordered are listed, but only abnormal results are displayed) Labs Reviewed  CBC - Abnormal; Notable for the following components:      Result Value   WBC 10.9 (*)    RBC 5.86 (*)    All other components within normal limits  BASIC METABOLIC PANEL - Abnormal; Notable for the following components:   Sodium 132 (*)    CO2 21 (*)    Glucose, Bld 337 (*)    Creatinine, Ser 1.30 (*)    All other components within normal limits  TROPONIN I (HIGH SENSITIVITY)  TROPONIN I (HIGH SENSITIVITY)    EKG Sinus rhythm with a rate of 60 bpm.  Normal axis and intervals.  Nonspecific ST changes laterally and inferiorly without clear signs of acute ischemia.  Comparison from 1 week ago is similar  RADIOLOGY CXR interpreted by me without evidence of acute cardiopulmonary pathology.  Official radiology report(s): DG Chest 2 View  Result Date: 01/08/2023 CLINICAL DATA:  Chest pain EXAM:  CHEST - 2 VIEW COMPARISON:  09/29/2018 FINDINGS: Cardiac shadows within normal limits. Lungs are well aerated bilaterally. Minimal air is noted beneath the right hemidiaphragm likely related to the known recent hernia repair. No bony abnormality is noted. IMPRESSION: No active cardiopulmonary disease. Electronically Signed   By: Inez Catalina M.D.   On: 01/08/2023 03:23    PROCEDURES and INTERVENTIONS:  .1-3 Lead EKG Interpretation  Performed by: Vladimir Crofts, MD Authorized by: Vladimir Crofts, MD     Interpretation: normal     ECG rate:  58   ECG rate assessment: normal     Rhythm: sinus rhythm     Ectopy: none     Conduction: normal     Medications - No data to display   IMPRESSION / MDM / [place] / ED COURSE  I reviewed the triage vital signs and the nursing notes.  Differential diagnosis includes, but is not limited to, ACS, PTX, PNA, muscle strain/spasm, PE, dissection  {Patient presents with symptoms of an  acute illness or injury that is potentially life-threatening.  42 year old male with some risk factors but no personal history of CAD presents with a week of atypical chest discomfort suitable for outpatient management with cardiology follow-up.  Look systemically well.  Does have some reproducible symptoms on exam.  Has a benign workup with a nonischemic EKG, clear CXR, negative troponin x 2.  Essentially normal CBC.  Metabolic panel with hyperglycemia without acidosis to suggest DKA or HHS.  I considered observation admission for this patient, but ultimately I think he is suitable for trial of outpatient management with referral to cardiology.  We discussed close return precautions for the ED.      FINAL CLINICAL IMPRESSION(S) / ED DIAGNOSES   Final diagnoses:  Other chest pain     Rx / DC Orders   ED Discharge Orders          Ordered    Ambulatory referral to Cardiology        01/08/23 0607             Note:  This document was prepared using Dragon voice recognition software and may include unintentional dictation errors.   Vladimir Crofts, MD 01/08/23 [phone]

## 2023-01-08 NOTE — ED Triage Notes (Signed)
Patient ambulatory to triage with steady gait, without difficulty or distress noted; pt reports mid CP nonradiating with no accomp symptoms since last week; st had hernia repair yesterday

## 2023-01-08 NOTE — Discharge Instructions (Addendum)
You will get a phone call from the clinic of the cardiologist to schedule an appointment  If you develop any further worsening symptoms, passing out her symptoms, difficulty breathing for symptoms then please return to the ED

## 2023-01-19 ENCOUNTER — Encounter: Payer: Self-pay | Admitting: Hematology and Oncology

## 2023-01-26 ENCOUNTER — Encounter: Payer: Medicaid Other | Admitting: Physician Assistant

## 2023-02-02 ENCOUNTER — Encounter: Payer: Self-pay | Admitting: Hematology and Oncology

## 2023-02-03 ENCOUNTER — Ambulatory Visit: Payer: Medicaid Other | Admitting: Nurse Practitioner

## 2023-02-03 NOTE — Progress Notes (Deleted)
Tomasita Morrow, NP-C Phone: [phone]  Luke [name] is a 42 y.o. male who presents today to establish care.   HYPERTENSION Disease Monitoring: Blood pressure range-*** Chest pain- ***      Dyspnea- *** Medications: Compliance- *** Lightheadedness- ***   Edema- ***  Lab Results  Component Value Date   NA 132 (L) 01/08/2023   K 4.1 01/08/2023   CO2 21 (L) 01/08/2023   GLUCOSE 337 (H) 01/08/2023   BUN 15 01/08/2023   CREATININE 1.30 (H) 01/08/2023   CALCIUM 9.5 01/08/2023   EGFR 88 03/19/2021   GFRNONAA >60 01/08/2023    DIABETES Disease Monitoring: Blood Sugar ranges-*** Polyuria/phagia/dipsia- ***      Optho- *** Medications: Compliance- *** Hypoglycemic symptoms- ***  Lab Results  Component Value Date   HGBA1C 15.2 (H) 12/31/2022     HYPERLIPIDEMIA Disease Monitoring: See symptoms for Hypertension Medications: Compliance- *** Right upper quadrant pain- ***  Muscle aches- ***  Lab Results  Component Value Date   CHOL 178 09/10/2021   HDL 24 (L) 09/10/2021   LDLCALC 71 09/10/2021   TRIG 532 (H) 09/10/2021   CHOLHDL 7.4 (H) 09/10/2021       Active Ambulatory Problems    Diagnosis Date Noted   Iron deficiency anemia 07/23/2018   B12 deficiency 07/23/2018   Columnar-lined esophagus    Gastric polyp    Hypertension 08/17/2018   Unilateral inguinal hernia, without obstruction or gangrene, not specified as recurrent 05/01/2019   Constipation 08/15/2019   History of inguinal hernia 08/15/2019   Acid reflux 08/15/2019   History of colonic polyps    Polyp of colon    Prediabetes 02/01/2020   Dyslipidemia (high LDL; low HDL) 02/01/2020   Type 2 diabetes mellitus without complications [id number]   Snores 03/12/2020   Type 2 diabetes mellitus with diabetic neuropathy, unspecified 04/04/2020   Diverticulitis of colon with perforation 08/20/2020   Abnormal laboratory test result 12/19/2020   Type 2 diabetes mellitus with hyperglycemia 12/26/2020   History  of sleep apnea 04/29/2021   Resolved Ambulatory Problems    Diagnosis Date Noted   Pneumonia 09/07/2016   GIB (gastrointestinal bleeding) 07/03/2018   Stomach irritation    Colonic neoplasm    Diverticulosis of large intestine without diverticulitis    Cecum mass    Sleep apnea, unspecified 03/12/2020   Past Medical History:  Diagnosis Date   Anemia    Chronic kidney disease    GERD (gastroesophageal reflux disease)    Sleep apnea    Type 2 diabetes mellitus treated with insulin ([place])     Family History  Problem Relation Age of Onset   CAD Maternal Grandmother    Breast cancer Maternal Grandmother    Breast cancer Mother    Hypertension Mother    Hypertension Father    Colon cancer Paternal Uncle    Throat cancer Maternal Grandfather    Diabetes Paternal Grandmother     Social History   Socioeconomic History   Marital status: Single    Spouse name: Not on file   Number of children: Not on file   Years of education: Not on file   Highest education level: Not on file  Occupational History   Not on file  Tobacco Use   Smoking status: Former    Packs/day: .1    Types: Cigarettes    Quit date: 03/25/2020    Years since quitting: 2.8   Smokeless tobacco: Never   Tobacco comments:    patient is  social smoker/ smokes when he drinks   Vaping Use   Vaping Use: Never used  Substance and Sexual Activity   Alcohol use: Yes    Alcohol/week: 2.0 standard drinks of alcohol    Types: 2 Shots of liquor per week   Drug use: Not Currently    Types: Marijuana    Comment: last use 2020   Sexual activity: Yes    Birth control/protection: Condom  Other Topics Concern   Not on file  Social History Narrative   Not on file   Social Determinants of Health   Financial Resource Strain: Not on file  Food Insecurity: No Food Insecurity (04/15/2022)   Hunger Vital Sign    Worried About Running Out of Food in the Last Year: Never true    Ran Out of Food in the Last Year: Never  true  Transportation Needs: No Transportation Needs (04/15/2022)   PRAPARE - Hydrologist (Medical): No    Lack of Transportation (Non-Medical): No  Physical Activity: Not on file  Stress: Not on file  Social Connections: Not on file  Intimate Partner Violence: Not on file    ROS  General:  Negative for nexplained weight loss, fever Skin: Negative for new or changing mole, sore that won't heal HEENT: Negative for trouble hearing, trouble seeing, ringing in ears, mouth sores, hoarseness, change in voice, dysphagia. CV:  Negative for chest pain, dyspnea, edema, palpitations Resp: Negative for cough, dyspnea, hemoptysis GI: Negative for nausea, vomiting, diarrhea, constipation, abdominal pain, melena, hematochezia. GU: Negative for dysuria, incontinence, urinary hesitance, hematuria, vaginal or penile discharge, polyuria, sexual difficulty, lumps in testicle or breasts MSK: Negative for muscle cramps or aches, joint pain or swelling Neuro: Negative for headaches, weakness, numbness, dizziness, passing out/fainting Psych: Negative for depression, anxiety, memory problems  Objective  Physical Exam There were no vitals filed for this visit.  BP Readings from Last 3 Encounters:  01/08/23 (!) 141/96  01/07/23 (!) 131/96  12/29/22 (!) 141/92   Wt Readings from Last 3 Encounters:  01/08/23 190 lb (86.2 kg)  12/31/22 190 lb (86.2 kg)  12/07/22 190 lb (86.2 kg)    Physical Exam   Assessment/Plan:   There are no diagnoses linked to this encounter.  No follow-ups on file.   Tomasita Morrow, NP-C [place]

## 2023-02-05 ENCOUNTER — Other Ambulatory Visit: Payer: Self-pay

## 2023-02-05 ENCOUNTER — Encounter: Payer: Self-pay | Admitting: Hematology and Oncology

## 2023-02-08 ENCOUNTER — Other Ambulatory Visit: Payer: Self-pay

## 2023-02-12 ENCOUNTER — Other Ambulatory Visit: Payer: Self-pay

## 2023-02-12 ENCOUNTER — Encounter: Payer: Self-pay | Admitting: Hematology and Oncology

## 2023-02-16 ENCOUNTER — Other Ambulatory Visit: Payer: Self-pay

## 2023-02-18 ENCOUNTER — Encounter: Payer: Self-pay | Admitting: Hematology and Oncology

## 2023-02-18 ENCOUNTER — Other Ambulatory Visit: Payer: Self-pay

## 2023-02-25 ENCOUNTER — Encounter: Payer: Self-pay | Admitting: Nurse Practitioner

## 2023-02-25 ENCOUNTER — Ambulatory Visit: Payer: Medicaid Other | Admitting: Nurse Practitioner

## 2023-02-25 VITALS — BP 120/74 | HR 76 | Temp 97.9°F | Ht 68.0 in | Wt 195.4 lb

## 2023-02-25 DIAGNOSIS — E1165 Type 2 diabetes mellitus with hyperglycemia: Secondary | ICD-10-CM | POA: Diagnosis not present

## 2023-02-25 DIAGNOSIS — Z862 Personal history of diseases of the blood and blood-forming organs and certain disorders involving the immune mechanism: Secondary | ICD-10-CM | POA: Diagnosis not present

## 2023-02-25 DIAGNOSIS — I10 Essential (primary) hypertension: Secondary | ICD-10-CM | POA: Diagnosis not present

## 2023-02-25 DIAGNOSIS — D509 Iron deficiency anemia, unspecified: Secondary | ICD-10-CM

## 2023-02-25 DIAGNOSIS — Z794 Long term (current) use of insulin: Secondary | ICD-10-CM | POA: Diagnosis not present

## 2023-02-25 DIAGNOSIS — Z1211 Encounter for screening for malignant neoplasm of colon: Secondary | ICD-10-CM

## 2023-02-25 DIAGNOSIS — E538 Deficiency of other specified B group vitamins: Secondary | ICD-10-CM

## 2023-02-25 DIAGNOSIS — Z8719 Personal history of other diseases of the digestive system: Secondary | ICD-10-CM | POA: Diagnosis not present

## 2023-02-25 DIAGNOSIS — E785 Hyperlipidemia, unspecified: Secondary | ICD-10-CM | POA: Diagnosis not present

## 2023-02-25 NOTE — Patient Instructions (Signed)
Check blood sugar in the morning before you eat and at night for 1 month and bring it to the next appointment. Please schedule for fasting labs.

## 2023-02-25 NOTE — Progress Notes (Signed)
New Patient Office Visit  Subjective    Patient ID: Luke [name], male    DOB: Dec 09, 1980  Age: 42 y.o. MRN: [id number]  CC:  Chief Complaint  Patient presents with   Establish Care    HPI Luke [name] presents to establish care. [facility]  He has history of diabetes, diabetic neuropathy, hypertension, anemia, h/o right colon mass s/p right colectomy ., diverticulitis, right inguinal hernia cocaine use.  Diabetes: Last hemoglobin A1c 15.2 on 12/31/22. On insulin glargine, lispro Hypertension: Amlodipine and pravastatin.   Health Maintenance  Topic Date Due   Hepatitis C Screening  Never done   DTaP/Tdap/Td (1 - Tdap) Never done   OPHTHALMOLOGY EXAM  01/31/2022   COLONOSCOPY (Pts 45-67yrs Insurance coverage will need to be confirmed)  02/17/2022   Diabetic kidney evaluation - Urine ACR  01/15/2023   FOOT EXAM  01/15/2023   COVID-19 Vaccine (1) 03/13/2023 (Originally 12/27/1984)   INFLUENZA VACCINE  06/03/2023   HEMOGLOBIN A1C  07/01/2023   Diabetic kidney evaluation - eGFR measurement  01/08/2024   HIV Screening  Completed   HPV VACCINES  Aged Out    There are no preventive care reminders to display for this patient.  Outpatient Encounter Medications as of 02/25/2023  Medication Sig   amLODipine (NORVASC) 5 MG tablet Take 1 tablet (5 mg total) by mouth daily.   blood glucose meter kit and supplies KIT Dispense based on patient and insurance preference. Use up to four times daily as directed. (FOR ICD-9 250.00, 250.01).   gabapentin (NEURONTIN) 300 MG capsule Take 1 capsule (300 mg total) by mouth at bedtime.   Insulin Glargine (BASAGLAR KWIKPEN) 100 UNIT/ML Inject 34 Units into the skin at bedtime.   insulin lispro (HUMALOG KWIKPEN) 100 UNIT/ML KwikPen Inject 10 Units into the skin 3 (three) times daily.   Insulin Pen Needle (NOVOFINE PEN NEEDLE) 32G X 6 MM MISC use as directed   Insulin Pen Needle 32G X 4 MM MISC USE AS DIRECTED  WITH INSULIN.   pravastatin (PRAVACHOL) 80 MG tablet Take 1 tablet (80 mg total) by mouth daily.   [DISCONTINUED] HYDROcodone-acetaminophen (NORCO/VICODIN) 5-325 MG tablet Take 1-2 tablets by mouth every 4 (four) hours as needed for moderate pain.   [DISCONTINUED] metFORMIN (GLUCOPHAGE) 850 MG tablet Take 1 tablet (850 mg total) by mouth 2 (two) times daily with a meal.   No facility-administered encounter medications on file as of 02/25/2023.    Past Medical History:  Diagnosis Date   Anemia    Cecum mass 08/15/2018   a.) 4.8 cm pedunculated cecal mass; Bx (+) for villous adeoma. b.) s/p RIGHT colectomy.   Chronic kidney disease    Colonic neoplasm    Diverticulosis of large intestine without diverticulitis    GERD (gastroesophageal reflux disease)    Hyperlipidemia    Hypertension    Pneumonia 09/07/2016   Sleep apnea    Stomach irritation    Type 2 diabetes mellitus treated with insulin [facility])     Past Surgical History:  Procedure Laterality Date   COLON SURGERY     COLONOSCOPY WITH PROPOFOL N/A 07/28/2018   Procedure: COLONOSCOPY WITH PROPOFOL;  Surgeon: Pasty Spillers, MD;  Location: [facility] ENDOSCOPY;  Service: Endoscopy;  Laterality: N/A;   COLONOSCOPY WITH PROPOFOL N/A 10/18/2019   Procedure: COLONOSCOPY WITH PROPOFOL;  Surgeon: Pasty Spillers, MD;  Location: [facility] ENDOSCOPY;  Service: Endoscopy;  Laterality: N/A;   COLONOSCOPY WITH  PROPOFOL N/A 02/17/2021   Procedure: COLONOSCOPY WITH PROPOFOL;  Surgeon: Pasty Spillers, MD;  Location: [facility] ENDOSCOPY;  Service: Endoscopy;  Laterality: N/A;   ESOPHAGOGASTRODUODENOSCOPY (EGD) WITH PROPOFOL N/A 07/28/2018   Procedure: ESOPHAGOGASTRODUODENOSCOPY (EGD) WITH PROPOFOL;  Surgeon: Pasty Spillers, MD;  Location: [facility] ENDOSCOPY;  Service: Endoscopy;  Laterality: N/A;   PARTIAL COLECTOMY Right 08/15/2018   Procedure: RIGHT  COLECTOMY;  Surgeon: Ancil Linsey, MD;  Location: [facility] ORS;  Service: General;  Laterality:  Right;   XI ROBOTIC ASSISTED INGUINAL HERNIA REPAIR WITH MESH Right 01/07/2023   Procedure: XI ROBOTIC ASSISTED INGUINAL HERNIA REPAIR WITH MESH, poss Bilateral;  Surgeon: Leafy Ro, MD;  Location: [facility] ORS;  Service: General;  Laterality: Right;  Possible Bilateral    Family History  Problem Relation Age of Onset   Heart disease Mother    Diabetes Mother    Cancer Mother    Breast cancer Mother    Hypertension Mother    Arthritis Father    Hypertension Father    Colon cancer Paternal Uncle    CAD Maternal Grandmother    Breast cancer Maternal Grandmother    Throat cancer Maternal Grandfather    Diabetes Paternal Grandmother     Social History   Socioeconomic History   Marital status: Single    Spouse name: Not on file   Number of children: Not on file   Years of education: 12   Highest education level: Not on file  Occupational History    Comment: [employer]  Tobacco Use   Smoking status: Former    Packs/day: .1    Types: Cigarettes    Quit date: 03/25/2020    Years since quitting: 2.9   Smokeless tobacco: Never   Tobacco comments:    patient is social smoker/ smokes when he drinks   Vaping Use   Vaping Use: Never used  Substance and Sexual Activity   Alcohol use: Yes    Alcohol/week: 2.0 standard drinks of alcohol    Types: 2 Shots of liquor per week   Drug use: Not Currently    Types: Marijuana    Comment: last use 2020   Sexual activity: Not Currently    Birth control/protection: Condom  Other Topics Concern   Not on file  Social History Narrative   Not on file   Social Determinants of Health   Financial Resource Strain: Not on file  Food Insecurity: No Food Insecurity (04/15/2022)   Hunger Vital Sign    Worried About Running Out of Food in the Last Year: Never true    Ran Out of Food in the Last Year: Never true  Transportation Needs: No Transportation Needs (04/15/2022)   PRAPARE - Administrator, Civil Service (Medical): No    Lack  of Transportation (Non-Medical): No  Physical Activity: Not on file  Stress: Not on file  Social Connections: Not on file  Intimate Partner Violence: Not on file    Review of Systems  Constitutional: Negative.   HENT: Negative.    Eyes: Negative.   Respiratory: Negative.    Cardiovascular:  Negative for chest pain and claudication.  Gastrointestinal: Negative.   Genitourinary: Negative.   Musculoskeletal: Negative.   Skin: Negative.   Neurological: Negative.   Endo/Heme/Allergies: Negative.   Psychiatric/Behavioral: Negative.          Objective    BP 120/74   Pulse 76   Temp 97.9 F (36.6 C)   Ht 5\' 8"  (1.727  m)   Wt 195 lb 6.4 oz (88.6 kg)   SpO2 98%   BMI 29.71 kg/m   Physical Exam Constitutional:      Appearance: Normal appearance.  HENT:     Head: Normocephalic.     Right Ear: Tympanic membrane normal.     Left Ear: Tympanic membrane normal.     Nose: Nose normal.     Mouth/Throat:     Mouth: Mucous membranes are moist.     Pharynx: Oropharynx is clear.  Eyes:     Extraocular Movements: Extraocular movements intact.     Conjunctiva/sclera: Conjunctivae normal.     Pupils: Pupils are equal, round, and reactive to light.  Cardiovascular:     Rate and Rhythm: Normal rate and regular rhythm.     Pulses: Normal pulses.     Heart sounds: Normal heart sounds.  Pulmonary:     Effort: Pulmonary effort is normal.     Breath sounds: Normal breath sounds. No stridor. No wheezing.  Abdominal:     General: Bowel sounds are normal.     Palpations: Abdomen is soft. There is no mass.     Tenderness: There is no abdominal tenderness.  Musculoskeletal:        General: Normal range of motion.     Cervical back: Normal range of motion.  Skin:    Coloration: Skin is not jaundiced.     Findings: No erythema.  Neurological:     General: No focal deficit present.     Mental Status: He is alert and oriented to person, place, and time. Mental status is at baseline.   Psychiatric:        Mood and Affect: Mood normal.        Behavior: Behavior normal.        Thought Content: Thought content normal.        Judgment: Judgment normal.         Assessment & Plan:  Primary hypertension Assessment & Plan: Patient BP  Vitals:   02/25/23 1402  BP: 120/74    in the office. Advised pt to follow a low sodium and heart healthy diet. Continue amlodipine 5 mg daily   Orders: -     Hepatic function panel  Type 2 diabetes mellitus with hyperglycemia, with long-term current use of insulin (HCC) Assessment & Plan: Advised pt to check the BS regularly, make a log and bring to next appointment.  Advised pt to monitor diet. Advised pt to eat variety of food including fruits, vegetables, whole grains, complex carbohydrates and proteins.  Continue glargine 34 units at bedtime and Humalog 10 units 3 times a day.    Orders: -     Microalbumin / creatinine urine ratio -     Lipid panel  B12 deficiency -     Vitamin B12  Dyslipidemia (high LDL; low HDL) Assessment & Plan: Lab Results  Component Value Date   CHOL 178 09/10/2021   HDL 24 (L) 09/10/2021   LDLCALC 71 09/10/2021   TRIG 532 (H) 09/10/2021   CHOLHDL 7.4 (H) 09/10/2021  Continue pravastatin daily. If triglycerides remain elevated would consider starting him on fenofibrate.    Encounter for screening colonoscopy -     Ambulatory referral to Gastroenterology  History of gastric polyp -     Ambulatory referral to Gastroenterology  History of anemia -     Iron, TIBC and Ferritin Panel    Return in about 5 weeks (around 04/01/2023).   Blythe Hartshorn  Evelene Croon, NP

## 2023-02-28 DIAGNOSIS — Z794 Long term (current) use of insulin: Secondary | ICD-10-CM | POA: Insufficient documentation

## 2023-02-28 NOTE — Assessment & Plan Note (Signed)
Advised pt to check the BS regularly, make a log and bring to next appointment.  Advised pt to monitor diet. Advised pt to eat variety of food including fruits, vegetables, whole grains, complex carbohydrates and proteins.  Continue glargine 34 units at bedtime and Humalog 10 units 3 times a day.

## 2023-02-28 NOTE — Assessment & Plan Note (Signed)
Lab Results  Component Value Date   CHOL 178 09/10/2021   HDL 24 (L) 09/10/2021   LDLCALC 71 09/10/2021   TRIG 532 (H) 09/10/2021   CHOLHDL 7.4 (H) 09/10/2021  Continue pravastatin daily. If triglycerides remain elevated would consider starting him on fenofibrate.

## 2023-02-28 NOTE — Assessment & Plan Note (Signed)
Patient BP  Vitals:   02/25/23 1402  BP: 120/74    in the office. Advised pt to follow a low sodium and heart healthy diet. Continue amlodipine 5 mg daily

## 2023-02-28 NOTE — Assessment & Plan Note (Signed)
Continue glargine 34 units and Humalog 10 units 3 times a day.

## 2023-03-04 ENCOUNTER — Ambulatory Visit: Payer: Medicaid Other | Attending: Cardiology | Admitting: Cardiology

## 2023-03-05 ENCOUNTER — Encounter: Payer: Self-pay | Admitting: Cardiology

## 2023-03-08 ENCOUNTER — Ambulatory Visit: Payer: Medicaid Other | Admitting: Physician Assistant

## 2023-03-12 ENCOUNTER — Other Ambulatory Visit: Payer: Self-pay

## 2023-03-15 ENCOUNTER — Other Ambulatory Visit: Payer: Self-pay

## 2023-03-22 ENCOUNTER — Other Ambulatory Visit: Payer: Self-pay

## 2023-03-23 ENCOUNTER — Other Ambulatory Visit: Payer: Self-pay

## 2023-03-23 NOTE — Addendum Note (Signed)
Addended by: Warden Fillers on: 03/23/2023 04:09 PM   Modules accepted: Orders

## 2023-03-26 ENCOUNTER — Other Ambulatory Visit (INDEPENDENT_AMBULATORY_CARE_PROVIDER_SITE_OTHER): Payer: 59

## 2023-03-26 DIAGNOSIS — I10 Essential (primary) hypertension: Secondary | ICD-10-CM

## 2023-03-26 DIAGNOSIS — E1165 Type 2 diabetes mellitus with hyperglycemia: Secondary | ICD-10-CM | POA: Diagnosis not present

## 2023-03-26 DIAGNOSIS — Z862 Personal history of diseases of the blood and blood-forming organs and certain disorders involving the immune mechanism: Secondary | ICD-10-CM | POA: Diagnosis not present

## 2023-03-26 DIAGNOSIS — Z794 Long term (current) use of insulin: Secondary | ICD-10-CM

## 2023-03-26 DIAGNOSIS — E538 Deficiency of other specified B group vitamins: Secondary | ICD-10-CM

## 2023-03-26 LAB — LIPID PANEL
Cholesterol: 164 mg/dL (ref 0–200)
HDL: 28.9 mg/dL — ABNORMAL LOW (ref >39.00)
NonHDL: 135.03
Total CHOL/HDL Ratio: 6
Triglycerides: 243 mg/dL — ABNORMAL HIGH (ref 0.0–149.0)
VLDL: 48.6 mg/dL — ABNORMAL HIGH (ref 0.0–40.0)

## 2023-03-26 LAB — IBC + FERRITIN
Ferritin: 61.1 ng/mL (ref 22.0–322.0)
Iron: 59 ug/dL (ref 42–165)
Saturation Ratios: 18.1 % — ABNORMAL LOW (ref 20.0–50.0)
TIBC: 326.2 ug/dL (ref 250.0–450.0)
Transferrin: 233 mg/dL (ref 212.0–360.0)

## 2023-03-26 LAB — MICROALBUMIN / CREATININE URINE RATIO
Creatinine,U: 167.3 mg/dL
Microalb Creat Ratio: 0.9 mg/g (ref 0.0–30.0)
Microalb, Ur: 1.6 mg/dL (ref 0.0–1.9)

## 2023-03-26 LAB — HEPATIC FUNCTION PANEL
ALT: 15 U/L (ref 0–53)
AST: 12 U/L (ref 0–37)
Albumin: 4.1 g/dL (ref 3.5–5.2)
Alkaline Phosphatase: 63 U/L (ref 39–117)
Bilirubin, Direct: 0.1 mg/dL (ref 0.0–0.3)
Total Bilirubin: 0.3 mg/dL (ref 0.2–1.2)
Total Protein: 6.9 g/dL (ref 6.0–8.3)

## 2023-03-26 LAB — LDL CHOLESTEROL, DIRECT: Direct LDL: 103 mg/dL

## 2023-03-26 LAB — VITAMIN B12: Vitamin B-12: 365 pg/mL (ref 211–911)

## 2023-04-01 ENCOUNTER — Ambulatory Visit: Payer: Medicaid Other | Admitting: Nurse Practitioner

## 2023-04-01 ENCOUNTER — Emergency Department
Admission: EM | Admit: 2023-04-01 | Discharge: 2023-04-01 | Discharge status: Against Medical Advice | Payer: Medicaid Other

## 2023-04-01 NOTE — Progress Notes (Signed)
Please inform Luke [name],  Triglycerides elevated (243) improved from last time (532).  HDL 28.90 improved from last time (24).  Iron saturation is on the lower side 18.1(20 to 50%).  Advised to take iron supplements and iron rich diet. Vitamin B12, liver functions and urine microalbumin looks good. Need follow up in 3 months.

## 2023-04-12 ENCOUNTER — Other Ambulatory Visit: Payer: Self-pay | Admitting: Nurse Practitioner

## 2023-04-12 ENCOUNTER — Other Ambulatory Visit: Payer: Self-pay

## 2023-04-12 DIAGNOSIS — E1140 Type 2 diabetes mellitus with diabetic neuropathy, unspecified: Secondary | ICD-10-CM

## 2023-04-13 ENCOUNTER — Other Ambulatory Visit: Payer: Self-pay

## 2023-04-15 ENCOUNTER — Other Ambulatory Visit: Payer: Self-pay | Admitting: Nurse Practitioner

## 2023-04-15 ENCOUNTER — Other Ambulatory Visit: Payer: Self-pay

## 2023-04-15 DIAGNOSIS — I10 Essential (primary) hypertension: Secondary | ICD-10-CM

## 2023-04-15 MED FILL — Gabapentin Cap 300 MG: ORAL | 30 days supply | Qty: 30 | Fill #0 | Status: AC

## 2023-04-16 ENCOUNTER — Other Ambulatory Visit: Payer: Self-pay

## 2023-04-16 MED FILL — Amlodipine Besylate Tab 5 MG (Base Equivalent): ORAL | 30 days supply | Qty: 30 | Fill #0 | Status: AC

## 2023-05-10 ENCOUNTER — Encounter: Payer: Self-pay | Admitting: Hematology and Oncology

## 2023-05-14 ENCOUNTER — Other Ambulatory Visit: Payer: Self-pay | Admitting: Nurse Practitioner

## 2023-05-14 ENCOUNTER — Other Ambulatory Visit: Payer: Self-pay

## 2023-05-14 DIAGNOSIS — E1140 Type 2 diabetes mellitus with diabetic neuropathy, unspecified: Secondary | ICD-10-CM

## 2023-05-14 MED FILL — Insulin Glargine Soln Pen-Injector 100 Unit/ML: SUBCUTANEOUS | 26 days supply | Qty: 9 | Fill #0 | Status: CN

## 2023-05-17 ENCOUNTER — Other Ambulatory Visit: Payer: Self-pay | Admitting: Nurse Practitioner

## 2023-05-17 ENCOUNTER — Other Ambulatory Visit: Payer: Self-pay

## 2023-05-17 DIAGNOSIS — E1140 Type 2 diabetes mellitus with diabetic neuropathy, unspecified: Secondary | ICD-10-CM

## 2023-05-17 MED FILL — Amlodipine Besylate Tab 5 MG (Base Equivalent): ORAL | 30 days supply | Qty: 30 | Fill #1 | Status: AC

## 2023-05-18 ENCOUNTER — Other Ambulatory Visit: Payer: Self-pay | Admitting: Nurse Practitioner

## 2023-05-18 ENCOUNTER — Other Ambulatory Visit: Payer: Self-pay

## 2023-05-18 DIAGNOSIS — E1140 Type 2 diabetes mellitus with diabetic neuropathy, unspecified: Secondary | ICD-10-CM

## 2023-05-18 DIAGNOSIS — E1169 Type 2 diabetes mellitus with other specified complication: Secondary | ICD-10-CM

## 2023-05-18 MED FILL — Insulin Lispro Soln Pen-injector 100 Unit/ML (1 Unit Dial): SUBCUTANEOUS | 30 days supply | Qty: 9 | Fill #0 | Status: CN

## 2023-05-18 MED FILL — Pravastatin Sodium Tab 80 MG: ORAL | 30 days supply | Qty: 30 | Fill #0 | Status: CN

## 2023-05-20 ENCOUNTER — Other Ambulatory Visit: Payer: Self-pay

## 2023-05-20 ENCOUNTER — Encounter: Payer: Self-pay | Admitting: Nurse Practitioner

## 2023-05-20 ENCOUNTER — Ambulatory Visit (INDEPENDENT_AMBULATORY_CARE_PROVIDER_SITE_OTHER): Payer: 59 | Admitting: Nurse Practitioner

## 2023-05-20 VITALS — BP 130/78 | HR 70 | Temp 97.8°F | Ht 67.0 in | Wt 205.0 lb

## 2023-05-20 DIAGNOSIS — G4730 Sleep apnea, unspecified: Secondary | ICD-10-CM | POA: Diagnosis not present

## 2023-05-20 DIAGNOSIS — Z794 Long term (current) use of insulin: Secondary | ICD-10-CM | POA: Diagnosis not present

## 2023-05-20 DIAGNOSIS — E1140 Type 2 diabetes mellitus with diabetic neuropathy, unspecified: Secondary | ICD-10-CM | POA: Diagnosis not present

## 2023-05-20 DIAGNOSIS — G629 Polyneuropathy, unspecified: Secondary | ICD-10-CM | POA: Diagnosis not present

## 2023-05-20 DIAGNOSIS — E785 Hyperlipidemia, unspecified: Secondary | ICD-10-CM | POA: Diagnosis not present

## 2023-05-20 DIAGNOSIS — E1169 Type 2 diabetes mellitus with other specified complication: Secondary | ICD-10-CM | POA: Diagnosis not present

## 2023-05-20 LAB — POCT GLYCOSYLATED HEMOGLOBIN (HGB A1C): Hemoglobin A1C: 8.5 % — AB (ref 4.0–5.6)

## 2023-05-20 MED ORDER — BASAGLAR KWIKPEN 100 UNIT/ML ~~LOC~~ SOPN
34.0000 [IU] | PEN_INJECTOR | Freq: Every day | SUBCUTANEOUS | 2 refills | Status: DC
  Filled 2023-05-20: qty 15, 44d supply, fill #0

## 2023-05-20 MED ORDER — PRAVASTATIN SODIUM 80 MG PO TABS
80.0000 mg | ORAL_TABLET | Freq: Every day | ORAL | 2 refills | Status: DC
  Filled 2023-05-20: qty 30, 30d supply, fill #0
  Filled 2023-06-16: qty 30, 30d supply, fill #1
  Filled 2023-08-02: qty 30, 30d supply, fill #2

## 2023-05-20 MED ORDER — GABAPENTIN 300 MG PO CAPS
300.0000 mg | ORAL_CAPSULE | Freq: Every day | ORAL | 2 refills | Status: DC
  Filled 2023-05-20: qty 30, 30d supply, fill #0
  Filled 2023-06-18 – 2023-07-12 (×2): qty 30, 30d supply, fill #1
  Filled 2023-08-02: qty 30, 30d supply, fill #2

## 2023-05-20 MED ORDER — INSULIN LISPRO (1 UNIT DIAL) 100 UNIT/ML (KWIKPEN)
10.0000 [IU] | PEN_INJECTOR | Freq: Three times a day (TID) | SUBCUTANEOUS | 2 refills | Status: DC
  Filled 2023-05-20: qty 9, 30d supply, fill #0
  Filled 2023-06-16: qty 9, 30d supply, fill #1
  Filled 2023-07-12: qty 9, 30d supply, fill #2

## 2023-05-20 NOTE — Progress Notes (Signed)
Established Patient Office Visit  Subjective:  Patient ID: Luke [name], male    DOB: 03-26-1981  Age: 42 y.o. MRN: [id number]  CC:  Chief Complaint  Patient presents with   Medical Management of Chronic Issues    HPI  Luke [name] presents for chronic disease management. He checks the fasting BS from 150-160 and does not check post prandial. He is due for Hg A1c.    He states that he has apneic episodes at night. He feels tired even after sleeping at night.  He snores at night.   He also compliant off and on SOB with exertion.  Denies any chest pain. HPI   Past Medical History:  Diagnosis Date   Anemia    Cecum mass 08/15/2018   a.) 4.8 cm pedunculated cecal mass; Bx (+) for villous adeoma. b.) s/p RIGHT colectomy.   Chronic kidney disease    Colonic neoplasm    Diverticulosis of large intestine without diverticulitis    GERD (gastroesophageal reflux disease)    Hyperlipidemia    Hypertension    Pneumonia 09/07/2016   Sleep apnea    Stomach irritation    Type 2 diabetes mellitus treated with insulin [facility])     Past Surgical History:  Procedure Laterality Date   COLON SURGERY     COLONOSCOPY WITH PROPOFOL N/A 07/28/2018   Procedure: COLONOSCOPY WITH PROPOFOL;  Surgeon: Pasty Spillers, MD;  Location: [facility] ENDOSCOPY;  Service: Endoscopy;  Laterality: N/A;   COLONOSCOPY WITH PROPOFOL N/A 10/18/2019   Procedure: COLONOSCOPY WITH PROPOFOL;  Surgeon: Pasty Spillers, MD;  Location: [facility] ENDOSCOPY;  Service: Endoscopy;  Laterality: N/A;   COLONOSCOPY WITH PROPOFOL N/A 02/17/2021   Procedure: COLONOSCOPY WITH PROPOFOL;  Surgeon: Pasty Spillers, MD;  Location: [facility] ENDOSCOPY;  Service: Endoscopy;  Laterality: N/A;   ESOPHAGOGASTRODUODENOSCOPY (EGD) WITH PROPOFOL N/A 07/28/2018   Procedure: ESOPHAGOGASTRODUODENOSCOPY (EGD) WITH PROPOFOL;  Surgeon: Pasty Spillers, MD;  Location: [facility] ENDOSCOPY;  Service: Endoscopy;  Laterality: N/A;   PARTIAL COLECTOMY  Right 08/15/2018   Procedure: RIGHT  COLECTOMY;  Surgeon: Ancil Linsey, MD;  Location: [facility] ORS;  Service: General;  Laterality: Right;   XI ROBOTIC ASSISTED INGUINAL HERNIA REPAIR WITH MESH Right 01/07/2023   Procedure: XI ROBOTIC ASSISTED INGUINAL HERNIA REPAIR WITH MESH, poss Bilateral;  Surgeon: Leafy Ro, MD;  Location: [facility] ORS;  Service: General;  Laterality: Right;  Possible Bilateral    Family History  Problem Relation Age of Onset   Heart disease Mother    Diabetes Mother    Cancer Mother    Breast cancer Mother    Hypertension Mother    Arthritis Father    Hypertension Father    Colon cancer Paternal Uncle    CAD Maternal Grandmother    Breast cancer Maternal Grandmother    Throat cancer Maternal Grandfather    Diabetes Paternal Grandmother     Social History   Socioeconomic History   Marital status: Single    Spouse name: Not on file   Number of children: Not on file   Years of education: 12   Highest education level: Not on file  Occupational History    Comment: [employer]  Tobacco Use   Smoking status: Former    Current packs/day: 0.00    Types: Cigarettes    Quit date: 03/25/2020    Years since quitting: 3.1   Smokeless tobacco: Never   Tobacco comments:    patient is social smoker/ smokes when  he drinks   Vaping Use   Vaping status: Never Used  Substance and Sexual Activity   Alcohol use: Yes    Alcohol/week: 2.0 standard drinks of alcohol    Types: 2 Shots of liquor per week   Drug use: Not Currently    Types: Marijuana    Comment: last use 2020   Sexual activity: Not Currently    Birth control/protection: Condom  Other Topics Concern   Not on file  Social History Narrative   Not on file   Social Determinants of Health   Financial Resource Strain: Not on file  Food Insecurity: No Food Insecurity (04/15/2022)   Hunger Vital Sign    Worried About Running Out of Food in the Last Year: Never true    Ran Out of Food in the Last Year:  Never true  Transportation Needs: No Transportation Needs (04/15/2022)   PRAPARE - Administrator, Civil Service (Medical): No    Lack of Transportation (Non-Medical): No  Physical Activity: Not on file  Stress: Not on file  Social Connections: Not on file  Intimate Partner Violence: Not on file     Outpatient Medications Prior to Visit  Medication Sig Dispense Refill   amLODipine (NORVASC) 5 MG tablet Take 1 tablet (5 mg total) by mouth daily. 30 tablet 2   blood glucose meter kit and supplies KIT Dispense based on patient and insurance preference. Use up to four times daily as directed. (FOR ICD-9 250.00, 250.01). 1 each 0   Insulin Pen Needle (NOVOFINE PEN NEEDLE) 32G X 6 MM MISC use as directed 100 each 98   Insulin Pen Needle 32G X 4 MM MISC USE AS DIRECTED WITH INSULIN. 100 each PRN   gabapentin (NEURONTIN) 300 MG capsule Take 1 capsule (300 mg total) by mouth at bedtime. 30 capsule 2   Insulin Glargine (BASAGLAR KWIKPEN) 100 UNIT/ML Inject 34 Units into the skin at bedtime. 15 mL 2   insulin lispro (HUMALOG KWIKPEN) 100 UNIT/ML KwikPen Inject 10 Units into the skin 3 (three) times daily. 9 mL 2   pravastatin (PRAVACHOL) 80 MG tablet Take 1 tablet (80 mg total) by mouth daily. 30 tablet 2   No facility-administered medications prior to visit.    No Known Allergies  ROS Review of Systems Negative unless indicated in HPI.    Objective:    Physical Exam Constitutional:      Appearance: Normal appearance. He is normal weight.  HENT:     Head: Normocephalic.     Right Ear: Tympanic membrane normal.     Left Ear: Tympanic membrane normal.     Mouth/Throat:     Mouth: Mucous membranes are moist.  Eyes:     Extraocular Movements: Extraocular movements intact.     Conjunctiva/sclera: Conjunctivae normal.     Pupils: Pupils are equal, round, and reactive to light.  Neck:     Thyroid: No thyroid mass or thyroid tenderness.  Cardiovascular:     Rate and Rhythm:  Normal rate and regular rhythm.     Pulses: Normal pulses.     Heart sounds: Normal heart sounds. No murmur heard. Pulmonary:     Effort: Pulmonary effort is normal.     Breath sounds: Normal breath sounds.  Abdominal:     General: Bowel sounds are normal.     Palpations: Abdomen is soft. There is no mass.     Tenderness: There is no abdominal tenderness. There is no rebound.  Musculoskeletal:  General: No swelling.     Cervical back: Neck supple. No tenderness.     Right lower leg: No edema.     Left lower leg: No edema.  Skin:    Findings: No bruising, erythema or rash.  Neurological:     General: No focal deficit present.     Mental Status: He is alert and oriented to person, place, and time. Mental status is at baseline.  Psychiatric:        Mood and Affect: Mood normal.        Behavior: Behavior normal.        Thought Content: Thought content normal.        Judgment: Judgment normal.     BP 130/78   Pulse 70   Temp 97.8 F (36.6 C) (Oral)   Ht 5\' 7"  (1.702 m)   Wt 205 lb (93 kg)   SpO2 98%   BMI 32.11 kg/m  Wt Readings from Last 3 Encounters:  05/20/23 205 lb (93 kg)  02/25/23 195 lb 6.4 oz (88.6 kg)  01/08/23 190 lb (86.2 kg)     Health Maintenance  Topic Date Due   COVID-19 Vaccine (1) Never done   Hepatitis C Screening  Never done   DTaP/Tdap/Td (1 - Tdap) Never done   OPHTHALMOLOGY EXAM  01/31/2022   Colonoscopy  02/17/2022   FOOT EXAM  01/15/2023   INFLUENZA VACCINE  06/03/2023   HEMOGLOBIN A1C  11/20/2023   Diabetic kidney evaluation - eGFR measurement  01/08/2024   Diabetic kidney evaluation - Urine ACR  03/25/2024   HIV Screening  Completed   HPV VACCINES  Aged Out    There are no preventive care reminders to display for this patient.  Lab Results  Component Value Date   TSH 0.891 02/28/2020   Lab Results  Component Value Date   WBC 10.9 (H) 01/08/2023   HGB 16.4 01/08/2023   HCT 51.0 01/08/2023   MCV 87.0 01/08/2023   PLT 183  01/08/2023   Lab Results  Component Value Date   NA 132 (L) 01/08/2023   K 4.1 01/08/2023   CO2 21 (L) 01/08/2023   GLUCOSE 337 (H) 01/08/2023   BUN 15 01/08/2023   CREATININE 1.30 (H) 01/08/2023   BILITOT 0.3 03/26/2023   ALKPHOS 63 03/26/2023   AST 12 03/26/2023   ALT 15 03/26/2023   PROT 6.9 03/26/2023   ALBUMIN 4.1 03/26/2023   CALCIUM 9.5 01/08/2023   ANIONGAP 12 01/08/2023   EGFR 88 03/19/2021   Lab Results  Component Value Date   CHOL 164 03/26/2023   Lab Results  Component Value Date   HDL 28.90 (L) 03/26/2023   Lab Results  Component Value Date   LDLCALC 71 09/10/2021   Lab Results  Component Value Date   TRIG 243.0 (H) 03/26/2023   Lab Results  Component Value Date   CHOLHDL 6 03/26/2023   Lab Results  Component Value Date   HGBA1C 8.5 (A) 05/20/2023      Assessment & Plan:  Peripheral polyneuropathy Assessment & Plan: Stable on gabapentin   Type 2 diabetes mellitus with diabetic neuropathy, without long-term current use of insulin (HCC) Assessment & Plan: POCT Hemoglobin A1c decreased to 8.5 compared to 15.2 on 12/31/2022. Continue the current medication regimen.   Orders: -     Gabapentin; Take 1 capsule (300 mg total) by mouth at bedtime.  Dispense: 30 capsule; Refill: 2 -     Basaglar KwikPen; Inject 34 Units into  the skin at bedtime.  Dispense: 15 mL; Refill: 2 -     Insulin Lispro (1 Unit Dial); Inject 10 Units into the skin 3 (three) times daily.  Dispense: 9 mL; Refill: 2 -     POCT glycosylated hemoglobin (Hb A1C)  Hyperlipidemia associated with type 2 diabetes mellitus [facility]) Assessment & Plan: Lab Results  Component Value Date   CHOL 164 03/26/2023   HDL 28.90 (L) 03/26/2023   LDLCALC 71 09/10/2021   LDLDIRECT 103.0 03/26/2023   TRIG 243.0 (H) 03/26/2023   CHOLHDL 6 03/26/2023   Continue statin therapy for cardiovascular risk reduction  Orders: -     Pravastatin Sodium; Take 1 tablet (80 mg total) by mouth daily.   Dispense: 30 tablet; Refill: 2  Sleep apnea, unspecified type Assessment & Plan: He reports having apneic episodes and feeling fatigue during the daytime. Referral sent for sleep study     Follow-up: Return in about 4 months (around 09/20/2023) for chronic management, follow up with fasting lab 2 days prior.   Kara Dies, NP

## 2023-05-21 ENCOUNTER — Other Ambulatory Visit: Payer: Self-pay

## 2023-05-31 DIAGNOSIS — E781 Pure hyperglyceridemia: Secondary | ICD-10-CM | POA: Insufficient documentation

## 2023-05-31 DIAGNOSIS — G629 Polyneuropathy, unspecified: Secondary | ICD-10-CM | POA: Insufficient documentation

## 2023-05-31 DIAGNOSIS — E1169 Type 2 diabetes mellitus with other specified complication: Secondary | ICD-10-CM | POA: Insufficient documentation

## 2023-05-31 DIAGNOSIS — G4730 Sleep apnea, unspecified: Secondary | ICD-10-CM | POA: Insufficient documentation

## 2023-05-31 DIAGNOSIS — Z9189 Other specified personal risk factors, not elsewhere classified: Secondary | ICD-10-CM | POA: Insufficient documentation

## 2023-05-31 NOTE — Assessment & Plan Note (Signed)
Lab Results  Component Value Date   CHOL 164 03/26/2023   HDL 28.90 (L) 03/26/2023   LDLCALC 71 09/10/2021   LDLDIRECT 103.0 03/26/2023   TRIG 243.0 (H) 03/26/2023   CHOLHDL 6 03/26/2023   Continue statin therapy for cardiovascular risk reduction

## 2023-05-31 NOTE — Assessment & Plan Note (Signed)
Stable on gabapentin.   

## 2023-05-31 NOTE — Assessment & Plan Note (Signed)
He reports having apneic episodes and feeling fatigue during the daytime. Referral sent for sleep study

## 2023-05-31 NOTE — Assessment & Plan Note (Signed)
POCT Hemoglobin A1c decreased to 8.5 compared to 15.2 on 12/31/2022. Continue the current medication regimen.

## 2023-06-16 ENCOUNTER — Other Ambulatory Visit: Payer: Self-pay

## 2023-06-16 MED FILL — Amlodipine Besylate Tab 5 MG (Base Equivalent): ORAL | 30 days supply | Qty: 30 | Fill #2 | Status: AC

## 2023-06-30 ENCOUNTER — Other Ambulatory Visit: Payer: Self-pay

## 2023-07-12 ENCOUNTER — Other Ambulatory Visit: Payer: Self-pay

## 2023-07-21 ENCOUNTER — Ambulatory Visit (INDEPENDENT_AMBULATORY_CARE_PROVIDER_SITE_OTHER): Payer: 59 | Admitting: Surgery

## 2023-07-21 ENCOUNTER — Encounter: Payer: Self-pay | Admitting: Surgery

## 2023-07-21 VITALS — BP 137/84 | HR 80 | Temp 98.3°F | Ht 68.0 in | Wt 207.0 lb

## 2023-07-21 DIAGNOSIS — K436 Other and unspecified ventral hernia with obstruction, without gangrene: Secondary | ICD-10-CM | POA: Diagnosis not present

## 2023-07-21 NOTE — Patient Instructions (Signed)
You have requested to have a Ventral Hernia Repair. This will be done by Dr Everlene Farrier at [facility]. Please see your (BLUE) Pre-care sheet for more information. Our surgery scheduler will call you to look at surgery dates and to go over surgery information.   You will need to arrange to be out of work for approximately 1-2 weeks and then you may return with a lifting restriction for 4 more weeks. If you have FMLA or Disability paperwork that needs to be filled out, please have your company fax your paperwork to [phone] or you may drop this by either office. This paperwork will be filled out within 3 days after your surgery has been completed.     Ventral Hernia A ventral hernia (also called an incisional hernia) is a hernia that occurs at the site of a previous surgical cut (incision) in the abdomen. The abdominal wall spans from your lower chest down to your pelvis. If the abdominal wall is weakened from a surgical incision, a hernia can occur. A hernia is a bulge of bowel or muscle tissue pushing out on the weakened part of the abdominal wall. Ventral hernias can get bigger from straining or lifting. Obese and older people are at higher risk for a ventral hernia. People who develop infections after surgery or require repeat incisions at the same site on the abdomen are also at increased risk. CAUSES  A ventral hernia occurs because of weakness in the abdominal wall at an incision site.  SYMPTOMS  Common symptoms include: A visible bulge or lump on the abdominal wall. Pain or tenderness around the lump. Increased discomfort if you cough or make a sudden movement. If the hernia has blocked part of the intestine, a serious complication can occur (incarcerated or strangulated hernia). This can become a problem that requires emergency surgery because the blood flow to the blocked intestine may be cut off. Symptoms may include: Feeling sick to your stomach (nauseous). Throwing up (vomiting). Stomach  swelling (distention) or bloating. Fever. Rapid heartbeat. DIAGNOSIS  Your health care provider will take a medical history and perform a physical exam. Various tests may be ordered, such as: Blood tests. Urine tests. Ultrasonography. X-rays. Computed tomography (CT). TREATMENT  Watchful waiting may be all that is needed for a smaller hernia that does not cause symptoms. Your health care provider may recommend the use of a supportive belt (truss) that helps to keep the abdominal wall intact. For larger hernias or those that cause pain, surgery to repair the hernia is usually recommended. If a hernia becomes strangulated, emergency surgery needs to be done right away. HOME CARE INSTRUCTIONS Avoid putting pressure or strain on the abdominal area. Avoid heavy lifting. Use good body positioning for physical tasks. Ask your health care provider about proper body positioning. Use a supportive belt as directed by your health care provider. Maintain a healthy weight. Eat foods that are high in fiber, such as whole grains, fruits, and vegetables. Fiber helps prevent difficult bowel movements (constipation). Drink enough fluids to keep your urine clear or pale yellow. Follow up with your health care provider as directed. SEEK MEDICAL CARE IF:  Your hernia seems to be getting larger or more painful. SEEK IMMEDIATE MEDICAL CARE IF:  You have abdominal pain that is sudden and sharp. Your pain becomes severe. You have repeated vomiting. You are sweating a lot. You notice a rapid heartbeat. You develop a fever. MAKE SURE YOU:  Understand these instructions. Will watch your condition. Will get help  right away if you are not doing well or get worse.     Open Ventral Hernia Repair Open ventral hernia repair is a surgery to fix a ventral hernia. A ventral hernia,  is a bulge of body tissue or intestines that pushes through the front part of the abdomen. This can happen if the connective tissue  covering the muscles over the abdomen has a weak spot or is torn because of a surgical cut (incision) from a previous surgery. A ventral hernia repair is often done soon after diagnosis to stop the hernia from getting bigger, becoming uncomfortable, or becoming an emergency. This surgery usually takes about 2 hours, but the time can vary greatly.  LET [facility] CARE PROVIDER KNOW ABOUT: Any allergies you have. All medicines you are taking, including steroids, vitamins, herbs, eye drops, creams, and over-the-counter medicines. Previous problems you or members of your family have had with the use of anesthetics. Any blood disorders you have. Previous surgeries you have had. Medical conditions you have.  RISKS AND COMPLICATIONS  Generally, Open ventral hernia repair is a safe procedure. However, as with any surgical procedure, problems can occur. Possible problems include: Bleeding. Trouble passing urine or having a bowel movement after the surgery. Infection. Pneumonia. Blood clots. Pain in the area of the hernia. A bulge in the area of the hernia that may be caused by a collection of fluid. Injury to intestines or other structures in the abdomen. Return of the hernia after surgery.  BEFORE THE PROCEDURE  You may need to have blood tests, urine tests, a chest X-ray, or an electrocardiogram done before the day of the surgery. Ask your health care provider about changing or stopping your regular medicines. This is especially important if you are taking diabetes medicines or blood thinners. You may need to wash with a special type of germ-killing soap. Do not eat or drink anything after midnight the night before the procedure or as directed by your health care provider. Make plans to have someone drive you home after the procedure.  PROCEDURE  Small monitors will be put on your body. They are used to check your heart, blood pressure, and oxygen level. An IV access tube will be put into a  vein in your hand or arm. Fluids and medicine will flow directly into your body through the IV tube. You will be given medicine that makes you go to sleep (general anesthetic). Your abdomen will be cleaned with a special soap to kill any germs on your skin. Once you are asleep, a moderate - large size incision will be made in your abdomen. The size of incision depends on how large your hernia is. Your surgeon puts the tissue or intestines that formed the hernia back in place. A screen-like patch (mesh) is used to close the hernia. This helps make the area stronger. Stitches, tacks, or staples are used to keep the mesh in place. Medicine and a bandage (dressing) or skin glue will be put over the incision.  AFTER THE PROCEDURE  You will stay in a recovery area until the anesthetic wears off. Your blood pressure and pulse will be checked often. You may be able to go home the same day or may need to stay in the hospital for 1-2 days after surgery. Your surgeon will decide when you can go home depending upon your recovery. You may feel some pain. You will be given medicine for pain. You will be urged to do breathing exercises that involve  taking deep breaths. This helps prevent a lung infection after a surgery. You may have to wear compression stockings while you are in the hospital. These stockings help keep blood clots from forming in your legs.   This information is not intended to replace advice given to you by your health care provider. Make sure you discuss any questions you have with your health care provider.   Document Released: 10/05/2012 Document Revised: 10/24/2013 Document Reviewed: 10/05/2012 Elsevier Interactive Patient Education [employer]

## 2023-07-22 ENCOUNTER — Telehealth: Payer: Self-pay | Admitting: Surgery

## 2023-07-22 NOTE — Telephone Encounter (Signed)
Patient calls back, he is now informed of all dates regarding surgery.

## 2023-07-22 NOTE — Telephone Encounter (Signed)
Voice mailbox not set up, was not able to leave a message.  If patient calls back, please inform him of the following regarding scheduled surgery with Dr. Everlene Farrier.   Pre-Admission date/time, and Surgery date at [facility].  Surgery Date: 08/17/23 Preadmission Testing Date: 08/06/23 (phone 1p-4p)  Also patient needs to call at [phone], between 1-3:00pm the day before surgery, to find out what time to arrive for surgery.   '

## 2023-07-23 NOTE — H&P (View-Only) (Signed)
Outpatient Surgical Follow Up  07/23/2023  Luke [name] is an 42 y.o. male.   Chief Complaint  Patient presents with   Follow-up    HPI: Doing is a 42 year old male well-known to me 6 months out from a robotic inguinal hernia repair.  Now he feels a bulge.  He also reports some intermittent pain around the defect.  Pain is mild to moderate, sharp and worsening with Valsalva.  No evidence of strangulation.  No nausea no vomiting his taking p.o.  Past Medical History:  Diagnosis Date   Anemia    Cecum mass 08/15/2018   a.) 4.8 cm pedunculated cecal mass; Bx (+) for villous adeoma. b.) s/p RIGHT colectomy.   Chronic kidney disease    Colonic neoplasm    Diverticulosis of large intestine without diverticulitis    GERD (gastroesophageal reflux disease)    Hyperlipidemia    Hypertension    Pneumonia 09/07/2016   Sleep apnea    Stomach irritation    Type 2 diabetes mellitus treated with insulin [facility])     Past Surgical History:  Procedure Laterality Date   COLON SURGERY     COLONOSCOPY WITH PROPOFOL N/A 07/28/2018   Procedure: COLONOSCOPY WITH PROPOFOL;  Surgeon: Pasty Spillers, MD;  Location: [facility] END[facility]COPY;  Service: Endoscopy;  Laterality: N/A;   COLONOSCOPY WITH PROPOFOL N/A 10/18/2019   Procedure: COLONOSCOPY WITH PROPOFOL;  Surgeon: Pasty Spillers, MD;  Location: [facility];  Service: Endoscopy;  Laterality: N/A;   COLONOSCOPY WITH PROPOFOL N/A 02/17/2021   Procedure: COLONOSCOPY WITH PROPOFOL;  Surgeon: Pasty Spillers, MD;  Location: [facility]OSCOPY;  Service: Endoscopy;  Laterality: N/A;   ESOPHAGOGASTRODUODENOSCOPY (EGD) WITH PROPOFOL N/A 07/28/2018   Procedure: ESOPHAGOGASTRODUODENOSCOPY (EGD) WITH PROPOFOL;  Surgeon: Pasty Spillers, MD;  Location: [facility] END[facility];  Service: Endoscopy;  Laterality: N/A;   PARTIAL COLECTOMY Right 08/15/2018   Procedure: RIGHT  COLECTOMY;  Surgeon: Ancil Linsey, MD;  Location: [facility] ORS;  Service: General;   Laterality: Right;   XI ROBOTIC ASSISTED INGUINAL HERNIA REPAIR WITH MESH Right 01/07/2023   Procedure: XI ROBOTIC ASSISTED INGUINAL HERNIA REPAIR WITH MESH, poss Bilateral;  Surgeon: Leafy Ro, MD;  Location: [facility] ORS;  Service: General;  Laterality: Right;  Possible Bilateral    Family History  Problem Relation Age of Onset   Heart disease Mother    Diabetes Mother    Cancer Mother    Breast cancer Mother    Hypertension Mother    Arthritis Father    Hypertension Father    Colon cancer Paternal Uncle    CAD Maternal Grandmother    Breast cancer Maternal Grandmother    Throat cancer Maternal Grandfather    Diabetes Paternal Grandmother     Social History:  reports that he quit smoking about 3 years ago. His smoking use included cigarettes. He has been exposed to tobacco smoke. He has never used smokeless tobacco. He reports current alcohol use of about 2.0 standard drinks of alcohol per week. He reports that he does not currently use drugs after having used the following drugs: Marijuana.  Allergies: No Known Allergies  Medications reviewed.    ROS Full ROS performed and is otherwise negative other than what is stated in HPI   BP 137/84   Pulse 80   Temp 98.3 F (36.8 C)   Ht 5\' 8"  (1.727 m)   Wt 207 lb (93.9 kg)   SpO2 98%   BMI 31.47 kg/m   Physical Exam Vitals  and nursing note reviewed. Exam conducted with a chaperone present.  Constitutional:      General: He is not in acute distress.    Appearance: Normal appearance. He is normal weight. He is not ill-appearing or toxic-appearing.  Cardiovascular:     Rate and Rhythm: Normal rate and regular rhythm.     Heart sounds: No murmur heard. Pulmonary:     Effort: Pulmonary effort is normal. No respiratory distress.     Breath sounds: Normal breath sounds. No stridor.  Abdominal:     General: Abdomen is flat. There is no distension.     Palpations: Abdomen is soft. There is no mass.     Tenderness: There is  no abdominal tenderness. There is no guarding or rebound.     Hernia: A hernia is present.     Comments: Evidence of a 3 cm partially incarcerated ventral hernia.  It is tender to palpation.  No evidence of infection no evidence of recurrence of his inguinal hernia.  Musculoskeletal:        General: Normal range of motion.     Cervical back: Normal range of motion. No rigidity or tenderness.  Skin:    General: Skin is warm and dry.     Capillary Refill: Capillary refill takes less than 2 seconds.  Neurological:     General: No focal deficit present.     Mental Status: He is alert and oriented to person, place, and time.  Psychiatric:        Mood and Affect: Mood normal.        Behavior: Behavior normal.        Thought Content: Thought content normal.        Judgment: Judgment normal.      Assessment/Plan: 42 year old male with symptomatic incarcerated ventral hernia from recent robotic inguinal hernia repair 6 months ago.  Discussed with the patient in detail about the need for repair.  I do think that we can do a good repair with mesh in an open fashion with he will be very safe.  Discussed with him in detail about the procedure.  The risk, the benefits and possible implications including but not limited to: Bleeding, infection, bowel injuries, chronic pain and mesh issues.  He understands and wished to proceed. Please note that I spent 40 minutes in this encounter including personally reviewing medical records, placing orders, counseling the patient and performing documentation  Sterling Big, MD [facility] General Surgeon

## 2023-07-23 NOTE — Progress Notes (Signed)
Outpatient Surgical Follow Up  07/23/2023  Luke [name] is an 42 y.o. male.   Chief Complaint  Patient presents with   Follow-up    HPI: Doing is a 42 year old male well-known to me 6 months out from a robotic inguinal hernia repair.  Now he feels a bulge.  He also reports some intermittent pain around the defect.  Pain is mild to moderate, sharp and worsening with Valsalva.  No evidence of strangulation.  No nausea no vomiting his taking p.o.  Past Medical History:  Diagnosis Date   Anemia    Cecum mass 08/15/2018   a.) 4.8 cm pedunculated cecal mass; Bx (+) for villous adeoma. b.) s/p RIGHT colectomy.   Chronic kidney disease    Colonic neoplasm    Diverticulosis of large intestine without diverticulitis    GERD (gastroesophageal reflux disease)    Hyperlipidemia    Hypertension    Pneumonia 09/07/2016   Sleep apnea    Stomach irritation    Type 2 diabetes mellitus treated with insulin [facility])     Past Surgical History:  Procedure Laterality Date   COLON SURGERY     COLONOSCOPY WITH PROPOFOL N/A 07/28/2018   Procedure: COLONOSCOPY WITH PROPOFOL;  Surgeon: Pasty Spillers, MD;  Location: [facility];  Service: Endoscopy;  Laterality: N/A;   COLONOSCOPY WITH PROPOFOL N/A 10/18/2019   Procedure: COLONOSCOPY WITH PROPOFOL;  Surgeon: Pasty Spillers, MD;  Location: [facility];  Service: Endoscopy;  Laterality: N/A;   COLONOSCOPY WITH PROPOFOL N/A 02/17/2021   Procedure: COLONOSCOPY WITH PROPOFOL;  Surgeon: Pasty Spillers, MD;  Location: [facility];  Service: Endoscopy;  Laterality: N/A;   ESOPHAGOGASTRODUODENOSCOPY (EGD) WITH PROPOFOL N/A 07/28/2018   Procedure: ESOPHAGOGASTRODUODENOSCOPY (EGD) WITH PROPOFOL;  Surgeon: Pasty Spillers, MD;  Location: [facility];  Service: Endoscopy;  Laterality: N/A;   PARTIAL COLECTOMY Right 08/15/2018   Procedure: RIGHT  COLECTOMY;  Surgeon: Ancil Linsey, MD;  Location: [facility] OR[facility];  Service: General;   Laterality: Right;   XI ROBOTIC ASSISTED INGUINAL HERNIA REPAIR WITH MESH Right 01/07/2023   Procedure: XI ROBOTIC ASSISTED INGUINAL HERNIA REPAIR WITH MESH, poss Bilateral;  Surgeon: Leafy Ro, MD;  Location: [facility] ORS;  Service: General;  Laterality: Right;  Possible Bilateral    Family History  Problem Relation Age of Onset   Heart disease Mother    Diabetes Mother    Cancer Mother    Breast cancer Mother    Hypertension Mother    Arthritis Father    Hypertension Father    Colon cancer Paternal Uncle    CAD Maternal Grandmother    Breast cancer Maternal Grandmother    Throat cancer Maternal Grandfather    Diabetes Paternal Grandmother     Social History:  reports that he quit smoking about 3 years ago. His smoking use included cigarettes. He has been exposed to tobacco smoke. He has never used smokeless tobacco. He reports current alcohol use of about 2.0 standard drinks of alcohol per week. He reports that he does not currently use drugs after having used the following drugs: Marijuana.  Allergies: No Known Allergies  Medications reviewed.    ROS Full ROS performed and is otherwise negative other than what is stated in HPI   BP 137/84   Pulse 80   Temp 98.3 F (36.8 C)   Ht 5\' 8"  (1.727 m)   Wt 207 lb (93.9 kg)   SpO2 98%   BMI 31.47 kg/m   Physical Exam Vitals  and nursing note reviewed. Exam conducted with a chaperone present.  Constitutional:      General: He is not in acute distress.    Appearance: Normal appearance. He is normal weight. He is not ill-appearing or toxic-appearing.  Cardiovascular:     Rate and Rhythm: Normal rate and regular rhythm.     Heart sounds: No murmur heard. Pulmonary:     Effort: Pulmonary effort is normal. No respiratory distress.     Breath sounds: Normal breath sounds. No stridor.  Abdominal:     General: Abdomen is flat. There is no distension.     Palpations: Abdomen is soft. There is no mass.     Tenderness: There is  no abdominal tenderness. There is no guarding or rebound.     Hernia: A hernia is present.     Comments: Evidence of a 3 cm partially incarcerated ventral hernia.  It is tender to palpation.  No evidence of infection no evidence of recurrence of his inguinal hernia.  Musculoskeletal:        General: Normal range of motion.     Cervical back: Normal range of motion. No rigidity or tenderness.  Skin:    General: Skin is warm and dry.     Capillary Refill: Capillary refill takes less than 2 seconds.  Neurological:     General: No focal deficit present.     Mental Status: He is alert and oriented to person, place, and time.  Psychiatric:        Mood and Affect: Mood normal.        Behavior: Behavior normal.        Thought Content: Thought content normal.        Judgment: Judgment normal.      Assessment/Plan: 42 year old male with symptomatic incarcerated ventral hernia from recent robotic inguinal hernia repair 6 months ago.  Discussed with the patient in detail about the need for repair.  I do think that we can do a good repair with mesh in an open fashion with he will be very safe.  Discussed with him in detail about the procedure.  The risk, the benefits and possible implications including but not limited to: Bleeding, infection, bowel injuries, chronic pain and mesh issues.  He understands and wished to proceed. Please note that I spent 40 minutes in this encounter including personally reviewing medical records, placing orders, counseling the patient and performing documentation  Sterling Big, MD [facility] General Surgeon

## 2023-07-26 ENCOUNTER — Other Ambulatory Visit: Payer: Self-pay

## 2023-07-26 ENCOUNTER — Other Ambulatory Visit: Payer: Self-pay | Admitting: Nurse Practitioner

## 2023-07-26 DIAGNOSIS — I10 Essential (primary) hypertension: Secondary | ICD-10-CM

## 2023-07-26 MED ORDER — AMLODIPINE BESYLATE 5 MG PO TABS
5.0000 mg | ORAL_TABLET | Freq: Every day | ORAL | 3 refills | Status: DC
  Filled 2023-07-26: qty 30, 30d supply, fill #0
  Filled 2023-08-27: qty 30, 30d supply, fill #1
  Filled 2023-10-01: qty 30, 30d supply, fill #2
  Filled 2023-11-01: qty 30, 30d supply, fill #3
  Filled 2023-11-29: qty 30, 30d supply, fill #4
  Filled 2024-01-03: qty 30, 30d supply, fill #5
  Filled 2024-02-28: qty 30, 30d supply, fill #6
  Filled 2024-03-31: qty 30, 30d supply, fill #7
  Filled 2024-05-08: qty 30, 30d supply, fill #8
  Filled 2024-06-12: qty 30, 30d supply, fill #9
  Filled 2024-07-07 – 2024-07-18 (×2): qty 30, 30d supply, fill #10

## 2023-07-27 ENCOUNTER — Encounter: Payer: Self-pay | Admitting: Emergency Medicine

## 2023-07-27 ENCOUNTER — Other Ambulatory Visit: Payer: Self-pay

## 2023-07-27 ENCOUNTER — Emergency Department: Payer: 59

## 2023-07-27 ENCOUNTER — Emergency Department
Admission: EM | Admit: 2023-07-27 | Discharge: 2023-07-27 | Disposition: A | Discharge status: Home / Self Care | Payer: 59 | Attending: Emergency Medicine | Admitting: Emergency Medicine

## 2023-07-27 DIAGNOSIS — E119 Type 2 diabetes mellitus without complications: Secondary | ICD-10-CM | POA: Diagnosis not present

## 2023-07-27 DIAGNOSIS — R0789 Other chest pain: Secondary | ICD-10-CM | POA: Insufficient documentation

## 2023-07-27 DIAGNOSIS — G4730 Sleep apnea, unspecified: Secondary | ICD-10-CM | POA: Diagnosis not present

## 2023-07-27 DIAGNOSIS — R0602 Shortness of breath: Secondary | ICD-10-CM | POA: Diagnosis not present

## 2023-07-27 DIAGNOSIS — Z85038 Personal history of other malignant neoplasm of large intestine: Secondary | ICD-10-CM | POA: Diagnosis not present

## 2023-07-27 DIAGNOSIS — R079 Chest pain, unspecified: Secondary | ICD-10-CM | POA: Diagnosis not present

## 2023-07-27 LAB — CBC
HCT: 46.1 % (ref 39.0–52.0)
Hemoglobin: 15.4 g/dL (ref 13.0–17.0)
MCH: 27.6 pg (ref 26.0–34.0)
MCHC: 33.4 g/dL (ref 30.0–36.0)
MCV: 82.6 fL (ref 80.0–100.0)
Platelets: 192 10*3/uL (ref 150–400)
RBC: 5.58 MIL/uL (ref 4.22–5.81)
RDW: 12.7 % (ref 11.5–15.5)
WBC: 5.4 10*3/uL (ref 4.0–10.5)
nRBC: 0 % (ref 0.0–0.2)

## 2023-07-27 LAB — BASIC METABOLIC PANEL
Anion gap: 12 (ref 5–15)
BUN: 10 mg/dL (ref 6–20)
CO2: 23 mmol/L (ref 22–32)
Calcium: 9 mg/dL (ref 8.9–10.3)
Chloride: 102 mmol/L (ref 98–111)
Creatinine, Ser: 1.06 mg/dL (ref 0.61–1.24)
GFR, Estimated: >60 mL/min (ref >60)
Glucose, Bld: 232 mg/dL — ABNORMAL HIGH (ref 70–99)
Potassium: 3.5 mmol/L (ref 3.5–5.1)
Sodium: 137 mmol/L (ref 135–145)

## 2023-07-27 LAB — TROPONIN I (HIGH SENSITIVITY): Troponin I (High Sensitivity): 4 ng/L (ref <18)

## 2023-07-27 NOTE — ED Provider Notes (Signed)
[facility] Provider Note   Event Date/Time   First MD Initiated Contact with Patient 07/27/23 1046     (approximate) History  Chest Pain  HPI Luke [name] is a 42 y.o. male with stated past medical history of type 2 diabetes, colon cancer status post surgery, and suspected sleep apnea who presents complaining of chest pain for the last week.  Patient describes an aching pain in the left side of his chest that usually occurs when he is sleeping and wakes him up from sleep.  Patient states that this pain improves after sitting up and walking around.  Patient endorses mild diaphoresis that does not persist throughout this pain.  Patient endorses similar episodes in the past that have not lasted as long.  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 ROS: Patient currently denies any vision changes, tinnitus, difficulty speaking, facial droop, sore throat, abdominal pain, nausea/vomiting/diarrhea, dysuria, or weakness/numbness/paresthesias in any extremity   Physical Exam  Triage Vital Signs: ED Triage Vitals  Encounter Vitals Group     BP 07/27/23 0951 (!) 161/108     Systolic BP Percentile --      Diastolic BP Percentile --      Pulse Rate 07/27/23 0951 80     Resp 07/27/23 0951 18     Temp 07/27/23 0951 98.3 F (36.8 C)     Temp src --      SpO2 07/27/23 0951 99 %     Weight 07/27/23 0950 200 lb (90.7 kg)     Height 07/27/23 0950 5\' 8"  (1.727 m)     Head Circumference --      Peak Flow --      Pain Score 07/27/23 0950 4     Pain Loc --      Pain Education --      Exclude from Growth Chart --    Most recent vital signs: Vitals:   07/27/23 0951  BP: (!) 161/108  Pulse: 80  Resp: 18  Temp: 98.3 F (36.8 C)  SpO2: 99%   General: Awake, oriented x4. CV:  Good peripheral perfusion.  Resp:  Normal effort.  Abd:  No distention.   Other:  Middle-aged overweight African-American male resting comfortably in no acute distress ED Results / Procedures / Treatments  Labs (all labs ordered are listed, but only abnormal results are displayed) Labs Reviewed  BASIC METABOLIC PANEL - Abnormal; Notable for the following components:      Result Value   Glucose, Bld 232 (*)    All other components within normal limits  CBC  TROPONIN I (HIGH SENSITIVITY)  TROPONIN I (HIGH SENSITIVITY)   EKG ED ECG REPORT I, Merwyn Katos, the attending physician, personally viewed and interpreted this ECG. Date: 07/27/2023 EKG Time: 0930 Rate: 73 Rhythm: normal sinus rhythm QRS Axis: normal Intervals: RBBB ST/T Wave abnormalities: normal Narrative Interpretation: no evidence of acute ischemia RADIOLOGY ED MD interpretation: 2 view chest x-ray interpreted by me shows no evidence of acute abnormalities including no pneumonia, pneumothorax, or widened mediastinum -Agree with radiology assessment Official radiology report(s): No results found. PROCEDURES: Critical Care performed: No .1-3 Lead EKG Interpretation  Performed by: Merwyn Katos, MD Authorized by: Merwyn Katos, MD     Interpretation: normal     ECG rate:  71   ECG rate assessment: normal     Rhythm: sinus rhythm  Ectopy: none     Conduction: normal   Comments:     RBBB  MEDICATIONS ORDERED IN ED: Medications - No data to display IMPRESSION / MDM / ASSESSMENT AND PLAN / ED COURSE  I reviewed the triage vital signs and the nursing notes.                             The patient is on the cardiac monitor to evaluate for evidence of arrhythmia and/or significant heart rate changes. Patient's presentation is most consistent with acute presentation with potential threat to life or bodily function. It is a 42 year old male with stated past medical right-sided chest pain has been sleeping in the setting likely undiagnosed sleep apnea Workup: ECG, CXR, CBC, BMP,  Troponin Findings: ECG: No overt evidence of STEMI. No evidence of Brugada's sign, delta wave, epsilon wave, significantly prolonged QTc, or malignant arrhythmia HS Troponin: Negative x1 Other Labs unremarkable for emergent problems. CXR: Without PTX, PNA, or widened mediastinum Last Stress Test: Never Last Heart Catheterization: Never HEART Score: 3  Given History, Exam, and Workup I have low suspicion for ACS, Pneumothorax, Pneumonia, Pulmonary Embolus, Tamponade, Aortic Dissection or other emergent problem as a cause for this presentation.   Reassesment: Prior to discharge patient's pain was controlled and they were well appearing.  Disposition:  Discharge. Strict return precautions discussed with patient with full understanding. Advised patient to follow up promptly with primary care provider    FINAL CLINICAL IMPRESSION(S) / ED DIAGNOSES   Final diagnoses:  Atypical chest pain  Sleep apnea, unspecified type   Rx / DC Orders   ED Discharge Orders     None      Note:  This document was prepared using Dragon voice recognition software and may include unintentional dictation errors.   Merwyn Katos, MD 07/27/23 [phone]

## 2023-07-27 NOTE — ED Triage Notes (Signed)
Pt via POV from home. Pt c/o mid-sternal CP that started 2 days ago, also report SOB. Denies any hx of cardiac disease. Non-radiating. Pt is A&Ox4 and NAd

## 2023-07-27 NOTE — ED Notes (Signed)
Pt to flex with right sided chest ache for 2-3 days, pt reports recently exposure to respiratory illness but intermittent chills. Pain 5/10 at this time. Pt placed on monitor and CB within reach.

## 2023-08-02 ENCOUNTER — Other Ambulatory Visit: Payer: Self-pay

## 2023-08-05 ENCOUNTER — Other Ambulatory Visit: Payer: Self-pay

## 2023-08-06 ENCOUNTER — Telehealth: Payer: Self-pay

## 2023-08-06 ENCOUNTER — Inpatient Hospital Stay
Admission: RE | Admit: 2023-08-06 | Discharge: 2023-08-06 | Disposition: A | Discharge status: Home / Self Care | Payer: 59 | Source: Ambulatory Visit

## 2023-08-06 HISTORY — DX: Polyp of stomach and duodenum: K31.7

## 2023-08-06 HISTORY — DX: Hyperlipidemia, unspecified: E78.5

## 2023-08-06 HISTORY — DX: Type 2 diabetes mellitus with other specified complication: E11.69

## 2023-08-06 HISTORY — DX: Other specified disease of esophagus: K22.89

## 2023-08-06 HISTORY — DX: Polyneuropathy, unspecified: G62.9

## 2023-08-06 HISTORY — DX: Deficiency of other specified B group vitamins: E53.8

## 2023-08-06 HISTORY — DX: Unilateral inguinal hernia, without obstruction or gangrene, not specified as recurrent: K40.90

## 2023-08-06 NOTE — Telephone Encounter (Signed)
ATC patient to ask if he has previous sleep study. Unable to leave vm due to mailbox not being setup.

## 2023-08-06 NOTE — Telephone Encounter (Signed)
No prev study

## 2023-08-06 NOTE — Patient Instructions (Addendum)
Your procedure is scheduled [id number] October 15 Report to the Registration Desk on the 1st floor of the [facility] To find out your arrival time, please call [phone] between 1PM - 3PM on: Monday October 14 If your arrival time is 6:00 am, do not arrive before that time as the [facility] entrance doors do not open until 6:00 am.  REMEMBER: Instructions that are not followed completely may result in serious medical risk, up to and including death; or upon the discretion of your surgeon and anesthesiologist your surgery may need to be rescheduled.  Do not eat food after midnight the night before surgery.  No gum chewing or hard candies.  One week prior to surgery: Tuesday October 8  Stop Anti-inflammatories (NSAIDS) such as Advil, Aleve, Ibuprofen, Motrin, Naproxen, Naprosyn and Aspirin based products such as Excedrin, Goody's Powder, BC Powder.  You may however, continue to take Tylenol if needed for pain up until the day of surgery.  **Follow guidelines for insulin and diabetes medications** Insulin Glargine (BASAGLAR KWIKPEN) take full bedtime dose insulin lispro (HUMALOG KWIKPEN) do not take any insulin the morning of surgery    TAKE ONLY THESE MEDICATIONS THE MORNING OF SURGERY WITH A SIP OF WATER:  pravastatin (PRAVACHOL)  amLODipine (NORVASC)   No Alcohol for 24 hours before or after surgery.  No Smoking including e-cigarettes for 24 hours before surgery.  No chewable tobacco products for at least 6 hours before surgery.  No nicotine patches on the day of surgery.  Do not use any "recreational" drugs for at least a week (preferably 2 weeks) before your surgery.  Please be advised that the combination of cocaine and anesthesia may have negative outcomes, up to and including death. If you test positive for cocaine, your surgery will be cancelled.  On the morning of surgery brush your teeth with toothpaste and water, you may rinse your mouth with mouthwash if you  wish. Do not swallow any toothpaste or mouthwash.  Use CHG Soap as directed on instruction sheet.  Do not wear jewelry, make-up, hairpins, clips or nail polish.  For welded (permanent) jewelry: bracelets, anklets, waist bands, etc.  Please have this removed prior to surgery.  If it is not removed, there is a chance that hospital personnel will need to cut it off on the day of surgery.  Do not wear lotions, powders, or perfumes.   Do not shave body hair from the neck down 48 hours before surgery.  Contact lenses, hearing aids and dentures may not be worn into surgery.  Do not bring valuables to the hospital. [facility] is not responsible for any missing/lost belongings or valuables.   Notify your doctor if there is any change in your medical condition (cold, fever, infection).  Wear comfortable clothing (specific to your surgery type) to the hospital.  After surgery, you can help prevent lung complications by doing breathing exercises.  Take deep breaths and cough every 1-2 hours.   When coughing or sneezing, hold a pillow firmly against your incision with both hands. This is called "splinting." Doing this helps protect your incision. It also decreases belly discomfort.  If you are being discharged the day of surgery, you will not be allowed to drive home. You will need a responsible individual to drive you home and stay with you for 24 hours after surgery.   If you are taking public transportation, you will need to have a responsible individual with you.  Please call the Pre-admissions Testing Dept.  at [phone] if you have any questions about these instructions.  Surgery Visitation Policy:  Patients having surgery or a procedure may have two visitors.  Children under the age of 40 must have an adult with them who is not the patient.

## 2023-08-09 ENCOUNTER — Ambulatory Visit (INDEPENDENT_AMBULATORY_CARE_PROVIDER_SITE_OTHER): Payer: 59 | Admitting: Primary Care

## 2023-08-09 ENCOUNTER — Other Ambulatory Visit: Payer: Self-pay

## 2023-08-09 ENCOUNTER — Encounter: Payer: Self-pay | Admitting: Primary Care

## 2023-08-09 ENCOUNTER — Encounter
Admission: RE | Admit: 2023-08-09 | Discharge: 2023-08-09 | Disposition: A | Discharge status: Home / Self Care | Payer: 59 | Source: Ambulatory Visit | Attending: Surgery | Admitting: Surgery

## 2023-08-09 VITALS — Ht 68.0 in | Wt 200.0 lb

## 2023-08-09 VITALS — BP 132/90 | HR 83 | Temp 97.9°F | Ht 68.0 in | Wt 207.6 lb

## 2023-08-09 DIAGNOSIS — R0683 Snoring: Secondary | ICD-10-CM | POA: Diagnosis not present

## 2023-08-09 DIAGNOSIS — Z9189 Other specified personal risk factors, not elsewhere classified: Secondary | ICD-10-CM | POA: Diagnosis not present

## 2023-08-09 DIAGNOSIS — Z01812 Encounter for preprocedural laboratory examination: Secondary | ICD-10-CM

## 2023-08-09 NOTE — Progress Notes (Signed)
@Patient  ID: Luke [name], male    DOB: 1981/04/28, 42 y.o.   MRN: [id number]  Chief Complaint  Patient presents with   Sleep Consult    Snores. Restless sleep. Wakes up gasping for air.    Referring provider: Kara Dies, NP  HPI: 42 year old male, former smoker. PMH significant for HTN, acid reflux, iron deficiency anemia, dyslipidemia, type 2 diabetes.   08/09/2023 Patient presents today for sleep consult. He is having episodes of apnea and daytime fatigue. He has been told that he stops breathing in his sleep and snores very loudly.  He has woken himself up feeling short of breath or gasping/choking.  No previous sleep studies.  Typical bedtime is between 12 and 3 AM.  It does not take him long to fall asleep.  He maybe wakes up once or twice a night.  He starts his day at 9 AM.  He falls asleep easily when in active. No overt concern for narcolepsy, cataplexy or sleep walking. No history seizures.   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 b.) s/p RIGHT colectomy.   Chronic kidney disease    Colonic neoplasm    Columnar-lined esophagus    Diverticulosis of large intestine without diverticulitis    Dyslipidemia (high LDL; low HDL)    Gastric polyp    GERD (gastroesophageal reflux disease)    Hyperlipidemia    Hyperlipidemia associated with type 2 diabetes mellitus (HCC)    Hypertension    Peripheral polyneuropathy    Pneumonia 09/07/2016    Sleep apnea    Stomach irritation    Type 2 diabetes mellitus treated with insulin (HCC)    Unilateral inguinal hernia, without obstruction or gangrene, not specified as recurrent     Tobacco History: Social History   Tobacco Use  Smoking Status Former   Current packs/day: 0.00   Types: Cigarettes   Quit date: 03/25/2020   Years since quitting: 3.3   Passive exposure: Past  Smokeless Tobacco Never  Tobacco Comments   patient is social smoker/ smokes when he drinks    Counseling given: Not Answered Tobacco comments: patient is social smoker/ smokes when he drinks    Outpatient Medications Prior to Visit  Medication Sig Dispense Refill   amLODipine (NORVASC) 5 MG tablet Take 1 tablet (5 mg total) by mouth daily. 90 tablet 3   blood glucose meter kit and supplies KIT Dispense based on patient and insurance preference. Use up to four times daily as directed. (FOR ICD-9 250.00, 250.01). 1 each 0   gabapentin (NEURONTIN) 300 MG capsule Take 1 capsule (300 mg total) by mouth at bedtime. 30 capsule 2   Insulin Glargine (BASAGLAR KWIKPEN) 100 UNIT/ML Inject 34 Units into the skin at bedtime. 15 mL 2   insulin lispro (HUMALOG KWIKPEN) 100 UNIT/ML KwikPen Inject 10 Units into the skin 3 (three) times daily. 9 mL 2   Insulin Pen Needle (NOVOFINE PEN NEEDLE) 32G X 6 MM  MISC use as directed 100 each 98   Insulin Pen Needle 32G X 4 MM MISC USE AS DIRECTED WITH INSULIN. 100 each PRN   pravastatin (PRAVACHOL) 80 MG tablet Take 1 tablet (80 mg total) by mouth daily. 30 tablet 2   No facility-administered medications prior to visit.    Review of Systems  Review of Systems  Constitutional:  Positive for fatigue.  Respiratory: Negative.    Cardiovascular: Negative.   Psychiatric/Behavioral:  Positive for sleep disturbance.    Physical Exam  BP (!) 132/90 (BP Location: Right Arm, Cuff Size: Large)   Pulse 83   Temp 97.9 F (36.6 C)   Ht 5\' 8"  (1.727 m)   Wt 207 lb 9.6 oz (94.2 kg)    SpO2 98%   BMI 31.57 kg/m  Physical Exam Constitutional:      General: He is not in acute distress.    Appearance: Normal appearance. He is not ill-appearing.  HENT:     Head: Normocephalic and atraumatic.     Mouth/Throat:     Mouth: Mucous membranes are moist.     Pharynx: Oropharynx is clear.  Cardiovascular:     Rate and Rhythm: Normal rate and regular rhythm.  Pulmonary:     Effort: Pulmonary effort is normal.     Breath sounds: Normal breath sounds.  Abdominal:     Comments: Ventral hernia   Musculoskeletal:        General: Normal range of motion.  Skin:    General: Skin is warm and dry.  Neurological:     General: No focal deficit present.     Mental Status: He is alert and oriented to person, place, and time. Mental status is at baseline.  Psychiatric:        Mood and Affect: Mood normal.        Behavior: Behavior normal.        Thought Content: Thought content normal.        Judgment: Judgment normal.      Lab Results:  CBC    Component Value Date/Time   WBC 5.4 07/27/2023 1000   RBC 5.58 07/27/2023 1000   HGB 15.4 07/27/2023 1000   HGB 15.7 03/19/2021 0934   HCT 46.1 07/27/2023 1000   HCT 47.3 03/19/2021 0934   PLT 192 07/27/2023 1000   PLT 173 03/19/2021 0934   MCV 82.6 07/27/2023 1000   MCV 87 03/19/2021 0934   MCV 62 (L) 04/23/2013 2126   MCH 27.6 07/27/2023 1000   MCHC 33.4 07/27/2023 1000   RDW 12.7 07/27/2023 1000   RDW 12.7 03/19/2021 0934   RDW 18.8 (H) 04/23/2013 2126   LYMPHSABS 2.3 03/19/2021 0934   MONOABS 0.6 09/13/2020 1442   EOSABS 0.1 03/19/2021 0934   BASOSABS 0.1 03/19/2021 0934    BMET    Component Value Date/Time   NA 137 07/27/2023 1000   NA 142 03/19/2021 0934   NA 138 04/23/2013 2126   K 3.5 07/27/2023 1000   K 3.6 04/23/2013 2126   CL 102 07/27/2023 1000   CL 103 04/23/2013 2126   CO2 23 07/27/2023 1000   CO2 26 04/23/2013 2126   GLUCOSE 232 (H) 07/27/2023 1000   GLUCOSE 188 (H) 04/23/2013 2126   BUN 10  07/27/2023 1000   BUN 9 03/19/2021 0934   BUN 15 04/23/2013 2126   CREATININE 1.06 07/27/2023 1000   CREATININE 1.78 (H) 04/23/2013 2126   CALCIUM 9.0 07/27/2023 1000   CALCIUM 9.2 04/23/2013  2126   GFRNONAA >60 07/27/2023 1000   GFRNONAA 49 (L) 04/23/2013 2126   GFRAA 72 12/18/2020 1012   GFRAA 57 (L) 04/23/2013 2126    BNP No results found for: "BNP"  ProBNP No results found for: "PROBNP"  Imaging: DG Chest 2 View  Result Date: 07/27/2023 CLINICAL DATA:  Chest pain.  Shortness of breath. EXAM: CHEST - 2 VIEW COMPARISON:  01/08/2023. FINDINGS: Bilateral lung fields are clear. Bilateral costophrenic angles are clear. Normal cardio-mediastinal silhouette. No acute osseous abnormalities. The soft tissues are within normal limits. IMPRESSION: No active cardiopulmonary disease. Electronically Signed   By: Jules Schick M.D.   On: 07/27/2023 13:05     Assessment & Plan:   At risk for sleep apnea - Patient has symptoms of witnessed apnea, loud snoring and excessive daytime sleepiness.  Epworth score 19.  BMI 31.  Moderate to high suspicion patient has underlying obstructive sleep apnea, needs home sleep study evaluate.  We reviewed risks of untreated sleep apnea including cardiac arrhythmias, pulmonary hypertension, diabetes and stroke.  Also discussed treatment options including weight loss, oral appliance, CPAP therapy referral to ENT for possible surgical options.  Encouraged weight loss as able and side sleeping position or elevate head of bed 30 degrees.  Advised against driving if experiencing excessive daytime sleepiness fatigue.  Follow-up 6 weeks to review sleep study results or treatment options if needed.   I can not determine surgical clearance as he does not currently have an active diagnosis of sleep apnea and is not wearing CPAP. Advised patient notify surgeon/anesthesiology that he is being worked up for OSA   Recommend 1. Short duration of surgery as much as possible and  avoid paralytic if possible 2. Recovery in step down or ICU with Pulmonary consultation if needed  3. DVT prophylaxis 4. Aggressive pulmonary toilet with o2, bronchodilatation, and incentive spirometry and early ambulation    Glenford Bayley, NP 08/09/2023

## 2023-08-09 NOTE — Patient Instructions (Addendum)
Your procedure is scheduled on: Tuesday October 15  Report to the Registration Desk on the 1st floor of the [facility] To find out your arrival time, please call [phone] between 1PM - 3PM on:  Monday October 14  If your arrival time is 6:00 am, do not arrive before that time as the [facility] entrance doors do not open until 6:00 am.  REMEMBER: Instructions that are not followed completely may result in serious medical risk, up to and including death; or upon the discretion of your surgeon and anesthesiologist your surgery may need to be rescheduled.  Do not eat food after midnight the night before surgery.  No gum chewing or hard candies.  You may however, drink WATER up to 2 hours before you are scheduled to arrive for your surgery. Do not drink anything within 2 hours of your scheduled arrival time.  One week prior to surgery:Tuesday October 8  Stop Anti-inflammatories (NSAIDS) such as Advil, Aleve, Ibuprofen, Motrin, Naproxen, Naprosyn and Aspirin based products such as Excedrin, Goody's Powder, BC Powder.  You may however, continue to take Tylenol if needed for pain up until the day of surgery.  Continue taking all prescribed medications with the exception of the following: Insulin Glargine [facility]ella S[facility]) Monday October 13 take 50% of Bedtime dose ( 17 Units)    insulin lispro (HUMALOG [facility]) - Monday October 13 Take usual dose before meals but DO NOT TAKE BEDTIME DOSE. DO NOT TAKE THE MORNING OF SURGERY.  (If glucose level is greater than 220 mg/dl, may take 1/2 of usual correction dose)    TAKE ONLY THESE MEDICATIONS THE MORNING OF SURGERY WITH A SIP OF WATER:  amLODipine (NORVASC)    No Alcohol for 24 hours before or after surgery.  No Smoking including e-cigarettes for 24 hours before surgery.  No chewable tobacco products for at least 6 hours before surgery.  No nicotine patches on the day of surgery.  Do not use any "recreational" drugs for at least a  week (preferably 2 weeks) before your surgery.  Please be advised that the combination of cocaine and anesthesia may have negative outcomes, up to and including death. If you test positive for cocaine, your surgery will be cancelled.  On the morning of surgery brush your teeth with toothpaste and water, you may rinse your mouth with mouthwash if you wish. Do not swallow any toothpaste or mouthwash.  Use CHG Soap as directed on instruction sheet.  Do not wear jewelry, make-up, hairpins, clips or nail polish.  For welded (permanent) jewelry: bracelets, anklets, waist bands, etc.  Please have this removed prior to surgery.  If it is not removed, there is a chance that hospital personnel will need to cut it off on the day of surgery.  Do not wear lotions, powders, or perfumes.   Do not shave body hair from the neck down 48 hours before surgery.  Contact lenses, hearing aids and dentures may not be worn into surgery.  Do not bring valuables to the hospital. [facility] is not responsible for any missing/lost belongings or valuables.   Notify your doctor if there is any change in your medical condition (cold, fever, infection).  Wear comfortable clothing (specific to your surgery type) to the hospital.  After surgery, you can help prevent lung complications by doing breathing exercises.  Take deep breaths and cough every 1-2 hours.   When coughing or sneezing, hold a pillow firmly against your incision with both hands. This is  called "splinting." Doing this helps protect your incision. It also decreases belly discomfort.  If you are being discharged the day of surgery, you will not be allowed to drive home. You will need a responsible individual to drive you home and stay with you for 24 hours after surgery.   If you are taking public transportation, you will need to have a responsible individual with you.  Please call the Pre-admissions Testing Dept. at [phone] if you have any  questions about these instructions.  Surgery Visitation Policy:  Patients having surgery or a procedure may have two visitors.  Children under the age of 25 must have an adult with them who is not the patient.          Preparing for Surgery with CHLORHEXIDINE GLUCONATE (CHG) Soap  Chlorhexidine Gluconate (CHG) Soap  o An antiseptic cleaner that kills germs and bonds with the skin to continue killing germs even after washing  o Used for showering the night before surgery and morning of surgery  Before surgery, you can play an important role by reducing the number of germs on your skin.  CHG (Chlorhexidine gluconate) soap is an antiseptic cleanser which kills germs and bonds with the skin to continue killing germs even after washing.  Please do not use if you have an allergy to CHG or antibacterial soaps. If your skin becomes reddened/irritated stop using the CHG.  1. Shower the NIGHT BEFORE SURGERY and the MORNING OF SURGERY with CHG soap.  2. If you choose to wash your hair, wash your hair first as usual with your normal shampoo.  3. After shampooing, rinse your hair and body thoroughly to remove the shampoo.  4. Use CHG as you would any other liquid soap. You can apply CHG directly to the skin and wash gently with a scrungie or a clean washcloth.  5. Apply the CHG soap to your body only from the neck down. Do not use on open wounds or open sores. Avoid contact with your eyes, ears, mouth, and genitals (private parts). Wash face and genitals (private parts) with your normal soap.  6. Wash thoroughly, paying special attention to the area where your surgery will be performed.  7. Thoroughly rinse your body with warm water.  8. Do not shower/wash with your normal soap after using and rinsing off the CHG soap.  9. Pat yourself dry with a clean towel.  10. Wear clean pajamas to bed the night before surgery.  12. Place clean sheets on your bed the night of your first shower  and do not sleep with pets.  13. Shower again with the CHG soap on the day of surgery prior to arriving at the hospital.  14. Do not apply any deodorants/lotions/powders.  15. Please wear clean clothes to the hospital.

## 2023-08-09 NOTE — Patient Instructions (Addendum)
Moderate-high clinical suspicion for sleep apnea  I can not determine surgical clearance as you do not currently have an active diagnosis of sleep apnea and are not wearing CPAP   I would make sure anesthesiology knows you are being worked up for OSA   Focus on side sleeping position Avoid alcohol or sedative prior to bedtime as these can worsen underlying sleep apnea Work on weight loss  Orders: HST re: loud snoring (ordered)/ please given SNAP pamphlet   Follow-up Virtual visit with Beth NP on Friday afternoon in 4-6 weeks    Sleep Apnea  Sleep apnea is a condition that affects your breathing while you are sleeping. Your tongue or soft tissue in your throat may block the flow of air while you sleep. You may have shallow breathing or stop breathing for short periods of time. People with sleep apnea may snore loudly. There are three kinds of sleep apnea: Obstructive sleep apnea. This kind is caused by a blocked or collapsed airway. This is the most common. Central sleep apnea. This kind happens when the part of the brain that controls breathing does not send the correct signals to the muscles that control breathing. Mixed sleep apnea. This is a combination of obstructive and central sleep apnea. What are the causes? The most common cause of sleep apnea is a collapsed or blocked airway. What increases the risk? Being very overweight. Having family members with sleep apnea. Having a tongue or tonsils that are larger than normal. Having a small airway or jaw problems. Being older. What are the signs or symptoms? Loud snoring. Restless sleep. Trouble staying asleep. Being sleepy or tired during the day. Waking up gasping or choking. Having a headache in the morning. Mood swings. Having a hard time remembering things and concentrating. How is this diagnosed? A medical history. A physical exam. A sleep study. This is also called a polysomnography test. This test is done at a  sleep lab or in your home while you are sleeping. How is this treated? Treatment may include: Sleeping on your side. Losing weight if you're overweight. Wearing an oral appliance. This is a mouthpiece that moves your lower jaw forward. Using a positive airway pressure (PAP) device to keep your airways open while you sleep, such as: A continuous positive airway pressure (CPAP) device. This device gives forced air through a mask when you breathe out. This keeps your airways open. A bilevel positive airway pressure (BIPAP) device. This device gives forced air through a mask when you breathe in and when you breathe out to keep your airways open. Having surgery if other treatments do not work. If your sleep apnea is not treated, you may be at risk for: Heart failure. Heart attack. Stroke. Type 2 diabetes or a problem with your blood sugar called insulin resistance. Follow these instructions at home: Medicines Take your medicines only as told by your health care provider. Avoid alcohol, medicines to help you relax, and certain pain medicines. These may make sleep apnea worse. General instructions Do not smoke, vape, or use products with nicotine or tobacco in them. If you need help quitting, talk with your provider. If you were given a PAP device to open your airway while you sleep, use it as told by your provider. If you're having surgery, make sure to tell your provider you have sleep apnea. You may need to bring your PAP device with you. Contact a health care provider if: The PAP device that you were given to use  during sleep bothers you or does not seem to be working. You do not feel better or you feel worse. Get help right away if: You have trouble breathing. You have chest pain. You have trouble talking. One side of your body feels weak. A part of your face is hanging down. These symptoms may be an emergency. Call 911 right away. Do not wait to see if the symptoms will go away. Do  not drive yourself to the hospital. This information is not intended to replace advice given to you by your health care provider. Make sure you discuss any questions you have with your health care provider. Document Revised: 12/24/2022 Document Reviewed: 12/24/2022 Elsevier Patient Education  2024 [employer].

## 2023-08-09 NOTE — Assessment & Plan Note (Addendum)
-   Patient has symptoms of witnessed apnea, loud snoring and excessive daytime sleepiness.  Epworth score 19.  BMI 31.  Moderate to high suspicion patient has underlying obstructive sleep apnea, needs home sleep study evaluate.  We reviewed risks of untreated sleep apnea including cardiac arrhythmias, pulmonary hypertension, diabetes and stroke.  Also discussed treatment options including weight loss, oral appliance, CPAP therapy referral to ENT for possible surgical options.  Encouraged weight loss as able and side sleeping position or elevate head of bed 30 degrees.  Advised against driving if experiencing excessive daytime sleepiness fatigue.  Follow-up 6 weeks to review sleep study results or treatment options if needed.   I can not determine surgical clearance as he does not currently have an active diagnosis of sleep apnea and is not wearing CPAP. Advised patient notify surgeon/anesthesiology that he is being worked up for OSA   Recommend 1. Short duration of surgery as much as possible and avoid paralytic if possible 2. Recovery in step down or ICU with Pulmonary consultation if needed  3. DVT prophylaxis 4. Aggressive pulmonary toilet with o2, bronchodilatation, and incentive spirometry and early ambulation

## 2023-08-17 ENCOUNTER — Other Ambulatory Visit: Payer: Self-pay

## 2023-08-17 ENCOUNTER — Encounter: Payer: Self-pay | Admitting: Surgery

## 2023-08-17 ENCOUNTER — Encounter
Admission: RE | Disposition: A | Discharge status: Home / Self Care | Payer: Self-pay | Source: Ambulatory Visit | Attending: Surgery

## 2023-08-17 ENCOUNTER — Ambulatory Visit
Admission: RE | Admit: 2023-08-17 | Discharge: 2023-08-17 | Disposition: A | Discharge status: Home / Self Care | Payer: 59 | Source: Ambulatory Visit | Attending: Surgery | Admitting: Surgery

## 2023-08-17 ENCOUNTER — Ambulatory Visit: Payer: 59 | Admitting: Urgent Care

## 2023-08-17 ENCOUNTER — Ambulatory Visit: Payer: 59 | Admitting: Certified Registered Nurse Anesthetist

## 2023-08-17 DIAGNOSIS — E1165 Type 2 diabetes mellitus with hyperglycemia: Secondary | ICD-10-CM | POA: Diagnosis not present

## 2023-08-17 DIAGNOSIS — N189 Chronic kidney disease, unspecified: Secondary | ICD-10-CM | POA: Insufficient documentation

## 2023-08-17 DIAGNOSIS — Z794 Long term (current) use of insulin: Secondary | ICD-10-CM | POA: Diagnosis not present

## 2023-08-17 DIAGNOSIS — Z6831 Body mass index (BMI) 31.0-31.9, adult: Secondary | ICD-10-CM | POA: Insufficient documentation

## 2023-08-17 DIAGNOSIS — I129 Hypertensive chronic kidney disease with stage 1 through stage 4 chronic kidney disease, or unspecified chronic kidney disease: Secondary | ICD-10-CM | POA: Diagnosis not present

## 2023-08-17 DIAGNOSIS — K436 Other and unspecified ventral hernia with obstruction, without gangrene: Secondary | ICD-10-CM | POA: Diagnosis present

## 2023-08-17 DIAGNOSIS — E669 Obesity, unspecified: Secondary | ICD-10-CM | POA: Insufficient documentation

## 2023-08-17 DIAGNOSIS — Z87891 Personal history of nicotine dependence: Secondary | ICD-10-CM | POA: Diagnosis not present

## 2023-08-17 DIAGNOSIS — Z01812 Encounter for preprocedural laboratory examination: Secondary | ICD-10-CM

## 2023-08-17 DIAGNOSIS — E1122 Type 2 diabetes mellitus with diabetic chronic kidney disease: Secondary | ICD-10-CM | POA: Diagnosis not present

## 2023-08-17 HISTORY — PX: VENTRAL HERNIA REPAIR: SHX424

## 2023-08-17 LAB — GLUCOSE, CAPILLARY
Glucose-Capillary: 289 mg/dL — ABNORMAL HIGH (ref 70–99)
Glucose-Capillary: 257 mg/dL — ABNORMAL HIGH (ref 70–99)

## 2023-08-17 SURGERY — REPAIR, HERNIA, VENTRAL
Anesthesia: General | Site: Abdomen

## 2023-08-17 MED ORDER — CHLORHEXIDINE GLUCONATE CLOTH 2 % EX PADS: 6.0000 | MEDICATED_PAD | Freq: Once | CUTANEOUS | Status: DC

## 2023-08-17 MED ORDER — FAMOTIDINE 20 MG PO TABS
ORAL_TABLET | ORAL | Status: AC
  Filled 2023-08-17: qty 1

## 2023-08-17 MED ORDER — OXYCODONE HCL 5 MG/5ML PO SOLN: 5.0000 mg | Freq: Once | ORAL | Status: DC | PRN

## 2023-08-17 MED ORDER — EPHEDRINE 5 MG/ML INJ
INTRAVENOUS | Status: AC
  Filled 2023-08-17: qty 5

## 2023-08-17 MED ORDER — CELECOXIB 200 MG PO CAPS
200.0000 mg | ORAL_CAPSULE | ORAL | Status: AC
  Administered 2023-08-17: 200 mg via ORAL

## 2023-08-17 MED ORDER — ONDANSETRON HCL 4 MG/2ML IJ SOLN: 4.0000 mg | Freq: Once | INTRAMUSCULAR | Status: DC | PRN

## 2023-08-17 MED ORDER — OXYCODONE HCL 5 MG PO TABS: 5.0000 mg | ORAL_TABLET | Freq: Once | ORAL | Status: DC | PRN

## 2023-08-17 MED ORDER — BUPIVACAINE-EPINEPHRINE (PF) 0.25% -1:200000 IJ SOLN
INTRAMUSCULAR | Status: AC
  Filled 2023-08-17: qty 30

## 2023-08-17 MED ORDER — 0.9 % SODIUM CHLORIDE (POUR BTL) OPTIME
TOPICAL | Status: DC | PRN
  Administered 2023-08-17: 500 mL

## 2023-08-17 MED ORDER — GLYCOPYRROLATE 0.2 MG/ML IJ SOLN
INTRAMUSCULAR | Status: DC | PRN
  Administered 2023-08-17: .2 mg via INTRAVENOUS

## 2023-08-17 MED ORDER — DEXMEDETOMIDINE HCL IN NACL 80 MCG/20ML IV SOLN
INTRAVENOUS | Status: AC
  Filled 2023-08-17: qty 20

## 2023-08-17 MED ORDER — DEXAMETHASONE SODIUM PHOSPHATE 10 MG/ML IJ SOLN
INTRAMUSCULAR | Status: DC | PRN
  Administered 2023-08-17: 5 mg via INTRAVENOUS

## 2023-08-17 MED ORDER — MIDAZOLAM HCL 2 MG/2ML IJ SOLN
INTRAMUSCULAR | Status: DC | PRN
  Administered 2023-08-17: 2 mg via INTRAVENOUS

## 2023-08-17 MED ORDER — INSULIN ASPART 100 UNIT/ML IJ SOLN
8.0000 [IU] | Freq: Once | INTRAMUSCULAR | Status: AC
  Administered 2023-08-17: 8 [IU] via SUBCUTANEOUS

## 2023-08-17 MED ORDER — GLYCOPYRROLATE 0.2 MG/ML IJ SOLN
INTRAMUSCULAR | Status: AC
  Filled 2023-08-17: qty 1

## 2023-08-17 MED ORDER — FENTANYL CITRATE (PF) 100 MCG/2ML IJ SOLN
INTRAMUSCULAR | Status: AC
  Filled 2023-08-17: qty 2

## 2023-08-17 MED ORDER — ACETAMINOPHEN 500 MG PO TABS
ORAL_TABLET | ORAL | Status: AC
  Filled 2023-08-17: qty 2

## 2023-08-17 MED ORDER — ACETAMINOPHEN 10 MG/ML IV SOLN: 1000.0000 mg | Freq: Once | INTRAVENOUS | Status: DC | PRN

## 2023-08-17 MED ORDER — CHLORHEXIDINE GLUCONATE 0.12 % MT SOLN
OROMUCOSAL | Status: AC
  Filled 2023-08-17: qty 15

## 2023-08-17 MED ORDER — FAMOTIDINE 20 MG PO TABS
20.0000 mg | ORAL_TABLET | Freq: Once | ORAL | Status: AC
  Administered 2023-08-17: 20 mg via ORAL

## 2023-08-17 MED ORDER — ROCURONIUM BROMIDE 100 MG/10ML IV SOLN
INTRAVENOUS | Status: DC | PRN
  Administered 2023-08-17: 10 mg via INTRAVENOUS
  Administered 2023-08-17: 30 mg via INTRAVENOUS
  Administered 2023-08-17: 10 mg via INTRAVENOUS

## 2023-08-17 MED ORDER — GABAPENTIN 300 MG PO CAPS
ORAL_CAPSULE | ORAL | Status: AC
  Filled 2023-08-17: qty 1

## 2023-08-17 MED ORDER — EPHEDRINE SULFATE-NACL 50-0.9 MG/10ML-% IV SOSY
PREFILLED_SYRINGE | INTRAVENOUS | Status: DC | PRN
  Administered 2023-08-17 (×4): 5 mg via INTRAVENOUS

## 2023-08-17 MED ORDER — FENTANYL CITRATE (PF) 100 MCG/2ML IJ SOLN
INTRAMUSCULAR | Status: DC | PRN
  Administered 2023-08-17 (×2): 50 ug via INTRAVENOUS

## 2023-08-17 MED ORDER — PHENYLEPHRINE 80 MCG/ML (10ML) SYRINGE FOR IV PUSH (FOR BLOOD PRESSURE SUPPORT)
PREFILLED_SYRINGE | INTRAVENOUS | Status: DC | PRN
  Administered 2023-08-17 (×3): 80 ug via INTRAVENOUS
  Administered 2023-08-17: 160 ug via INTRAVENOUS
  Administered 2023-08-17: 80 ug via INTRAVENOUS

## 2023-08-17 MED ORDER — LIDOCAINE HCL (PF) 2 % IJ SOLN
INTRAMUSCULAR | Status: AC
  Filled 2023-08-17: qty 5

## 2023-08-17 MED ORDER — PHENYLEPHRINE 80 MCG/ML (10ML) SYRINGE FOR IV PUSH (FOR BLOOD PRESSURE SUPPORT)
PREFILLED_SYRINGE | INTRAVENOUS | Status: AC
  Filled 2023-08-17: qty 10

## 2023-08-17 MED ORDER — HYDROCODONE-ACETAMINOPHEN 5-325 MG PO TABS: 1.0000 | ORAL_TABLET | ORAL | 0 refills | Status: DC | PRN

## 2023-08-17 MED ORDER — BUPIVACAINE-EPINEPHRINE 0.25% -1:200000 IJ SOLN
INTRAMUSCULAR | Status: DC | PRN
  Administered 2023-08-17: 50 mL via SURGICAL_CAVITY

## 2023-08-17 MED ORDER — INSULIN ASPART 100 UNIT/ML IJ SOLN
INTRAMUSCULAR | Status: AC
  Filled 2023-08-17: qty 1

## 2023-08-17 MED ORDER — LACTATED RINGERS IV SOLN: INTRAVENOUS | Status: DC

## 2023-08-17 MED ORDER — LIDOCAINE HCL (CARDIAC) PF 100 MG/5ML IV SOSY
PREFILLED_SYRINGE | INTRAVENOUS | Status: DC | PRN
  Administered 2023-08-17: 100 mg via INTRAVENOUS

## 2023-08-17 MED ORDER — ACETAMINOPHEN 500 MG PO TABS
1000.0000 mg | ORAL_TABLET | ORAL | Status: AC
  Administered 2023-08-17: 1000 mg via ORAL

## 2023-08-17 MED ORDER — SUCCINYLCHOLINE CHLORIDE 200 MG/10ML IV SOSY
PREFILLED_SYRINGE | INTRAVENOUS | Status: DC | PRN
  Administered 2023-08-17: 100 mg via INTRAVENOUS

## 2023-08-17 MED ORDER — CELECOXIB 200 MG PO CAPS
ORAL_CAPSULE | ORAL | Status: AC
  Filled 2023-08-17: qty 1

## 2023-08-17 MED ORDER — ROCURONIUM BROMIDE 10 MG/ML (PF) SYRINGE
PREFILLED_SYRINGE | INTRAVENOUS | Status: AC
  Filled 2023-08-17: qty 10

## 2023-08-17 MED ORDER — GABAPENTIN 300 MG PO CAPS
300.0000 mg | ORAL_CAPSULE | ORAL | Status: AC
  Administered 2023-08-17: 300 mg via ORAL

## 2023-08-17 MED ORDER — DEXMEDETOMIDINE HCL IN NACL 200 MCG/50ML IV SOLN
INTRAVENOUS | Status: DC | PRN
Start: 2023-08-17 — End: 2023-08-17
  Administered 2023-08-17 (×2): 8 ug via INTRAVENOUS

## 2023-08-17 MED ORDER — CEFAZOLIN SODIUM-DEXTROSE 2-4 GM/100ML-% IV SOLN
2.0000 g | INTRAVENOUS | Status: AC
  Administered 2023-08-17: 2 g via INTRAVENOUS

## 2023-08-17 MED ORDER — PROPOFOL 10 MG/ML IV BOLUS
INTRAVENOUS | Status: AC
  Filled 2023-08-17: qty 20

## 2023-08-17 MED ORDER — PROPOFOL 10 MG/ML IV BOLUS
INTRAVENOUS | Status: DC | PRN
  Administered 2023-08-17: 180 mg via INTRAVENOUS

## 2023-08-17 MED ORDER — ORAL CARE MOUTH RINSE: 15.0000 mL | Freq: Once | OROMUCOSAL | Status: AC

## 2023-08-17 MED ORDER — ONDANSETRON HCL 4 MG/2ML IJ SOLN
INTRAMUSCULAR | Status: AC
  Filled 2023-08-17: qty 2

## 2023-08-17 MED ORDER — ONDANSETRON HCL 4 MG/2ML IJ SOLN
INTRAMUSCULAR | Status: DC | PRN
  Administered 2023-08-17: 4 mg via INTRAVENOUS

## 2023-08-17 MED ORDER — CHLORHEXIDINE GLUCONATE 0.12 % MT SOLN
15.0000 mL | Freq: Once | OROMUCOSAL | Status: AC
  Administered 2023-08-17: 15 mL via OROMUCOSAL

## 2023-08-17 MED ORDER — HYDROCODONE-ACETAMINOPHEN 5-325 MG PO TABS
1.0000 | ORAL_TABLET | ORAL | 0 refills | Status: DC | PRN
  Filled 2023-08-17: qty 20, 3d supply, fill #0

## 2023-08-17 MED ORDER — SODIUM CHLORIDE 0.9 % IV SOLN: INTRAVENOUS | Status: DC

## 2023-08-17 MED ORDER — BUPIVACAINE LIPOSOME 1.3 % IJ SUSP
INTRAMUSCULAR | Status: AC
  Filled 2023-08-17: qty 20

## 2023-08-17 MED ORDER — CEFAZOLIN SODIUM-DEXTROSE 2-4 GM/100ML-% IV SOLN
INTRAVENOUS | Status: AC
  Filled 2023-08-17: qty 100

## 2023-08-17 MED ORDER — FENTANYL CITRATE (PF) 100 MCG/2ML IJ SOLN: 25.0000 ug | INTRAMUSCULAR | Status: DC | PRN

## 2023-08-17 MED ORDER — SUGAMMADEX SODIUM 200 MG/2ML IV SOLN
INTRAVENOUS | Status: DC | PRN
  Administered 2023-08-17: 181.4 mg via INTRAVENOUS

## 2023-08-17 MED ORDER — MIDAZOLAM HCL 2 MG/2ML IJ SOLN
INTRAMUSCULAR | Status: AC
  Filled 2023-08-17: qty 2

## 2023-08-17 MED ORDER — DEXAMETHASONE SODIUM PHOSPHATE 10 MG/ML IJ SOLN
INTRAMUSCULAR | Status: AC
  Filled 2023-08-17: qty 1

## 2023-08-17 SURGICAL SUPPLY — 41 items
APL PRP STRL LF DISP 70% ISPRP (MISCELLANEOUS) ×1
APPLIER CLIP 11 MED OPEN (CLIP) ×1
APPLIER CLIP 13 LRG OPEN (CLIP) ×1
APR CLP LRG 13 20 CLIP (CLIP) ×1
APR CLP MED 11 20 MLT OPN (CLIP) ×1
BLADE CLIPPER SURG (BLADE) ×1 IMPLANT
CHLORAPREP W/TINT 26 (MISCELLANEOUS) ×1 IMPLANT
CLIP APPLIE 11 MED OPEN (CLIP) ×1 IMPLANT
CLIP APPLIE 13 LRG OPEN (CLIP) ×1 IMPLANT
DRAPE LAPAROTOMY 100X77 ABD (DRAPES) ×1 IMPLANT
DRSG TELFA 3X8 NADH STRL (GAUZE/BANDAGES/DRESSINGS) ×1 IMPLANT
ELECT CAUTERY BLADE 6.4 (BLADE) ×1 IMPLANT
ELECT REM PT RETURN 9FT ADLT (ELECTROSURGICAL) ×1
ELECTRODE REM PT RTRN 9FT ADLT (ELECTROSURGICAL) ×1 IMPLANT
GAUZE 4X4 16PLY ~~LOC~~+RFID DBL (SPONGE) ×1 IMPLANT
GAUZE SPONGE 4X4 12PLY STRL (GAUZE/BANDAGES/DRESSINGS) ×1 IMPLANT
GLOVE BIO SURGEON STRL SZ7 (GLOVE) ×1 IMPLANT
GOWN STRL REUS W/ TWL LRG LVL3 (GOWN DISPOSABLE) ×2 IMPLANT
GOWN STRL REUS W/TWL LRG LVL3 (GOWN DISPOSABLE) ×2
MANIFOLD NEPTUNE II (INSTRUMENTS) ×1 IMPLANT
MESH VENTRALEX ST 2.5 CRC MED (Mesh General) IMPLANT
NDL HYPO 22X1.5 SAFETY MO (MISCELLANEOUS) ×1 IMPLANT
NDL HYPO 25X1 1.5 SAFETY (NEEDLE) ×1 IMPLANT
NDL MAYO CATGUT SZ 2 (NEEDLE) IMPLANT
NEEDLE HYPO 22X1.5 SAFETY MO (MISCELLANEOUS) ×1
NEEDLE HYPO 25X1 1.5 SAFETY (NEEDLE) ×1
NEEDLE MAYO CATGUT SZ 1.5 (NEEDLE) ×1
PACK BASIN MINOR ARMC (MISCELLANEOUS) ×1 IMPLANT
SPONGE T-LAP 18X18 ~~LOC~~+RFID (SPONGE) ×1 IMPLANT
STAPLER SKIN PROX 35W (STAPLE) ×1 IMPLANT
SUT ETHIBOND 0 MO6 C/R (SUTURE) ×1 IMPLANT
SUT MNCRL 4-0 (SUTURE) ×1
SUT MNCRL 4-0 27XMFL (SUTURE) ×1
SUT VIC AB 2-0 SH 27 (SUTURE) ×2
SUT VIC AB 2-0 SH 27XBRD (SUTURE) ×2 IMPLANT
SUTURE MNCRL 4-0 27XMF (SUTURE) IMPLANT
SYR 20ML LL LF (SYRINGE) ×1 IMPLANT
TAPE MICROFOAM 4IN (TAPE) ×1 IMPLANT
TRAP FLUID SMOKE EVACUATOR (MISCELLANEOUS) ×1 IMPLANT
WATER STERILE IRR 1000ML POUR (IV SOLUTION) ×1 IMPLANT
WATER STERILE IRR 500ML POUR (IV SOLUTION) ×1 IMPLANT

## 2023-08-17 NOTE — Anesthesia Preprocedure Evaluation (Addendum)
Anesthesia Evaluation  Patient identified by MRN, date of birth, ID band Patient awake    Reviewed: Allergy & Precautions, NPO status , Patient's Chart, lab work & pertinent test results  History of Anesthesia Complications Negative for: history of anesthetic complications  Airway Mallampati: III   Neck ROM: Full    Dental no notable dental hx.    Pulmonary sleep apnea , former smoker (quit 2021)   Pulmonary exam normal breath sounds clear to auscultation       Cardiovascular hypertension, Normal cardiovascular exam Rhythm:Regular Rate:Normal  ECG 07/27/23:  Normal sinus rhythm Left axis deviation Right bundle branch block Inferior infarct , age undetermined   Neuro/Psych negative neurological ROS     GI/Hepatic ,GERD  ,,  Endo/Other  diabetes, Poorly Controlled, Type 2, Insulin Dependent  Obesity   Renal/GU Renal disease (CKD)     Musculoskeletal   Abdominal   Peds  Hematology  (+) Blood dyscrasia, anemia   Anesthesia Other Findings   Reproductive/Obstetrics                             Anesthesia Physical Anesthesia Plan  ASA: 3  Anesthesia Plan: General   Post-op Pain Management:    Induction: Intravenous  PONV Risk Score and Plan: 2 and Ondansetron, Dexamethasone and Treatment may vary due to age or medical condition  Airway Management Planned: Oral ETT  Additional Equipment:   Intra-op Plan:   Post-operative Plan: Extubation in OR  Informed Consent: I have reviewed the patients History and Physical, chart, labs and discussed the procedure including the risks, benefits and alternatives for the proposed anesthesia with the patient or authorized representative who has indicated his/her understanding and acceptance.     Dental advisory given  Plan Discussed with: CRNA  Anesthesia Plan Comments: (Patient consented for risks of anesthesia including but not limited to:   - adverse reactions to medications - damage to eyes, teeth, lips or other oral mucosa - nerve damage due to positioning  - sore throat or hoarseness - damage to heart, brain, nerves, lungs, other parts of body or loss of life  Informed patient about role of CRNA in peri- and intra-operative care.  Patient voiced understanding.)        Anesthesia Quick Evaluation

## 2023-08-17 NOTE — Op Note (Signed)
Ventral Hernia Repair with Mesh ventralex  Pre-operative Diagnosis: 3 cm ventral hernia  Post-operative Diagnosis: same  Surgeon: Sterling Big, MD FACS  Anesthesia: Gen. with endotracheal tube   Findings: 3 cm incarcerated ventral hernia  Estimated Blood Loss: 5cc                Specimens: sac          Complications: none              Procedure Details  The patient was seen again in the Holding Room. The benefits, complications, treatment options, and expected outcomes were discussed with the patient. The risks of bleeding, infection, recurrence of symptoms, failure to resolve symptoms, bowel injury, mesh placement, mesh infection, any of which could require further surgery were reviewed with the patient. The likelihood of improving the patient's symptoms with return to their baseline status is good.  The patient and/or family concurred with the proposed plan, giving informed consent.  The patient was taken to Operating Room, identified and the procedure verified.  A Time Out was held and the above information confirmed.  Prior to the induction of general anesthesia, antibiotic prophylaxis was administered. VTE prophylaxis was in place. General endotracheal anesthesia was then administered and tolerated well. After the induction, the abdomen was prepped with Chloraprep and draped in the sterile fashion. The patient was positioned in the supine position.  Incision was created with a scalpel over the hernia defect. Electrocautery was used to dissect through subcutaneous tissue, the hernia sac was opened excised, chronic omentum was incarcerated and excised as well/.   The hernia was measured. A BARD 6.4cm Ventralex  patch selected and secure to the fascia using 4 corner transfacial sutures. Mesh laid flat and nicely against the abdominal wall. I closed the hernia defect with interrupted 0 Ethibond sutures.  Incision was closed in a 2 layer fashion with 3-0 Vicryl and 4-0 Monocryl.  Dermabond was used to coat the skin. Liposomal Marcaine was used to inject the abdominal wall under direct visualization. Patient tolerated procedure well and there were no immediate complications. Needle and laparotomy counts were correct

## 2023-08-17 NOTE — Transfer of Care (Signed)
Immediate Anesthesia Transfer of Care Note  Patient: RONIEL HALLORAN  Procedure(s) Performed: HERNIA REPAIR VENTRAL ADULT, open (Abdomen)  Patient Location: PACU  Anesthesia Type:General  Level of Consciousness: drowsy  Airway & Oxygen Therapy: Patient Spontanous Breathing and Patient connected to face mask oxygen  Post-op Assessment: Report given to RN and Post -op Vital signs reviewed and stable  Post vital signs: Reviewed and stable  Last Vitals:  Vitals Value Taken Time  BP 132/89 08/17/23 1230  Temp    Pulse 80 08/17/23 1231  Resp 24 08/17/23 1231  SpO2 100 % 08/17/23 1231  Vitals shown include unfiled device data.  Last Pain:  Vitals:   08/17/23 0851  PainSc: 0-No pain         Complications: No notable events documented.

## 2023-08-17 NOTE — Interval H&P Note (Signed)
History and Physical Interval Note:  08/17/2023 10:22 AM  Luke [name]  has presented today for surgery, with the diagnosis of 3 cm ventral hernia incarcerated.  The various methods of treatment have been discussed with the patient and family. After consideration of risks, benefits and other options for treatment, the patient has consented to  Procedure(s): HERNIA REPAIR VENTRAL ADULT, open (N/A) as a surgical intervention.  The patient's history has been reviewed, patient examined, no change in status, stable for surgery.  I have reviewed the patient's chart and labs.  Questions were answered to the patient's satisfaction.     Delshawn Stech F Fatimata Talsma

## 2023-08-17 NOTE — Anesthesia Procedure Notes (Signed)
Procedure Name: Intubation Date/Time: 08/17/2023 11:08 AM  Performed by: Malva Cogan, CRNAPre-anesthesia Checklist: Patient identified, Patient being monitored, Timeout performed, Emergency Drugs available and Suction available Patient Re-evaluated:Patient Re-evaluated prior to induction Oxygen Delivery Method: Circle system utilized Preoxygenation: Pre-oxygenation with 100% oxygen Induction Type: IV induction Ventilation: Mask ventilation without difficulty, Oral airway inserted - appropriate to patient size and Two handed mask ventilation required Laryngoscope Size: McGraph and 4 Grade View: Grade I Tube type: Oral Tube size: 7.5 mm Number of attempts: 1 Airway Equipment and Method: Stylet Placement Confirmation: ETT inserted through vocal cords under direct vision, positive ETCO2 and breath sounds checked- equal and bilateral Secured at: 23 cm Tube secured with: Tape Dental Injury: Teeth and Oropharynx as per pre-operative assessment

## 2023-08-17 NOTE — Discharge Instructions (Addendum)
Open Hernia Repair, Adult, Care After What can I expect after the procedure? After the procedure, it is common to have: Mild discomfort. Slight bruising. Mild swelling. Pain in the belly (abdomen). A small amount of blood from the cut from surgery (incision). Follow these instructions at home: Your doctor may give you more specific instructions. If you have problems, call your doctor. Medicines Take over-the-counter and prescription medicines only as told by your doctor. If told, take steps to prevent problems with pooping (constipation). You may need to: Drink enough fluid to keep your pee (urine) pale yellow. Take medicines. You will be told what medicines to take. Eat foods that are high in fiber. These include beans, whole grains, and fresh fruits and vegetables. Limit foods that are high in fat and sugar. These include fried or sweet foods. Ask your doctor if you should avoid driving or using machines while you are taking your medicine. Incision care  Follow instructions from your doctor about how to take care of your incision. Make sure you: Wash your hands with soap and water for at least 20 seconds before and after you change your bandage (dressing). If you cannot use soap and water, use hand sanitizer. Change your bandage. Leave stitches or skin glue in place for at least 2 weeks. Leave tape strips alone unless you are told to take them off. You may trim the edges of the tape strips if they curl up. Check your incision every day for signs of infection. Check for: More redness, swelling, or pain. More fluid or blood. Warmth. Pus or a bad smell. Wear loose, soft clothing while your incision heals. Activity  Rest as told by your doctor. Do not lift anything that is heavier than 10 lb (4.5 kg), or the limit that you are told. Do not play contact sports until your doctor says that this is safe. If you were given a sedative during your procedure, do not drive or use machines  until your doctor says that it is safe. A sedative is a medicine that helps you relax. Return to your normal activities when your doctor says that it is safe. General instructions Do not take baths, swim, or use a hot tub. Ask your doctor about taking showers or sponge baths. Hold a pillow over your belly when you cough or sneeze. This helps with pain. Do not smoke or use any products that contain nicotine or tobacco. If you need help quitting, ask your doctor. Keep all follow-up visits. Contact a doctor if: You have any of these signs of infection in or around your incision: More redness, swelling, or pain. More fluid or blood. Warmth. Pus. A bad smell. You have a fever or chills. You have blood in your poop (stool). You have not pooped (had a bowel movement) in 2-3 days. Medicine does not help your pain. Get help right away if: You have chest pain, or you are short of breath. You feel faint or light-headed. You have very bad pain. You vomit and your pain is worse. You have pain, swelling, or redness in a leg. These symptoms may be an emergency. Get help right away. Call your local emergency services (911 in the [place] Do not wait to see if the symptoms will go away. Do not drive yourself to the hospital. Summary After this procedure, it is common to have mild discomfort, slight bruising, and mild swelling. Follow instructions from your doctor about how to take care of your cut from surgery (incision). Check every  day for signs of infection. Do not lift heavy objects or play contact sports until your doctor says it is safe. Return to your normal activities as told by your doctor. This information is not intended to replace advice given to you by your health care provider. Make sure you discuss any questions you have with your health care provider. Document Revised: 06/03/2020 Document Reviewed: 06/03/2020 Elsevier Patient Education  2024 Elsevier Inc.   AMBULATORY SURGERY   DISCHARGE INSTRUCTIONS   The drugs that you were given will stay in your system until tomorrow so for the next 24 hours you should not:  Drive an automobile Make any legal decisions Drink any alcoholic beverage   You may resume regular meals tomorrow.  Today it is better to start with liquids and gradually work up to solid foods.  You may eat anything you prefer, but it is better to start with liquids, then soup and crackers, and gradually work up to solid foods.   Please notify your doctor immediately if you have any unusual bleeding, trouble breathing, redness and pain at the surgery site, drainage, fever, or pain not relieved by medication.    Information for Discharge Teaching:  DO NOT REMOVE TEAL EXPAPEL Bracelet for 4 Days (96 hours). 08/21/2023 EXPAREL (bupivacaine liposome injectable suspension)   Pain relief is important to your recovery. The goal is to control your pain so you can move easier and return to your normal activities as soon as possible after your procedure. Your physician may use several types of medicines to manage pain, swelling, and more.  Your surgeon or anesthesiologist gave you EXPAREL(bupivacaine) to help control your pain after surgery.  EXPAREL is a local anesthetic designed to release slowly over an extended period of time to provide pain relief by numbing the tissue around the surgical site. EXPAREL is designed to release pain medication over time and can control pain for up to 72 hours. Depending on how you respond to EXPAREL, you may require less pain medication during your recovery. EXPAREL can help reduce or eliminate the need for opioids during the first few days after surgery when pain relief is needed the most. EXPAREL is not an opioid and is not addictive. It does not cause sleepiness or sedation.   Important! A teal colored band has been placed on your arm with the date, time and amount of EXPAREL you have received. Please leave this  armband in place for the full 96 hours following administration, and then you may remove the band. If you return to the hospital for any reason within 96 hours following the administration of EXPAREL, the armband provides important information that your health care providers to know, and alerts them that you have received this anesthetic.    Possible side effects of EXPAREL: Temporary loss of sensation or ability to move in the area where medication was injected. Nausea, vomiting, constipation Rarely, numbness and tingling in your mouth or lips, lightheadedness, or anxiety may occur. Call your doctor right away if you think you may be experiencing any of these sensations, or if you have other questions regarding possible side effects.  Follow all other discharge instructions given to you by your surgeon or nurse. Eat a healthy diet and drink plenty of water or other fluids.        Please contact your physician with any problems or Same Day Surgery at [phone], Monday through Friday 6 am to 4 pm, or [place] at [facility] number at [phone].

## 2023-08-18 ENCOUNTER — Encounter: Payer: Self-pay | Admitting: Surgery

## 2023-08-18 LAB — SURGICAL PATHOLOGY

## 2023-08-18 NOTE — Anesthesia Postprocedure Evaluation (Signed)
Anesthesia Post Note  Patient: Luke [name]  Procedure(s) Performed: HERNIA REPAIR VENTRAL ADULT, open (Abdomen)  Patient location during evaluation: PACU Anesthesia Type: General Level of consciousness: awake and alert, oriented and patient cooperative Pain management: pain level controlled Vital Signs Assessment: post-procedure vital signs reviewed and stable Respiratory status: spontaneous breathing, nonlabored ventilation and respiratory function stable Cardiovascular status: blood pressure returned to baseline and stable Postop Assessment: adequate PO intake Anesthetic complications: no   No notable events documented.   Last Vitals:  Vitals:   08/17/23 1315 08/17/23 1325  BP: (!) 126/94 (!) 140/105  Pulse: 63 91  Resp: (!) 22 20  Temp:    SpO2: 94% 97%    Last Pain:  Vitals:   08/17/23 1325  TempSrc: Temporal  PainSc: 0-No pain                 Reed Breech

## 2023-08-20 ENCOUNTER — Ambulatory Visit: Payer: 59

## 2023-08-20 DIAGNOSIS — G4733 Obstructive sleep apnea (adult) (pediatric): Secondary | ICD-10-CM

## 2023-08-20 DIAGNOSIS — R0683 Snoring: Secondary | ICD-10-CM

## 2023-08-20 DIAGNOSIS — Z9189 Other specified personal risk factors, not elsewhere classified: Secondary | ICD-10-CM

## 2023-08-25 ENCOUNTER — Encounter: Payer: Medicaid Other | Admitting: Surgery

## 2023-08-27 ENCOUNTER — Other Ambulatory Visit: Payer: Self-pay

## 2023-08-27 ENCOUNTER — Other Ambulatory Visit: Payer: Self-pay | Admitting: Nurse Practitioner

## 2023-08-27 DIAGNOSIS — E1140 Type 2 diabetes mellitus with diabetic neuropathy, unspecified: Secondary | ICD-10-CM

## 2023-08-30 ENCOUNTER — Other Ambulatory Visit: Payer: Self-pay

## 2023-08-30 MED FILL — Insulin Lispro Soln Pen-injector 100 Unit/ML (1 Unit Dial): SUBCUTANEOUS | 30 days supply | Qty: 9 | Fill #0 | Status: AC

## 2023-08-30 NOTE — Telephone Encounter (Signed)
Patient is out of his insulin lispro (HUMALOG KWIKPEN) 100 UNIT/ML KwikPen ,since Thursday, 11/25/2022. Pharmacy is [facility], and he wants a 90 day supply per his insurance.

## 2023-08-31 ENCOUNTER — Telehealth: Payer: Self-pay

## 2023-08-31 NOTE — Telephone Encounter (Signed)
Transition Care Management Unsuccessful Follow-up Telephone Call  Date of discharge and from where:  [place] 9/24  Attempts:  1st Attempt  Reason for unsuccessful TCM follow-up call:  No answer/busy   Luke [name]  [facility], [facility] Guide, Phone: [phone] Website: [email or web address]

## 2023-09-01 ENCOUNTER — Encounter: Payer: Medicaid Other | Admitting: Physician Assistant

## 2023-09-01 ENCOUNTER — Telehealth: Payer: Self-pay

## 2023-09-01 NOTE — Telephone Encounter (Signed)
Transition Care Management Unsuccessful Follow-up Telephone Call  Date of discharge and from where:  [place] 9/24  Attempts:  2nd Attempt  Reason for unsuccessful TCM follow-up call:  No answer/busy   Lenard Forth [place]  [facility], [facility] Guide, Phone: [phone] Website: [email or web address]

## 2023-09-14 DIAGNOSIS — G4733 Obstructive sleep apnea (adult) (pediatric): Secondary | ICD-10-CM | POA: Diagnosis not present

## 2023-09-15 ENCOUNTER — Telehealth: Payer: Self-pay | Admitting: Primary Care

## 2023-09-15 DIAGNOSIS — G4730 Sleep apnea, unspecified: Secondary | ICD-10-CM

## 2023-09-15 NOTE — Telephone Encounter (Signed)
HST on 08/20/23 showed severe sleep apnea with oxygen desaturations  Needs CPAP titration study to assess correct pressure settings and possible oxygen need. I will order. Please schedule follow-up with me in 6-8 weeks

## 2023-09-16 NOTE — Telephone Encounter (Signed)
Called patient.  Gave all information.  Made OV for 11/16/2023.  Patient verbalized understanding.   Patient will wait on call regarding order placed by [facility] for CPAP titration study.

## 2023-09-20 ENCOUNTER — Other Ambulatory Visit: Payer: 59

## 2023-09-23 ENCOUNTER — Ambulatory Visit: Payer: 59 | Admitting: Nurse Practitioner

## 2023-09-24 ENCOUNTER — Other Ambulatory Visit: Payer: Self-pay

## 2023-09-24 MED FILL — Insulin Lispro Soln Pen-injector 100 Unit/ML (1 Unit Dial): SUBCUTANEOUS | 30 days supply | Qty: 9 | Fill #1 | Status: AC

## 2023-09-27 ENCOUNTER — Other Ambulatory Visit: Payer: Self-pay | Admitting: Nurse Practitioner

## 2023-09-27 ENCOUNTER — Other Ambulatory Visit: Payer: Self-pay

## 2023-09-27 DIAGNOSIS — E1169 Type 2 diabetes mellitus with other specified complication: Secondary | ICD-10-CM

## 2023-09-27 DIAGNOSIS — E1140 Type 2 diabetes mellitus with diabetic neuropathy, unspecified: Secondary | ICD-10-CM

## 2023-09-27 MED FILL — Pravastatin Sodium Tab 80 MG: ORAL | 30 days supply | Qty: 30 | Fill #0 | Status: AC

## 2023-09-27 MED FILL — Gabapentin Cap 300 MG: ORAL | 30 days supply | Qty: 30 | Fill #0 | Status: AC

## 2023-09-28 ENCOUNTER — Telehealth: Payer: Self-pay | Admitting: Primary Care

## 2023-09-28 ENCOUNTER — Telehealth: Payer: Self-pay | Admitting: Nurse Practitioner

## 2023-09-28 NOTE — Telephone Encounter (Signed)
[facility] needs more medical documentation for patient to receive sleep study.

## 2023-09-28 NOTE — Telephone Encounter (Signed)
Patient need lab orders.

## 2023-09-29 ENCOUNTER — Other Ambulatory Visit: Payer: Self-pay

## 2023-09-29 ENCOUNTER — Other Ambulatory Visit: Payer: Self-pay | Admitting: Nurse Practitioner

## 2023-09-29 DIAGNOSIS — E1140 Type 2 diabetes mellitus with diabetic neuropathy, unspecified: Secondary | ICD-10-CM

## 2023-09-29 DIAGNOSIS — I10 Essential (primary) hypertension: Secondary | ICD-10-CM

## 2023-09-29 NOTE — Telephone Encounter (Signed)
Labs ordered.

## 2023-10-01 ENCOUNTER — Other Ambulatory Visit: Payer: Self-pay

## 2023-10-05 ENCOUNTER — Other Ambulatory Visit (INDEPENDENT_AMBULATORY_CARE_PROVIDER_SITE_OTHER): Payer: 59

## 2023-10-05 DIAGNOSIS — E1140 Type 2 diabetes mellitus with diabetic neuropathy, unspecified: Secondary | ICD-10-CM | POA: Diagnosis not present

## 2023-10-05 DIAGNOSIS — I10 Essential (primary) hypertension: Secondary | ICD-10-CM | POA: Diagnosis not present

## 2023-10-05 LAB — LIPID PANEL
Cholesterol: 171 mg/dL (ref 0–200)
HDL: 32.3 mg/dL — ABNORMAL LOW (ref >39.00)
Total CHOL/HDL Ratio: 5
Triglycerides: 427 mg/dL — ABNORMAL HIGH (ref 0.0–149.0)

## 2023-10-05 LAB — COMPREHENSIVE METABOLIC PANEL
ALT: 19 U/L (ref 0–53)
AST: 19 U/L (ref 0–37)
Albumin: 4.3 g/dL (ref 3.5–5.2)
Alkaline Phosphatase: 94 U/L (ref 39–117)
BUN: 7 mg/dL (ref 6–23)
CO2: 29 meq/L (ref 19–32)
Calcium: 9.7 mg/dL (ref 8.4–10.5)
Chloride: 102 meq/L (ref 96–112)
Creatinine, Ser: 1.21 mg/dL (ref 0.40–1.50)
GFR: 73.2 mL/min (ref >60.00)
Glucose, Bld: 204 mg/dL — ABNORMAL HIGH (ref 70–99)
Potassium: 3.7 meq/L (ref 3.5–5.1)
Sodium: 140 meq/L (ref 135–145)
Total Bilirubin: 0.7 mg/dL (ref 0.2–1.2)
Total Protein: 7.4 g/dL (ref 6.0–8.3)

## 2023-10-05 LAB — CBC WITH DIFFERENTIAL/PLATELET
Basophils Absolute: 0 10*3/uL (ref 0.0–0.1)
Basophils Relative: 0.6 % (ref 0.0–3.0)
Eosinophils Absolute: 0.1 10*3/uL (ref 0.0–0.7)
Eosinophils Relative: 1.1 % (ref 0.0–5.0)
HCT: 50.3 % (ref 39.0–52.0)
Hemoglobin: 16 g/dL (ref 13.0–17.0)
Lymphocytes Relative: 45.7 % (ref 12.0–46.0)
Lymphs Abs: 2.8 10*3/uL (ref 0.7–4.0)
MCHC: 31.8 g/dL (ref 30.0–36.0)
MCV: 87.4 fL (ref 78.0–100.0)
Monocytes Absolute: 0.3 10*3/uL (ref 0.1–1.0)
Monocytes Relative: 5.6 % (ref 3.0–12.0)
Neutro Abs: 2.9 10*3/uL (ref 1.4–7.7)
Neutrophils Relative %: 47 % (ref 43.0–77.0)
Platelets: 191 10*3/uL (ref 150.0–400.0)
RBC: 5.76 Mil/uL (ref 4.22–5.81)
RDW: 13.4 % (ref 11.5–15.5)
WBC: 6.2 10*3/uL (ref 4.0–10.5)

## 2023-10-05 LAB — LDL CHOLESTEROL, DIRECT: Direct LDL: 84 mg/dL

## 2023-10-05 LAB — HEMOGLOBIN A1C: Hgb A1c MFr Bld: 12 % — ABNORMAL HIGH (ref 4.6–6.5)

## 2023-10-05 NOTE — Telephone Encounter (Signed)
Amy from [facility] needs additional clinical notes for sleep study. Amy phone number is [phone].

## 2023-10-07 ENCOUNTER — Other Ambulatory Visit: Payer: Self-pay

## 2023-10-07 ENCOUNTER — Encounter: Payer: Self-pay | Admitting: Nurse Practitioner

## 2023-10-07 ENCOUNTER — Ambulatory Visit (INDEPENDENT_AMBULATORY_CARE_PROVIDER_SITE_OTHER): Payer: 59 | Admitting: Nurse Practitioner

## 2023-10-07 VITALS — BP 136/84 | HR 103 | Temp 98.1°F | Ht 68.0 in | Wt 199.0 lb

## 2023-10-07 DIAGNOSIS — E781 Pure hyperglyceridemia: Secondary | ICD-10-CM | POA: Diagnosis not present

## 2023-10-07 DIAGNOSIS — I10 Essential (primary) hypertension: Secondary | ICD-10-CM | POA: Diagnosis not present

## 2023-10-07 DIAGNOSIS — E1140 Type 2 diabetes mellitus with diabetic neuropathy, unspecified: Secondary | ICD-10-CM | POA: Diagnosis not present

## 2023-10-07 MED ORDER — FENOFIBRATE 54 MG PO TABS
54.0000 mg | ORAL_TABLET | Freq: Every day | ORAL | 1 refills | Status: AC
  Filled 2023-10-07: qty 30, 30d supply, fill #0

## 2023-10-07 MED ORDER — FREESTYLE LIBRE 3 SENSOR MISC
5 refills | Status: DC
  Filled 2023-10-07 – 2023-10-28 (×2): qty 2, 28d supply, fill #0

## 2023-10-07 NOTE — Progress Notes (Signed)
Established Patient Office Visit  Subjective:  Patient ID: Luke [name], male    DOB: [birth year]  Age: 42 y.o. MRN: [id number]  CC:  Chief Complaint  Patient presents with   Medical Management of Chronic Issues    HPI  Luke [name] presents for chronic disease management.   He has incarcerated hernia repair on 08/17/2023 by Dr. Cecelia Byars.  Patient did not made a follow-up appointment with Dr. Cecelia Byars since surgery. Transition care team tried to reach out twice on 10/29 and 10/30.  Pt states he is doing better.  Patient had home sleep study on 10/18, he is scheduled  for CPAP titration on 10/19/2011.  Patient states that he checks his blood pressure intermittently not consistently.  Lab results discussed with the patient                           HPI   Past Medical History:  Diagnosis Date   Anemia    B12 deficiency    Cecum mass 08/15/2018   a.) 4.8 cm pedunculated cecal mass; Bx (+) for villous adeoma. b.) s/p RIGHT colectomy.   Chronic kidney disease    Colonic neoplasm    Columnar-lined esophagus    Diverticulosis of large intestine without diverticulitis    Dyslipidemia (high LDL; low HDL)    Gastric polyp    GERD (gastroesophageal reflux disease)    Hyperlipidemia    Hyperlipidemia associated with type 2 diabetes mellitus (HCC)    Hypertension    Peripheral polyneuropathy    Pneumonia 09/07/2016   Sleep apnea    Stomach irritation    Type 2 diabetes mellitus treated with insulin (HCC)    Unilateral inguinal hernia, without obstruction or gangrene, not specified as recurrent     Past Surgical History:  Procedure Laterality Date   COLON SURGERY     COLONOSCOPY WITH PROPOFOL N/A 07/28/2018   Procedure: COLONOSCOPY WITH PROPOFOL;  Surgeon: Pasty Spillers, MD;  Location: [facility] END[facility];  Service: Endoscopy;  Laterality: N/A;   COLONOSCOPY WITH PROPOFOL N/A 10/18/2019   Procedure: COLONOSCOPY WITH PROPOFOL;  Surgeon: Pasty Spillers, MD;  Location:  [facility];  Service: Endoscopy;  Laterality: N/A;   COLONOSCOPY WITH PROPOFOL N/A 02/17/2021   Procedure: COLONOSCOPY WITH PROPOFOL;  Surgeon: Pasty Spillers, MD;  Location: [facility] ENDOSCOPY;  Service: Endoscopy;  Laterality: N/A;   ESOPHAGOGASTRODUODENOSCOPY (EGD) WITH PROPOFOL N/A 07/28/2018   Procedure: ESOPHAGOGASTRODUODENOSCOPY (EGD) WITH PROPOFOL;  Surgeon: Pasty Spillers, MD;  Location: [facility] ENDOSCOPY;  Service: Endoscopy;  Laterality: N/A;   PARTIAL COLECTOMY Right 08/15/2018   Procedure: RIGHT  COLECTOMY;  Surgeon: Ancil Linsey, MD;  Location: [facility] OR[facility];  Service: General;  Laterality: Right;   VENTRAL HERNIA REPAIR N/A 08/17/2023   Procedure: HERNIA REPAIR VENTRAL ADULT, open;  Surgeon: Leafy Ro, MD;  Location: [facility] ORS;  Service: General;  Laterality: N/A;   XI ROBOTIC ASSISTED INGUINAL HERNIA REPAIR WITH MESH Right 01/07/2023   Procedure: XI ROBOTIC ASSISTED INGUINAL HERNIA REPAIR WITH MESH, poss Bilateral;  Surgeon: Leafy Ro, MD;  Location: [facility] ORS;  Service: General;  Laterality: Right;  Possible Bilateral    Family History  Problem Relation Age of Onset   Heart disease Mother    Diabetes Mother    Cancer Mother    Breast cancer Mother    Hypertension Mother    Arthritis Father    Hypertension Father    Colon cancer Paternal Uncle  CAD Maternal Grandmother    Breast cancer Maternal Grandmother    Throat cancer Maternal Grandfather    Diabetes Paternal Grandmother     Social History   Socioeconomic History   Marital status: Single    Spouse name: Not on file   Number of children: Not on file   Years of education: 12   Highest education level: Not on file  Occupational History    Comment: [employer]  Tobacco Use   Smoking status: Former    Current packs/day: 0.00    Types: Cigarettes    Quit date: 03/25/2020    Years since quitting: 3.5    Passive exposure: Past   Smokeless tobacco: Never   Tobacco comments:    patient is  social smoker/ smokes when he drinks   Vaping Use   Vaping status: Never Used  Substance and Sexual Activity   Alcohol use: Yes    Alcohol/week: 2.0 standard drinks of alcohol    Types: 2 Shots of liquor per week   Drug use: Not Currently    Types: Marijuana    Comment: last use 2020   Sexual activity: Not Currently    Birth control/protection: Condom  Other Topics Concern   Not on file  Social History Narrative   Not on file   Social Drivers of Health   Financial Resource Strain: Not on file  Food Insecurity: No Food Insecurity (04/15/2022)   Hunger Vital Sign    Worried About Running Out of Food in the Last Year: Never true    Ran Out of Food in the Last Year: Never true  Transportation Needs: No Transportation Needs (04/15/2022)   PRAPARE - Administrator, Civil Service (Medical): No    Lack of Transportation (Non-Medical): No  Physical Activity: Not on file  Stress: Not on file  Social Connections: Not on file  Intimate Partner Violence: Not on file     Outpatient Medications Prior to Visit  Medication Sig Dispense Refill   amLODipine (NORVASC) 5 MG tablet Take 1 tablet (5 mg total) by mouth daily. 90 tablet 3   blood glucose meter kit and supplies KIT Dispense based on patient and insurance preference. Use up to four times daily as directed. (FOR ICD-9 250.00, 250.01). 1 each 0   gabapentin (NEURONTIN) 300 MG capsule Take 1 capsule (300 mg total) by mouth at bedtime. 30 capsule 2   HYDROcodone-acetaminophen (NORCO/VICODIN) 5-325 MG tablet Take 1-2 tablets by mouth every 4 (four) hours as needed for moderate pain (pain score 4-6). 20 tablet 0   HYDROcodone-acetaminophen (NORCO/VICODIN) 5-325 MG tablet Take 1-2 tablets by mouth every 4 (four) hours as needed for moderate pain (pain score 4-6) 20 tablet 0   insulin lispro (HUMALOG) 100 UNIT/ML KwikPen Inject 10 Units into the skin 3 (three) times daily. 9 mL 2   Insulin Pen Needle (NOVOFINE PEN NEEDLE) 32G X 6 MM  MISC use as directed 100 each 98   Insulin Pen Needle 32G X 4 MM MISC USE AS DIRECTED WITH INSULIN. 100 each PRN   pravastatin (PRAVACHOL) 80 MG tablet Take 1 tablet (80 mg total) by mouth daily. 30 tablet 2   Insulin Glargine (BASAGLAR KWIKPEN) 100 UNIT/ML Inject 34 Units into the skin at bedtime. 15 mL 2   No facility-administered medications prior to visit.    No Known Allergies  ROS Review of Systems Negative unless indicated in HPI.    Objective:    Physical Exam Constitutional:  Appearance: Normal appearance. He is normal weight.  HENT:     Head: Normocephalic.     Right Ear: Tympanic membrane normal.     Left Ear: Tympanic membrane normal.     Mouth/Throat:     Mouth: Mucous membranes are moist.  Eyes:     Extraocular Movements: Extraocular movements intact.     Conjunctiva/sclera: Conjunctivae normal.     Pupils: Pupils are equal, round, and reactive to light.  Neck:     Thyroid: No thyroid mass or thyroid tenderness.  Cardiovascular:     Rate and Rhythm: Normal rate and regular rhythm.     Pulses: Normal pulses.     Heart sounds: Normal heart sounds. No murmur heard. Pulmonary:     Effort: Pulmonary effort is normal.     Breath sounds: Normal breath sounds.  Abdominal:     General: Bowel sounds are normal.     Palpations: Abdomen is soft. There is no mass.     Tenderness: There is abdominal tenderness. There is no rebound.  Musculoskeletal:        General: No swelling.     Cervical back: Neck supple. No tenderness.     Right lower leg: No edema.     Left lower leg: No edema.  Skin:    Findings: No bruising, erythema or rash.  Neurological:     General: No focal deficit present.     Mental Status: He is alert and oriented to person, place, and time. Mental status is at baseline.  Psychiatric:        Mood and Affect: Mood normal.        Behavior: Behavior normal.        Thought Content: Thought content normal.        Judgment: Judgment normal.      BP 136/84   Pulse (!) 103   Temp 98.1 F (36.7 C)   Ht 5\' 8"  (1.727 m)   Wt 199 lb (90.3 kg)   SpO2 98%   BMI 30.26 kg/m  Wt Readings from Last 3 Encounters:  10/07/23 199 lb (90.3 kg)  08/17/23 200 lb (90.7 kg)  08/09/23 200 lb (90.7 kg)     Health Maintenance  Topic Date Due   Hepatitis C Screening  Never done   DTaP/Tdap/Td (1 - Tdap) Never done   OPHTHALMOLOGY EXAM  01/31/2022   Colonoscopy  02/17/2022   FOOT EXAM  01/15/2023   COVID-19 Vaccine (1) 10/23/2023 (Originally 12/27/1985)   INFLUENZA VACCINE  01/31/2024 (Originally 06/03/2023)   Diabetic kidney evaluation - Urine ACR  03/25/2024   HEMOGLOBIN A1C  04/04/2024   Diabetic kidney evaluation - eGFR measurement  10/04/2024   HIV Screening  Completed   HPV VACCINES  Aged Out    There are no preventive care reminders to display for this patient.  Lab Results  Component Value Date   TSH 0.891 02/28/2020   Lab Results  Component Value Date   WBC 6.2 10/05/2023   HGB 16.0 10/05/2023   HCT 50.3 10/05/2023   MCV 87.4 10/05/2023   PLT 191.0 10/05/2023   Lab Results  Component Value Date   NA 140 10/05/2023   K 3.7 10/05/2023   CO2 29 10/05/2023   GLUCOSE 204 (H) 10/05/2023   BUN 7 10/05/2023   CREATININE 1.21 10/05/2023   BILITOT 0.7 10/05/2023   ALKPHOS 94 10/05/2023   AST 19 10/05/2023   ALT 19 10/05/2023   PROT 7.4 10/05/2023   ALBUMIN 4.3  10/05/2023   CALCIUM 9.7 10/05/2023   ANIONGAP 12 07/27/2023   EGFR 88 03/19/2021   GFR 73.20 10/05/2023   Lab Results  Component Value Date   CHOL 171 10/05/2023   Lab Results  Component Value Date   HDL 32.30 (L) 10/05/2023   Lab Results  Component Value Date   LDLCALC 71 09/10/2021   Lab Results  Component Value Date   TRIG (H) 10/05/2023    427.0 Triglyceride is over 400; calculations on Lipids are invalid.   Lab Results  Component Value Date   CHOLHDL 5 10/05/2023   Lab Results  Component Value Date   HGBA1C 12.0 (H) 10/05/2023       Assessment & Plan:  Type 2 diabetes mellitus with diabetic neuropathy, without long-term current use of insulin (HCC) Assessment & Plan: Lab Results  Component Value Date   HGBA1C 12.0 (H) 10/05/2023  Poor glycemic control with elevated triglycerides level. Discussed lifestyle intervention, diet and exercise in detail. Initiate continuous glucose monitor. Continue Humalog and glargine. Will continue to monitor.      Orders: -     FreeStyle Libre 3 Sensor; Place 1 sensor on the skin every 14 days. Use to check glucose continuously  Dispense: 2 each; Refill: 5  Hypertriglyceridemia Assessment & Plan: Lab Results  Component Value Date   CHOL 171 10/05/2023   HDL 32.30 (L) 10/05/2023   LDLCALC 71 09/10/2021   LDLDIRECT 84.0 10/05/2023   TRIG (H) 10/05/2023    427.0 Triglyceride is over 400; calculations on Lipids are invalid.   CHOLHDL 5 10/05/2023  Discussed diet and exercise. Will start on fenofibrate. Continue pravastatin. Follow lipid panel   Orders: -     Fenofibrate; Take 1 tablet (54 mg total) by mouth daily.  Dispense: 90 tablet; Refill: 1  Primary hypertension Assessment & Plan: Patient BP  Vitals:   10/07/23 1128  BP: 136/84    in the office. Advised pt to follow a low sodium and heart healthy diet. Continue amlodipine 5 mg daily      Follow-up: Return in about 1 month (around 11/07/2023) for diabetes.   Kara Dies, NP

## 2023-10-08 ENCOUNTER — Ambulatory Visit: Payer: 59

## 2023-10-08 ENCOUNTER — Other Ambulatory Visit: Payer: Self-pay

## 2023-10-08 ENCOUNTER — Telehealth: Payer: Self-pay

## 2023-10-08 ENCOUNTER — Encounter: Payer: Self-pay | Admitting: Hematology and Oncology

## 2023-10-08 NOTE — Telephone Encounter (Signed)
Patient states he is at the phone store and needs the app for sugar monitor.  Patient states the person who helped him today would be able to tell him the information.  Patient saw Jenate [place]az[place], CMA.  I spoke with Loletta Specter and she states it's the [employer] Plus.  I relayed the information to patient.

## 2023-10-08 NOTE — Progress Notes (Signed)
Pt presented in office for CBG teaching for the [place] 3 plus. I instructed pt how to place CBG sensor . We placed it on his right arm pt tolerated well and I informed him that he has to change the sensor every 15 days and proceeded to download the [place] 3 plus app but pt was unable to remember his Apple Id so I was unable to download app pt stated that he would have his daughter to download when he gets home and let us know when he has it downloaded so we can connect it to our office.

## 2023-10-08 NOTE — Telephone Encounter (Signed)
Spoke to [employer] and told her to relay to pt the [employer] 3 plus

## 2023-10-11 ENCOUNTER — Telehealth: Payer: Self-pay

## 2023-10-11 ENCOUNTER — Other Ambulatory Visit: Payer: Self-pay

## 2023-10-11 ENCOUNTER — Encounter: Payer: Self-pay | Admitting: Hematology and Oncology

## 2023-10-11 ENCOUNTER — Ambulatory Visit: Payer: 59

## 2023-10-11 NOTE — Telephone Encounter (Signed)
We received a note from Amy with [facility] stating the patient's insuranced denied in lab sleep study which should be cpap titration. The note stated they recommending home sleep study which was just done on 08/20/23. I have called [facility] and had to leave a message for Amy about this issue

## 2023-10-11 NOTE — Telephone Encounter (Signed)
Pt called back and stated someone did help him get everything set up

## 2023-10-11 NOTE — Telephone Encounter (Signed)
Attempted to call Luke [name] to check to see if he was able to download the [place] app.  No answer and no voicemail set up.  Will send a Mychart message.    Kara Dies, NP  [facility] Please call the patient to check if he was able to download the [place] app and connected to our office system.        Luke [name] presented in office for CBG teaching for the [place] 3 plus. I instructed Luke [name] how to place CBG sensor . We placed it on his right arm Luke [name] tolerated well and I informed him that he has to change the sensor every 15 days and proceeded to download the [place] 3 plus app but Luke [name] was unable to remember his Apple Id so I was unable to download app Luke [name] stated that he would have his daughter to download when he gets home and let us know when he has it downloaded so we can connect it to our office.

## 2023-10-11 NOTE — Telephone Encounter (Signed)
Tried to contact pt as pt was marked arrived on nurse visit but not in lobby due to pt arriving at 12:36 and [place] schedule lunch was at 12:30-1:30 RN supervisor Luke [name] stated she spoke with pt and he will arrive in 10 minutes @ 11:41. Pt was then placed on the schedule.    Was unable to leave a vm to inform pt he can come back for the set up as vm box is not set up.

## 2023-10-11 NOTE — Telephone Encounter (Signed)
Pt came into office this morning and I was able to connect his [name] app to our office.

## 2023-10-13 ENCOUNTER — Encounter: Payer: Self-pay | Admitting: Nurse Practitioner

## 2023-10-13 NOTE — Telephone Encounter (Signed)
error 

## 2023-10-13 NOTE — Telephone Encounter (Signed)
Noted  

## 2023-10-15 ENCOUNTER — Other Ambulatory Visit (HOSPITAL_COMMUNITY): Payer: Self-pay

## 2023-10-15 ENCOUNTER — Telehealth: Payer: Self-pay

## 2023-10-15 NOTE — Telephone Encounter (Signed)
Pharmacy Patient Advocate Encounter   Received notification from CoverMyMeds that prior authorization for FreeStyle Libre 3 Sensor is required/requested.   Insurance verification completed.   The patient is insured through [facility] .   Per test claim: PA required; PA submitted to above mentioned insurance via CoverMyMeds Key/confirmation #/EOC [id number] Status is pending

## 2023-10-18 ENCOUNTER — Other Ambulatory Visit (HOSPITAL_COMMUNITY): Payer: Self-pay

## 2023-10-18 NOTE — Telephone Encounter (Signed)
Pharmacy Patient Advocate Encounter   Received notification from CoverMyMeds that prior authorization for FreeStyle Libre 3 Sensor  is required/requested.   Insurance verification completed.   The patient is insured through [facility] .   Per test claim: PA required; PA submitted to above mentioned insurance via CoverMyMeds Key/confirmation #/EOC [id number] Status is pending

## 2023-10-18 NOTE — Telephone Encounter (Signed)
Pharmacy Patient Advocate Encounter  Received notification from [facility] that Prior Authorization for  FreeStyle Libre 3 Sensor  has been CANCELLED due to: member has alternative pharmacy benefits. [employer] [facility] is the payer of last resort.    PA #/Case ID/Reference #: [id number]

## 2023-10-19 ENCOUNTER — Other Ambulatory Visit: Payer: Self-pay | Admitting: Nurse Practitioner

## 2023-10-19 ENCOUNTER — Other Ambulatory Visit: Payer: Self-pay

## 2023-10-19 ENCOUNTER — Ambulatory Visit: Payer: 59 | Attending: Otolaryngology

## 2023-10-19 ENCOUNTER — Other Ambulatory Visit (HOSPITAL_COMMUNITY): Payer: Self-pay

## 2023-10-19 DIAGNOSIS — E119 Type 2 diabetes mellitus without complications: Secondary | ICD-10-CM | POA: Diagnosis not present

## 2023-10-19 DIAGNOSIS — G4733 Obstructive sleep apnea (adult) (pediatric): Secondary | ICD-10-CM | POA: Diagnosis present

## 2023-10-19 DIAGNOSIS — G4723 Circadian rhythm sleep disorder, irregular sleep wake type: Secondary | ICD-10-CM | POA: Diagnosis not present

## 2023-10-19 DIAGNOSIS — I10 Essential (primary) hypertension: Secondary | ICD-10-CM | POA: Insufficient documentation

## 2023-10-19 DIAGNOSIS — E1140 Type 2 diabetes mellitus with diabetic neuropathy, unspecified: Secondary | ICD-10-CM

## 2023-10-19 MED ORDER — DEXCOM G6 SENSOR MISC
3 refills | Status: DC
  Filled 2023-10-19: qty 1, 10d supply, fill #0
  Filled 2023-10-20: qty 3, 30d supply, fill #0
  Filled 2023-11-04 – 2023-11-18 (×2): qty 1, 14d supply, fill #0
  Filled 2023-11-18: qty 1, 10d supply, fill #0
  Filled 2023-11-18: qty 1, 14d supply, fill #0

## 2023-10-19 NOTE — Telephone Encounter (Signed)
Pharmacy Patient Advocate Encounter  Received notification from [facility] that Prior Authorization for FreeStyle Libre 3 Sensor  has been DENIED.  See denial reason below. No denial letter attached in CMM. Will attach denial letter to Media tab once received.   PA #/Case ID/Reference #: [id number]

## 2023-10-19 NOTE — Progress Notes (Signed)
Freestyle Libre 3 sensor not covered by the insurance sent Dexcom G6 sensor.

## 2023-10-19 NOTE — Telephone Encounter (Signed)
Thank you. Dexcom G6 sensor sent to the pharmacy.

## 2023-10-19 NOTE — Telephone Encounter (Signed)
I faxed the previous sleep study to [employer]. They must have approved because the patient has been rescheduled to do Cpap Titration on 10/19/23

## 2023-10-20 ENCOUNTER — Other Ambulatory Visit: Payer: Self-pay

## 2023-10-20 NOTE — Telephone Encounter (Signed)
noted 

## 2023-10-21 ENCOUNTER — Other Ambulatory Visit: Payer: Self-pay

## 2023-10-21 ENCOUNTER — Telehealth: Payer: Self-pay | Admitting: Nurse Practitioner

## 2023-10-21 NOTE — Assessment & Plan Note (Addendum)
Lab Results  Component Value Date   HGBA1C 12.0 (H) 10/05/2023  Poor glycemic control with elevated triglycerides level. Discussed lifestyle intervention, diet and exercise in detail. Initiate continuous glucose monitor. Continue Humalog and glargine. Will continue to monitor.

## 2023-10-21 NOTE — Assessment & Plan Note (Signed)
Patient BP  Vitals:   10/07/23 1128  BP: 136/84    in the office. Advised pt to follow a low sodium and heart healthy diet. Continue amlodipine 5 mg daily

## 2023-10-21 NOTE — Assessment & Plan Note (Addendum)
Lab Results  Component Value Date   CHOL 171 10/05/2023   HDL 32.30 (L) 10/05/2023   LDLCALC 71 09/10/2021   LDLDIRECT 84.0 10/05/2023   TRIG (H) 10/05/2023    427.0 Triglyceride is over 400; calculations on Lipids are invalid.   CHOLHDL 5 10/05/2023  Discussed diet and exercise. Will start on fenofibrate. Continue pravastatin. Follow lipid panel

## 2023-10-23 ENCOUNTER — Telehealth (INDEPENDENT_AMBULATORY_CARE_PROVIDER_SITE_OTHER): Payer: 59 | Admitting: Pulmonary Disease

## 2023-10-23 DIAGNOSIS — G4733 Obstructive sleep apnea (adult) (pediatric): Secondary | ICD-10-CM | POA: Diagnosis not present

## 2023-10-23 NOTE — Telephone Encounter (Signed)
HST showed severe OSA with AHI 95/ hr  >> CPAP 14 cm  SUggest auto 10-14 cm

## 2023-10-25 NOTE — Telephone Encounter (Signed)
Please call patient and let him know HST showed severe OSA. Please order auto CPAP 5-20cm h20. Advise patient to wear nightly. Needs visit with me in 6-8 weeks for compliance check in

## 2023-10-25 NOTE — Telephone Encounter (Signed)
Attempted to call patient.  VM not set up and cannot receive messages.

## 2023-10-28 ENCOUNTER — Other Ambulatory Visit: Payer: Self-pay

## 2023-10-28 ENCOUNTER — Telehealth: Payer: Self-pay

## 2023-10-28 DIAGNOSIS — E1140 Type 2 diabetes mellitus with diabetic neuropathy, unspecified: Secondary | ICD-10-CM

## 2023-10-28 MED ORDER — FREESTYLE LIBRE 3 SENSOR MISC
5 refills | Status: DC
  Filled 2023-10-28: qty 2, fill #0
  Filled 2023-11-01 – 2023-11-18 (×3): qty 2, 28d supply, fill #0

## 2023-10-28 NOTE — Telephone Encounter (Signed)
Sent to pharmacy 

## 2023-10-28 NOTE — Telephone Encounter (Signed)
Spoke with the pt and notified of results of sleep study. Pt verbalized understanding and was agreeable to CPAP therapy. I have placed DME referral for this and pt aware to contact the office for 31-90 day f/u once they begin using machine per insurance requirement.   

## 2023-10-28 NOTE — Telephone Encounter (Signed)
Called Patient but his voicemail has not been set up yet.

## 2023-10-28 NOTE — Telephone Encounter (Signed)
Patient would need an appt to go over results. If we have them yet

## 2023-10-28 NOTE — Addendum Note (Signed)
Addended by: Christen Butter on: 10/28/2023 01:47 PM   Modules accepted: Orders

## 2023-10-28 NOTE — Telephone Encounter (Signed)
Copied from CRM [id number]. Topic: Clinical - Lab/Test Results >> Oct 28, 2023  8:41 AM Almira Coaster wrote: Reason for CRM: Patient called back to go over the sleep study results, He has a new phone number which has been updated on file. He can either be reached at [phone] or [phone].

## 2023-10-28 NOTE — Telephone Encounter (Signed)
Prescription Request  10/28/2023  LOV: Visit date not found  What is the name of the medication or equipment? Continuous Glucose Sensor (FREESTYLE LIBRE 3 SENSOR) MISC  Have you contacted your pharmacy to request a refill? Yes   Which pharmacy would you like this sent to?  [facility] [address] [place] [address] Phone: [phone] Fax: [phone]    Patient notified that their request is being sent to the clinical staff for review and that they should receive a response within 2 business days.   Please advise at [facility] [phone]  Patient states his current [facility] 3 has run out and we had sent the incorrect prescription to the pharmacy.  Patient states he would like for [place] to please call him when his prescription has been sent in.

## 2023-11-01 ENCOUNTER — Other Ambulatory Visit: Payer: Self-pay

## 2023-11-01 ENCOUNTER — Other Ambulatory Visit: Payer: Self-pay | Admitting: Nurse Practitioner

## 2023-11-01 ENCOUNTER — Other Ambulatory Visit (HOSPITAL_COMMUNITY): Payer: Self-pay

## 2023-11-01 MED FILL — Insulin Lispro Soln Pen-injector 100 Unit/ML (1 Unit Dial): SUBCUTANEOUS | 30 days supply | Qty: 9 | Fill #2 | Status: AC

## 2023-11-01 MED FILL — Pravastatin Sodium Tab 80 MG: ORAL | 30 days supply | Qty: 30 | Fill #1 | Status: AC

## 2023-11-01 MED FILL — Gabapentin Cap 300 MG: ORAL | 30 days supply | Qty: 30 | Fill #1 | Status: AC

## 2023-11-04 ENCOUNTER — Telehealth: Payer: Self-pay

## 2023-11-04 ENCOUNTER — Ambulatory Visit (INDEPENDENT_AMBULATORY_CARE_PROVIDER_SITE_OTHER): Payer: 59 | Admitting: Nurse Practitioner

## 2023-11-04 ENCOUNTER — Other Ambulatory Visit: Payer: Self-pay

## 2023-11-04 ENCOUNTER — Encounter: Payer: Self-pay | Admitting: Nurse Practitioner

## 2023-11-04 VITALS — BP 122/82 | HR 70 | Temp 98.0°F | Ht 68.0 in | Wt 202.8 lb

## 2023-11-04 DIAGNOSIS — I10 Essential (primary) hypertension: Secondary | ICD-10-CM

## 2023-11-04 DIAGNOSIS — Z860100 Personal history of colon polyps, unspecified: Secondary | ICD-10-CM

## 2023-11-04 DIAGNOSIS — E785 Hyperlipidemia, unspecified: Secondary | ICD-10-CM | POA: Diagnosis not present

## 2023-11-04 DIAGNOSIS — E1140 Type 2 diabetes mellitus with diabetic neuropathy, unspecified: Secondary | ICD-10-CM | POA: Diagnosis not present

## 2023-11-04 MED ORDER — INSULIN GLARGINE 100 UNITS/ML SOLOSTAR PEN
40.0000 [IU] | PEN_INJECTOR | Freq: Every day | SUBCUTANEOUS | 11 refills | Status: DC
  Filled 2023-11-04: qty 15, 30d supply, fill #0
  Filled 2024-01-03 – 2024-02-07 (×2): qty 15, 30d supply, fill #1
  Filled 2024-06-08: qty 15, 30d supply, fill #2
  Filled 2024-08-14: qty 15, 30d supply, fill #3

## 2023-11-04 NOTE — Telephone Encounter (Signed)
 Pharmacy states Dexcom is completely covered by his Insurance.

## 2023-11-04 NOTE — Telephone Encounter (Signed)
 [facility] states the Luke [name] is the one the insurance covers and it will be no cost to the Patient.

## 2023-11-04 NOTE — Patient Instructions (Signed)
 Increased long acting insulin to 40 units daily. Check the BS fasting and before meals.

## 2023-11-04 NOTE — Progress Notes (Addendum)
 Established Patient Office Visit  Subjective:  Patient ID: Luke [name], male    DOB: [birth year]  Age: 43 y.o. MRN: [id number]  CC:  Chief Complaint  Patient presents with   Diabetes    HPI  Luke [name]  with a history of diabetes, hypertension, hyperlipidemia and right colectomy presents with concerns about fluctuating blood sugar levels. They report blood sugar readings ranging from 50 to 300, with the highest readings typically occurring in the afternoon. The patient notes that they can often sense when their blood sugar is high or low, and they manage low blood sugar episodes with immediate sugar intake, such as orange juice. They are currently on insulin , taking 14 units before meals and 34 units at night.  The patient also reports changes in their diet, including portion control and the addition of fruits. They occasionally snack on crackers. They have been experiencing frequent urination, which they attribute to their high blood sugar levels. The patient has a family history of breast and pancreatic cancer.   HPI   Past Medical History:  Diagnosis Date   Anemia    B12 deficiency    Cecum mass 08/15/2018   a.) 4.8 cm pedunculated cecal mass; Bx (+) for villous adeoma. b.) s/p RIGHT colectomy.   Chronic kidney disease    Colonic neoplasm    Columnar-lined esophagus    Diverticulosis of large intestine without diverticulitis    Dyslipidemia (high LDL; low HDL)    Gastric polyp    GERD (gastroesophageal reflux disease)    Hyperlipidemia    Hyperlipidemia associated with type 2 diabetes mellitus (HCC)    Hypertension    Peripheral polyneuropathy    Pneumonia 09/07/2016   Sleep apnea    Stomach irritation    Type 2 diabetes mellitus treated with insulin  (HCC)    Unilateral inguinal hernia, without obstruction or gangrene, not specified as recurrent     Past Surgical History:  Procedure Laterality Date   COLON SURGERY     COLONOSCOPY WITH PROPOFOL  N/A 07/28/2018    Procedure: COLONOSCOPY WITH PROPOFOL ;  Surgeon: Janalyn Keene NOVAK, MD;  Location: [facility];  Service: Endoscopy;  Laterality: N/A;   COLONOSCOPY WITH PROPOFOL  N/A 10/18/2019   Procedure: COLONOSCOPY WITH PROPOFOL ;  Surgeon: Janalyn Keene NOVAK, MD;  Location: [facility];  Service: Endoscopy;  Laterality: N/A;   COLONOSCOPY WITH PROPOFOL  N/A 02/17/2021   Procedure: COLONOSCOPY WITH PROPOFOL ;  Surgeon: Janalyn Keene NOVAK, MD;  Location: [facility];  Service: Endoscopy;  Laterality: N/A;   ESOPHAGOGASTRODUODENOSCOPY (EGD) WITH PROPOFOL  N/A 07/28/2018   Procedure: ESOPHAGOGASTRODUODENOSCOPY (EGD) WITH PROPOFOL ;  Surgeon: Janalyn Keene NOVAK, MD;  Location: [facility];  Service: Endoscopy;  Laterality: N/A;   PARTIAL COLECTOMY Right 08/15/2018   Procedure: RIGHT  COLECTOMY;  Surgeon: Nicholaus Selinda Birmingham, MD;  Location: [facility];  Service: General;  Laterality: Right;   VENTRAL HERNIA REPAIR N/A 08/17/2023   Procedure: HERNIA REPAIR VENTRAL ADULT, open;  Surgeon: Jordis Laneta FALCON, MD;  Location: [facility];  Service: General;  Laterality: N/A;   XI ROBOTIC ASSISTED INGUINAL HERNIA REPAIR WITH MESH Right 01/07/2023   Procedure: XI ROBOTIC ASSISTED INGUINAL HERNIA REPAIR WITH MESH, poss Bilateral;  Surgeon: Jordis Laneta FALCON, MD;  Location: [facility];  Service: General;  Laterality: Right;  Possible Bilateral    Family History  Problem Relation Age of Onset   Heart disease Mother    Diabetes Mother    Cancer Mother    Breast cancer Mother  Hypertension Mother    Arthritis Father    Hypertension Father    Colon cancer Paternal Uncle    CAD Maternal Grandmother    Breast cancer Maternal Grandmother    Throat cancer Maternal Grandfather    Diabetes Paternal Grandmother     Social History   Socioeconomic History   Marital status: Single    Spouse name: Not on file   Number of children: Not on file   Years of education: 12   Highest education level: Not on file  Occupational  History    Comment: [employer]  Tobacco Use   Smoking status: Former    Current packs/day: 0.00    Types: Cigarettes    Quit date: 03/25/2020    Years since quitting: 3.6    Passive exposure: Past   Smokeless tobacco: Never   Tobacco comments:    patient is social smoker/ smokes when he drinks   Vaping Use   Vaping status: Never Used  Substance and Sexual Activity   Alcohol use: Yes    Alcohol/week: 2.0 standard drinks of alcohol    Types: 2 Shots of liquor per week   Drug use: Not Currently    Types: Marijuana    Comment: last use 2020   Sexual activity: Not Currently    Birth control/protection: Condom  Other Topics Concern   Not on file  Social History Narrative   Not on file   Social Drivers of Health   Financial Resource Strain: Not on file  Food Insecurity: No Food Insecurity (04/15/2022)   Hunger Vital Sign    Worried About Running Out of Food in the Last Year: Never true    Ran Out of Food in the Last Year: Never true  Transportation Needs: No Transportation Needs (04/15/2022)   PRAPARE - Administrator, Civil Service (Medical): No    Lack of Transportation (Non-Medical): No  Physical Activity: Not on file  Stress: Not on file  Social Connections: Not on file  Intimate Partner Violence: Not on file     Outpatient Medications Prior to Visit  Medication Sig Dispense Refill   amLODipine  (NORVASC ) 5 MG tablet Take 1 tablet (5 mg total) by mouth daily. 90 tablet 3   blood glucose meter kit and supplies KIT Dispense based on patient and insurance preference. Use up to four times daily as directed. (FOR ICD-9 250.00, 250.01). 1 each 0   Continuous Glucose Sensor (DEXCOM G6 SENSOR) MISC Change the sensor every 10 days 1 each 3   Continuous Glucose Sensor (FREESTYLE LIBRE 3 SENSOR) MISC Place 1 sensor on the skin every 14 days. Use to check glucose continuously 2 each 5   fenofibrate  54 MG tablet Take 1 tablet (54 mg total) by mouth daily. 90 tablet 1    gabapentin  (NEURONTIN ) 300 MG capsule Take 1 capsule (300 mg total) by mouth at bedtime. 30 capsule 2   HYDROcodone -acetaminophen  (NORCO/VICODIN) 5-325 MG tablet Take 1-2 tablets by mouth every 4 (four) hours as needed for moderate pain (pain score 4-6). 20 tablet 0   HYDROcodone -acetaminophen  (NORCO/VICODIN) 5-325 MG tablet Take 1-2 tablets by mouth every 4 (four) hours as needed for moderate pain (pain score 4-6) 20 tablet 0   insulin  lispro (HUMALOG ) 100 UNIT/ML KwikPen Inject 10 Units into the skin 3 (three) times daily. 9 mL 2   Insulin  Pen Needle (NOVOFINE PEN NEEDLE) 32G X 6 MM MISC use as directed 100 each 98   Insulin  Pen Needle 32G X  4 MM MISC USE AS DIRECTED WITH INSULIN . 100 each PRN   pravastatin  (PRAVACHOL ) 80 MG tablet Take 1 tablet (80 mg total) by mouth daily. 30 tablet 2   No facility-administered medications prior to visit.    No Known Allergies  ROS Review of Systems Negative unless indicated in HPI.    Objective:    Physical Exam Constitutional:      Appearance: Normal appearance.  HENT:     Mouth/Throat:     Mouth: Mucous membranes are moist.  Eyes:     Conjunctiva/sclera: Conjunctivae normal.     Pupils: Pupils are equal, round, and reactive to light.  Cardiovascular:     Rate and Rhythm: Normal rate and regular rhythm.     Pulses: Normal pulses.     Heart sounds: Normal heart sounds.  Pulmonary:     Effort: Pulmonary effort is normal.     Breath sounds: Normal breath sounds.  Musculoskeletal:     Cervical back: Normal range of motion. No tenderness.  Skin:    General: Skin is warm.     Findings: No bruising.  Neurological:     General: No focal deficit present.     Mental Status: He is alert and oriented to person, place, and time. Mental status is at baseline.  Psychiatric:        Mood and Affect: Mood normal.        Behavior: Behavior normal.        Thought Content: Thought content normal.        Judgment: Judgment normal.     BP 122/82    Pulse 70   Temp 98 F (36.7 C)   Ht 5' 8 (1.727 m)   Wt 202 lb 12.8 oz (92 kg)   SpO2 99%   BMI 30.84 kg/m  Wt Readings from Last 3 Encounters:  11/04/23 202 lb 12.8 oz (92 kg)  10/07/23 199 lb (90.3 kg)  08/17/23 200 lb (90.7 kg)     Health Maintenance  Topic Date Due   Hepatitis C Screening  Never done   DTaP/Tdap/Td (1 - Tdap) Never done   OPHTHALMOLOGY EXAM  01/31/2022   Colonoscopy  02/17/2022   COVID-19 Vaccine (1) 11/20/2023 (Originally 12/27/1985)   INFLUENZA VACCINE  01/31/2024 (Originally 06/03/2023)   Diabetic kidney evaluation - Urine ACR  03/25/2024   HEMOGLOBIN A1C  04/04/2024   Diabetic kidney evaluation - eGFR measurement  10/04/2024   FOOT EXAM  11/03/2024   HIV Screening  Completed   HPV VACCINES  Aged Out    There are no preventive care reminders to display for this patient.  Lab Results  Component Value Date   TSH 0.891 02/28/2020   Lab Results  Component Value Date   WBC 6.2 10/05/2023   HGB 16.0 10/05/2023   HCT 50.3 10/05/2023   MCV 87.4 10/05/2023   PLT 191.0 10/05/2023   Lab Results  Component Value Date   NA 140 10/05/2023   K 3.7 10/05/2023   CO2 29 10/05/2023   GLUCOSE 204 (H) 10/05/2023   BUN 7 10/05/2023   CREATININE 1.21 10/05/2023   BILITOT 0.7 10/05/2023   ALKPHOS 94 10/05/2023   AST 19 10/05/2023   ALT 19 10/05/2023   PROT 7.4 10/05/2023   ALBUMIN 4.3 10/05/2023   CALCIUM  9.7 10/05/2023   ANIONGAP 12 07/27/2023   EGFR 88 03/19/2021   GFR 73.20 10/05/2023   Lab Results  Component Value Date   CHOL 171 10/05/2023   Lab  Results  Component Value Date   HDL 32.30 (L) 10/05/2023   Lab Results  Component Value Date   LDLCALC 71 09/10/2021   Lab Results  Component Value Date   TRIG (H) 10/05/2023    427.0 Triglyceride is over 400; calculations on Lipids are invalid.   Lab Results  Component Value Date   CHOLHDL 5 10/05/2023   Lab Results  Component Value Date   HGBA1C 12.0 (H) 10/05/2023      Assessment  & Plan:  Type 2 diabetes mellitus with diabetic neuropathy, unspecified whether long term insulin  use (HCC) Assessment & Plan: Blood glucose levels fluctuating with readings ranging from 50 to 300. Patient is currently on insulin  10 units before meals and 34 units at night.  Monofilament Foot exam normal. Recent Freestyle Libre readings indicate blood glucose is within target range 52% of the time. Patient reports inconsistent dietary management. -Will increase glargine to 40 units at bed time -Refer to diabetes education classes. -Check blood glucose levels fasting and pre-meal daily using CGM. -Refer for diabetic eye exam at [facility].  Orders: -     Ambulatory referral to Ophthalmology -     Amb Referral to Nutrition and Diabetic Education  History of colon polyps -     Ambulatory referral to Gastroenterology  Dyslipidemia (high LDL; low HDL) Assessment & Plan: Lab Results  Component Value Date   CHOL 171 10/05/2023   HDL 32.30 (L) 10/05/2023   LDLCALC 71 09/10/2021   LDLDIRECT 84.0 10/05/2023   TRIG (H) 10/05/2023    427.0 Triglyceride is over 400; calculations on Lipids are invalid.   CHOLHDL 5 10/05/2023  Continue pravastatin  and  fenofibrate  daily. Continue diet management.   Orders: -     Lipid panel; Future  Primary hypertension Assessment & Plan: Patient BP  Vitals:   11/04/23 0914  BP: 122/82    in the office. Advised pt to follow a low sodium and heart healthy diet. Continue amlodipine  5 mg daily    Other orders -     insulin  glargine; Inject 40 Units into the skin at bedtime.  Dispense: 15 mL; Refill: 11    Follow-up: Return in about 2 weeks (around 11/18/2023) for diabetes.   Renise Gillies, NP

## 2023-11-05 ENCOUNTER — Other Ambulatory Visit: Payer: Self-pay

## 2023-11-05 NOTE — Telephone Encounter (Signed)
 Please call pt and check if he was able to get the CGM from the pharmacy.

## 2023-11-08 NOTE — Assessment & Plan Note (Addendum)
 Blood glucose levels fluctuating with readings ranging from 50 to 300. Patient is currently on insulin  10 units before meals and 34 units at night.  Monofilament Foot exam normal. Recent Freestyle Libre readings indicate blood glucose is within target range 52% of the time. Patient reports inconsistent dietary management. -Will increase glargine to 40 units at bed time -Refer to diabetes education classes. -Check blood glucose levels fasting and pre-meal daily using CGM. -Refer for diabetic eye exam at [facility].

## 2023-11-08 NOTE — Assessment & Plan Note (Signed)
 Patient BP  Vitals:   11/04/23 0914  BP: 122/82    in the office. Advised pt to follow a low sodium and heart healthy diet. Continue amlodipine 5 mg daily

## 2023-11-08 NOTE — Assessment & Plan Note (Signed)
 Lab Results  Component Value Date   CHOL 171 10/05/2023   HDL 32.30 (L) 10/05/2023   LDLCALC 71 09/10/2021   LDLDIRECT 84.0 10/05/2023   TRIG (H) 10/05/2023    427.0 Triglyceride is over 400; calculations on Lipids are invalid.   CHOLHDL 5 10/05/2023  Continue pravastatin  and  fenofibrate  daily. Continue diet management.

## 2023-11-08 NOTE — Telephone Encounter (Signed)
 Called pt and he does not have a vm set up to leave a message to call back about his CGM.

## 2023-11-09 ENCOUNTER — Telehealth: Payer: Self-pay

## 2023-11-09 ENCOUNTER — Other Ambulatory Visit: Payer: Self-pay

## 2023-11-09 ENCOUNTER — Encounter: Payer: Self-pay | Admitting: Hematology and Oncology

## 2023-11-09 DIAGNOSIS — Z860100 Personal history of colon polyps, unspecified: Secondary | ICD-10-CM

## 2023-11-09 MED ORDER — NA SULFATE-K SULFATE-MG SULF 17.5-3.13-1.6 GM/177ML PO SOLN
1.0000 | Freq: Once | ORAL | 0 refills | Status: AC
  Filled 2023-11-09: qty 354, 1d supply, fill #0

## 2023-11-09 NOTE — Telephone Encounter (Signed)
 Gastroenterology Pre-Procedure Review  Request Date: 11/24/23 Requesting Physician: Dr. Unk  PATIENT REVIEW QUESTIONS: The patient responded to the following health history questions as indicated:    1. Are you having any GI issues? no 2. Do you have a personal history of Polyps? yes (last colonoscopy performed by Dr. Janalyn 10/18/2019 pathology noted sessile serrated and hyperplastic polyp) 3. Do you have a family history of Colon Cancer or Polyps? no 4. Diabetes Mellitus? yes (insulin  and dexcom monitor) 5. Joint replacements in the past 12 months?no 6. Major health problems in the past 3 months?no 7. Any artificial heart valves, MVP, or defibrillator?no    MEDICATIONS & ALLERGIES:    Patient reports the following regarding taking any anticoagulation/antiplatelet therapy:   Plavix, Coumadin, Eliquis, Xarelto, Lovenox , Pradaxa, Brilinta, or Effient? no Aspirin? no  Patient confirms/reports the following medications:  Current Outpatient Medications  Medication Sig Dispense Refill   amLODipine  (NORVASC ) 5 MG tablet Take 1 tablet (5 mg total) by mouth daily. 90 tablet 3   blood glucose meter kit and supplies KIT Dispense based on patient and insurance preference. Use up to four times daily as directed. (FOR ICD-9 250.00, 250.01). 1 each 0   Continuous Glucose Sensor (DEXCOM G6 SENSOR) MISC Change the sensor every 10 days 1 each 3   Continuous Glucose Sensor (FREESTYLE LIBRE 3 SENSOR) MISC Place 1 sensor on the skin every 14 days. Use to check glucose continuously 2 each 5   fenofibrate  54 MG tablet Take 1 tablet (54 mg total) by mouth daily. 90 tablet 1   gabapentin  (NEURONTIN ) 300 MG capsule Take 1 capsule (300 mg total) by mouth at bedtime. 30 capsule 2   HYDROcodone -acetaminophen  (NORCO/VICODIN) 5-325 MG tablet Take 1-2 tablets by mouth every 4 (four) hours as needed for moderate pain (pain score 4-6). 20 tablet 0   HYDROcodone -acetaminophen  (NORCO/VICODIN) 5-325 MG tablet Take  1-2 tablets by mouth every 4 (four) hours as needed for moderate pain (pain score 4-6) 20 tablet 0   insulin  glargine (LANTUS ) 100 unit/mL SOPN Inject 40 Units into the skin at bedtime. 15 mL 11   insulin  lispro (HUMALOG ) 100 UNIT/ML KwikPen Inject 10 Units into the skin 3 (three) times daily. 9 mL 2   Insulin  Pen Needle (NOVOFINE PEN NEEDLE) 32G X 6 MM MISC use as directed 100 each 98   Insulin  Pen Needle 32G X 4 MM MISC USE AS DIRECTED WITH INSULIN . 100 each PRN   pravastatin  (PRAVACHOL ) 80 MG tablet Take 1 tablet (80 mg total) by mouth daily. 30 tablet 2   No current facility-administered medications for this visit.    Patient confirms/reports the following allergies:  No Known Allergies  No orders of the defined types were placed in this encounter.   AUTHORIZATION INFORMATION Primary Insurance: 1D#: Group #:  Secondary Insurance: 1D#: Group #:  SCHEDULE INFORMATION: Date: 11/24/23 Time: Location: [facility]

## 2023-11-16 ENCOUNTER — Ambulatory Visit: Payer: 59 | Admitting: Primary Care

## 2023-11-18 ENCOUNTER — Telehealth: Payer: Self-pay

## 2023-11-18 ENCOUNTER — Other Ambulatory Visit: Payer: Self-pay

## 2023-11-18 ENCOUNTER — Ambulatory Visit (INDEPENDENT_AMBULATORY_CARE_PROVIDER_SITE_OTHER): Payer: 59 | Admitting: Nurse Practitioner

## 2023-11-18 ENCOUNTER — Encounter: Payer: Self-pay | Admitting: Nurse Practitioner

## 2023-11-18 VITALS — BP 122/82 | HR 78 | Temp 97.8°F | Ht 68.0 in | Wt 201.0 lb

## 2023-11-18 DIAGNOSIS — Z794 Long term (current) use of insulin: Secondary | ICD-10-CM | POA: Diagnosis not present

## 2023-11-18 DIAGNOSIS — E1165 Type 2 diabetes mellitus with hyperglycemia: Secondary | ICD-10-CM

## 2023-11-18 NOTE — Patient Instructions (Addendum)
We are adding a new medication, Marcelline Deist, to help manage your blood sugar. The pharmacist will assist in monitoring your levels, and we will continue efforts to obtain a continuous glucose monitor for you.  Marcelline Deist can increase the risk of hypoglycemia, please monitor and check your Blood sugar before taking insulin with food.  Stop taking insulin in the morning.     Blood Sugar Range  Less than 70 Hold all insulin and initiate hypoglycemia protocol. 70-150  0 units 151-174 2 units 175-199 4 units 200-224 6 units 225-249 8 units 250-274 10 units 275-299 12 units Greater than 300 Administer 14 units and call the provider.  Continue to take amlodipine for blood pressure control, and pravastatin and fenofibrate for cholesterol and triglyceride   Dapagliflozin Tablets What is this medication? DAPAGLIFLOZIN (DAP a gli FLOE zin) treats type 2 diabetes. It works by helping your kidneys remove sugar (glucose) from your blood through the urine, which decreases your blood sugar. It may also be used to lower the risk of worsening disease and death caused by kidney disease and heart failure. It works by helping your kidneys remove salt (sodium) from your blood through the urine. This decreases the amount of work the kidneys and heart have to do. Changes to diet and exercise are often combined with this medication. This medicine may be used for other purposes; ask your health care provider or pharmacist if you have questions. COMMON BRAND NAME(S): Marcelline Deist What should I tell my care team before I take this medication? They need to know if you have any of these conditions: Change in diet, eating less Changes to your insulin dose Dehydration Diet low in salt Frequently drink alcohol Having surgery History of diabetic ketoacidosis (DKA) History of genital infections History of pancreatitis or pancreas problems History of urinary tract infections (UTI) Kidney disease Liver disease Serious  infection Trouble passing urine Type 1 diabetes An unusual or allergic reaction to dapagliflozin, other medications, foods, dyes, or preservatives Pregnant or trying to get pregnant Breastfeeding How should I use this medication? Take this medication by mouth with water. Take it as directed on the prescription label at the same time every day. You can take it with or without food. If it upsets your stomach, take it with food. Keep taking it unless your care team tells you to stop. A special MedGuide will be given to you by the pharmacist with each prescription and refill. Be sure to read this information carefully each time. Talk to your care team about the use of this medication in children. Special care may be needed. Overdosage: If you think you have taken too much of this medicine contact a poison control center or emergency room at once. NOTE: This medicine is only for you. Do not share this medicine with others. What if I miss a dose? If you miss a dose, take it as soon as you can. If it is almost time for your next dose, take only that dose. Do not take double or extra doses. What may interact with this medication? Lithium Sulfonylureas, such as glimepiride, glipizide, glyburide This list may not describe all possible interactions. Give your health care provider a list of all the medicines, herbs, non-prescription drugs, or dietary supplements you use. Also tell them if you smoke, drink alcohol, or use illegal drugs. Some items may interact with your medicine. What should I watch for while using this medication? Visit your care team for regular checks on your progress. Tell your care team  if your symptoms do not start to get better or if they get worse. You may need blood work done while you are taking this medication. Your care team will monitor your HbA1C (A1C). This test shows what your average blood sugar (glucose) level was over the past 2 to 3 months. Know the symptoms of low blood  sugar and know how to treat it. Always carry a source of quick sugar with you. Examples include hard sugar candy or glucose tablets. Make sure others know that you can choke if you eat or drink if your blood sugar is too low and you are unable to care for yourself. Get medical help at once. Tell your care team if you have high blood sugar. Your medication dose may change if your body is under stress. Some types of stress that may affect your blood sugar include fever, infection, and surgery. This medication may increase the risk of diabetic ketoacidosis (DKA), a serious condition in which high ketone levels make your blood too acidic. Your care team may ask you to check for ketones in your urine or blood while you are taking this medication. If you develop nausea, vomiting, stomach pain, unusual weakness or fatigue, or trouble breathing, stop taking this medication and call your care team right away. Check with your care team if you have severe diarrhea, nausea, and vomiting, or if you sweat a lot. The loss of too much body fluid may make it dangerous for you to take this medication. Wear a medical ID bracelet or chain. Carry a card that describes your condition. List the medications and doses you take on the card. What side effects may I notice from receiving this medication? Side effects that you should report to your care team as soon as possible: Allergic reactions--skin rash, itching, hives, swelling of the face, lips, tongue, or throat Dehydration--increased thirst, dry mouth, feeling faint or lightheaded, headache, dark yellow or brown urine Diabetic ketoacidosis (DKA)--increased thirst or amount of urine, dry mouth, fatigue, fruity odor to breath, trouble breathing, stomach pain, nausea, vomiting Genital yeast infection--redness, swelling, pain, or itchiness, odor, thick or lumpy discharge New pain or tenderness, change in skin color, sores or ulcers, infection of the leg or foot Infection or  redness, swelling, tenderness, or pain in the genitals, or area from the genitals to the back of the rectum Urinary tract infection (UTI)--burning when passing urine, passing frequent small amounts of urine, bloody or cloudy urine, pain in the lower back or sides This list may not describe all possible side effects. Call your doctor for medical advice about side effects. You may report side effects to FDA at 1-800-FDA-1088. Where should I keep my medication? Keep out of the reach of children and pets. Store at room temperature between 20 and 25 degrees C (68 and 77 degrees F). Get rid of any unused medication after the expiration date. To get rid of medications that are no longer needed or have expired: Take the medication to a medication take-back program. Check with your pharmacy or law enforcement to find a location. If you cannot return the medication, check the label or package insert to see if the medication should be thrown out in the garbage or flushed down the toilet. If you are not sure, ask your care team. If it is safe to put it in the trash, take the medication out of the container. Mix the medication with cat litter, dirt, coffee grounds, or other unwanted substance. Seal the mixture in a  bag or container. Put it in the trash. NOTE: This sheet is a summary. It may not cover all possible information. If you have questions about this medicine, talk to your doctor, pharmacist, or health care provider.  2024 Elsevier/Gold Standard (2023-01-27 00:00:00)

## 2023-11-18 NOTE — Progress Notes (Signed)
Established Patient Office Visit  Subjective:  Patient ID: Luke [name], male    DOB: Mar 08, 1981  Age: 43 y.o. MRN: [id number]  CC:  Chief Complaint  Patient [name] with   Medical Management of Chronic Issues   Discussed the use of a AI scribe  software for clinical note transcription with the patient, who gave verbal consent to proceed.  HPI  Luke [name] for Diabetes follow up. He reports ongoing issues with obtaining his continuous glucose monitor (CGM). He has been monitoring his blood glucose levels at home using finger pricks, with recent readings ranging from 120 to 220. He has noticed an improvement in his energy levels since increasing his Lantus dosage. However, he has only been checking his blood glucose levels in the morning, not during mealtimes.  The patient also reports an issue with frequent urination, which is likely due to his elevated blood sugar levels. He has not experienced any episodes of hypoglycemia since his last visit.   In terms of lifestyle, the patient has been maintaining a diet of baked chicken and broccoli and has been engaging in physical activity, including walking.   The patient has multiple upcoming appointments, including an eye doctor appointment, a colonoscopy, and diabetic education classes.  HPI   Past Medical History:  Diagnosis Date   Anemia    B12 deficiency    Cecum mass 08/15/2018   a.) 4.8 cm pedunculated cecal mass; Bx (+) for villous adeoma. b.) s/p RIGHT colectomy.   Chronic kidney disease    Colonic neoplasm    Columnar-lined esophagus    Diverticulosis of large intestine without diverticulitis    Dyslipidemia (high LDL; low HDL)    Gastric polyp    GERD (gastroesophageal reflux disease)    Hyperlipidemia    Hyperlipidemia associated with type 2 diabetes mellitus (HCC)    Hypertension    Peripheral polyneuropathy    Pneumonia 09/07/2016   Sleep apnea    Stomach irritation    Type 2 diabetes mellitus  treated with insulin (HCC)    Unilateral inguinal hernia, without obstruction or gangrene, not specified as recurrent     Past Surgical History:  Procedure Laterality Date   COLON SURGERY     COLONOSCOPY WITH PROPOFOL N/A 07/28/2018   Procedure: COLONOSCOPY WITH PROPOFOL;  Surgeon: Pasty Spillers, MD;  Location: [facility];  Service: Endoscopy;  Laterality: N/A;   COLONOSCOPY WITH PROPOFOL N/A 10/18/2019   Procedure: COLONOSCOPY WITH PROPOFOL;  Surgeon: Pasty Spillers, MD;  Location: [facility];  Service: Endoscopy;  Laterality: N/A;   COLONOSCOPY WITH PROPOFOL N/A 02/17/2021   Procedure: COLONOSCOPY WITH PROPOFOL;  Surgeon: Pasty Spillers, MD;  Location: [facility];  Service: Endoscopy;  Laterality: N/A;   ESOPHAGOGASTRODUODENOSCOPY (EGD) WITH PROPOFOL N/A 07/28/2018   Procedure: ESOPHAGOGASTRODUODENOSCOPY (EGD) WITH PROPOFOL;  Surgeon: Pasty Spillers, MD;  Location: [facility];  Service: Endoscopy;  Laterality: N/A;   PARTIAL COLECTOMY Right 08/15/2018   Procedure: RIGHT  COLECTOMY;  Surgeon: Ancil Linsey, MD;  Location: [facility];  Service: General;  Laterality: Right;   VENTRAL HERNIA REPAIR N/A 08/17/2023   Procedure: HERNIA REPAIR VENTRAL ADULT, open;  Surgeon: Leafy Ro, MD;  Location: [facility];  Service: General;  Laterality: N/A;   XI ROBOTIC ASSISTED INGUINAL HERNIA REPAIR WITH MESH Right 01/07/2023   Procedure: XI ROBOTIC ASSISTED INGUINAL HERNIA REPAIR WITH MESH, poss Bilateral;  Surgeon: Leafy Ro, MD;  Location: [facility];  Service: General;  Laterality: Right;  Possible Bilateral    Family History  Problem Relation Age of Onset   Heart disease Mother    Diabetes Mother    Cancer Mother    Breast cancer Mother    Hypertension Mother    Arthritis Father    Hypertension Father    Colon cancer Paternal Uncle    CAD Maternal Grandmother    Breast cancer Maternal Grandmother    Throat cancer Maternal Grandfather    Diabetes  Paternal Grandmother     Social History   Socioeconomic History   Marital status: Single    Spouse name: Not on file   Number of children: Not on file   Years of education: 12   Highest education level: Not on file  Occupational History    Comment: [employer]  Tobacco Use   Smoking status: Former    Current packs/day: 0.00    Types: Cigarettes    Quit date: 03/25/2020    Years since quitting: 3.6    Passive exposure: Past   Smokeless tobacco: Never   Tobacco comments:    patient is social smoker/ smokes when he drinks   Vaping Use   Vaping status: Never Used  Substance and Sexual Activity   Alcohol use: Yes    Alcohol/week: 2.0 standard drinks of alcohol    Types: 2 Shots of liquor per week   Drug use: Not Currently    Types: Marijuana    Comment: last use 2020   Sexual activity: Not Currently    Birth control/protection: Condom  Other Topics Concern   Not on file  Social History Narrative   Not on file   Social Drivers of Health   Financial Resource Strain: Not on file  Food Insecurity: No Food Insecurity (04/15/2022)   Hunger Vital Sign    Worried About Running Out of Food in the Last Year: Never true    Ran Out of Food in the Last Year: Never true  Transportation Needs: No Transportation Needs (04/15/2022)   PRAPARE - Administrator, Civil Service (Medical): No    Lack of Transportation (Non-Medical): No  Physical Activity: Not on file  Stress: Not on file  Social Connections: Not on file  Intimate Partner Violence: Not on file     Outpatient Medications Prior to Visit  Medication Sig Dispense Refill   amLODipine (NORVASC) 5 MG tablet Take 1 tablet (5 mg total) by mouth daily. 90 tablet 3   blood glucose meter kit and supplies KIT Dispense based on patient and insurance preference. Use up to four times daily as directed. (FOR ICD-9 250.00, 250.01). 1 each 0   Continuous Glucose Sensor (DEXCOM G6 SENSOR) MISC Change the sensor every 10 days 1  each 3   Continuous Glucose Sensor (FREESTYLE LIBRE 3 SENSOR) MISC Place 1 sensor on the skin every 14 days. Use to check glucose continuously 2 each 5   fenofibrate 54 MG tablet Take 1 tablet (54 mg total) by mouth daily. 90 tablet 1   gabapentin (NEURONTIN) 300 MG capsule Take 1 capsule (300 mg total) by mouth at bedtime. 30 capsule 2   HYDROcodone-acetaminophen (NORCO/VICODIN) 5-325 MG tablet Take 1-2 tablets by mouth every 4 (four) hours as needed for moderate pain (pain score 4-6). 20 tablet 0   HYDROcodone-acetaminophen (NORCO/VICODIN) 5-325 MG tablet Take 1-2 tablets by mouth every 4 (four) hours as needed for moderate pain (pain score 4-6) 20 tablet 0   insulin glargine (LANTUS) 100 unit/mL SOPN Inject 40  Units into the skin at bedtime. 15 mL 11   insulin lispro (HUMALOG) 100 UNIT/ML KwikPen Inject 10 Units into the skin 3 (three) times daily. 9 mL 2   Insulin Pen Needle (NOVOFINE PEN NEEDLE) 32G X 6 MM MISC use as directed 100 each 98   Insulin Pen Needle 32G X 4 MM MISC USE AS DIRECTED WITH INSULIN. 100 each PRN   pravastatin (PRAVACHOL) 80 MG tablet Take 1 tablet (80 mg total) by mouth daily. 30 tablet 2   No facility-administered medications prior to visit.    No Known Allergies  ROS Review of Systems Negative unless indicated in HPI.    Objective:    Physical Exam Constitutional:      Appearance: Normal appearance.  HENT:     Mouth/Throat:     Mouth: Mucous membranes are moist.  Eyes:     Conjunctiva/sclera: Conjunctivae normal.     Pupils: Pupils are equal, round, and reactive to light.  Cardiovascular:     Rate and Rhythm: Normal rate and regular rhythm.     Pulses: Normal pulses.     Heart sounds: Normal heart sounds.  Pulmonary:     Effort: Pulmonary effort is normal.     Breath sounds: Normal breath sounds.  Musculoskeletal:     Cervical back: Normal range of motion. No tenderness.  Skin:    General: Skin is warm.     Findings: No bruising.   Neurological:     General: No focal deficit present.     Mental Status: He is alert and oriented to person, place, and time. Mental status is at baseline.  Psychiatric:        Mood and Affect: Mood normal.        Behavior: Behavior normal.        Thought Content: Thought content normal.        Judgment: Judgment normal.     BP 122/82   Pulse 78   Temp 97.8 F (36.6 C)   Ht 5\' 8"  (1.727 m)   Wt 201 lb (91.2 kg)   SpO2 98%   BMI 30.56 kg/m  Wt Readings from Last 3 Encounters:  11/18/23 201 lb (91.2 kg)  11/04/23 202 lb 12.8 oz (92 kg)  10/07/23 199 lb (90.3 kg)     Health Maintenance  Topic Date Due   Pneumococcal Vaccine 68-33 Years old (1 of 2 - PCV) Never done   Hepatitis C Screening  Never done   DTaP/Tdap/Td (1 - Tdap) Never done   OPHTHALMOLOGY EXAM  01/31/2022   Colonoscopy  02/17/2022   COVID-19 Vaccine (1) 11/20/2023 (Originally 12/27/1985)   INFLUENZA VACCINE  01/31/2024 (Originally 06/03/2023)   Diabetic kidney evaluation - Urine ACR  03/25/2024   HEMOGLOBIN A1C  04/04/2024   Diabetic kidney evaluation - eGFR measurement  10/04/2024   FOOT EXAM  11/03/2024   HIV Screening  Completed   HPV VACCINES  Aged Out    There are no preventive care reminders to display for this patient.  Lab Results  Component Value Date   TSH 0.891 02/28/2020   Lab Results  Component Value Date   WBC 6.2 10/05/2023   HGB 16.0 10/05/2023   HCT 50.3 10/05/2023   MCV 87.4 10/05/2023   PLT 191.0 10/05/2023   Lab Results  Component Value Date   NA 140 10/05/2023   K 3.7 10/05/2023   CO2 29 10/05/2023   GLUCOSE 204 (H) 10/05/2023   BUN 7 10/05/2023   CREATININE  1.21 10/05/2023   BILITOT 0.7 10/05/2023   ALKPHOS 94 10/05/2023   AST 19 10/05/2023   ALT 19 10/05/2023   PROT 7.4 10/05/2023   ALBUMIN 4.3 10/05/2023   CALCIUM 9.7 10/05/2023   ANIONGAP 12 07/27/2023   EGFR 88 03/19/2021   GFR 73.20 10/05/2023   Lab Results  Component Value Date   CHOL 171 10/05/2023    Lab Results  Component Value Date   HDL 32.30 (L) 10/05/2023   Lab Results  Component Value Date   LDLCALC 71 09/10/2021   Lab Results  Component Value Date   TRIG (H) 10/05/2023    427.0 Triglyceride is over 400; calculations on Lipids are invalid.   Lab Results  Component Value Date   CHOLHDL 5 10/05/2023   Lab Results  Component Value Date   HGBA1C 12.0 (H) 10/05/2023      Assessment & Plan:  Type 2 diabetes mellitus with hyperglycemia, with long-term current use of insulin (HCC) Assessment & Plan: Patient is checking sugars in the morning only. Currently on long-acting insulin at night and mealtime insulin. Patient reports feeling better with increased dose of long-acting insulin at night. -Continue 40 Units of glargine and will switch to sliding scale during meal time. -Encourage patient to check blood sugars during mealtimes as well. -Consider adding Farxiga to regimen, discuss potential side effects including increased urination and risk of urinary tract infections. -Will continue to monitor.  Orders: -     Dapagliflozin Propanediol; Take 1 tablet (5 mg total) by mouth daily before breakfast.  Dispense: 90 tablet; Refill: 1  Other orders -     Dapagliflozin Propanediol; Take 1 tablet (5 mg total) by mouth daily before breakfast.  Dispense: 14 tablet    Follow-up: Return in about 1 month (around 12/19/2023).   Kara Dies, NP

## 2023-11-18 NOTE — Telephone Encounter (Signed)
Patient needs a PA for Dexcom G6 sensor.

## 2023-11-18 NOTE — Progress Notes (Signed)
Medication Samples have been provided to the patient.  Drug name: Marcelline Deist       Strength: 5 mg        Qty: 2  LOT: [id number]  Exp.Date: 06/01/2025  Dosing instructions: Take a 5 mg pill daily  The patient has been instructed regarding the correct time, dose, and frequency of taking this medication, including desired effects and most common side effects.   Francene Boyers 10:27 AM 11/18/2023

## 2023-11-20 MED ORDER — DAPAGLIFLOZIN PROPANEDIOL 5 MG PO TABS
5.0000 mg | ORAL_TABLET | Freq: Every day | ORAL | 1 refills | Status: DC
  Filled 2023-11-20 – 2023-11-25 (×2): qty 90, 90d supply, fill #0

## 2023-11-20 MED ORDER — DAPAGLIFLOZIN PROPANEDIOL 5 MG PO TABS: 5.0000 mg | ORAL_TABLET | Freq: Every day | ORAL | Status: DC

## 2023-11-20 NOTE — Assessment & Plan Note (Signed)
Patient is checking sugars in the morning only. Currently on long-acting insulin at night and mealtime insulin. Patient reports feeling better with increased dose of long-acting insulin at night. -Continue 40 Units of glargine and will switch to sliding scale during meal time. -Encourage patient to check blood sugars during mealtimes as well. -Consider adding Farxiga to regimen, discuss potential side effects including increased urination and risk of urinary tract infections. -Will continue to monitor.

## 2023-11-21 ENCOUNTER — Other Ambulatory Visit: Payer: Self-pay

## 2023-11-22 ENCOUNTER — Other Ambulatory Visit: Payer: Self-pay

## 2023-11-23 ENCOUNTER — Telehealth: Payer: Self-pay

## 2023-11-23 ENCOUNTER — Other Ambulatory Visit (HOSPITAL_COMMUNITY): Payer: Self-pay

## 2023-11-23 NOTE — Telephone Encounter (Signed)
Pharmacy Patient Advocate Encounter   Received notification from Pt Calls Messages that prior authorization for Dexcom G6 Sensor is required/requested.   Insurance verification completed.   The patient is insured through [facility] .   Per test claim: PA required; PA submitted to above mentioned insurance via CoverMyMeds Key/confirmation #/EOC [id number] Status is pending

## 2023-11-23 NOTE — Telephone Encounter (Signed)
Noted  

## 2023-11-23 NOTE — Telephone Encounter (Signed)
Pharmacy Patient Advocate Encounter   Received notification from Pt Calls Messages that prior authorization for Farxiga 5MG  tablets is required/requested.   Insurance verification completed.   The patient is insured through [facility] .   Per test claim: PA required; PA submitted to above mentioned insurance via CoverMyMeds Key/confirmation #/EOC [id number] Status is pending

## 2023-11-23 NOTE — Telephone Encounter (Signed)
The patient call left a voicemail requesting for [place] to give him a call back. I called the patient back to let know that we received his message, and he said he already spoke to somebody.

## 2023-11-24 ENCOUNTER — Ambulatory Visit: Payer: 59 | Admitting: Anesthesiology

## 2023-11-24 ENCOUNTER — Encounter
Admission: RE | Disposition: A | Discharge status: Home / Self Care | Payer: Self-pay | Source: Home / Self Care | Attending: Gastroenterology

## 2023-11-24 ENCOUNTER — Other Ambulatory Visit (HOSPITAL_COMMUNITY): Payer: Self-pay

## 2023-11-24 ENCOUNTER — Telehealth: Payer: Self-pay | Admitting: Pharmacy Technician

## 2023-11-24 ENCOUNTER — Encounter: Payer: Self-pay | Admitting: Gastroenterology

## 2023-11-24 ENCOUNTER — Other Ambulatory Visit: Payer: Self-pay

## 2023-11-24 ENCOUNTER — Ambulatory Visit
Admission: RE | Admit: 2023-11-24 | Discharge: 2023-11-24 | Disposition: A | Discharge status: Home / Self Care | Payer: 59 | Attending: Gastroenterology | Admitting: Gastroenterology

## 2023-11-24 DIAGNOSIS — E1165 Type 2 diabetes mellitus with hyperglycemia: Secondary | ICD-10-CM | POA: Insufficient documentation

## 2023-11-24 DIAGNOSIS — Z794 Long term (current) use of insulin: Secondary | ICD-10-CM | POA: Diagnosis not present

## 2023-11-24 DIAGNOSIS — D631 Anemia in chronic kidney disease: Secondary | ICD-10-CM | POA: Diagnosis not present

## 2023-11-24 DIAGNOSIS — E1122 Type 2 diabetes mellitus with diabetic chronic kidney disease: Secondary | ICD-10-CM | POA: Insufficient documentation

## 2023-11-24 DIAGNOSIS — Z87891 Personal history of nicotine dependence: Secondary | ICD-10-CM | POA: Insufficient documentation

## 2023-11-24 DIAGNOSIS — K5730 Diverticulosis of large intestine without perforation or abscess without bleeding: Secondary | ICD-10-CM | POA: Insufficient documentation

## 2023-11-24 DIAGNOSIS — Z860100 Personal history of colon polyps, unspecified: Secondary | ICD-10-CM

## 2023-11-24 DIAGNOSIS — I129 Hypertensive chronic kidney disease with stage 1 through stage 4 chronic kidney disease, or unspecified chronic kidney disease: Secondary | ICD-10-CM | POA: Diagnosis not present

## 2023-11-24 DIAGNOSIS — Z1211 Encounter for screening for malignant neoplasm of colon: Secondary | ICD-10-CM | POA: Diagnosis not present

## 2023-11-24 DIAGNOSIS — Z7984 Long term (current) use of oral hypoglycemic drugs: Secondary | ICD-10-CM | POA: Insufficient documentation

## 2023-11-24 DIAGNOSIS — N189 Chronic kidney disease, unspecified: Secondary | ICD-10-CM | POA: Diagnosis not present

## 2023-11-24 DIAGNOSIS — Z860101 Personal history of adenomatous and serrated colon polyps: Secondary | ICD-10-CM | POA: Insufficient documentation

## 2023-11-24 DIAGNOSIS — G4730 Sleep apnea, unspecified: Secondary | ICD-10-CM | POA: Insufficient documentation

## 2023-11-24 DIAGNOSIS — Z980 Intestinal bypass and anastomosis status: Secondary | ICD-10-CM | POA: Diagnosis not present

## 2023-11-24 DIAGNOSIS — K219 Gastro-esophageal reflux disease without esophagitis: Secondary | ICD-10-CM | POA: Diagnosis not present

## 2023-11-24 HISTORY — PX: COLONOSCOPY WITH PROPOFOL: SHX5780

## 2023-11-24 LAB — GLUCOSE, CAPILLARY: Glucose-Capillary: 137 mg/dL — ABNORMAL HIGH (ref 70–99)

## 2023-11-24 SURGERY — COLONOSCOPY WITH PROPOFOL
Anesthesia: General

## 2023-11-24 MED ORDER — LIDOCAINE HCL (CARDIAC) PF 100 MG/5ML IV SOSY
PREFILLED_SYRINGE | INTRAVENOUS | Status: DC | PRN
  Administered 2023-11-24: 60 mg via INTRAVENOUS

## 2023-11-24 MED ORDER — PROPOFOL 10 MG/ML IV BOLUS
INTRAVENOUS | Status: DC | PRN
  Administered 2023-11-24 (×2): 30 mg via INTRAVENOUS
  Administered 2023-11-24: 80 mg via INTRAVENOUS

## 2023-11-24 MED ORDER — PROPOFOL 500 MG/50ML IV EMUL
INTRAVENOUS | Status: DC | PRN
  Administered 2023-11-24: 140 ug/kg/min via INTRAVENOUS

## 2023-11-24 MED ORDER — SODIUM CHLORIDE 0.9 % IV SOLN: INTRAVENOUS | Status: DC

## 2023-11-24 NOTE — Telephone Encounter (Signed)
Pharmacy Patient Advocate Encounter   Received notification from CoverMyMeds that prior authorization for Farxiga 5MG  tablets  is required/requested.   Insurance verification completed.   The patient is insured through [employer] .   Per test claim: PA required; PA submitted to above mentioned insurance via CoverMyMeds Key/confirmation #/EOC [id number] Status is pending

## 2023-11-24 NOTE — Telephone Encounter (Signed)
Pharmacy Patient Advocate Encounter  Received notification from [facility] that Prior Authorization for Dexcom G6 Sensor has been APPROVED from 11/23/23 to 05/22/24. Ran test claim, Copay is $0.00. This test claim was processed through [facility]- copay amounts may vary at other pharmacies due to pharmacy/plan contracts, or as the patient moves through the different stages of their insurance plan.  **test claim was ran with primary insurance and medicaid**  [place] #/Case ID/Reference #: [id number]

## 2023-11-24 NOTE — Telephone Encounter (Signed)
Pharmacy Patient Advocate Encounter   Received notification from CoverMyMeds that prior authorization for Dapagliflozin Propanediol 5MG  tablets is required/requested.   Insurance verification completed.   The patient is insured through [facility] .   Per test claim: PA required; PA submitted to above mentioned insurance via CoverMyMeds Key/confirmation #/EOC [id number] Status is pending

## 2023-11-24 NOTE — H&P (Signed)
Arlyss Repress, MD [address]  [address]  [place], [place] [address]  Main: [phone]  Fax: [phone] Pager: [phone]  Primary Care Physician:  Kara Dies, NP Primary Gastroenterologist:  Dr. Arlyss Repress  Pre-Procedure History & Physical: HPI:  Luke [name] is a 43 y.o. male is here for an colonoscopy.   Past Medical History:  Diagnosis Date   Anemia    B12 deficiency    Cecum mass 08/15/2018   a.) 4.8 cm pedunculated cecal mass; Bx (+) for villous adeoma. b.) s/p RIGHT colectomy.   Chronic kidney disease    Colonic neoplasm    Columnar-lined esophagus    Diverticulosis of large intestine without diverticulitis    Dyslipidemia (high LDL; low HDL)    Gastric polyp    GERD (gastroesophageal reflux disease)    Hyperlipidemia    Hyperlipidemia associated with type 2 diabetes mellitus (HCC)    Hypertension    Peripheral polyneuropathy    Pneumonia 09/07/2016   Sleep apnea    Stomach irritation    Type 2 diabetes mellitus treated with insulin (HCC)    Unilateral inguinal hernia, without obstruction or gangrene, not specified as recurrent     Past Surgical History:  Procedure Laterality Date   COLON SURGERY     COLONOSCOPY WITH PROPOFOL N/A 07/28/2018   Procedure: COLONOSCOPY WITH PROPOFOL;  Surgeon: Pasty Spillers, MD;  Location: [facility] ENDOSCOPY;  Service: Endoscopy;  Laterality: N/A;   COLONOSCOPY WITH PROPOFOL N/A 10/18/2019   Procedure: COLONOSCOPY WITH PROPOFOL;  Surgeon: Pasty Spillers, MD;  Location: [facility] ENDOSCOPY;  Service: Endoscopy;  Laterality: N/A;   COLONOSCOPY WITH PROPOFOL N/A 02/17/2021   Procedure: COLONOSCOPY WITH PROPOFOL;  Surgeon: Pasty Spillers, MD;  Location: [facility] ENDOSCOPY;  Service: Endoscopy;  Laterality: N/A;   ESOPHAGOGASTRODUODENOSCOPY (EGD) WITH PROPOFOL N/A 07/28/2018   Procedure: ESOPHAGOGASTRODUODENOSCOPY (EGD) WITH PROPOFOL;  Surgeon: Pasty Spillers, MD;  Location: [facility] ENDOSCOPY;  Service:  Endoscopy;  Laterality: N/A;   PARTIAL COLECTOMY Right 08/15/2018   Procedure: RIGHT  COLECTOMY;  Surgeon: Ancil Linsey, MD;  Location: [facility] ORS;  Service: General;  Laterality: Right;   VENTRAL HERNIA REPAIR N/A 08/17/2023   Procedure: HERNIA REPAIR VENTRAL ADULT, open;  Surgeon: Leafy Ro, MD;  Location: [facility] ORS;  Service: General;  Laterality: N/A;   XI ROBOTIC ASSISTED INGUINAL HERNIA REPAIR WITH MESH Right 01/07/2023   Procedure: XI ROBOTIC ASSISTED INGUINAL HERNIA REPAIR WITH MESH, poss Bilateral;  Surgeon: Leafy Ro, MD;  Location: [facility] ORS;  Service: General;  Laterality: Right;  Possible Bilateral    Prior to Admission medications   Medication Sig Start Date End Date Taking? Authorizing Provider  amLODipine (NORVASC) 5 MG tablet Take 1 tablet (5 mg total) by mouth daily. 07/26/23 12/01/23 Yes Kara Dies, NP  blood glucose meter kit and supplies KIT Dispense based on patient and insurance preference. Use up to four times daily as directed. (FOR ICD-9 250.00, 250.01). 04/04/20   Iloabachie, Chioma E, NP  Continuous Glucose Sensor (DEXCOM G6 SENSOR) MISC Change the sensor every 10 days 10/19/23   Kara Dies, NP  Continuous Glucose Sensor (FREESTYLE LIBRE 3 SENSOR) MISC Place 1 sensor on the skin every 14 days. Use to check glucose continuously 10/28/23   Kara Dies, NP  dapagliflozin propanediol (FARXIGA) 5 MG TABS tablet Take 1 tablet (5 mg total) by mouth daily before breakfast. 11/20/23  Yes Kara Dies, NP  dapagliflozin propanediol (FARXIGA) 5 MG TABS tablet Take 1  tablet (5 mg total) by mouth daily before breakfast. 11/20/23   Kara Dies, NP  fenofibrate 54 MG tablet Take 1 tablet (54 mg total) by mouth daily. 10/07/23  Yes Kara Dies, NP  gabapentin (NEURONTIN) 300 MG capsule Take 1 capsule (300 mg total) by mouth at bedtime. 09/27/23 12/26/23 Yes Kara Dies, NP  HYDROcodone-acetaminophen (NORCO/VICODIN) 5-325 MG tablet Take 1-2  tablets by mouth every 4 (four) hours as needed for moderate pain (pain score 4-6). 08/17/23   Pabon, Merri Ray, MD  HYDROcodone-acetaminophen (NORCO/VICODIN) 5-325 MG tablet Take 1-2 tablets by mouth every 4 (four) hours as needed for moderate pain (pain score 4-6) 08/17/23   Pabon, Diego F, MD  insulin glargine (LANTUS) 100 unit/mL SOPN Inject 40 Units into the skin at bedtime. 11/04/23  Yes Kara Dies, NP  insulin lispro (HUMALOG) 100 UNIT/ML KwikPen Inject 10 Units into the skin 3 (three) times daily. 08/30/23   Kara Dies, NP  Insulin Pen Needle (NOVOFINE PEN NEEDLE) 32G X 6 MM MISC use as directed 01/27/22   Skipper Cliche, [facility]  Insulin Pen Needle 32G X 4 MM MISC USE AS DIRECTED WITH INSULIN. 01/27/22   Skipper Cliche, RPH  pravastatin (PRAVACHOL) 80 MG tablet Take 1 tablet (80 mg total) by mouth daily. 09/27/23 12/26/23 Yes Kara Dies, NP  metFORMIN (GLUCOPHAGE) 850 MG tablet Take 1 tablet (850 mg total) by mouth 2 (two) times daily with a meal. 12/18/20 12/19/20  Iloabachie, Chioma E, NP    Allergies as of 11/09/2023   (No Known Allergies)    Family History  Problem Relation Age of Onset   Heart disease Mother    Diabetes Mother    Cancer Mother    Breast cancer Mother    Hypertension Mother    Arthritis Father    Hypertension Father    Colon cancer Paternal Uncle    CAD Maternal Grandmother    Breast cancer Maternal Grandmother    Throat cancer Maternal Grandfather    Diabetes Paternal Grandmother     Social History   Socioeconomic History   Marital status: Single    Spouse name: Not on file   Number of children: Not on file   Years of education: 12   Highest education level: Not on file  Occupational History    Comment: [employer]  Tobacco Use   Smoking status: Former    Current packs/day: 0.00    Types: Cigarettes    Quit date: 03/25/2020    Years since quitting: 3.6    Passive exposure: Past   Smokeless tobacco: Never   Tobacco comments:     patient is social smoker/ smokes when he drinks   Vaping Use   Vaping status: Never Used  Substance and Sexual Activity   Alcohol use: Yes    Alcohol/week: 2.0 standard drinks of alcohol    Types: 2 Shots of liquor per week   Drug use: Not Currently    Types: Marijuana    Comment: last use 2020   Sexual activity: Not Currently    Birth control/protection: Condom  Other Topics Concern   Not on file  Social History Narrative   Not on file   Social Drivers of Health   Financial Resource Strain: Not on file  Food Insecurity: No Food Insecurity (04/15/2022)   Hunger Vital Sign    Worried About Running Out of Food in the Last Year: Never true    Ran Out of Food in the Last Year: Never true  Transportation Needs: No Transportation Needs (04/15/2022)   PRAPARE - Administrator, Civil Service (Medical): No    Lack of Transportation (Non-Medical): No  Physical Activity: Not on file  Stress: Not on file  Social Connections: Not on file  Intimate Partner Violence: Not on file    Review of Systems: See HPI, otherwise negative ROS  Physical Exam: BP (!) 135/97   Pulse (!) 58   Temp (!) 96.8 F (36 C) (Temporal)   Resp 16   Wt 92.1 kg   SpO2 99%   BMI 30.87 kg/m  General:   Alert,  pleasant and cooperative in NAD Head:  Normocephalic and atraumatic. Neck:  Supple; no masses or thyromegaly. Lungs:  Clear throughout to auscultation.    Heart:  Regular rate and rhythm. Abdomen:  Soft, nontender and nondistended. Normal bowel sounds, without guarding, and without rebound.   Neurologic:  Alert and  oriented x4;  grossly normal neurologically.  Impression/Plan: SHRITAN BLATCHFORD is here for an colonoscopy to be performed for h/o colon adenoma  Risks, benefits, limitations, and alternatives regarding  colonoscopy have been reviewed with the patient.  Questions have been answered.  All parties agreeable.   Lannette Donath, MD  11/24/2023, 8:23 AM

## 2023-11-24 NOTE — Op Note (Signed)
[facility] Gastroenterology Patient Name: Luke [name] Procedure Date: 11/24/2023 9:10 AM MRN: [id number] Account #: [id number] Date of Birth: 15-Jan-1981 Admit Type: Outpatient Age: 43 Room: [facility] ENDO ROOM 1 Gender: Male Note Status: Finalized Instrument Name: Prentice Docker [id number] Procedure:             Colonoscopy Indications:           Surveillance: History of adenomatous polyps,                         inadequate prep on last exam (<19yr), Last colonoscopy:                         April 2022 Providers:             Toney Reil MD, MD Referring MD:          Kara Dies (Referring MD) Medicines:             General Anesthesia Complications:         No immediate complications. Estimated blood loss: None. Procedure:             Pre-Anesthesia Assessment:                        - Prior to the procedure, a History and Physical was                         performed, and patient medications and allergies were                         reviewed. The patient is competent. The risks and                         benefits of the procedure and the sedation options and                         risks were discussed with the patient. All questions                         were answered and informed consent was obtained.                         Patient identification and proposed procedure were                         verified by the physician, the nurse, the                         anesthesiologist, the anesthetist and the technician                         in the pre-procedure area in the procedure room in the                         endoscopy suite. Mental Status Examination: alert and                         oriented. Airway Examination: normal oropharyngeal  airway and neck mobility. Respiratory Examination:                         clear to auscultation. CV Examination: normal.                         Prophylactic Antibiotics: The patient does  not require                         prophylactic antibiotics. Prior Anticoagulants: The                         patient has taken no anticoagulant or antiplatelet                         agents. ASA Grade Assessment: III - A patient with                         severe systemic disease. After reviewing the risks and                         benefits, the patient was deemed in satisfactory                         condition to undergo the procedure. The anesthesia                         plan was to use general anesthesia. Immediately prior                         to administration of medications, the patient was                         re-assessed for adequacy to receive sedatives. The                         heart rate, respiratory rate, oxygen saturations,                         blood pressure, adequacy of pulmonary ventilation, and                         response to care were monitored throughout the                         procedure. The physical status of the patient was                         re-assessed after the procedure.                        After obtaining informed consent, the colonoscope was                         passed under direct vision. Throughout the procedure,                         the patient's blood pressure, pulse, and oxygen  saturations were monitored continuously. The                         Colonoscope was introduced through the anus and                         advanced to the the ileocolonic anastomosis. The                         colonoscopy was performed without difficulty. The                         patient tolerated the procedure well. The quality of                         the bowel preparation was adequate to identify polyps                         greater than 5 mm in size. Findings:      The perianal and digital rectal examinations were normal. Pertinent       negatives include normal sphincter tone and no palpable rectal  lesions.      Multiple small-mouthed diverticula were found in the left colon.      There was evidence of a prior end-to-side ileo-colonic anastomosis in       the ascending colon. This was patent and was characterized by healthy       appearing mucosa.      The retroflexed view of the distal rectum and anal verge was normal and       showed no anal or rectal abnormalities. Impression:            - Diverticulosis in the left colon.                        - Patent end-to-side ileo-colonic anastomosis,                         characterized by healthy appearing mucosa.                        - The distal rectum and anal verge are normal on                         retroflexion view.                        - No specimens collected. Recommendation:        - Discharge patient to home (with escort).                        - Resume previous diet today.                        - Continue present medications.                        - Repeat colonoscopy in 5 years with 2 day prep for                         surveillance. Procedure Code(s):     ---  Professional ---                        N6295, Colorectal cancer screening; colonoscopy on                         individual at high risk Diagnosis Code(s):     --- Professional ---                        Z86.010, Personal history of colonic polyps                        Z98.0, Intestinal bypass and anastomosis status                        K57.30, Diverticulosis of large intestine without                         perforation or abscess without bleeding CPT copyright 2022 American Medical Association. All rights reserved. The codes documented in this report are preliminary and upon coder review may  be revised to meet current compliance requirements. Dr. Libby Maw Toney Reil MD, MD 11/24/2023 9:33:15 AM This report has been signed electronically. Number of Addenda: 0 Note Initiated On: 11/24/2023 9:10 AM Scope Withdrawal Time: 0 hours 8  minutes 51 seconds  Total Procedure Duration: 0 hours 14 minutes 4 seconds  Estimated Blood Loss:  Estimated blood loss: none.      [facility]

## 2023-11-24 NOTE — Telephone Encounter (Signed)
Pharmacy Patient Advocate Encounter  Received notification from [facility] that Prior Authorization for  Farxiga 5MG  tablets  has been DENIED.  Full denial letter will be uploaded to the media tab. See denial reason below.        [place] #/Case ID/Reference #: [id number]    **staring new encounter for patients primary insurance and for the generic instead of brand**

## 2023-11-24 NOTE — Anesthesia Postprocedure Evaluation (Signed)
Anesthesia Post Note  Patient: Luke [name]  Procedure(s) Performed: COLONOSCOPY WITH PROPOFOL  Patient location during evaluation: Endoscopy Anesthesia Type: General Level of consciousness: awake and alert Pain management: pain level controlled Vital Signs Assessment: post-procedure vital signs reviewed and stable Respiratory status: spontaneous breathing, nonlabored ventilation, respiratory function stable and patient connected to nasal cannula oxygen Cardiovascular status: blood pressure returned to baseline and stable Postop Assessment: no apparent nausea or vomiting Anesthetic complications: no   No notable events documented.   Last Vitals:  Vitals:   11/24/23 0944 11/24/23 0954  BP: (!) 139/96 139/78  Pulse: 65   Resp: 18   Temp:    SpO2: 100%     Last Pain:  Vitals:   11/24/23 0944  TempSrc:   PainSc: 0-No pain                 Corinda Gubler

## 2023-11-24 NOTE — Transfer of Care (Signed)
Immediate Anesthesia Transfer of Care Note  Patient: Luke [name]  Procedure(s) Performed: COLONOSCOPY WITH PROPOFOL  Patient Location: PACU  Anesthesia Type:General  Level of Consciousness: awake, alert , and oriented  Airway & Oxygen Therapy: Patient Spontanous Breathing  Post-op Assessment: Report given to RN and Post -op Vital signs reviewed and stable  Post vital signs: Reviewed and stable  Last Vitals:  Vitals Value Taken Time  BP 116/87 11/24/23 0934  Temp 35.6 C 11/24/23 0934  Pulse 67 11/24/23 0938  Resp 16 11/24/23 0934  SpO2 100 % 11/24/23 0938  Vitals shown include unfiled device data.  Last Pain:  Vitals:   11/24/23 0934  TempSrc: Temporal  PainSc: Asleep         Complications: No notable events documented.

## 2023-11-24 NOTE — Anesthesia Preprocedure Evaluation (Signed)
Anesthesia Evaluation  Patient identified by MRN, date of birth, ID band Patient awake    Reviewed: Allergy & Precautions, NPO status , Patient's Chart, lab work & pertinent test results  History of Anesthesia Complications Negative for: history of anesthetic complications  Airway Mallampati: III  TM Distance: >3 FB Neck ROM: Full    Dental no notable dental hx. (+) Teeth Intact   Pulmonary sleep apnea , neg COPD, Patient abstained from smoking.Not current smoker, former smoker   Pulmonary exam normal breath sounds clear to auscultation       Cardiovascular Exercise Tolerance: Good METShypertension, Pt. on medications (-) CAD and (-) Past MI (-) dysrhythmias  Rhythm:Regular Rate:Normal - Systolic murmurs    Neuro/Psych negative neurological ROS  negative psych ROS   GI/Hepatic ,GERD  ,,(+)     (-) substance abuse    Endo/Other  diabetes, Poorly Controlled, Type 2, Insulin Dependent  Denies GI symptoms today  Renal/GU CRFRenal disease     Musculoskeletal   Abdominal   Peds  Hematology   Anesthesia Other Findings Past Medical History: No date: Anemia No date: B12 deficiency 08/15/2018: Cecum mass     Comment:  a.) 4.8 cm pedunculated cecal mass; Bx (+) for villous               adeoma. b.) s/p RIGHT colectomy. No date: Chronic kidney disease No date: Colonic neoplasm No date: Columnar-lined esophagus No date: Diverticulosis of large intestine without diverticulitis No date: Dyslipidemia (high LDL; low HDL) No date: Gastric polyp No date: GERD (gastroesophageal reflux disease) No date: Hyperlipidemia No date: Hyperlipidemia associated with type 2 diabetes mellitus (HCC) No date: Hypertension No date: Peripheral polyneuropathy 09/07/2016: Pneumonia No date: Sleep apnea No date: Stomach irritation No date: Type 2 diabetes mellitus treated with insulin (HCC) No date: Unilateral inguinal hernia, without  obstruction or gangrene,  not specified as recurrent  Reproductive/Obstetrics                             Anesthesia Physical Anesthesia Plan  ASA: 3  Anesthesia Plan: General   Post-op Pain Management: Minimal or no pain anticipated   Induction: Intravenous  PONV Risk Score and Plan: 2 and Propofol infusion, TIVA and Ondansetron  Airway Management Planned: Nasal Cannula  Additional Equipment: None  Intra-op Plan:   Post-operative Plan:   Informed Consent: I have reviewed the patients History and Physical, chart, labs and discussed the procedure including the risks, benefits and alternatives for the proposed anesthesia with the patient or authorized representative who has indicated his/her understanding and acceptance.     Dental advisory given  Plan Discussed with: CRNA and Surgeon  Anesthesia Plan Comments: (Discussed risks of anesthesia with patient, including possibility of difficulty with spontaneous ventilation under anesthesia necessitating airway intervention, PONV, and rare risks such as cardiac or respiratory or neurological events, and allergic reactions. Discussed the role of CRNA in patient's perioperative care. Patient understands.)       Anesthesia Quick Evaluation

## 2023-11-24 NOTE — Telephone Encounter (Signed)
Medicaid said they needed a denial letter from primary insurance, [address]. We received the denial letter from [facility] and I have renewed the previously denied prior auth in [facility]). I have attached the denial letter for medicaid to review. I have also attached the denial letter to media tab on patients chart.

## 2023-11-25 ENCOUNTER — Other Ambulatory Visit: Payer: Self-pay

## 2023-11-25 ENCOUNTER — Encounter: Payer: Self-pay | Admitting: Gastroenterology

## 2023-11-25 ENCOUNTER — Telehealth: Payer: Self-pay

## 2023-11-25 NOTE — Telephone Encounter (Signed)
Called [facility] community pharmacy to let them know to [employer] for the [place] and they said Monia Pouch states the Patient needs a PA. PA code is [id number].

## 2023-11-26 ENCOUNTER — Other Ambulatory Visit: Payer: Self-pay

## 2023-11-26 ENCOUNTER — Other Ambulatory Visit (HOSPITAL_COMMUNITY): Payer: Self-pay

## 2023-11-26 ENCOUNTER — Encounter: Payer: Self-pay | Admitting: Hematology and Oncology

## 2023-11-26 ENCOUNTER — Other Ambulatory Visit: Payer: Self-pay | Admitting: Nurse Practitioner

## 2023-11-26 DIAGNOSIS — E1165 Type 2 diabetes mellitus with hyperglycemia: Secondary | ICD-10-CM

## 2023-11-26 MED ORDER — EMPAGLIFLOZIN 10 MG PO TABS
10.0000 mg | ORAL_TABLET | Freq: Every morning | ORAL | 1 refills | Status: AC
  Filled 2023-11-26 – 2023-12-01 (×2): qty 30, 30d supply, fill #0

## 2023-11-26 NOTE — Telephone Encounter (Signed)
PA request has been Denied. New Encounter created for follow up. For additional info see Pharmacy Prior Auth telephone encounter from 11/24/23.

## 2023-11-26 NOTE — Telephone Encounter (Signed)
Please call pt and infirm him that farxiga is not covered by the insurance. Have sent Jardiance to the pharmacy.

## 2023-11-26 NOTE — Telephone Encounter (Signed)
Pharmacy Patient Advocate Encounter  Received notification from [facility] that Prior Authorization for Dapagliflozin Propanediol 5MG  tablets  has been DENIED.  See denial reason below. No denial letter attached in CMM. Will attach denial letter to Media tab once received.   [place] #/Case ID/Reference #: [id number]   DENIAL REASON: Patient must try [name]

## 2023-11-26 NOTE — Telephone Encounter (Signed)
Patient is aware that [name] has been called in due to the [place] not being covered by his Insurance.

## 2023-11-29 ENCOUNTER — Telehealth: Payer: Self-pay

## 2023-11-29 ENCOUNTER — Other Ambulatory Visit: Payer: Self-pay

## 2023-11-29 MED FILL — Pravastatin Sodium Tab 80 MG: ORAL | 30 days supply | Qty: 30 | Fill #2 | Status: AC

## 2023-11-29 MED FILL — Gabapentin Cap 300 MG: ORAL | 30 days supply | Qty: 30 | Fill #2 | Status: AC

## 2023-11-29 NOTE — Telephone Encounter (Signed)
ATC X1. Unable to lvm due to not being set up. I received a letter from [facility] for the pt stating the voiding ofd cpap orders due to multiple no shows. I tried to call pt to inform him that he needs to keep upcoming appointment with Waynetta Sandy, NP so we may resend order.

## 2023-11-29 NOTE — Progress Notes (Signed)
Care Guide Pharmacy Note  11/29/2023 Name: Luke [name] MRN: [id number] DOB: 02-Apr-1981  Referred By: Kara Dies, NP Reason for referral: Care Coordination (TNM Diabetes. )   Luke [name] is a 43 y.o. year old male who is a primary care patient of Kara Dies, NP.  Luke [name] was referred to the pharmacist for assistance related to: DMII  An unsuccessful telephone outreach was attempted today to contact the patient who was referred to the pharmacy team for assistance with diabetes. Additional attempts will be made to contact the patient.  [facility]  [facility], [facility] Care Management Assistant Direct Dial: [phone]  Fax: [phone]

## 2023-11-30 ENCOUNTER — Telehealth: Payer: Self-pay

## 2023-11-30 ENCOUNTER — Other Ambulatory Visit (HOSPITAL_COMMUNITY): Payer: Self-pay

## 2023-11-30 NOTE — Telephone Encounter (Signed)
Pharmacy Patient Advocate Encounter   Received notification from  [facility] Portal that prior authorization for Jardiance 10MG  tablets is required/requested.   Insurance verification completed.   The patient is insured through [facility] .   Per test claim: PA required; PA submitted to above mentioned insurance via CoverMyMeds Key/confirmation #/EOC  [id number] Status is pending

## 2023-12-01 ENCOUNTER — Encounter: Payer: Self-pay | Admitting: Hematology and Oncology

## 2023-12-01 ENCOUNTER — Other Ambulatory Visit (HOSPITAL_COMMUNITY): Payer: Self-pay

## 2023-12-01 ENCOUNTER — Other Ambulatory Visit: Payer: Self-pay

## 2023-12-01 NOTE — Telephone Encounter (Signed)
Pharmacy Patient Advocate Encounter   Received notification from CoverMyMeds that prior authorization for Jardiance 10 mg tablets is required/requested.   Insurance verification completed.   The patient is insured through [facility] .   Per test claim: PA required; PA submitted to above mentioned insurance via CoverMyMeds Key/confirmation #/EOC [id number] Status is pending  *Prior Berkley Harvey has been approved under [facility] but he has dual insurance, Medicaid is his secondary.

## 2023-12-02 ENCOUNTER — Telehealth: Payer: Self-pay

## 2023-12-02 ENCOUNTER — Other Ambulatory Visit: Payer: Self-pay

## 2023-12-02 ENCOUNTER — Other Ambulatory Visit: Payer: Self-pay | Admitting: Nurse Practitioner

## 2023-12-02 DIAGNOSIS — E1140 Type 2 diabetes mellitus with diabetic neuropathy, unspecified: Secondary | ICD-10-CM

## 2023-12-02 MED ORDER — INSULIN LISPRO (1 UNIT DIAL) 100 UNIT/ML (KWIKPEN)
10.0000 [IU] | PEN_INJECTOR | Freq: Three times a day (TID) | SUBCUTANEOUS | 2 refills | Status: DC
  Filled 2023-12-02: qty 9, 30d supply, fill #0
  Filled 2024-01-03: qty 9, 30d supply, fill #1
  Filled 2024-02-07: qty 9, 30d supply, fill #2

## 2023-12-02 NOTE — Progress Notes (Signed)
Care Guide Pharmacy Note  12/02/2023 Name: [name] MRN: [id number] DOB: 1981/04/20  Referred By: Kara Dies, NP Reason for referral: Care Coordination (TNM Diabetes. )   Luke [name] is a 43 y.o. year old male who is a primary care patient of Kara Dies, NP.  Trever VONZELL LINDBLAD was referred to the pharmacist for assistance related to: DMII  Successful contact was made with the patient to discuss pharmacy services.  Patient declines engagement at this time. Contact information was provided to the patient should they wish to reach out for assistance at a later time.  [facility]  [facility], [facility] Health Care Management Assistant Direct Dial: [phone]  Fax: [phone]

## 2023-12-08 ENCOUNTER — Other Ambulatory Visit (HOSPITAL_COMMUNITY): Payer: Self-pay

## 2023-12-13 ENCOUNTER — Encounter: Payer: Self-pay | Admitting: Hematology and Oncology

## 2023-12-13 ENCOUNTER — Other Ambulatory Visit (HOSPITAL_COMMUNITY): Payer: Self-pay

## 2023-12-15 ENCOUNTER — Telehealth: Payer: Self-pay

## 2023-12-15 ENCOUNTER — Telehealth: Payer: Self-pay | Admitting: Nurse Practitioner

## 2023-12-15 NOTE — Telephone Encounter (Signed)
Patient states his sugars have been running right around 130 or the lower 130's. The Patient states the Pharmacist was supposed to get in touch with you regarding the continuous glucose monitor. Patient is scheduled for a follow up on 2/21.

## 2023-12-15 NOTE — Telephone Encounter (Signed)
Patient was identified as falling into the [place] Measure - Diabetes.   Patient was: Appointment scheduled with primary care provider in the next 30 days.

## 2023-12-16 NOTE — Telephone Encounter (Signed)
Please call the pharmacy and check regarding CGM.

## 2023-12-17 ENCOUNTER — Other Ambulatory Visit: Payer: Self-pay

## 2023-12-17 NOTE — Telephone Encounter (Signed)
Pharmacy states Patient needs a PA for [facility]

## 2023-12-20 ENCOUNTER — Ambulatory Visit (INDEPENDENT_AMBULATORY_CARE_PROVIDER_SITE_OTHER): Payer: Medicaid Other | Admitting: Surgery

## 2023-12-20 ENCOUNTER — Encounter: Payer: Self-pay | Admitting: Surgery

## 2023-12-20 ENCOUNTER — Other Ambulatory Visit (HOSPITAL_COMMUNITY): Payer: Self-pay

## 2023-12-20 VITALS — BP 142/95 | HR 62 | Temp 98.3°F | Ht 68.0 in | Wt 200.8 lb

## 2023-12-20 DIAGNOSIS — R1030 Lower abdominal pain, unspecified: Secondary | ICD-10-CM | POA: Diagnosis not present

## 2023-12-20 NOTE — Patient Instructions (Signed)
We have scheduled you for a CT Scan of your Abdomen and Pelvis with contrast. This has been scheduled at [facility] on [address] on 12/29/2023 . Please arrive there by 1:45 pm . If you need to reschedule your Scan, you may do so by calling [phone]. Please let us know if you reschedule your scan as we have to get authorization from your insurance for this.    Belly Hernia (Ventral Hernia): What to Know  A ventral hernia is a bulge of tissue from inside the belly that pushes through a weak area of the belly. Sometimes, the bulge may have tissue from the small intestine or the large intestine. Ventral hernias do not go away without surgery. There are several types of ventral hernias. You may have: A hernia at a place where surgery was done (incisional hernia). A hernia just above the belly button (epigastric or paraumbilical hernia) or at the belly button (umbilical hernia). These can happen because of heavy lifting or straining. A hernia that comes and goes (reducible hernia). It may be visible only when you lift or strain. This type of hernia can be pushed back into the belly. A hernia that traps belly tissue inside the hernia (incarcerated hernia). This hernia cannot be pushed back into the belly. A hernia that cuts off blood flow to the tissues inside the hernia (strangulation hernia). The tissues can start to die if this happens. This is a very painful bulge that cannot be pushed back into the belly. This type of hernia is a medical emergency. What are the causes? This condition happens when tissue in the belly pushes on a weak area in the muscles. What increases the risk? You're more likely to have this condition if: You are 60 years or older. You had belly surgery in the past. This is common if there was an infection after surgery. You had an injury to the belly. You often lift or push heavy objects. You have been pregnant several times. You have long-term (chronic)  health conditions that put pressure in your belly. These include: Being overweight or obese. Having a buildup of fluid inside your belly (ascites). You throw up or cough over and over again. You have trouble pooping (constipation). You strain to poop or pee. What are the signs or symptoms? The only symptom of a ventral hernia may be a painless bulge in the belly.  Reducible hernia may be visible only when you strain, cough, or lift. Other symptoms may include: Dull pain. A feeling of pressure. Symptoms of an incarcerated hernia may include: Tenderness at hernia site. Bloating. Throwing up or feeling like you may throw up. Trouble pooping or no pooping at all. Symptoms of a strangulated hernia may include: More pain. Throwing up or feeling like you may throw up. Pain when pressing on the hernia. The skin over the hernia turning red or purple. Trouble pooping. Blood in the poop. How is this diagnosed? This condition may be diagnosed based on: Your symptoms and medical history. A physical exam. You may be asked to cough or strain while standing. These actions increase the pressure inside your belly and force the hernia through the opening in your belly. Your health care provider may try to reduce the hernia by gently pushing the hernia back in. Imaging studies, such as an ultrasound or CT scan. How is this treated? This condition is treated with surgery. If you have a strangulated hernia, surgery is done as soon as possible. If your hernia  is small and not incarcerated, you may be asked to lose some weight before surgery. Follow these instructions at home: Eat and drink only as you've been told. Lose weight, if told by your provider. You may have to avoid lifting. Ask your provider how much you can safely lift. Avoid activities that increase pressure on your hernia. Take your medicines only as told. You may need to take steps to help treat or prevent trouble pooping (constipation),  such as: Taking medicines to help you poop. Eating foods high in fiber, like beans, whole grains, and fresh fruits and vegetables. Drinking more fluids as told. Ask your provider if it's safe to drive or use machines while taking your medicine. Contact a health care provider if: Your hernia gets larger or feels hard. Your hernia becomes painful. Get help right away if: Your hernia becomes very painful. You have pain along with any of these: Changes in skin color in the area of the hernia. Feeling like throwing up. Throwing up. Fever. These symptoms may be an emergency. Call 911 right away. Do not wait to see if the symptoms will go away. Do not drive yourself to the hospital. This information is not intended to replace advice given to you by your health care provider. Make sure you discuss any questions you have with your health care provider. Document Revised: 04/27/2023 Document Reviewed: 04/27/2023 Elsevier Patient Education  2024 [employer].

## 2023-12-20 NOTE — Telephone Encounter (Signed)
Called Patient and let him know this message and gave him the phone number to call and get this straightened out.

## 2023-12-20 NOTE — Telephone Encounter (Signed)
Pharmacy Patient Advocate Encounter  Received notification from [facility] that Prior Authorization for Jardiance 10MG  tablets has been DENIED.  See denial reason below. No denial letter attached in CMM. Will attach denial letter to Media tab once received.   PA #/Case ID/Reference #: [id number]  *Please verify with patient that he has no other coverage other than Medicaid. When we do a test claim the [employer] comes back as terminated. Medicaid will not do the prior auth until the patient calls and sorts that out. The number he needs to call is listed below.

## 2023-12-22 ENCOUNTER — Other Ambulatory Visit (HOSPITAL_COMMUNITY): Payer: Self-pay

## 2023-12-22 ENCOUNTER — Telehealth: Payer: Self-pay

## 2023-12-22 NOTE — Telephone Encounter (Signed)
I know he is scheduled to see [facility] tomorrow. He needs to be rescheduled. We dont have compliance reports, the pt never set his cpap up.

## 2023-12-22 NOTE — Telephone Encounter (Signed)
I called [facility] to see if our office could be tagged in [place] so we can print compliance reports. I was informed that the pt has scheduled and no showed multiple appointments to have the CPAP set up. Pt may need to be rescheduled until a CPAP is set up with dme company. Forwarding to Buelah Manis, NP so she is aware and front office

## 2023-12-22 NOTE — Telephone Encounter (Signed)
 Patient has been rescheduled.

## 2023-12-23 ENCOUNTER — Ambulatory Visit: Payer: 59 | Admitting: Primary Care

## 2023-12-24 ENCOUNTER — Ambulatory Visit: Payer: Medicaid Other | Admitting: Nurse Practitioner

## 2023-12-24 ENCOUNTER — Encounter: Payer: Self-pay | Admitting: Nurse Practitioner

## 2023-12-24 VITALS — BP 122/80 | HR 97 | Temp 98.7°F | Ht 68.0 in | Wt 203.0 lb

## 2023-12-24 DIAGNOSIS — Z794 Long term (current) use of insulin: Secondary | ICD-10-CM

## 2023-12-24 DIAGNOSIS — E1140 Type 2 diabetes mellitus with diabetic neuropathy, unspecified: Secondary | ICD-10-CM

## 2023-12-24 NOTE — Progress Notes (Signed)
Outpatient Surgical Follow Up    Luke [name] is an 43 y.o. male.   Chief Complaint  Patient presents with   Follow-up    Ventral hernia 08/17/23    HPI: 43 year old male with prior history of ventral hernia repair 4 months ago now comes seen due to some concern about some pain around incision.  No fevers no chills.  No evidence of obstruction.  He reports the pain is mild intermittent and sharp.  No specific alligation aggravating factors.  He just wants [place] to check him for potential recurrence  Past Medical History:  Diagnosis Date   Anemia    B12 deficiency    Cecum mass 08/15/2018   a.) 4.8 cm pedunculated cecal mass; Bx (+) for villous adeoma. b.) s/p RIGHT colectomy.   Chronic kidney disease    Colonic neoplasm    Columnar-lined esophagus    Diverticulosis of large intestine without diverticulitis    Dyslipidemia (high LDL; low HDL)    Gastric polyp    GERD (gastroesophageal reflux disease)    Hyperlipidemia    Hyperlipidemia associated with type 2 diabetes mellitus (HCC)    Hypertension    Peripheral polyneuropathy    Pneumonia 09/07/2016   Sleep apnea    Stomach irritation    Type 2 diabetes mellitus treated with insulin (HCC)    Unilateral inguinal hernia, without obstruction or gangrene, not specified as recurrent     Past Surgical History:  Procedure Laterality Date   COLON SURGERY     COLONOSCOPY WITH PROPOFOL N/A 07/28/2018   Procedure: COLONOSCOPY WITH PROPOFOL;  Surgeon: Pasty Spillers, MD;  Location: [facility] ENDOSCOPY;  Service: Endoscopy;  Laterality: N/A;   COLONOSCOPY WITH PROPOFOL N/A 10/18/2019   Procedure: COLONOSCOPY WITH PROPOFOL;  Surgeon: Pasty Spillers, MD;  Location: [facility] ENDOSCOPY;  Service: Endoscopy;  Laterality: N/A;   COLONOSCOPY WITH PROPOFOL N/A 02/17/2021   Procedure: COLONOSCOPY WITH PROPOFOL;  Surgeon: Pasty Spillers, MD;  Location: [facility] ENDOSCOPY;  Service: Endoscopy;  Laterality: N/A;   COLONOSCOPY WITH PROPOFOL  N/A 11/24/2023   Procedure: COLONOSCOPY WITH PROPOFOL;  Surgeon: Toney Reil, MD;  Location: [facility] ENDOSCOPY;  Service: Gastroenterology;  Laterality: N/A;   ESOPHAGOGASTRODUODENOSCOPY (EGD) WITH PROPOFOL N/A 07/28/2018   Procedure: ESOPHAGOGASTRODUODENOSCOPY (EGD) WITH PROPOFOL;  Surgeon: Pasty Spillers, MD;  Location: [facility] ENDOSCOPY;  Service: Endoscopy;  Laterality: N/A;   PARTIAL COLECTOMY Right 08/15/2018   Procedure: RIGHT  COLECTOMY;  Surgeon: Ancil Linsey, MD;  Location: [facility] ORS;  Service: General;  Laterality: Right;   VENTRAL HERNIA REPAIR N/A 08/17/2023   Procedure: HERNIA REPAIR VENTRAL ADULT, open;  Surgeon: Leafy Ro, MD;  Location: [facility] ORS;  Service: General;  Laterality: N/A;   XI ROBOTIC ASSISTED INGUINAL HERNIA REPAIR WITH MESH Right 01/07/2023   Procedure: XI ROBOTIC ASSISTED INGUINAL HERNIA REPAIR WITH MESH, poss Bilateral;  Surgeon: Leafy Ro, MD;  Location: [facility] ORS;  Service: General;  Laterality: Right;  Possible Bilateral    Family History  Problem Relation Age of Onset   Heart disease Mother    Diabetes Mother    Cancer Mother    Breast cancer Mother    Hypertension Mother    Arthritis Father    Hypertension Father    Colon cancer Paternal Uncle    CAD Maternal Grandmother    Breast cancer Maternal Grandmother    Throat cancer Maternal Grandfather    Diabetes Paternal Grandmother     Social History:  reports that he  quit smoking about 3 years ago. His smoking use included cigarettes. He has been exposed to tobacco smoke. He has never used smokeless tobacco. He reports current alcohol use of about 2.0 standard drinks of alcohol per week. He reports that he does not currently use drugs after having used the following drugs: Marijuana.  Allergies: No Known Allergies  Medications reviewed.    ROS Full ROS performed and is otherwise negative other than what is stated in HPI   BP (!) 142/95   Pulse 62   Temp 98.3 F (36.8 C)  (Oral)   Ht 5\' 8"  (1.727 m)   Wt 200 lb 12.8 oz (91.1 kg)   SpO2 98%   BMI 30.53 kg/m   Physical Exam Vitals and nursing note reviewed. Exam conducted with a chaperone present.  Constitutional:      General: He is not in acute distress.    Appearance: Normal appearance.  Pulmonary:     Effort: Pulmonary effort is normal. No respiratory distress.     Breath sounds: Normal breath sounds. No stridor.  Abdominal:     General: Abdomen is flat. There is no distension.     Palpations: Abdomen is soft. There is no mass.     Tenderness: There is no abdominal tenderness. There is no rebound.     Hernia: No hernia is present.     Comments: Prior incisions w/o infection no hernia recurrence on PE  Skin:    General: Skin is warm.     Capillary Refill: Capillary refill takes less than 2 seconds.  Neurological:     General: No focal deficit present.     Mental Status: He is alert and oriented to person, place, and time.  Psychiatric:        Mood and Affect: Mood normal.        Behavior: Behavior normal.        Thought Content: Thought content normal.        Judgment: Judgment normal.      Assessment/Plan: SOme peri incisional pain w/o PE of recurrence however given limitation of exam I would like to get further imagine. We will do CT and see him in a few weeks. No need for surgical intervention  Sterling Big, MD [facility] General Surgeon

## 2023-12-24 NOTE — Progress Notes (Signed)
 Established Patient Office Visit  Subjective:  Patient ID: Luke [name], male    DOB: [birth year]  Age: 43 y.o. MRN: [id number]  CC:  Chief Complaint  Patient presents with   Diabetes   Discussed the use of a AI scribe software for clinical note transcription with the patient, who gave verbal consent to proceed.   HPI  Luke [name] is a 43 year old male presents for follow-up on diabetes.  Patient states that he has been experiencing issues obtaining his medication.  He is experiencing difficulties obtaining his prescribed medications Jardiance and CGM he has tried sample of [place]  but experienced weakness as a side effect and has not been able to obtain the full prescription.  His blood sugar levels are generally under control, with fasting blood sugars ranging between 120 and 130 mg/dL. He takes Lantus  45 units at night and sliding scale Humalog.  His previous hemoglobin A1c. Lab Results  Component Value Date   HGBA1C 12.0 (H) 10/05/2023   He has been more physically active, walking in his neighborhood and using stairs more frequently, which he finds beneficial for his overall health. He has not been feeling tired.   HPI   Past Medical History:  Diagnosis Date   Anemia    B12 deficiency    Cecum mass 08/15/2018   a.) 4.8 cm pedunculated cecal mass; Bx (+) for villous adeoma. b.) s/p RIGHT colectomy.   Chronic kidney disease    Colonic neoplasm    Columnar-lined esophagus    Diverticulosis of large intestine without diverticulitis    Dyslipidemia (high LDL; low HDL)    Gastric polyp    GERD (gastroesophageal reflux disease)    Hyperlipidemia    Hyperlipidemia associated with type 2 diabetes mellitus (HCC)    Hypertension    Peripheral polyneuropathy    Pneumonia 09/07/2016   Sleep apnea    Stomach irritation    Type 2 diabetes mellitus treated with insulin (HCC)    Unilateral inguinal hernia, without obstruction or gangrene, not specified as recurrent      Past Surgical History:  Procedure Laterality Date   COLON SURGERY     COLONOSCOPY WITH PROPOFOL N/A 07/28/2018   Procedure: COLONOSCOPY WITH PROPOFOL;  Surgeon: Pasty Spillers, MD;  Location: [facility] ENDOSCOPY;  Service: Endoscopy;  Laterality: N/A;   COLONOSCOPY WITH PROPOFOL N/A 10/18/2019   Procedure: COLONOSCOPY WITH PROPOFOL;  Surgeon: Pasty Spillers, MD;  Location: [facility] ENDOSCOPY;  Service: Endoscopy;  Laterality: N/A;   COLONOSCOPY WITH PROPOFOL N/A 02/17/2021   Procedure: COLONOSCOPY WITH PROPOFOL;  Surgeon: Pasty Spillers, MD;  Location: [facility] ENDOSCOPY;  Service: Endoscopy;  Laterality: N/A;   COLONOSCOPY WITH PROPOFOL N/A 11/24/2023   Procedure: COLONOSCOPY WITH PROPOFOL;  Surgeon: Toney Reil, MD;  Location: [facility] ENDOSCOPY;  Service: Gastroenterology;  Laterality: N/A;   ESOPHAGOGASTRODUODENOSCOPY (EGD) WITH PROPOFOL N/A 07/28/2018   Procedure: ESOPHAGOGASTRODUODENOSCOPY (EGD) WITH PROPOFOL;  Surgeon: Pasty Spillers, MD;  Location: [facility] ENDOSCOPY;  Service: Endoscopy;  Laterality: N/A;   PARTIAL COLECTOMY Right 08/15/2018   Procedure: RIGHT  COLECTOMY;  Surgeon: Ancil Linsey, MD;  Location: [facility] ORS;  Service: General;  Laterality: Right;   VENTRAL HERNIA REPAIR N/A 08/17/2023   Procedure: HERNIA REPAIR VENTRAL ADULT, open;  Surgeon: Leafy Ro, MD;  Location: [facility] ORS;  Service: General;  Laterality: N/A;   XI ROBOTIC ASSISTED INGUINAL HERNIA REPAIR WITH MESH Right 01/07/2023   Procedure: XI ROBOTIC ASSISTED INGUINAL HERNIA REPAIR WITH  MESH, poss Bilateral;  Surgeon: Leafy Ro, MD;  Location: [facility] ORS;  Service: General;  Laterality: Right;  Possible Bilateral    Family History  Problem Relation Age of Onset   Heart disease Mother    Diabetes Mother    Cancer Mother    Breast cancer Mother    Hypertension Mother    Arthritis Father    Hypertension Father    Colon cancer Paternal Uncle    CAD Maternal Grandmother    Breast cancer  Maternal Grandmother    Throat cancer Maternal Grandfather    Diabetes Paternal Grandmother     Social History   Socioeconomic History   Marital status: Single    Spouse name: Not on file   Number of children: Not on file   Years of education: 12   Highest education level: Not on file  Occupational History    Comment: [employer]  Tobacco Use   Smoking status: Former    Current packs/day: 0.00    Types: Cigarettes    Quit date: 03/25/2020    Years since quitting: 3.7    Passive exposure: Past   Smokeless tobacco: Never   Tobacco comments:    patient is social smoker/ smokes when he drinks   Vaping Use   Vaping status: Never Used  Substance and Sexual Activity   Alcohol use: Yes    Alcohol/week: 2.0 standard drinks of alcohol    Types: 2 Shots of liquor per week   Drug use: Not Currently    Types: Marijuana    Comment: last use 2020   Sexual activity: Not Currently    Birth control/protection: Condom  Other Topics Concern   Not on file  Social History Narrative   Not on file   Social Drivers of Health   Financial Resource Strain: Not on file  Food Insecurity: No Food Insecurity (04/15/2022)   Hunger Vital Sign    Worried About Running Out of Food in the Last Year: Never true    Ran Out of Food in the Last Year: Never true  Transportation Needs: No Transportation Needs (04/15/2022)   PRAPARE - Administrator, Civil Service (Medical): No    Lack of Transportation (Non-Medical): No  Physical Activity: Not on file  Stress: Not on file  Social Connections: Not on file  Intimate Partner Violence: Not on file     Outpatient Medications Prior to Visit  Medication Sig Dispense Refill   amLODipine (NORVASC) 5 MG tablet Take 1 tablet (5 mg total) by mouth daily. 90 tablet 3   blood glucose meter kit and supplies KIT Dispense based on patient and insurance preference. Use up to four times daily as directed. (FOR ICD-9 250.00, 250.01). 1 each 0   Continuous  Glucose Sensor (DEXCOM G6 SENSOR) MISC Change the sensor every 10 days 1 each 3   Continuous Glucose Sensor (FREESTYLE LIBRE 3 SENSOR) MISC Place 1 sensor on the skin every 14 days. Use to check glucose continuously 2 each 5   empagliflozin (JARDIANCE) 10 MG TABS tablet Take 1 tablet (10 mg total) by mouth every morning. 30 tablet 1   fenofibrate 54 MG tablet Take 1 tablet (54 mg total) by mouth daily. 90 tablet 1   gabapentin (NEURONTIN) 300 MG capsule Take 1 capsule (300 mg total) by mouth at bedtime. 30 capsule 2   HYDROcodone-acetaminophen (NORCO/VICODIN) 5-325 MG tablet Take 1-2 tablets by mouth every 4 (four) hours as needed for moderate pain (pain score  4-6). 20 tablet 0   HYDROcodone-acetaminophen (NORCO/VICODIN) 5-325 MG tablet Take 1-2 tablets by mouth every 4 (four) hours as needed for moderate pain (pain score 4-6) 20 tablet 0   insulin glargine (LANTUS) 100 unit/mL SOPN Inject 40 Units into the skin at bedtime. 15 mL 11   insulin lispro (HUMALOG) 100 UNIT/ML KwikPen Inject 10 Units into the skin 3 (three) times daily. 9 mL 2   Insulin Pen Needle (NOVOFINE PEN NEEDLE) 32G X 6 MM MISC use as directed 100 each 98   Insulin Pen Needle 32G X 4 MM MISC USE AS DIRECTED WITH INSULIN. 100 each PRN   pravastatin (PRAVACHOL) 80 MG tablet Take 1 tablet (80 mg total) by mouth daily. 30 tablet 2   No facility-administered medications prior to visit.    No Known Allergies  ROS Review of Systems Negative unless indicated in HPI.    Objective:    Physical Exam  BP 122/80   Pulse 97   Temp 98.7 F (37.1 C)   Ht 5\' 8"  (1.727 m)   Wt 203 lb (92.1 kg)   SpO2 97%   BMI 30.87 kg/m  Wt Readings from Last 3 Encounters:  12/24/23 203 lb (92.1 kg)  12/20/23 200 lb 12.8 oz (91.1 kg)  11/24/23 203 lb (92.1 kg)     Health Maintenance  Topic Date Due   Pneumococcal Vaccine 20-44 Years old (1 of 2 - PCV) Never done   Hepatitis C Screening  Never done   DTaP/Tdap/Td (1 - Tdap) Never done    OPHTHALMOLOGY EXAM  01/31/2022   COVID-19 Vaccine (1) 01/09/2024 (Originally 12/27/1985)   INFLUENZA VACCINE  01/31/2024 (Originally 06/03/2023)   Diabetic kidney evaluation - Urine ACR  03/25/2024   HEMOGLOBIN A1C  04/04/2024   Diabetic kidney evaluation - eGFR measurement  10/04/2024   FOOT EXAM  11/03/2024   Colonoscopy  11/23/2028   HIV Screening  Completed   HPV VACCINES  Aged Out    There are no preventive care reminders to display for this patient.  Lab Results  Component Value Date   TSH 0.891 02/28/2020   Lab Results  Component Value Date   WBC 6.2 10/05/2023   HGB 16.0 10/05/2023   HCT 50.3 10/05/2023   MCV 87.4 10/05/2023   PLT 191.0 10/05/2023   Lab Results  Component Value Date   NA 140 10/05/2023   K 3.7 10/05/2023   CO2 29 10/05/2023   GLUCOSE 204 (H) 10/05/2023   BUN 7 10/05/2023   CREATININE 1.21 10/05/2023   BILITOT 0.7 10/05/2023   ALKPHOS 94 10/05/2023   AST 19 10/05/2023   ALT 19 10/05/2023   PROT 7.4 10/05/2023   ALBUMIN 4.3 10/05/2023   CALCIUM 9.7 10/05/2023   ANIONGAP 12 07/27/2023   EGFR 88 03/19/2021   GFR 73.20 10/05/2023   Lab Results  Component Value Date   CHOL 171 10/05/2023   Lab Results  Component Value Date   HDL 32.30 (L) 10/05/2023   Lab Results  Component Value Date   LDLCALC 71 09/10/2021   Lab Results  Component Value Date   TRIG (H) 10/05/2023    427.0 Triglyceride is over 400; calculations on Lipids are invalid.   Lab Results  Component Value Date   CHOLHDL 5 10/05/2023   Lab Results  Component Value Date   HGBA1C 12.0 (H) 10/05/2023      Assessment & Plan:  Type 2 diabetes mellitus with diabetic neuropathy, unspecified whether long term insulin use (  HCC) Assessment & Plan: Fasting blood sugars between 120-130. Patient is taking insulin as directed with adjustments based on blood sugar readings. Last A1C was high. -Continue current insulin regimen. -Check A1C in 3 weeks. -Contact pharmacy to  prescription issues.  Orders: -     Hemoglobin A1c; Future    Follow-up: Return in about 2 weeks (around 01/07/2024) for non fasting lab .   Kara Dies, NP

## 2023-12-24 NOTE — Telephone Encounter (Signed)
 PA request has been Approved. New Encounter created for follow up. For additional info see Pharmacy Prior Auth telephone encounter from 11/23/2023.

## 2023-12-27 ENCOUNTER — Encounter: Payer: Self-pay | Admitting: Hematology and Oncology

## 2023-12-29 ENCOUNTER — Ambulatory Visit: Admission: RE | Admit: 2023-12-29 | Payer: Medicaid Other | Source: Ambulatory Visit

## 2023-12-30 ENCOUNTER — Ambulatory Visit: Payer: 59 | Admitting: Dietician

## 2024-01-03 ENCOUNTER — Other Ambulatory Visit: Payer: Self-pay

## 2024-01-03 ENCOUNTER — Other Ambulatory Visit: Payer: Self-pay | Admitting: Nurse Practitioner

## 2024-01-03 ENCOUNTER — Encounter: Payer: Self-pay | Admitting: Hematology and Oncology

## 2024-01-03 ENCOUNTER — Other Ambulatory Visit (HOSPITAL_COMMUNITY): Payer: Self-pay

## 2024-01-03 DIAGNOSIS — E785 Hyperlipidemia, unspecified: Secondary | ICD-10-CM

## 2024-01-03 MED ORDER — PRAVASTATIN SODIUM 80 MG PO TABS
80.0000 mg | ORAL_TABLET | Freq: Every day | ORAL | 1 refills | Status: DC
  Filled 2024-01-03: qty 90, 90d supply, fill #0
  Filled 2024-03-31: qty 90, 90d supply, fill #1

## 2024-01-03 NOTE — Assessment & Plan Note (Signed)
 Fasting blood sugars between 120-130. Patient is taking insulin as directed with adjustments based on blood sugar readings. Last A1C was high. -Continue current insulin regimen. -Check A1C in 3 weeks. -Contact pharmacy to prescription issues.

## 2024-01-04 ENCOUNTER — Telehealth: Payer: Self-pay

## 2024-01-04 NOTE — Telephone Encounter (Signed)
 Patient was identified as falling into the [place] Measure - Diabetes.   Patient was: Appointment scheduled with primary care provider in the next 30 days.    Pt seen by provider 12/24/23 and has lab appointment schedule for 01/07/24 for A1C.

## 2024-01-05 ENCOUNTER — Other Ambulatory Visit: Payer: Self-pay

## 2024-01-05 ENCOUNTER — Ambulatory Visit: Payer: Medicaid Other | Admitting: Surgery

## 2024-01-07 ENCOUNTER — Encounter: Payer: Self-pay | Admitting: Pharmacist

## 2024-01-07 ENCOUNTER — Other Ambulatory Visit: Payer: Medicaid Other

## 2024-01-07 NOTE — Progress Notes (Signed)
 Patient was identified as falling into the [place] Measure - Diabetes.     Patient has declined pharmacist visit.  Last A1c: 12%on 10/05/23 Next A1c Due: 01/03/24 Next Scheduled Visit: LAB visit (A1c) scheduled 01/07/24 AM - No Show  Called patient regarding missed lab appointment. No answer, voicemail is not set up, could not leave message.  MyChart Message sent.

## 2024-01-11 ENCOUNTER — Ambulatory Visit
Admission: RE | Admit: 2024-01-11 | Discharge: 2024-01-11 | Disposition: A | Discharge status: Home / Self Care | Source: Ambulatory Visit | Attending: Surgery | Admitting: Surgery

## 2024-01-11 DIAGNOSIS — K439 Ventral hernia without obstruction or gangrene: Secondary | ICD-10-CM | POA: Diagnosis not present

## 2024-01-11 DIAGNOSIS — R1030 Lower abdominal pain, unspecified: Secondary | ICD-10-CM | POA: Diagnosis not present

## 2024-01-11 DIAGNOSIS — K5730 Diverticulosis of large intestine without perforation or abscess without bleeding: Secondary | ICD-10-CM | POA: Diagnosis not present

## 2024-01-11 LAB — POCT I-STAT CREATININE: Creatinine, Ser: 1.2 mg/dL (ref 0.61–1.24)

## 2024-01-11 MED ORDER — IOHEXOL 9 MG/ML PO SOLN
500.0000 mL | ORAL | Status: AC
  Administered 2024-01-11: 500 mL via ORAL

## 2024-01-11 MED ORDER — IOHEXOL 300 MG/ML  SOLN
100.0000 mL | Freq: Once | INTRAMUSCULAR | Status: AC | PRN
  Administered 2024-01-11: 100 mL via INTRAVENOUS

## 2024-01-22 ENCOUNTER — Other Ambulatory Visit: Payer: Self-pay | Admitting: Nurse Practitioner

## 2024-01-22 DIAGNOSIS — E1140 Type 2 diabetes mellitus with diabetic neuropathy, unspecified: Secondary | ICD-10-CM

## 2024-01-24 ENCOUNTER — Encounter: Payer: Self-pay | Admitting: Surgery

## 2024-01-24 ENCOUNTER — Ambulatory Visit (INDEPENDENT_AMBULATORY_CARE_PROVIDER_SITE_OTHER): Admitting: Surgery

## 2024-01-24 VITALS — BP 125/85 | HR 67 | Temp 98.0°F | Ht 68.0 in | Wt 201.0 lb

## 2024-01-24 DIAGNOSIS — R1013 Epigastric pain: Secondary | ICD-10-CM | POA: Diagnosis not present

## 2024-01-24 DIAGNOSIS — G8929 Other chronic pain: Secondary | ICD-10-CM

## 2024-01-24 NOTE — Patient Instructions (Signed)
   Follow-up with our office as needed.  Please call and ask to speak with a nurse if you develop questions or concerns.

## 2024-01-25 ENCOUNTER — Other Ambulatory Visit: Payer: Self-pay | Admitting: Nurse Practitioner

## 2024-01-25 ENCOUNTER — Other Ambulatory Visit: Payer: Self-pay

## 2024-01-25 DIAGNOSIS — E1140 Type 2 diabetes mellitus with diabetic neuropathy, unspecified: Secondary | ICD-10-CM

## 2024-01-25 NOTE — Telephone Encounter (Signed)
 Patient is scheduled for 01/27/24 at 8:00.

## 2024-01-26 ENCOUNTER — Other Ambulatory Visit: Payer: Self-pay

## 2024-01-26 NOTE — Progress Notes (Addendum)
 Outpatient Surgical Follow Up    Luke [name] is an 43 y.o. male.   Chief Complaint  Patient presents with   Follow-up    HPI: 43 year old male with prior history of ventral hernia repair 5 months ago comes for f/u regarding some intermittent abd pain..  No fevers no chills.  No evidence of obstruction.  He reports the pain is mild intermittent and sharp.  No specific alligation aggravating factors.  He just wants [place] to check him for potential recurrence. He states that the pain is actually getting better He did underwent a CT scan personally reviewed showing no evidence of hernia recurrences.  No other acute intra-abdominal normalities.  I have discussed the results with the  Past Medical History:  Diagnosis Date   Anemia    B12 deficiency    Cecum mass 08/15/2018   a.) 4.8 cm pedunculated cecal mass; Bx (+) for villous adeoma. b.) s/p RIGHT colectomy.   Chronic kidney disease    Colonic neoplasm    Columnar-lined esophagus    Diverticulosis of large intestine without diverticulitis    Dyslipidemia (high LDL; low HDL)    Gastric polyp    GERD (gastroesophageal reflux disease)    Hyperlipidemia    Hyperlipidemia associated with type 2 diabetes mellitus (HCC)    Hypertension    Peripheral polyneuropathy    Pneumonia 09/07/2016   Sleep apnea    Stomach irritation    Type 2 diabetes mellitus treated with insulin (HCC)    Unilateral inguinal hernia, without obstruction or gangrene, not specified as recurrent     Past Surgical History:  Procedure Laterality Date   COLON SURGERY     COLONOSCOPY WITH PROPOFOL N/A 07/28/2018   Procedure: COLONOSCOPY WITH PROPOFOL;  Surgeon: Pasty Spillers, MD;  Location: [facility];  Service: Endoscopy;  Laterality: N/A;   COLONOSCOPY WITH PROPOFOL N/A 10/18/2019   Procedure: COLONOSCOPY WITH PROPOFOL;  Surgeon: Pasty Spillers, MD;  Location: [facility];  Service: Endoscopy;  Laterality: N/A;   COLONOSCOPY WITH PROPOFOL N/A  02/17/2021   Procedure: COLONOSCOPY WITH PROPOFOL;  Surgeon: Pasty Spillers, MD;  Location: [facility];  Service: Endoscopy;  Laterality: N/A;   COLONOSCOPY WITH PROPOFOL N/A 11/24/2023   Procedure: COLONOSCOPY WITH PROPOFOL;  Surgeon: Toney Reil, MD;  Location: [facility];  Service: Gastroenterology;  Laterality: N/A;   ESOPHAGOGASTRODUODENOSCOPY (EGD) WITH PROPOFOL N/A 07/28/2018   Procedure: ESOPHAGOGASTRODUODENOSCOPY (EGD) WITH PROPOFOL;  Surgeon: Pasty Spillers, MD;  Location: [facility];  Service: Endoscopy;  Laterality: N/A;   PARTIAL COLECTOMY Right 08/15/2018   Procedure: RIGHT  COLECTOMY;  Surgeon: Ancil Linsey, MD;  Location: [facility];  Service: General;  Laterality: Right;   VENTRAL HERNIA REPAIR N/A 08/17/2023   Procedure: HERNIA REPAIR VENTRAL ADULT, open;  Surgeon: Leafy Ro, MD;  Location: [facility];  Service: General;  Laterality: N/A;   XI ROBOTIC ASSISTED INGUINAL HERNIA REPAIR WITH MESH Right 01/07/2023   Procedure: XI ROBOTIC ASSISTED INGUINAL HERNIA REPAIR WITH MESH, poss Bilateral;  Surgeon: Leafy Ro, MD;  Location: [facility];  Service: General;  Laterality: Right;  Possible Bilateral    Family History  Problem Relation Age of Onset   Heart disease Mother    Diabetes Mother    Cancer Mother    Breast cancer Mother    Hypertension Mother    Arthritis Father    Hypertension Father    Colon cancer Paternal Uncle    CAD Maternal Grandmother  Breast cancer Maternal Grandmother    Throat cancer Maternal Grandfather    Diabetes Paternal Grandmother     Social History:  reports that he quit smoking about 3 years ago. His smoking use included cigarettes. He has been exposed to tobacco smoke. He has never used smokeless tobacco. He reports current alcohol use of about 2.0 standard drinks of alcohol per week. He reports that he does not currently use drugs after having used the following drugs: Marijuana.  Allergies: No Known  Allergies  Medications reviewed.    ROS Full ROS performed and is otherwise negative other than what is stated in HPI   BP 125/85   Pulse 67   Temp 98 F (36.7 C)   Ht 5\' 8"  (1.727 m)   Wt 201 lb (91.2 kg)   SpO2 99%   BMI 30.56 kg/m   Physical Exam  Physical Exam Vitals and nursing note reviewed. Exam conducted with a chaperone present.  Constitutional:      General: He is not in acute distress.    Appearance: Normal appearance.  Pulmonary:     Effort: Pulmonary effort is normal. No respiratory distress.     Breath sounds: Normal breath sounds. No stridor.  Abdominal:     General: Abdomen is flat. There is no distension.     Palpations: Abdomen is soft. There is no mass.     Tenderness: There is no abdominal tenderness. There is no rebound.     Hernia: No hernia is present.     Comments: Prior incisions w/o infection no hernia recurrence on PE  Skin:    General: Skin is warm.     Capillary Refill: Capillary refill takes less than 2 seconds.  Neurological:     General: No focal deficit present.     Mental Status: He is alert and oriented to person, place, and time.  Psychiatric:        Mood and Affect: Mood normal.        Behavior: Behavior normal.        Thought Content: Thought content normal.        Judgment: Judgment normal.          Assessment/Plan: 43 year old male with multiple abdominal operations and now with some functional abd pain, no evidence of hernia recurrence or any infection on physical exam or CT scan no need for surgical intervention.  Discussed with the patient in detail about findings and about my recommendations.  For now we will see him on a as needed basis.      Sterling Big, MD [facility] General Surgeon

## 2024-01-27 ENCOUNTER — Other Ambulatory Visit: Payer: Self-pay

## 2024-01-27 ENCOUNTER — Encounter: Payer: Self-pay | Admitting: Hematology and Oncology

## 2024-01-27 ENCOUNTER — Telehealth: Payer: Self-pay

## 2024-01-27 ENCOUNTER — Ambulatory Visit: Admitting: Nurse Practitioner

## 2024-01-27 MED FILL — Gabapentin Cap 300 MG: ORAL | 30 days supply | Qty: 30 | Fill #0 | Status: AC

## 2024-01-27 NOTE — Telephone Encounter (Signed)
 Attempted to call Patient due to him missing his appointment this morning for A1c and nerve pain. No answer and voicemail has not been set up yet.

## 2024-01-28 ENCOUNTER — Other Ambulatory Visit: Payer: Self-pay

## 2024-02-02 ENCOUNTER — Telehealth: Payer: Self-pay

## 2024-02-02 NOTE — Telephone Encounter (Signed)
 Pt has an appointment to see Luke Manis, NP tomorrow ( 02-03-24) at 8:30 am. Pt does not show up in [facility] for CPAP compliance. I tried calling [facility] regarding this and I could not reach anyone. I left a vm asking for a call back from [facility].   I tried calling pt as well, and could not reach him. I was unable to lvm since it has not been set up yet.   The pt was rescheduled from 12/2023 because his CPAP was never set up. ( See telephone encounter from [phone] and 12-22-23)   I asked [name], CMA what th best approach would be since this would be the 3rd encounter and because [facility] can not reach him either. It was stated to have a letter sen out to pt that we can not continue his care if our office and the DME company can not reach him, and request that the pt contact both Pulmonary and [facility].   Letter has been made and ready to be mailed. NFN

## 2024-02-03 ENCOUNTER — Ambulatory Visit: Payer: 59 | Admitting: Primary Care

## 2024-02-07 ENCOUNTER — Other Ambulatory Visit: Payer: Self-pay

## 2024-02-08 ENCOUNTER — Ambulatory Visit: Admitting: Sleep Medicine

## 2024-02-08 ENCOUNTER — Encounter: Payer: Self-pay | Admitting: Sleep Medicine

## 2024-02-08 VITALS — BP 128/92 | HR 70 | Temp 97.7°F | Ht 68.0 in | Wt 207.0 lb

## 2024-02-08 DIAGNOSIS — I10 Essential (primary) hypertension: Secondary | ICD-10-CM | POA: Diagnosis not present

## 2024-02-08 DIAGNOSIS — Z6831 Body mass index (BMI) 31.0-31.9, adult: Secondary | ICD-10-CM

## 2024-02-08 DIAGNOSIS — E66811 Obesity, class 1: Secondary | ICD-10-CM

## 2024-02-08 DIAGNOSIS — G4733 Obstructive sleep apnea (adult) (pediatric): Secondary | ICD-10-CM | POA: Diagnosis not present

## 2024-02-08 DIAGNOSIS — E6609 Other obesity due to excess calories: Secondary | ICD-10-CM

## 2024-02-08 NOTE — Progress Notes (Signed)
 Name:Luke [name] MRN: [id number] DOB: 06-22-1981   CHIEF COMPLAINT:  PSG F/U   HISTORY OF PRESENT ILLNESS:  Luke [name] is a 43 y.o. w/ a h/o HTN, DMII, hyperlipidemia and obesity who presents to follow up on PSG results. The patient underwent PSG which revealed severe OSA (AHI 95, O2 79%).      EPWORTH SLEEP SCORE 22    02/08/2024    8:40 AM 08/09/2023    1:00 PM  Results of the Epworth flowsheet  Sitting and reading 3 3  Watching TV 3 3  Sitting, inactive in a public place (e.g. a theatre or a meeting) 2 3  As a passenger in a car for an hour without a break 3 3  Lying down to rest in the afternoon when circumstances permit 3 3  Sitting and talking to someone 3 1  Sitting quietly after a lunch without alcohol 3 3  In a car, while stopped for a few minutes in traffic 2 0  Total score 22 19     PAST MEDICAL HISTORY :   has a past medical history of Anemia, B12 deficiency, Cecum mass (08/15/2018), Chronic kidney disease, Colonic neoplasm, Columnar-lined esophagus, Diverticulosis of large intestine without diverticulitis, Dyslipidemia (high LDL; low HDL), Gastric polyp, GERD (gastroesophageal reflux disease), Hyperlipidemia, Hyperlipidemia associated with type 2 diabetes mellitus (HCC), Hypertension, Peripheral polyneuropathy, Pneumonia (09/07/2016), Sleep apnea, Stomach irritation, Type 2 diabetes mellitus treated with insulin (HCC), and Unilateral inguinal hernia, without obstruction or gangrene, not specified as recurrent.  has a past surgical history that includes Colonoscopy with propofol (N/A, 07/28/2018); Esophagogastroduodenoscopy (egd) with propofol (N/A, 07/28/2018); Partial colectomy (Right, 08/15/2018); Colon surgery; Colonoscopy with propofol (N/A, 10/18/2019); Colonoscopy with propofol (N/A, 02/17/2021); XI Robotic assisted inguinal hernia repair with mesh (Right, 01/07/2023); Ventral hernia repair (N/A, 08/17/2023); and Colonoscopy with propofol (N/A,  11/24/2023). Prior to Admission medications   Medication Sig Start Date End Date Taking? Authorizing Provider  blood glucose meter kit and supplies KIT Dispense based on patient and insurance preference. Use up to four times daily as directed. (FOR ICD-9 250.00, 250.01). 04/04/20  Yes Iloabachie, Chioma E, NP  empagliflozin (JARDIANCE) 10 MG TABS tablet Take 1 tablet (10 mg total) by mouth every morning. 11/26/23  Yes Kara Dies, NP  fenofibrate 54 MG tablet Take 1 tablet (54 mg total) by mouth daily. 10/07/23  Yes Kara Dies, NP  gabapentin (NEURONTIN) 300 MG capsule Take 1 capsule (300 mg total) by mouth at bedtime. 01/27/24 04/26/24 Yes Kara Dies, NP  insulin glargine (LANTUS) 100 unit/mL SOPN Inject 40 Units into the skin at bedtime. 11/04/23  Yes Kara Dies, NP  insulin lispro (HUMALOG) 100 UNIT/ML KwikPen Inject 10 Units into the skin 3 (three) times daily. 12/02/23  Yes Kara Dies, NP  Insulin Pen Needle (NOVOFINE PEN NEEDLE) 32G X 6 MM MISC use as directed 01/27/22  Yes Skipper Cliche, RPH  Insulin Pen Needle 32G X 4 MM MISC USE AS DIRECTED WITH INSULIN. 01/27/22  Yes Romeo Apple, Keri K, RPH  pravastatin (PRAVACHOL) 80 MG tablet Take 1 tablet (80 mg total) by mouth daily. 01/03/24 04/04/24 Yes Kara Dies, NP  amLODipine (NORVASC) 5 MG tablet Take 1 tablet (5 mg total) by mouth daily. 07/26/23 02/04/24  Kara Dies, NP  Continuous Glucose Sensor (DEXCOM G6 SENSOR) MISC Change the sensor every 10 days Patient not taking: Reported on 02/08/2024 10/19/23   Kara Dies, NP  Continuous Glucose Sensor (FREESTYLE LIBRE 3 SENSOR) MISC  Place 1 sensor on the skin every 14 days. Use to check glucose continuously Patient not taking: Reported on 02/08/2024 10/28/23   Kara Dies, NP  HYDROcodone-acetaminophen (NORCO/VICODIN) 5-325 MG tablet Take 1-2 tablets by mouth every 4 (four) hours as needed for moderate pain (pain score 4-6). Patient not taking: Reported on  02/08/2024 08/17/23   Leafy Ro, MD  HYDROcodone-acetaminophen (NORCO/VICODIN) 5-325 MG tablet Take 1-2 tablets by mouth every 4 (four) hours as needed for moderate pain (pain score 4-6) Patient not taking: Reported on 02/08/2024 08/17/23   Leafy Ro, MD  metFORMIN (GLUCOPHAGE) 850 MG tablet Take 1 tablet (850 mg total) by mouth 2 (two) times daily with a meal. 12/18/20 12/19/20  Iloabachie, Chioma E, NP   No Known Allergies  FAMILY HISTORY:  family history includes Arthritis in his father; Breast cancer in his maternal grandmother and mother; CAD in his maternal grandmother; Cancer in his mother; Colon cancer in his paternal uncle; Diabetes in his mother and paternal grandmother; Heart disease in his mother; Hypertension in his father and mother; Throat cancer in his maternal grandfather. SOCIAL HISTORY:  reports that he quit smoking about 3 years ago. His smoking use included cigarettes. He has been exposed to tobacco smoke. He has never used smokeless tobacco. He reports current alcohol use of about 2.0 standard drinks of alcohol per week. He reports that he does not currently use drugs after having used the following drugs: Marijuana.   Review of Systems:  Gen:  Denies  fever, sweats, chills weight loss  HEENT: Denies blurred vision, double vision, ear pain, eye pain, hearing loss, nose bleeds, sore throat Cardiac:  No dizziness, chest pain or heaviness, chest tightness,edema, No JVD Resp:   No cough, -sputum production, -shortness of breath,-wheezing, -hemoptysis,  Gi: Denies swallowing difficulty, stomach pain, nausea or vomiting, diarrhea, constipation, bowel incontinence Gu:  Denies bladder incontinence, burning urine Ext:   Denies Joint pain, stiffness or swelling Skin: Denies  skin rash, easy bruising or bleeding or hives Endoc:  Denies polyuria, polydipsia , polyphagia or weight change Psych:   Denies depression, insomnia or hallucinations  Other:  All other systems  negative  VITAL SIGNS: BP (!) 128/92 (BP Location: Left Arm, Patient Position: Sitting, Cuff Size: Normal)   Pulse 70   Temp 97.7 F (36.5 C) (Temporal)   Ht 5\' 8"  (1.727 m)   Wt 207 lb (93.9 kg)   SpO2 98%   BMI 31.47 kg/m    Physical Examination:   General Appearance: No distress  EYES PERRLA, EOM intact.   NECK Supple, No JVD Pulmonary: normal breath sounds, No wheezing.  CardiovascularNormal S1,S2.  No m/r/g.   Abdomen: Benign, Soft, non-tender. Skin:   warm, no rashes, no ecchymosis  Extremities: normal, no cyanosis, clubbing. Neuro:without focal findings,  speech normal  PSYCHIATRIC: Mood, affect within normal limits.   ASSESSMENT AND PLAN  OSA Reviewed PSG results with patient. Starting on APAP therapy set to 4-20 cm H2O. Discussed the consequences of untreated sleep apnea. Advised not to drive drowsy for safety of patient and others. Will follow up in 3 months to review CPAP efficacy and compliance data.  HTN BP elevated, advised patient to follow up with PCP for further management.   Obesity Counseled patient on diet and lifestyle modification.    Patient  satisfied with Plan of action and management. All questions answered  Follow up in 3 months to review CPAP efficacy and compliance data.   I spent a total  of 31 minutes reviewing chart data, face-to-face evaluation with the patient, counseling and coordination of care as detailed above.    Tempie Hoist, M.D.  Sleep Medicine [facility]

## 2024-02-08 NOTE — Patient Instructions (Signed)

## 2024-02-09 ENCOUNTER — Telehealth: Payer: Self-pay

## 2024-02-09 NOTE — Telephone Encounter (Signed)
 Attempted to call Patient on both phone numbers to schedule a follow up appointment with an A1c check per Kara Dies- no answer and voicemail has not been set up yet.

## 2024-02-09 NOTE — Telephone Encounter (Signed)
 Called Patient again to schedule phone went straight to voicemail that says has not been set up yet.

## 2024-02-28 ENCOUNTER — Other Ambulatory Visit: Payer: Self-pay

## 2024-02-28 ENCOUNTER — Encounter: Payer: Self-pay | Admitting: Hematology and Oncology

## 2024-02-28 MED FILL — Gabapentin Cap 300 MG: ORAL | 30 days supply | Qty: 30 | Fill #1 | Status: AC

## 2024-03-06 DIAGNOSIS — G4733 Obstructive sleep apnea (adult) (pediatric): Secondary | ICD-10-CM | POA: Diagnosis not present

## 2024-03-31 ENCOUNTER — Other Ambulatory Visit: Payer: Self-pay

## 2024-03-31 ENCOUNTER — Other Ambulatory Visit: Payer: Self-pay | Admitting: Nurse Practitioner

## 2024-03-31 DIAGNOSIS — E1140 Type 2 diabetes mellitus with diabetic neuropathy, unspecified: Secondary | ICD-10-CM

## 2024-03-31 MED ORDER — INSULIN LISPRO (1 UNIT DIAL) 100 UNIT/ML (KWIKPEN)
10.0000 [IU] | PEN_INJECTOR | Freq: Three times a day (TID) | SUBCUTANEOUS | 2 refills | Status: DC
Start: 2024-03-31 — End: 2024-07-07
  Filled 2024-03-31: qty 9, 30d supply, fill #0
  Filled 2024-05-08: qty 9, 30d supply, fill #1
  Filled 2024-06-08: qty 9, 30d supply, fill #2

## 2024-03-31 MED FILL — Gabapentin Cap 300 MG: ORAL | 30 days supply | Qty: 30 | Fill #2 | Status: AC

## 2024-04-06 DIAGNOSIS — G4733 Obstructive sleep apnea (adult) (pediatric): Secondary | ICD-10-CM | POA: Diagnosis not present

## 2024-05-06 DIAGNOSIS — G4733 Obstructive sleep apnea (adult) (pediatric): Secondary | ICD-10-CM | POA: Diagnosis not present

## 2024-05-08 ENCOUNTER — Other Ambulatory Visit: Payer: Self-pay

## 2024-05-08 ENCOUNTER — Other Ambulatory Visit: Payer: Self-pay | Admitting: Nurse Practitioner

## 2024-05-08 DIAGNOSIS — E1140 Type 2 diabetes mellitus with diabetic neuropathy, unspecified: Secondary | ICD-10-CM

## 2024-05-09 ENCOUNTER — Encounter: Payer: Self-pay | Admitting: Sleep Medicine

## 2024-05-09 ENCOUNTER — Other Ambulatory Visit: Payer: Self-pay

## 2024-05-09 ENCOUNTER — Ambulatory Visit (INDEPENDENT_AMBULATORY_CARE_PROVIDER_SITE_OTHER): Admitting: Sleep Medicine

## 2024-05-09 VITALS — BP 120/88 | HR 81 | Temp 99.0°F | Ht 68.0 in | Wt 208.6 lb

## 2024-05-09 DIAGNOSIS — R079 Chest pain, unspecified: Secondary | ICD-10-CM

## 2024-05-09 DIAGNOSIS — E6609 Other obesity due to excess calories: Secondary | ICD-10-CM | POA: Diagnosis not present

## 2024-05-09 DIAGNOSIS — Z6831 Body mass index (BMI) 31.0-31.9, adult: Secondary | ICD-10-CM | POA: Diagnosis not present

## 2024-05-09 DIAGNOSIS — I10 Essential (primary) hypertension: Secondary | ICD-10-CM

## 2024-05-09 DIAGNOSIS — E66811 Obesity, class 1: Secondary | ICD-10-CM

## 2024-05-09 DIAGNOSIS — G4733 Obstructive sleep apnea (adult) (pediatric): Secondary | ICD-10-CM | POA: Diagnosis not present

## 2024-05-09 DIAGNOSIS — Z87891 Personal history of nicotine dependence: Secondary | ICD-10-CM | POA: Diagnosis not present

## 2024-05-09 NOTE — Progress Notes (Signed)
 Name:Luke [name] MRN: [id number] DOB: 01-21-1981   CHIEF COMPLAINT:  CPAP F/U   HISTORY OF PRESENT ILLNESS:  Luke [name] is a 43 y.o. w/ a h/o OSA, HTN, DMII, hyperlipidemia and obesity who presents for CPAP F/U visit. Reports difficulty using CPAP therapy due to dry mouth. Reports also having chest pain at night while using CPAP and has difficulty falling back to sleep. Reports that he is currently using the Air[facility] P10 nasal mask which is uncomfortable.    EPWORTH SLEEP SCORE    02/08/2024    8:40 AM 08/09/2023    1:00 PM  Results of the Epworth flowsheet  Sitting and reading 3 3  Watching TV 3 3  Sitting, inactive in a public place (e.g. a theatre or a meeting) 2 3  As a passenger in a car for an hour without a break 3 3  Lying down to rest in the afternoon when circumstances permit 3 3  Sitting and talking to someone 3 1  Sitting quietly after a lunch without alcohol 3 3  In a car, while stopped for a few minutes in traffic 2 0  Total score 22 19     PAST MEDICAL HISTORY :   has a past medical history of Anemia, B12 deficiency, Cecum mass (08/15/2018), Chronic kidney disease, Colonic neoplasm, Columnar-lined esophagus, Diverticulosis of large intestine without diverticulitis, Dyslipidemia (high LDL; low HDL), Gastric polyp, GERD (gastroesophageal reflux disease), Hyperlipidemia, Hyperlipidemia associated with type 2 diabetes mellitus (HCC), Hypertension, Peripheral polyneuropathy, Pneumonia (09/07/2016), Sleep apnea, Stomach irritation, Type 2 diabetes mellitus treated with insulin  (HCC), and Unilateral inguinal hernia, without obstruction or gangrene, not specified as recurrent.  has a past surgical history that includes Colonoscopy with propofol  (N/A, 07/28/2018); Esophagogastroduodenoscopy (egd) with propofol  (N/A, 07/28/2018); Partial colectomy (Right, 08/15/2018); Colon surgery; Colonoscopy with propofol  (N/A, 10/18/2019); Colonoscopy with propofol  (N/A, 02/17/2021); XI  Robotic assisted inguinal hernia repair with mesh (Right, 01/07/2023); Ventral hernia repair (N/A, 08/17/2023); and Colonoscopy with propofol  (N/A, 11/24/2023). Prior to Admission medications   Medication Sig Start Date End Date Taking? Authorizing Provider  amLODipine  (NORVASC ) 5 MG tablet Take 1 tablet (5 mg total) by mouth daily. 07/26/23 06/08/24 Yes Vincente Saber, NP  blood glucose meter kit and supplies KIT Dispense based on patient and insurance preference. Use up to four times daily as directed. (FOR ICD-9 250.00, 250.01). 04/04/20  Yes Iloabachie, Chioma E, NP  empagliflozin  (JARDIANCE ) 10 MG TABS tablet Take 1 tablet (10 mg total) by mouth every morning. 11/26/23  Yes Vincente Saber, NP  fenofibrate  54 MG tablet Take 1 tablet (54 mg total) by mouth daily. 10/07/23  Yes Kaur, Charanpreet, NP  gabapentin  (NEURONTIN ) 300 MG capsule Take 1 capsule (300 mg total) by mouth at bedtime. 01/27/24 05/09/24 Yes Kaur, Charanpreet, NP  insulin  glargine (LANTUS ) 100 unit/mL SOPN Inject 40 Units into the skin at bedtime. 11/04/23  Yes Vincente Saber, NP  insulin  lispro (HUMALOG ) 100 UNIT/ML KwikPen Inject 10 Units into the skin 3 (three) times daily. 03/31/24  Yes Vincente Saber, NP  Insulin  Pen Needle (NOVOFINE PEN NEEDLE) 32G X 6 MM MISC use as directed 01/27/22  Yes Margrette Levorn POUR, Luke [name]  Insulin  Pen Needle 32G X 4 MM MISC USE AS DIRECTED WITH INSULIN . 01/27/22  Yes Margrette Levorn POUR, RPH  pravastatin  (PRAVACHOL ) 80 MG tablet Take 1 tablet (80 mg total) by mouth daily. 01/03/24 07/03/24 Yes Vincente Saber, NP  Continuous Glucose Sensor (DEXCOM G6 SENSOR) MISC Change the sensor  every 10 days Patient not taking: Reported on 05/09/2024 10/19/23   Vincente Saber, NP  Continuous Glucose Sensor (FREESTYLE LIBRE 3 SENSOR) MISC Place 1 sensor on the skin every 14 days. Use to check glucose continuously Patient not taking: Reported on 05/09/2024 10/28/23   Kaur, Charanpreet, NP  HYDROcodone -acetaminophen   (NORCO/VICODIN) 5-325 MG tablet Take 1-2 tablets by mouth every 4 (four) hours as needed for moderate pain (pain score 4-6). Patient not taking: Reported on 05/09/2024 08/17/23   Jordis Laneta FALCON, MD  HYDROcodone -acetaminophen  (NORCO/VICODIN) 5-325 MG tablet Take 1-2 tablets by mouth every 4 (four) hours as needed for moderate pain (pain score 4-6) Patient not taking: Reported on 05/09/2024 08/17/23   Jordis Laneta FALCON, MD  metFORMIN  (GLUCOPHAGE ) 850 MG tablet Take 1 tablet (850 mg total) by mouth 2 (two) times daily with a meal. 12/18/20 12/19/20  Iloabachie, Chioma E, NP   No Known Allergies  FAMILY HISTORY:  family history includes Arthritis in his father; Breast cancer in his maternal grandmother and mother; CAD in his maternal grandmother; Cancer in his mother; Colon cancer in his paternal uncle; Diabetes in his mother and paternal grandmother; Heart disease in his mother; Hypertension in his father and mother; Throat cancer in his maternal grandfather. SOCIAL HISTORY:  reports that he quit smoking about 4 years ago. His smoking use included cigarettes. He has been exposed to tobacco smoke. He has never used smokeless tobacco. He reports current alcohol use of about 2.0 standard drinks of alcohol per week. He reports that he does not currently use drugs after having used the following drugs: Marijuana.   Review of Systems:  Gen:  Denies  fever, sweats, chills weight loss  HEENT: Denies blurred vision, double vision, ear pain, eye pain, hearing loss, nose bleeds, sore throat Cardiac:  No dizziness, chest pain or heaviness, chest tightness,edema, No JVD Resp:   No cough, -sputum production, -shortness of breath,-wheezing, -hemoptysis,  Gi: Denies swallowing difficulty, stomach pain, nausea or vomiting, diarrhea, constipation, bowel incontinence Gu:  Denies bladder incontinence, burning urine Ext:   Denies Joint pain, stiffness or swelling Skin: Denies  skin rash, easy bruising or bleeding or  hives Endoc:  Denies polyuria, polydipsia , polyphagia or weight change Psych:   Denies depression, insomnia or hallucinations  Other:  All other systems negative  VITAL SIGNS: BP 120/88 (BP Location: Right Arm, Patient Position: Sitting, Cuff Size: Large)   Pulse 81   Temp 99 F (37.2 C) (Oral)   Ht 5' 8 (1.727 m)   Wt 208 lb 9.6 oz (94.6 kg)   SpO2 97%   BMI 31.72 kg/m    Physical Examination:   General Appearance: No distress  EYES PERRLA, EOM intact.   NECK Supple, No JVD Pulmonary: normal breath sounds, No wheezing.  CardiovascularNormal S1,S2.  No m/r/g.   Abdomen: Benign, Soft, non-tender. Skin:   warm, no rashes, no ecchymosis  Extremities: normal, no cyanosis, clubbing. Neuro:without focal findings,  speech normal  PSYCHIATRIC: Mood, affect within normal limits.   ASSESSMENT AND PLAN  OSA Counseled patient on increasing CPAP compliance significantly. For mask discomfort, will try patient on the [facility] F30i FFM. Also adjusted pressure setting and humidity level in office. Discussed the consequences of untreated sleep apnea. Advised not to drive drowsy for safety of patient and others. Will follow up in 6 weeks.    Nocturnal chest pain Patient denies chest pain currently, will refer to cardiology for further evaluation. Advised patient to seek immediate medical attention if chest  pain occurs again.   HTN Stable, on current management. Following with PCP.   Obesity Counseled patient on diet and lifestyle modification.    Patient  satisfied with Plan of action and management. All questions answered  I spent a total of 20 minutes reviewing chart data, face-to-face evaluation with the patient, counseling and coordination of care as detailed above.    Jarod Bozzo, M.D.  Sleep Medicine [facility]

## 2024-05-09 NOTE — Patient Instructions (Signed)

## 2024-06-06 DIAGNOSIS — G4733 Obstructive sleep apnea (adult) (pediatric): Secondary | ICD-10-CM | POA: Diagnosis not present

## 2024-06-08 ENCOUNTER — Other Ambulatory Visit: Payer: Self-pay

## 2024-06-08 ENCOUNTER — Encounter: Payer: Self-pay | Admitting: Hematology and Oncology

## 2024-06-12 ENCOUNTER — Other Ambulatory Visit: Payer: Self-pay

## 2024-06-15 ENCOUNTER — Telehealth: Payer: Self-pay | Admitting: *Deleted

## 2024-06-15 NOTE — Progress Notes (Signed)
 Cardiology Office Note   Date:  06/16/2024  ID:  NAYDEN CZAJKA, DOB June 22, 1981, MRN [id number] PCP: Vincente Saber, NP  [facility] Providers Cardiologist:  None     History of Present Illness Simone AZARIUS LAMBSON is a 43 y.o. male with a past medical history of hypertension, acid reflux, diverticulitis of the colon with perforation, type 2 diabetes, peripheral polyneuropathy, iron  deficiency anemia, dyslipidemia, OSA on CPAP, who presents today to establish care.  Patient previously followed with his pulmonologist after reporting difficulty using CPAP therapy due to dry mouth.  He was last seen in clinic 05/09/2024.  During that visit he also reported having difficulty at night and having chest pain with difficulty falling back to sleep while using his CPAP.  He had a family history that includes coronary artery disease in his maternal grandmother, heart disease in his mother, and hypertension in his mother and father.  He also stated that he had quit smoking approximately 4 years ago.  He is smoking included cigarettes but he was exposed to tobacco smoke.  Never used smokeless tobacco.  Reported current alcohol use of about 2 standard drinks of alcohol per week.  He also stated that he had previously used marijuana but was not currently using any drugs.  He was counseled on increasing CPAP compliance significantly for mask discomfort his mask was changed.  For his nocturnal chest discomfort he was referred to cardiology for further evaluation.  He returns to clinic today  ROS: 10 point review of systems has been reviewed and considered negative except ones been listed in the HPI  Studies Reviewed EKG Interpretation Date/Time:  Friday June 16 2024 08:45:45 EDT Ventricular Rate:  69 PR Interval:  182 QRS Duration:  152 QT Interval:  428 QTC Calculation: 458 R Axis:   26  Text Interpretation: Normal sinus rhythm Right bundle branch block When compared with ECG of 27-Jul-2023 09:30,  No significant change since last tracing Confirmed by Gerard Frederick ([id number]) on 06/16/2024 8:52:00 AM    Risk Assessment/Calculations           Physical Exam VS:  BP 138/88 (BP Location: Left Arm, Patient Position: Sitting, Cuff Size: Normal)   Pulse 69   Ht 5' 8 (1.727 m)   Wt 208 lb (94.3 kg)   SpO2 98%   BMI 31.63 kg/m        Wt Readings from Last 3 Encounters:  06/16/24 208 lb (94.3 kg)  05/09/24 208 lb 9.6 oz (94.6 kg)  02/08/24 207 lb (93.9 kg)    GEN: Well nourished, well developed in no acute distress NECK: No JVD; No carotid bruits CARDIAC: RRR, no murmurs, rubs, gallops RESPIRATORY:  Clear to auscultation without rales, wheezing or rhonchi  ABDOMEN: Soft, non-tender, non-distended EXTREMITIES:  No edema; No deformity   ASSESSMENT AND PLAN Precordial/nocturnal chest pain concerning for unstable angina that he says has been ongoing for several years but he started to have chest discomfort with exertion throughout the day as well.  He has several risk factors for ischemic heart disease.  He has been scheduled for a coronary CTA to rule out any ischemic causes of his chest discomfort.  EKG today reveals sinus rhythm with a rate of 69 with a right bundle branch block with no other ischemic changes noted.  He has been continued on pravastatin  as well as fenofibrate .    Shortness of breath has been progressive.  He states that has been ongoing for several months and is gradually  gotten worse.  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  He states he was diagnosed with colon cancer at 46 was told that he had heart failure but had no testing and no escalation of GDMT.  He has been scheduled for an echocardiogram.  Hypertension with a blood pressure of 142/92 and 138/88.  He has had his medications today as he takes amlodipine  5 mg daily.  He is slightly nervous about being in clinic  today.  Further chart review reveals blood pressures running in the 120 systolic and further appointments.  Patient has been encouraged to continue to monitor his blood pressures 1 to 2 hours postmedication administration as well.  Dyslipidemia with an LDL it has been unable to be determined as to elevated triglycerides at 427.  Patient has upcoming lab work with his PCP in the next several weeks.  He has currently continued on fenofibrate  54 mg daily and pravastatin  80 mg daily.  Right bundle branch block noted on EKG that initially.  In September 2024.  Will continue to monitor with surveillance studies.  He has been scheduled for an echocardiogram to evaluate structural abnormalities.  Type 2 diabetes where he is continued on Jardiance , metformin , and insulin  therapy.  Ongoing management per his PCP.  Obstructive sleep apnea with CPAP.  States that he has been having difficulty wearing his mask.  Ongoing management by pulmonary.  Advised on some strategies of becoming comfortable with the mask.  Obesity with a BMI of 31.63.  Would benefit from weight loss.       Dispo: Patient to return to clinic to see MD/APP once testing is completed or sooner if needed  Signed, Zackerie Sara, NP

## 2024-06-15 NOTE — Telephone Encounter (Signed)
 Unable to LVM to verify card hx no VM setup.

## 2024-06-16 ENCOUNTER — Encounter: Payer: Self-pay | Admitting: Cardiology

## 2024-06-16 ENCOUNTER — Other Ambulatory Visit: Payer: Self-pay

## 2024-06-16 ENCOUNTER — Ambulatory Visit: Attending: Cardiology | Admitting: Cardiology

## 2024-06-16 VITALS — BP 138/88 | HR 69 | Ht 68.0 in | Wt 208.0 lb

## 2024-06-16 DIAGNOSIS — Z794 Long term (current) use of insulin: Secondary | ICD-10-CM | POA: Insufficient documentation

## 2024-06-16 DIAGNOSIS — R0602 Shortness of breath: Secondary | ICD-10-CM | POA: Diagnosis not present

## 2024-06-16 DIAGNOSIS — E1140 Type 2 diabetes mellitus with diabetic neuropathy, unspecified: Secondary | ICD-10-CM | POA: Diagnosis not present

## 2024-06-16 DIAGNOSIS — I4510 Unspecified right bundle-branch block: Secondary | ICD-10-CM | POA: Insufficient documentation

## 2024-06-16 DIAGNOSIS — E785 Hyperlipidemia, unspecified: Secondary | ICD-10-CM | POA: Diagnosis not present

## 2024-06-16 DIAGNOSIS — I10 Essential (primary) hypertension: Secondary | ICD-10-CM | POA: Diagnosis not present

## 2024-06-16 DIAGNOSIS — R079 Chest pain, unspecified: Secondary | ICD-10-CM | POA: Diagnosis not present

## 2024-06-16 DIAGNOSIS — G4733 Obstructive sleep apnea (adult) (pediatric): Secondary | ICD-10-CM | POA: Insufficient documentation

## 2024-06-16 DIAGNOSIS — R072 Precordial pain: Secondary | ICD-10-CM | POA: Diagnosis not present

## 2024-06-16 DIAGNOSIS — E66811 Obesity, class 1: Secondary | ICD-10-CM | POA: Diagnosis not present

## 2024-06-16 DIAGNOSIS — Z6831 Body mass index (BMI) 31.0-31.9, adult: Secondary | ICD-10-CM | POA: Insufficient documentation

## 2024-06-16 MED ORDER — METOPROLOL TARTRATE 100 MG PO TABS
100.0000 mg | ORAL_TABLET | Freq: Once | ORAL | 0 refills | Status: DC
  Filled 2024-06-16: qty 1, 1d supply, fill #0

## 2024-06-16 NOTE — Patient Instructions (Signed)
 Medication Instructions:  Your physician recommends that you continue on your current medications as directed. Please refer to the Current Medication list given to you today.   *If you need a refill on your cardiac medications before your next appointment, please call your pharmacy*  Lab Work: Your provider would like for you to have following labs drawn today BMP.   If you have labs (blood work) drawn today and your tests are completely normal, you will receive your results only by: MyChart Message (if you have MyChart) OR A paper copy in the mail If you have any lab test that is abnormal or we need to change your treatment, we will call you to review the results.  Testing/Procedures: Your physician has requested that you have an echocardiogram. Echocardiography is a painless test that uses sound waves to create images of your heart. It provides your doctor with information about the size and shape of your heart and how well your heart's chambers and valves are working.   You may receive an ultrasound enhancing agent through an IV if needed to better visualize your heart during the echo. This procedure takes approximately one hour.  There are no restrictions for this procedure.  This will take place at [address] [facility], [place] [address]  Please note: We ask at that you not bring children with you during ultrasound (echo/ vascular) testing. Due to room size and safety concerns, children are not allowed in the ultrasound rooms during exams. Our front office staff cannot provide observation of children in our lobby area while testing is being conducted. An adult accompanying a patient to their appointment will only be allowed in the ultrasound room at the discretion of the ultrasound technician under special circumstances. We apologize for any inconvenience.     Your cardiac CT will be scheduled at one of the below locations:   [facility] [address] [place], [place] [address] [phone]   If scheduled at [facility], please arrive to the Heart and Vascular Center 15 mins early for check-in and test prep.  There is spacious parking and easy access to the radiology department from the [facility] and V[facility] entrance. Please enter here and check-in with the desk attendant.   Please follow these instructions carefully (unless otherwise directed):  An IV will be required for this test and Nitroglycerin will be given.  Hold all erectile dysfunction medications at least 3 days (72 hrs) prior to test. (Ie viagra, cialis, sildenafil, tadalafil, etc)   On the Night Before the Test: Be sure to Drink plenty of water. Do not consume any caffeinated/decaffeinated beverages or chocolate 12 hours prior to your test. Do not take any antihistamines 12 hours prior to your test.  On the Day of the Test: Drink plenty of water until 1 hour prior to the test. Do not eat any food 1 hour prior to test. You may take your regular medications prior to the test.  Take metoprolol  (Lopressor ) two hours prior to test. If you take Furosemide/Hydrochlorothiazide/Spironolactone/Chlorthalidone, please HOLD on the morning of the test. Patients who wear a continuous glucose monitor MUST remove the device prior to scanning. FEMALES- please wear underwire-free bra if available, avoid dresses & tight clothing       After the Test: Drink plenty of water. After receiving IV contrast, you may experience a mild flushed feeling. This is normal. On occasion, you may experience a mild rash up to 24  hours after the test. This is not dangerous. If this occurs, you can take Benadryl  25 mg, Zyrtec, Claritin, or Allegra and increase your fluid intake. (Patients taking Tikosyn should avoid Benadryl , and may take Zyrtec, Claritin, or Allegra) If you experience trouble breathing, this can be serious. If it is severe call 911  IMMEDIATELY. If it is mild, please call our office.  We will call to schedule your test 2-4 weeks out understanding that some insurance companies will need an authorization prior to the service being performed.   For more information and frequently asked questions, please visit our website : [email or web address]  For non-scheduling related questions, please contact the cardiac imaging nurse navigator should you have any questions/concerns: Cardiac Imaging Nurse Navigators Direct Office Dial : [phone]   For scheduling needs, including cancellations and rescheduling, please call [place], [phone].   Follow-Up: At [facility], you and your health needs are our priority.  As part of our continuing mission to provide you with exceptional heart care, our providers are all part of one team.  This team includes your primary Cardiologist (physician) and Advanced Practice Providers or APPs (Physician Assistants and Nurse Practitioners) who all work together to provide you with the care you need, when you need it.  Your next appointment:   2 month(s)  Provider:   Tylene Lunch, NP    We recommend signing up for the patient portal called MyChart.  Sign up information is provided on this After Visit Summary.  MyChart is used to connect with patients for Virtual Visits (Telemedicine).  Patients are able to view lab/test results, encounter notes, upcoming appointments, etc.  Non-urgent messages can be sent to your provider as well.   To learn more about what you can do with MyChart, go to [email or web address].

## 2024-06-17 LAB — BASIC METABOLIC PANEL WITH GFR
BUN/Creatinine Ratio: 7 — ABNORMAL LOW (ref 9–20)
BUN: 8 mg/dL (ref 6–24)
CO2: 22 mmol/L (ref 20–29)
Calcium: 9.5 mg/dL (ref 8.7–10.2)
Chloride: 103 mmol/L (ref 96–106)
Creatinine, Ser: 1.11 mg/dL (ref 0.76–1.27)
Glucose: 106 mg/dL — ABNORMAL HIGH (ref 70–99)
Potassium: 4.1 mmol/L (ref 3.5–5.2)
Sodium: 140 mmol/L (ref 134–144)
eGFR: 84 mL/min/1.73 (ref >59)

## 2024-06-19 ENCOUNTER — Ambulatory Visit: Payer: Self-pay | Admitting: Cardiology

## 2024-06-19 NOTE — Progress Notes (Signed)
 Kidney function and potassium remain stable. Continue current medication regimen without changes at this time.

## 2024-06-20 ENCOUNTER — Ambulatory Visit: Admitting: Sleep Medicine

## 2024-06-30 ENCOUNTER — Encounter: Payer: Self-pay | Admitting: Sleep Medicine

## 2024-06-30 ENCOUNTER — Telehealth: Payer: Self-pay

## 2024-06-30 ENCOUNTER — Ambulatory Visit (INDEPENDENT_AMBULATORY_CARE_PROVIDER_SITE_OTHER): Admitting: Sleep Medicine

## 2024-06-30 VITALS — BP 122/84 | HR 82 | Temp 97.1°F | Ht 68.0 in | Wt 207.2 lb

## 2024-06-30 DIAGNOSIS — G4733 Obstructive sleep apnea (adult) (pediatric): Secondary | ICD-10-CM

## 2024-06-30 DIAGNOSIS — I10 Essential (primary) hypertension: Secondary | ICD-10-CM | POA: Diagnosis not present

## 2024-06-30 NOTE — Progress Notes (Signed)
 Name:Luke [name] MRN: [id number] DOB: 1980-12-17   CHIEF COMPLAINT:  EXCESSIVE DAYTIME SLEEPINESS   HISTORY OF PRESENT ILLNESS: Luke [name] is a 43 y.o. w/ a h/o OSA, HTN, DMII, hyperlipidemia and obesity who presents for CPAP F/U visit. Reports still getting used to CPAP therapy. Also reports also having chest pain at night while using CPAP and has difficulty falling back to sleep. States that he has a cardiology evaluation coming up. Reports that he is currently using the Airfit P10 nasal mask which he likes better than the Airtouch N30i nasal mask.    EPWORTH SLEEP SCORE    02/08/2024    8:40 AM 08/09/2023    1:00 PM  Results of the Epworth flowsheet  Sitting and reading 3 3  Watching TV 3 3  Sitting, inactive in a public place (e.g. a theatre or a meeting) 2 3  As a passenger in a car for an hour without a break 3 3  Lying down to rest in the afternoon when circumstances permit 3 3  Sitting and talking to someone 3 1  Sitting quietly after a lunch without alcohol 3 3  In a car, while stopped for a few minutes in traffic 2 0  Total score 22 19    PAST MEDICAL HISTORY :   has a past medical history of Anemia, B12 deficiency, Cecum mass (08/15/2018), Chronic kidney disease, Colonic neoplasm, Columnar-lined esophagus, Diverticulosis of large intestine without diverticulitis, Dyslipidemia (high LDL; low HDL), Gastric polyp, GERD (gastroesophageal reflux disease), Hyperlipidemia, Hyperlipidemia associated with type 2 diabetes mellitus (HCC), Hypertension, Peripheral polyneuropathy, Pneumonia (09/07/2016), Sleep apnea, Stomach irritation, Type 2 diabetes mellitus treated with insulin  (HCC), and Unilateral inguinal hernia, without obstruction or gangrene, not specified as recurrent.  has a past surgical history that includes Colonoscopy with propofol  (N/A, 07/28/2018); Esophagogastroduodenoscopy (egd) with propofol  (N/A, 07/28/2018); Partial colectomy (Right, 08/15/2018); Colon  surgery; Colonoscopy with propofol  (N/A, 10/18/2019); Colonoscopy with propofol  (N/A, 02/17/2021); XI Robotic assisted inguinal hernia repair with mesh (Right, 01/07/2023); Ventral hernia repair (N/A, 08/17/2023); and Colonoscopy with propofol  (N/A, 11/24/2023). Prior to Admission medications   Medication Sig Start Date End Date Taking? Authorizing Provider  amLODipine  (NORVASC ) 5 MG tablet Take 1 tablet (5 mg total) by mouth daily. 07/26/23 07/16/24 Yes Vincente Saber, NP  blood glucose meter kit and supplies KIT Dispense based on patient and insurance preference. Use up to four times daily as directed. (FOR ICD-9 250.00, 250.01). 04/04/20  Yes Iloabachie, Chioma E, NP  empagliflozin  (JARDIANCE ) 10 MG TABS tablet Take 1 tablet (10 mg total) by mouth every morning. 11/26/23  Yes Kaur, Charanpreet, NP  fenofibrate  54 MG tablet Take 1 tablet (54 mg total) by mouth daily. 10/07/23  Yes Kaur, Charanpreet, NP  gabapentin  (NEURONTIN ) 300 MG capsule Take 1 capsule (300 mg total) by mouth at bedtime. 01/27/24 06/30/24 Yes Kaur, Charanpreet, NP  insulin  glargine (LANTUS ) 100 unit/mL SOPN Inject 40 Units into the skin at bedtime. Patient taking differently: Inject 45 Units into the skin at bedtime. 11/04/23  Yes Vincente Saber, NP  insulin  lispro (HUMALOG ) 100 UNIT/ML KwikPen Inject 10 Units into the skin 3 (three) times daily. Patient taking differently: Inject 14 Units into the skin 3 (three) times daily. 03/31/24  Yes Vincente Saber, NP  Insulin  Pen Needle (NOVOFINE PEN NEEDLE) 32G X 6 MM MISC use as directed 01/27/22  Yes Margrette Levorn POUR, [facility]  Insulin  Pen Needle 32G X 4 MM MISC USE AS DIRECTED WITH INSULIN . 01/27/22  Yes Harrison, Keri K, RPH  metoprolol  tartrate (LOPRESSOR ) 100 MG tablet Take 1 tablet (100 mg total) by mouth once for 1 dose. Take 90 - 120 min prior to procedure 06/16/24 06/30/24 Yes Hammock, Sheri, NP  pravastatin  (PRAVACHOL ) 80 MG tablet Take 1 tablet (80 mg total) by mouth daily. 01/03/24 07/03/24  Yes Vincente Saber, NP  metFORMIN  (GLUCOPHAGE ) 850 MG tablet Take 1 tablet (850 mg total) by mouth 2 (two) times daily with a meal. 12/18/20 12/19/20  Iloabachie, Chioma E, NP   No Known Allergies  FAMILY HISTORY:  family history includes Arthritis in his father; Breast cancer in his maternal grandmother and mother; CAD in his maternal grandmother; Cancer in his mother; Colon cancer in his paternal uncle; Diabetes in his mother and paternal grandmother; Heart disease in his mother; Hypertension in his father and mother; Throat cancer in his maternal grandfather. SOCIAL HISTORY:  reports that he quit smoking about 4 years ago. His smoking use included cigarettes. He has been exposed to tobacco smoke. He has never used smokeless tobacco. He reports current alcohol use of about 2.0 standard drinks of alcohol per week. He reports that he does not currently use drugs after having used the following drugs: Marijuana.   Review of Systems:  Gen:  Denies  fever, sweats, chills weight loss  HEENT: Denies blurred vision, double vision, ear pain, eye pain, hearing loss, nose bleeds, sore throat Cardiac:  No dizziness, chest pain or heaviness, chest tightness,edema, No JVD Resp:   No cough, -sputum production, -shortness of breath,-wheezing, -hemoptysis,  Gi: Denies swallowing difficulty, stomach pain, nausea or vomiting, diarrhea, constipation, bowel incontinence Gu:  Denies bladder incontinence, burning urine Ext:   Denies Joint pain, stiffness or swelling Skin: Denies  skin rash, easy bruising or bleeding or hives Endoc:  Denies polyuria, polydipsia , polyphagia or weight change Psych:   Denies depression, insomnia or hallucinations  Other:  All other systems negative  VITAL SIGNS: BP 122/84   Pulse 82   Temp (!) 97.1 F (36.2 C)   Ht 5' 8 (1.727 m)   Wt 207 lb 3.2 oz (94 kg)   SpO2 98%   BMI 31.50 kg/m    Physical Examination:   General Appearance: No distress  EYES PERRLA, EOM  intact.   NECK Supple, No JVD Pulmonary: normal breath sounds, No wheezing.  CardiovascularNormal S1,S2.  No m/r/g.   Abdomen: Benign, Soft, non-tender. Skin:   warm, no rashes, no ecchymosis  Extremities: normal, no cyanosis, clubbing. Neuro:without focal findings,  speech normal  PSYCHIATRIC: Mood, affect within normal limits.   ASSESSMENT AND PLAN  OSA Due to elevated AHI, will increase max pressure to 20 cm H2O. Will also complete in lab CPAP/BIPAP titration study. Discussed the consequences of untreated sleep apnea. Advised not to drive drowsy for safety of patient and others. Will complete further evaluation with CPAP/BIPAP titration and follow up to review results.    HTN Stable, on current management. Following with PCP.    MEDICATION ADJUSTMENTS/LABS AND TESTS ORDERED: Recommend Sleep Study   Patient  satisfied with Plan of action and management. All questions answered  Follow up to review titration results and treatment plan.   I spent a total of 20 minutes reviewing chart data, face-to-face evaluation with the patient, counseling and coordination of care as detailed above.    Anabelen Kaminsky, M.D.  Sleep Medicine [facility]

## 2024-06-30 NOTE — Telephone Encounter (Signed)
 Patient was seen in the office today. Dr. Jess wants his pressure settings changed to 6-20 cmH2O. I am unable to change the settings online so the patient will have to bring his SD card in so I can get a download.  I have notified the patient and he will bring his SD card in today or Tuesday (we are off Monday 9/1) for us  to change his settings.

## 2024-07-05 ENCOUNTER — Telehealth (HOSPITAL_COMMUNITY): Payer: Self-pay | Admitting: *Deleted

## 2024-07-05 NOTE — Telephone Encounter (Signed)
Attempted to call patient regarding upcoming cardiac CT appointment. Unable to leave VM (VM not set up). Johney Frame RN Navigator Cardiac Imaging [facility] [phone] Office

## 2024-07-06 ENCOUNTER — Ambulatory Visit
Admission: RE | Admit: 2024-07-06 | Discharge: 2024-07-06 | Disposition: A | Discharge status: Home / Self Care | Source: Ambulatory Visit | Attending: Cardiology | Admitting: Cardiology

## 2024-07-06 DIAGNOSIS — R072 Precordial pain: Secondary | ICD-10-CM | POA: Insufficient documentation

## 2024-07-06 MED ORDER — NITROGLYCERIN 0.4 MG SL SUBL
0.8000 mg | SUBLINGUAL_TABLET | Freq: Once | SUBLINGUAL | Status: AC
  Administered 2024-07-06: 0.8 mg via SUBLINGUAL

## 2024-07-06 MED ORDER — IOHEXOL 350 MG/ML SOLN
100.0000 mL | Freq: Once | INTRAVENOUS | Status: AC | PRN
  Administered 2024-07-06: 100 mL via INTRAVENOUS

## 2024-07-06 NOTE — Progress Notes (Signed)
 Coronary calcium  score 10.2.  Minimal nonobstructive coronary artery disease.  Consider nonatherosclerotic causes of chest pain and work on preventative therapy.  Radiology over read of chest CT pending.

## 2024-07-06 NOTE — Progress Notes (Signed)
 Patient tolerated CT well. Verbalized instructions to drink water throughout day. Vitals stable.

## 2024-07-07 ENCOUNTER — Other Ambulatory Visit: Payer: Self-pay | Admitting: Nurse Practitioner

## 2024-07-07 ENCOUNTER — Other Ambulatory Visit: Payer: Self-pay

## 2024-07-07 ENCOUNTER — Other Ambulatory Visit: Payer: Self-pay | Admitting: Cardiology

## 2024-07-07 DIAGNOSIS — E1169 Type 2 diabetes mellitus with other specified complication: Secondary | ICD-10-CM

## 2024-07-07 DIAGNOSIS — E1140 Type 2 diabetes mellitus with diabetic neuropathy, unspecified: Secondary | ICD-10-CM

## 2024-07-07 MED ORDER — INSULIN LISPRO (1 UNIT DIAL) 100 UNIT/ML (KWIKPEN)
10.0000 [IU] | PEN_INJECTOR | Freq: Three times a day (TID) | SUBCUTANEOUS | 2 refills | Status: DC
  Filled 2024-07-07 – 2024-07-18 (×2): qty 9, 30d supply, fill #0
  Filled 2024-08-14: qty 9, 30d supply, fill #1
  Filled 2024-10-02: qty 9, 30d supply, fill #2

## 2024-07-07 MED ORDER — PRAVASTATIN SODIUM 80 MG PO TABS
80.0000 mg | ORAL_TABLET | Freq: Every day | ORAL | 1 refills | Status: AC
  Filled 2024-07-07 – 2024-07-18 (×2): qty 90, 90d supply, fill #0
  Filled 2024-10-31: qty 90, 90d supply, fill #1

## 2024-07-07 NOTE — Telephone Encounter (Signed)
Reviewed results with patient and he verbalized understanding with no further questions at this time. 

## 2024-07-07 NOTE — Telephone Encounter (Signed)
 Pt requesting a c/b to go over results.

## 2024-07-09 NOTE — Telephone Encounter (Signed)
 Please schedule OV.

## 2024-07-10 NOTE — Telephone Encounter (Signed)
 Attempted to call pt. No answer. No voicemail. Please scheduled an appt when pt returns call.

## 2024-07-10 NOTE — Progress Notes (Signed)
 No abnormalities found on the chest CT completed as part of the coronary CTA.

## 2024-07-10 NOTE — Telephone Encounter (Signed)
 Pt returning call

## 2024-07-17 ENCOUNTER — Other Ambulatory Visit: Payer: Self-pay

## 2024-07-17 ENCOUNTER — Other Ambulatory Visit (HOSPITAL_COMMUNITY): Payer: Self-pay

## 2024-07-18 ENCOUNTER — Other Ambulatory Visit: Payer: Self-pay

## 2024-07-20 ENCOUNTER — Ambulatory Visit: Attending: Cardiology

## 2024-07-20 DIAGNOSIS — R0602 Shortness of breath: Secondary | ICD-10-CM | POA: Diagnosis not present

## 2024-07-20 LAB — ECHOCARDIOGRAM COMPLETE
AR max vel: 2.98 cm2
AV Area VTI: 2.69 cm2
AV Area mean vel: 2.73 cm2
AV Mean grad: 4 mmHg
AV Peak grad: 7.4 mmHg
Ao pk vel: 1.36 m/s
Area-P 1/2: 4.44 cm2
Calc EF: 53.8 %
S' Lateral: 2.7 cm
Single Plane A2C EF: 53.6 %
Single Plane A4C EF: 52.5 %

## 2024-07-20 NOTE — Progress Notes (Signed)
 Heart squeeze is noted 60 to 65% which is normal function.  There are no wall motion abnormalities noted, there is some stiffness noted to the muscle which causes impaired relaxation of the muscle or stiffening, likely related to hypertension, no valvular abnormalities were noted.  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  Will need to continue to follow-up with surveillance studies and related to this.  Overall reassuring study with no findings to support symptoms.

## 2024-07-21 ENCOUNTER — Telehealth: Payer: Self-pay | Admitting: Cardiology

## 2024-07-21 ENCOUNTER — Ambulatory Visit: Admitting: Nurse Practitioner

## 2024-07-21 DIAGNOSIS — I10 Essential (primary) hypertension: Secondary | ICD-10-CM | POA: Insufficient documentation

## 2024-07-21 NOTE — Progress Notes (Deleted)
 Established Patient Office Visit  Subjective:  Patient ID: Luke [name], male    DOB: December 31, 1980  Age: 43 y.o. MRN: [id number]  CC: No chief complaint on file.  Discussed the use of a AI scribe software for clinical note transcription with the patient, who gave verbal consent to proceed.  HPI  Luke [name] presents for:  HPI   Past Medical History:  Diagnosis Date   Anemia    B12 deficiency    Cecum mass 08/15/2018   a.) 4.8 cm pedunculated cecal mass; Bx (+) for villous adeoma. b.) s/p RIGHT colectomy.   Chronic kidney disease    Colonic neoplasm    Columnar-lined esophagus    Diverticulosis of large intestine without diverticulitis    Dyslipidemia (high LDL; low HDL)    Gastric polyp    GERD (gastroesophageal reflux disease)    Hyperlipidemia    Hyperlipidemia associated with type 2 diabetes mellitus (HCC)    Hypertension    Peripheral polyneuropathy    Pneumonia 09/07/2016   Sleep apnea    Stomach irritation    Type 2 diabetes mellitus treated with insulin  (HCC)    Unilateral inguinal hernia, without obstruction or gangrene, not specified as recurrent     Past Surgical History:  Procedure Laterality Date   COLON SURGERY     COLONOSCOPY WITH PROPOFOL  N/A 07/28/2018   Procedure: COLONOSCOPY WITH PROPOFOL ;  Surgeon: Janalyn Keene NOVAK, MD;  Location: [facility] ENDOSCOPY;  Service: Endoscopy;  Laterality: N/A;   COLONOSCOPY WITH PROPOFOL  N/A 10/18/2019   Procedure: COLONOSCOPY WITH PROPOFOL ;  Surgeon: Janalyn Keene NOVAK, MD;  Location: [facility] ENDOSCOPY;  Service: Endoscopy;  Laterality: N/A;   COLONOSCOPY WITH PROPOFOL  N/A 02/17/2021   Procedure: COLONOSCOPY WITH PROPOFOL ;  Surgeon: Janalyn Keene NOVAK, MD;  Location: [facility] ENDOSCOPY;  Service: Endoscopy;  Laterality: N/A;   COLONOSCOPY WITH PROPOFOL  N/A 11/24/2023   Procedure: COLONOSCOPY WITH PROPOFOL ;  Surgeon: Unk Corinn Skiff, MD;  Location: [facility] ENDOSCOPY;  Service: Gastroenterology;  Laterality: N/A;    ESOPHAGOGASTRODUODENOSCOPY (EGD) WITH PROPOFOL  N/A 07/28/2018   Procedure: ESOPHAGOGASTRODUODENOSCOPY (EGD) WITH PROPOFOL ;  Surgeon: Janalyn Keene NOVAK, MD;  Location: [facility] ENDOSCOPY;  Service: Endoscopy;  Laterality: N/A;   PARTIAL COLECTOMY Right 08/15/2018   Procedure: RIGHT  COLECTOMY;  Surgeon: Nicholaus Selinda Birmingham, MD;  Location: [facility] ORS;  Service: General;  Laterality: Right;   VENTRAL HERNIA REPAIR N/A 08/17/2023   Procedure: HERNIA REPAIR VENTRAL ADULT, open;  Surgeon: Jordis Laneta FALCON, MD;  Location: [facility] ORS;  Service: General;  Laterality: N/A;   XI ROBOTIC ASSISTED INGUINAL HERNIA REPAIR WITH MESH Right 01/07/2023   Procedure: XI ROBOTIC ASSISTED INGUINAL HERNIA REPAIR WITH MESH, poss Bilateral;  Surgeon: Jordis Laneta FALCON, MD;  Location: [facility] ORS;  Service: General;  Laterality: Right;  Possible Bilateral    Family History  Problem Relation Age of Onset   Heart disease Mother    Diabetes Mother    Cancer Mother    Breast cancer Mother    Hypertension Mother    Arthritis Father    Hypertension Father    Colon cancer Paternal Uncle    CAD Maternal Grandmother    Breast cancer Maternal Grandmother    Throat cancer Maternal Grandfather    Diabetes Paternal Grandmother     Social History   Socioeconomic History   Marital status: Single    Spouse name: Not on file   Number of children: Not on file   Years of education: 12   Highest education level:  Not on file  Occupational History    Comment: [employer]  Tobacco Use   Smoking status: Former    Current packs/day: 0.00    Types: Cigarettes    Quit date: 03/25/2020    Years since quitting: 4.3    Passive exposure: Past   Smokeless tobacco: Never   Tobacco comments:    patient is social smoker/ smokes when he drinks   Vaping Use   Vaping status: Never Used  Substance and Sexual Activity   Alcohol use: Yes    Alcohol/week: 2.0 standard drinks of alcohol    Types: 2 Shots of liquor per week   Drug use: Not Currently     Types: Marijuana    Comment: last use 2020   Sexual activity: Not Currently    Birth control/protection: Condom  Other Topics Concern   Not on file  Social History Narrative   Not on file   Social Drivers of Health   Financial Resource Strain: Not on file  Food Insecurity: No Food Insecurity (04/15/2022)   Hunger Vital Sign    Worried About Running Out of Food in the Last Year: Never true    Ran Out of Food in the Last Year: Never true  Transportation Needs: No Transportation Needs (04/15/2022)   PRAPARE - Administrator, Civil Service (Medical): No    Lack of Transportation (Non-Medical): No  Physical Activity: Not on file  Stress: Not on file  Social Connections: Not on file  Intimate Partner Violence: Not on file     Outpatient Medications Prior to Visit  Medication Sig Dispense Refill   amLODipine  (NORVASC ) 5 MG tablet Take 1 tablet (5 mg total) by mouth daily. 90 tablet 3   blood glucose meter kit and supplies KIT Dispense based on patient and insurance preference. Use up to four times daily as directed. (FOR ICD-9 250.00, 250.01). 1 each 0   empagliflozin  (JARDIANCE ) 10 MG TABS tablet Take 1 tablet (10 mg total) by mouth every morning. 30 tablet 1   fenofibrate  54 MG tablet Take 1 tablet (54 mg total) by mouth daily. 90 tablet 1   gabapentin  (NEURONTIN ) 300 MG capsule Take 1 capsule (300 mg total) by mouth at bedtime. 30 capsule 2   insulin  glargine (LANTUS ) 100 unit/mL SOPN Inject 40 Units into the skin at bedtime. (Patient taking differently: Inject 45 Units into the skin at bedtime.) 15 mL 11   insulin  lispro (HUMALOG ) 100 UNIT/ML KwikPen Inject 10 Units into the skin 3 (three) times daily. 9 mL 2   Insulin  Pen Needle (NOVOFINE PEN NEEDLE) 32G X 6 MM MISC use as directed 100 each 98   Insulin  Pen Needle 32G X 4 MM MISC USE AS DIRECTED WITH INSULIN . 100 each PRN   metoprolol  tartrate (LOPRESSOR ) 100 MG tablet Take 1 tablet (100 mg total) by mouth once for 1  dose. Take 90 - 120 min prior to procedure 1 tablet 0   pravastatin  (PRAVACHOL ) 80 MG tablet Take 1 tablet (80 mg total) by mouth daily. 90 tablet 1   No facility-administered medications prior to visit.    No Known Allergies  ROS Review of Systems Negative unless indicated in HPI.    Objective:    Physical Exam  There were no vitals taken for this visit. Wt Readings from Last 3 Encounters:  06/30/24 207 lb 3.2 oz (94 kg)  06/16/24 208 lb (94.3 kg)  05/09/24 208 lb 9.6 oz (94.6 kg)  Health Maintenance  Topic Date Due   Hepatitis C Screening  Never done   DTaP/Tdap/Td (1 - Tdap) Never done   Pneumococcal Vaccine (1 of 2 - PCV) Never done   Hepatitis B Vaccines 19-59 Average Risk (1 of 3 - 19+ 3-dose series) Never done   HPV VACCINES (1 - 3-dose SCDM series) Never done   OPHTHALMOLOGY EXAM  01/31/2022   Diabetic kidney evaluation - Urine ACR  01/15/2023   HEMOGLOBIN A1C  04/04/2024   COVID-19 Vaccine (4 - 2025-26 season) 08/04/2024 (Originally 07/03/2024)   Influenza Vaccine  01/30/2025 (Originally 06/02/2024)   FOOT EXAM  11/03/2024   Diabetic kidney evaluation - eGFR measurement  06/16/2025   Colonoscopy  11/23/2028   HIV Screening  Completed   Meningococcal B Vaccine  Aged Out       Topic Date Due   Hepatitis B Vaccines 19-59 Average Risk (1 of 3 - 19+ 3-dose series) Never done   HPV VACCINES (1 - 3-dose SCDM series) Never done    Lab Results  Component Value Date   TSH 0.891 02/28/2020   Lab Results  Component Value Date   WBC 6.2 10/05/2023   HGB 16.0 10/05/2023   HCT 50.3 10/05/2023   MCV 87.4 10/05/2023   PLT 191.0 10/05/2023   Lab Results  Component Value Date   NA 140 06/16/2024   K 4.1 06/16/2024   CO2 22 06/16/2024   GLUCOSE 106 (H) 06/16/2024   BUN 8 06/16/2024   CREATININE 1.11 06/16/2024   BILITOT 0.7 10/05/2023   ALKPHOS 94 10/05/2023   AST 19 10/05/2023   ALT 19 10/05/2023   PROT 7.4 10/05/2023   ALBUMIN 4.3 10/05/2023   CALCIUM   9.5 06/16/2024   ANIONGAP 12 07/27/2023   EGFR 84 06/16/2024   GFR 73.20 10/05/2023   Lab Results  Component Value Date   CHOL 171 10/05/2023   Lab Results  Component Value Date   HDL 32.30 (L) 10/05/2023   Lab Results  Component Value Date   LDLCALC 71 09/10/2021   Lab Results  Component Value Date   TRIG (H) 10/05/2023    427.0 Triglyceride is over 400; calculations on Lipids are invalid.   Lab Results  Component Value Date   CHOLHDL 5 10/05/2023   Lab Results  Component Value Date   HGBA1C 12.0 (H) 10/05/2023      Assessment & Plan:  Essential hypertension    Follow-up: No follow-ups on file.   Suzzette Gasparro, NP

## 2024-07-21 NOTE — Telephone Encounter (Signed)
 Called patient to go over echocardiogram results. Explained some of the findings to patient and also answered all of his questions. Told patient the results would also be coming to his house for review as well. Patient verbalized understanding. No further questions at this time

## 2024-07-21 NOTE — Telephone Encounter (Signed)
Patient called for his echo results.

## 2024-07-24 ENCOUNTER — Telehealth: Payer: Self-pay | Admitting: Cardiology

## 2024-07-24 NOTE — Telephone Encounter (Signed)
 Called to advise patient that the medication he is requesting is not on his med list, nor is it a medication rx'd by cardiology - advised that medication is available OTC as Zyrtec

## 2024-07-24 NOTE — Telephone Encounter (Signed)
 Pt c/o medication issue:  1. Name of Medication: levocetirizine 5mg   2. How are you currently taking this medication (dosage and times per day)?    3. Are you having a reaction (difficulty breathing--STAT)? no  4. What is your medication issue? Calling to get a refill but medication is not listed. Please advise

## 2024-07-28 ENCOUNTER — Encounter: Payer: Self-pay | Admitting: Nurse Practitioner

## 2024-07-28 ENCOUNTER — Ambulatory Visit: Admitting: Nurse Practitioner

## 2024-07-28 ENCOUNTER — Other Ambulatory Visit: Payer: Self-pay

## 2024-07-28 ENCOUNTER — Other Ambulatory Visit (HOSPITAL_COMMUNITY): Payer: Self-pay

## 2024-07-28 ENCOUNTER — Telehealth: Payer: Self-pay

## 2024-07-28 VITALS — BP 126/80 | HR 74 | Temp 98.3°F | Ht 68.0 in | Wt 208.6 lb

## 2024-07-28 DIAGNOSIS — Z23 Encounter for immunization: Secondary | ICD-10-CM

## 2024-07-28 DIAGNOSIS — E1140 Type 2 diabetes mellitus with diabetic neuropathy, unspecified: Secondary | ICD-10-CM

## 2024-07-28 DIAGNOSIS — I10 Essential (primary) hypertension: Secondary | ICD-10-CM

## 2024-07-28 DIAGNOSIS — E781 Pure hyperglyceridemia: Secondary | ICD-10-CM

## 2024-07-28 DIAGNOSIS — Z794 Long term (current) use of insulin: Secondary | ICD-10-CM | POA: Diagnosis not present

## 2024-07-28 DIAGNOSIS — E1165 Type 2 diabetes mellitus with hyperglycemia: Secondary | ICD-10-CM

## 2024-07-28 LAB — POCT GLYCOSYLATED HEMOGLOBIN (HGB A1C): Hemoglobin A1C: 7.6 % — AB (ref 4.0–5.6)

## 2024-07-28 MED ORDER — AMLODIPINE BESYLATE 5 MG PO TABS
5.0000 mg | ORAL_TABLET | Freq: Every day | ORAL | 3 refills | Status: AC
  Filled 2024-07-28 – 2024-08-14 (×2): qty 90, 90d supply, fill #0
  Filled 2024-10-31: qty 90, 90d supply, fill #1

## 2024-07-28 MED ORDER — FREESTYLE LIBRE 3 SENSOR MISC
3 refills | Status: AC
  Filled 2024-07-28: qty 2, 28d supply, fill #0

## 2024-07-28 MED ORDER — GABAPENTIN 300 MG PO CAPS
300.0000 mg | ORAL_CAPSULE | Freq: Every day | ORAL | 2 refills | Status: AC
  Filled 2024-07-28: qty 30, 30d supply, fill #0
  Filled 2024-10-02: qty 30, 30d supply, fill #1
  Filled 2024-10-31: qty 30, 30d supply, fill #2

## 2024-07-28 NOTE — Telephone Encounter (Signed)
 Noted

## 2024-07-28 NOTE — Telephone Encounter (Signed)
 Called [facility] to see if [employer] 3 sensor is covered by his Insurance. Pharmacist states he needs a Prior Authorization.

## 2024-07-28 NOTE — Progress Notes (Signed)
 Established Patient Office Visit  Subjective:  Patient ID: Luke [name], male    DOB: May 16, 1981  Age: 43 y.o. MRN: [id number]  CC:  Chief Complaint  Patient presents with   Medical Management of Chronic Issues   Discussed the use of a AI scribe software for clinical note transcription with the patient, who gave verbal consent to proceed.  HPI  Luke [name] presents for chronic disease follow up.  He has diabetes with recent blood sugar levels at 125 mg/dL. He administers insulin , taking 14 units during the day and 45 units at night, but does not consistently check his blood sugar before insulin  administration. He senses low blood sugar, experiencing symptoms that prompt him to consume something sweet. He has numbness and tingling in his hands and feet.   He manages hypertension with amlodipine  and reports compliance with his medication regimen. He also takes fenofibrate  and pravastatin  for cholesterol management.  He is followed by cardiology for precordial pain had  Echo on 07/20/24, has appointment with cardiology.  He is on CPAP for sleep apnea.   HPI   Past Medical History:  Diagnosis Date   Anemia    B12 deficiency    Cecum mass 08/15/2018   a.) 4.8 cm pedunculated cecal mass; Bx (+) for villous adeoma. b.) s/p RIGHT colectomy.   Chronic kidney disease    Colonic neoplasm    Columnar-lined esophagus    Diverticulosis of large intestine without diverticulitis    Dyslipidemia (high LDL; low HDL)    Gastric polyp    GERD (gastroesophageal reflux disease)    Hyperlipidemia    Hyperlipidemia associated with type 2 diabetes mellitus (HCC)    Hypertension    Peripheral polyneuropathy    Pneumonia 09/07/2016   Sleep apnea    Stomach irritation    Type 2 diabetes mellitus treated with insulin  (HCC)    Unilateral inguinal hernia, without obstruction or gangrene, not specified as recurrent     Past Surgical History:  Procedure Laterality Date   COLON SURGERY      COLONOSCOPY WITH PROPOFOL  N/A 07/28/2018   Procedure: COLONOSCOPY WITH PROPOFOL ;  Surgeon: Janalyn Keene NOVAK, MD;  Location: [facility] ENDOSCOPY;  Service: Endoscopy;  Laterality: N/A;   COLONOSCOPY WITH PROPOFOL  N/A 10/18/2019   Procedure: COLONOSCOPY WITH PROPOFOL ;  Surgeon: Janalyn Keene NOVAK, MD;  Location: [facility] ENDOSCOPY;  Service: Endoscopy;  Laterality: N/A;   COLONOSCOPY WITH PROPOFOL  N/A 02/17/2021   Procedure: COLONOSCOPY WITH PROPOFOL ;  Surgeon: Janalyn Keene NOVAK, MD;  Location: [facility] ENDOSCOPY;  Service: Endoscopy;  Laterality: N/A;   COLONOSCOPY WITH PROPOFOL  N/A 11/24/2023   Procedure: COLONOSCOPY WITH PROPOFOL ;  Surgeon: Unk Corinn Skiff, MD;  Location: [facility] ENDOSCOPY;  Service: Gastroenterology;  Laterality: N/A;   ESOPHAGOGASTRODUODENOSCOPY (EGD) WITH PROPOFOL  N/A 07/28/2018   Procedure: ESOPHAGOGASTRODUODENOSCOPY (EGD) WITH PROPOFOL ;  Surgeon: Janalyn Keene NOVAK, MD;  Location: [facility] ENDOSCOPY;  Service: Endoscopy;  Laterality: N/A;   PARTIAL COLECTOMY Right 08/15/2018   Procedure: RIGHT  COLECTOMY;  Surgeon: Nicholaus Selinda Birmingham, MD;  Location: [facility] ORS;  Service: General;  Laterality: Right;   VENTRAL HERNIA REPAIR N/A 08/17/2023   Procedure: HERNIA REPAIR VENTRAL ADULT, open;  Surgeon: Jordis Laneta FALCON, MD;  Location: [facility] ORS;  Service: General;  Laterality: N/A;   XI ROBOTIC ASSISTED INGUINAL HERNIA REPAIR WITH MESH Right 01/07/2023   Procedure: XI ROBOTIC ASSISTED INGUINAL HERNIA REPAIR WITH MESH, poss Bilateral;  Surgeon: Jordis Laneta FALCON, MD;  Location: [facility] ORS;  Service: General;  Laterality: Right;  Possible Bilateral    Family History  Problem Relation Age of Onset   Heart disease Mother    Diabetes Mother    Cancer Mother    Breast cancer Mother    Hypertension Mother    Arthritis Father    Hypertension Father    Colon cancer Paternal Uncle    CAD Maternal Grandmother    Breast cancer Maternal Grandmother    Throat cancer Maternal Grandfather    Diabetes  Paternal Grandmother     Social History   Socioeconomic History   Marital status: Single    Spouse name: Not on file   Number of children: Not on file   Years of education: 12   Highest education level: Not on file  Occupational History    Comment: [employer]  Tobacco Use   Smoking status: Former    Current packs/day: 0.00    Types: Cigarettes    Quit date: 03/25/2020    Years since quitting: 4.3    Passive exposure: Past   Smokeless tobacco: Never   Tobacco comments:    patient is social smoker/ smokes when he drinks   Vaping Use   Vaping status: Never Used  Substance and Sexual Activity   Alcohol use: Yes    Alcohol/week: 2.0 standard drinks of alcohol    Types: 2 Shots of liquor per week   Drug use: Not Currently    Types: Marijuana    Comment: last use 2020   Sexual activity: Not Currently    Birth control/protection: Condom  Other Topics Concern   Not on file  Social History Narrative   Not on file   Social Drivers of Health   Financial Resource Strain: Not on file  Food Insecurity: No Food Insecurity (04/15/2022)   Hunger Vital Sign    Worried About Running Out of Food in the Last Year: Never true    Ran Out of Food in the Last Year: Never true  Transportation Needs: No Transportation Needs (04/15/2022)   PRAPARE - Administrator, Civil Service (Medical): No    Lack of Transportation (Non-Medical): No  Physical Activity: Not on file  Stress: Not on file  Social Connections: Not on file  Intimate Partner Violence: Not on file     Outpatient Medications Prior to Visit  Medication Sig Dispense Refill   blood glucose meter kit and supplies KIT Dispense based on patient and insurance preference. Use up to four times daily as directed. (FOR ICD-9 250.00, 250.01). 1 each 0   empagliflozin  (JARDIANCE ) 10 MG TABS tablet Take 1 tablet (10 mg total) by mouth every morning. 30 tablet 1   fenofibrate  54 MG tablet Take 1 tablet (54 mg total) by mouth  daily. 90 tablet 1   insulin  glargine (LANTUS ) 100 unit/mL SOPN Inject 40 Units into the skin at bedtime. (Patient taking differently: Inject 45 Units into the skin at bedtime.) 15 mL 11   insulin  lispro (HUMALOG ) 100 UNIT/ML KwikPen Inject 10 Units into the skin 3 (three) times daily. 9 mL 2   Insulin  Pen Needle (NOVOFINE PEN NEEDLE) 32G X 6 MM MISC use as directed 100 each 98   Insulin  Pen Needle 32G X 4 MM MISC USE AS DIRECTED WITH INSULIN . 100 each PRN   pravastatin  (PRAVACHOL ) 80 MG tablet Take 1 tablet (80 mg total) by mouth daily. 90 tablet 1   amLODipine  (NORVASC ) 5 MG tablet Take 1 tablet (5 mg total) by mouth daily. 90 tablet 3  gabapentin  (NEURONTIN ) 300 MG capsule Take 1 capsule (300 mg total) by mouth at bedtime. 30 capsule 2   metoprolol  tartrate (LOPRESSOR ) 100 MG tablet Take 1 tablet (100 mg total) by mouth once for 1 dose. Take 90 - 120 min prior to procedure (Patient not taking: Reported on 07/31/2024) 1 tablet 0   No facility-administered medications prior to visit.    No Known Allergies  ROS Review of Systems Negative unless indicated in HPI.    Objective:    Physical Exam Constitutional:      Appearance: Normal appearance.  HENT:     Mouth/Throat:     Mouth: Mucous membranes are moist.  Eyes:     Conjunctiva/sclera: Conjunctivae normal.     Pupils: Pupils are equal, round, and reactive to light.  Cardiovascular:     Rate and Rhythm: Normal rate and regular rhythm.     Pulses: Normal pulses.     Heart sounds: Normal heart sounds.  Pulmonary:     Effort: Pulmonary effort is normal.     Breath sounds: Normal breath sounds.  Abdominal:     General: Bowel sounds are normal.     Palpations: Abdomen is soft.  Musculoskeletal:     Cervical back: Normal range of motion. No tenderness.  Skin:    General: Skin is warm.     Findings: No bruising.  Neurological:     General: No focal deficit present.     Mental Status: He is alert and oriented to person, place,  and time. Mental status is at baseline.  Psychiatric:        Mood and Affect: Mood normal.        Behavior: Behavior normal.        Thought Content: Thought content normal.        Judgment: Judgment normal.     BP 126/80   Pulse 74   Temp 98.3 F (36.8 C)   Ht 5' 8 (1.727 m)   Wt 208 lb 9.6 oz (94.6 kg)   SpO2 98%   BMI 31.72 kg/m  Wt Readings from Last 3 Encounters:  07/31/24 210 lb 6 oz (95.4 kg)  07/28/24 208 lb 9.6 oz (94.6 kg)  06/30/24 207 lb 3.2 oz (94 kg)     Health Maintenance  Topic Date Due   Hepatitis C Screening  Never done   DTaP/Tdap/Td (1 - Tdap) Never done   Pneumococcal Vaccine (1 of 2 - PCV) Never done   Hepatitis B Vaccines 19-59 Average Risk (1 of 3 - 19+ 3-dose series) Never done   HPV VACCINES (1 - 3-dose SCDM series) Never done   OPHTHALMOLOGY EXAM  01/31/2022   Diabetic kidney evaluation - Urine ACR  01/15/2023   COVID-19 Vaccine (4 - 2025-26 season) 07/03/2024   FOOT EXAM  11/03/2024   HEMOGLOBIN A1C  01/25/2025   Diabetic kidney evaluation - eGFR measurement  06/16/2025   Colonoscopy  11/23/2028   Influenza Vaccine  Completed   HIV Screening  Completed   Meningococcal B Vaccine  Aged Out       Topic Date Due   Hepatitis B Vaccines 19-59 Average Risk (1 of 3 - 19+ 3-dose series) Never done   HPV VACCINES (1 - 3-dose SCDM series) Never done    Lab Results  Component Value Date   TSH 0.891 02/28/2020   Lab Results  Component Value Date   WBC 6.2 10/05/2023   HGB 16.0 10/05/2023   HCT 50.3 10/05/2023   MCV  87.4 10/05/2023   PLT 191.0 10/05/2023   Lab Results  Component Value Date   NA 140 06/16/2024   K 4.1 06/16/2024   CO2 22 06/16/2024   GLUCOSE 106 (H) 06/16/2024   BUN 8 06/16/2024   CREATININE 1.11 06/16/2024   BILITOT 0.7 10/05/2023   ALKPHOS 94 10/05/2023   AST 19 10/05/2023   ALT 19 10/05/2023   PROT 7.4 10/05/2023   ALBUMIN 4.3 10/05/2023   CALCIUM  9.5 06/16/2024   ANIONGAP 12 07/27/2023   EGFR 84 06/16/2024    GFR 73.20 10/05/2023   Lab Results  Component Value Date   CHOL 171 10/05/2023   Lab Results  Component Value Date   HDL 32.30 (L) 10/05/2023   Lab Results  Component Value Date   LDLCALC 71 09/10/2021   Lab Results  Component Value Date   TRIG (H) 10/05/2023    427.0 Triglyceride is over 400; calculations on Lipids are invalid.   Lab Results  Component Value Date   CHOLHDL 5 10/05/2023   Lab Results  Component Value Date   HGBA1C 7.6 (A) 07/28/2024      Assessment & Plan:  Type 2 diabetes mellitus with diabetic neuropathy, without long-term current use of insulin  [facility]) Assessment & Plan: Reports neuropathy symptoms. On insulin  regimen. Inconsistent glucose monitoring. Discussed potential switch to oral medication pending CGM data. Lab Results  Component Value Date   HGBA1C 7.6 (A) 07/28/2024   -Review CGM data in one month to consider switching from mealtime insulin  to oral medication. - Continue long-acting insulin  at night. - Consider oral medications  Orders: -     Microalbumin / creatinine urine ratio -     Gabapentin ; Take 1 capsule (300 mg total) by mouth at bedtime.  Dispense: 30 capsule; Refill: 2 -     [employer] 3 Sensor; Place 1 sensor on the skin every 14 days. Use to check glucose continuously  Dispense: 2 each; Refill: 3 -     POCT glycosylated hemoglobin (Hb A1C)  Need for immunization against influenza -     Flu vaccine trivalent PF, 6mos and older(Flulaval,Afluria,Fluarix,Fluzone)  Essential hypertension Assessment & Plan: Hypertension managed with amlodipine , no recent issues. - Refill amlodipine  prescription. - Bring medications or pictures of medications to next appointment for review.  Orders: -     amLODIPine  Besylate; Take 1 tablet (5 mg total) by mouth daily.  Dispense: 90 tablet; Refill: 3  Hypertriglyceridemia Assessment & Plan: Chronic on pravastatin  and fenofibrate .  -Continue pravastatin  and fenofibrate . -Follow  lipid panel      Follow-up: Return in about 1 month (around 08/27/2024) for diabetes.   Marigny Borre, NP

## 2024-07-28 NOTE — Telephone Encounter (Signed)
New prior auth submitted

## 2024-07-31 ENCOUNTER — Encounter: Payer: Self-pay | Admitting: Cardiology

## 2024-07-31 ENCOUNTER — Other Ambulatory Visit: Payer: Self-pay

## 2024-07-31 ENCOUNTER — Ambulatory Visit: Attending: Cardiology | Admitting: Cardiology

## 2024-07-31 VITALS — BP 138/96 | HR 72 | Ht 68.0 in | Wt 210.4 lb

## 2024-07-31 DIAGNOSIS — I4510 Unspecified right bundle-branch block: Secondary | ICD-10-CM | POA: Diagnosis not present

## 2024-07-31 DIAGNOSIS — E785 Hyperlipidemia, unspecified: Secondary | ICD-10-CM | POA: Diagnosis not present

## 2024-07-31 DIAGNOSIS — I10 Essential (primary) hypertension: Secondary | ICD-10-CM | POA: Insufficient documentation

## 2024-07-31 DIAGNOSIS — R0602 Shortness of breath: Secondary | ICD-10-CM | POA: Insufficient documentation

## 2024-07-31 DIAGNOSIS — Z6831 Body mass index (BMI) 31.0-31.9, adult: Secondary | ICD-10-CM | POA: Diagnosis not present

## 2024-07-31 DIAGNOSIS — Z794 Long term (current) use of insulin: Secondary | ICD-10-CM | POA: Diagnosis not present

## 2024-07-31 DIAGNOSIS — E1140 Type 2 diabetes mellitus with diabetic neuropathy, unspecified: Secondary | ICD-10-CM | POA: Diagnosis not present

## 2024-07-31 DIAGNOSIS — R079 Chest pain, unspecified: Secondary | ICD-10-CM | POA: Insufficient documentation

## 2024-07-31 DIAGNOSIS — E66811 Obesity, class 1: Secondary | ICD-10-CM | POA: Diagnosis not present

## 2024-07-31 DIAGNOSIS — G4733 Obstructive sleep apnea (adult) (pediatric): Secondary | ICD-10-CM | POA: Diagnosis not present

## 2024-07-31 MED ORDER — OMEPRAZOLE 20 MG PO CPDR
20.0000 mg | DELAYED_RELEASE_CAPSULE | Freq: Every day | ORAL | 11 refills | Status: AC
  Filled 2024-07-31: qty 30, 30d supply, fill #0
  Filled 2024-10-02: qty 30, 30d supply, fill #1

## 2024-07-31 NOTE — Telephone Encounter (Signed)
 Pharmacy Patient Advocate Encounter  Received notification from [employer] that Prior Authorization for [facility] [address] has been DENIED.  See denial reason below. No denial letter attached in CMM. Will attach denial letter to Media tab once received.   PA #/Case ID/Reference #: --

## 2024-07-31 NOTE — Patient Instructions (Signed)
 Medication Instructions:  Your physician recommends the following medication changes.  START TAKING: Omeprazole 20 mg daily  *If you need a refill on your cardiac medications before your next appointment, please call your pharmacy*  Lab Work: No labs ordered today  If you have labs (blood work) drawn today and your tests are completely normal, you will receive your results only by: MyChart Message (if you have MyChart) OR A paper copy in the mail If you have any lab test that is abnormal or we need to change your treatment, we will call you to review the results.  Testing/Procedures: No test ordered today   Follow-Up: At [facility], you and your health needs are our priority.  As part of our continuing mission to provide you with exceptional heart care, our providers are all part of one team.  This team includes your primary Cardiologist (physician) and Advanced Practice Providers or APPs (Physician Assistants and Nurse Practitioners) who all work together to provide you with the care you need, when you need it.  Your next appointment:   3 month(s)  Provider:   You may see one of the following Advanced Practice Providers on your designated Care Team:   Lonni Meager, NP Lesley Maffucci, PA-C Bernardino Bring, PA-C Cadence Harrells, PA-C Tylene Lunch, NP Barnie Hila, NP

## 2024-07-31 NOTE — Progress Notes (Signed)
 Cardiology Office Note   Date:  07/31/2024  ID:  LINFORD QUINTELA, DOB 1981/08/10, MRN [id number] PCP: Vincente Saber, NP   [facility] Providers Cardiologist:  None Cardiology APP:  Gerard Frederick, NP     History of Present Illness Luke [name] is a 43 y.o. male with a past medical history of hypertension, gastroesophageal reflux disease, diverticulitis of the colon with prior perforation, type 2 diabetes, peripheral polyneuropathy, iron  deficiency anemia, dyslipidemia, OSA on CPAP, who presents today for follow-up.   Patient previously followed with his pulmonologist after reporting difficulty using CPAP therapy due to dry mouth. He was last seen in clinic 05/09/2024. During that visit he also reported having difficulty at night and having chest pain with difficulty falling back to sleep while using his CPAP. He had a family history that includes coronary artery disease in his maternal grandmother, heart disease in his mother, and hypertension in his mother and father. He also stated that he had quit smoking approximately 4 years ago. He is smoking included cigarettes but he was exposed to tobacco smoke. Never used smokeless tobacco. Reported current alcohol use of about 2 standard drinks of alcohol per week. He also stated that he had previously used marijuana but was not currently using any drugs. He was counseled on increasing CPAP compliance significantly for mask discomfort his mask was changed. For his nocturnal chest discomfort he was referred to cardiology for further evaluation.    He was last seen in clinic 06/16/2024 to establish care with complaints of nocturnal chest pain concerning for unstable angina.  He had previously followed up with pulmonary and was having chest discomfort with exertion as well as progressive shortness of breath.  He was scheduled for coronary CTA to rule out any ischemic causes as well as an echocardiogram to rule out structural abnormalities.  He  returns to clinic today accompanied by family member.  Blood pressure slightly elevated this morning he is just taking his medication upon arrival.  Stated that he does not suffer from chest discomfort last evening after eating pizza.  According to family member that accompanies him today any type of acidic food causes chest discomfort and further discussion he tends to have these symptoms after eating any type of acidic food.  He often eats and then will go right to bed which often causes the discomfort in his chest throughout the night.  It often leads to him being unable to tolerate his CPAP throughout the night as well.  He states that he has been compliant with his current medication with any undue side effects.  Denies any hospitalizations or visits to the emergency department.  ROS: 10 point review of system has been reviewed and considered negative except ones been listed in the HPI  Studies Reviewed EKG Interpretation Date/Time:  Monday July 31 2024 08:03:47 EDT Ventricular Rate:  72 PR Interval:  170 QRS Duration:  154 QT Interval:  440 QTC Calculation: 481 R Axis:   27  Text Interpretation: Normal sinus rhythm Right bundle branch block When compared with ECG of 16-Jun-2024 08:45, No significant change was found Confirmed by Gerard Frederick ([id number]) on 07/31/2024 8:06:30 AM    2d echo 07/20/2024 1. Left ventricular ejection fraction, by estimation, is 60 to 65%. The  left ventricle has normal function. The left ventricle has no regional  wall motion abnormalities. There is mild left ventricular hypertrophy.  Left ventricular diastolic parameters  are consistent with Grade I diastolic dysfunction (impaired relaxation).   2.  Right ventricular systolic function is normal. The right ventricular  size is normal. Tricuspid regurgitation signal is inadequate for assessing  PA pressure.   3. The mitral valve is normal in structure. No evidence of mitral valve  regurgitation. No evidence  of mitral stenosis.   4. The aortic valve is tricuspid. Aortic valve regurgitation is not  visualized. No aortic stenosis is present.   5. There is borderline dilatation of the ascending aorta, measuring 39  mm.   6. The inferior vena cava is normal in size with greater than 50%  respiratory variability, suggesting right atrial pressure of 3 mmHg.   cCTA 07/06/2024 IMPRESSION: 1. Coronary calcium  score of 10.2.   2. Normal coronary origin with right dominance.   3. Minimal LAD and LCx calcifications (<25%).   4. CAD-RADS 1. Minimal non-obstructive CAD (0-24%). Consider non-atherosclerotic causes of chest pain. Consider preventive therapy and risk factor modification.  Risk Assessment/Calculations   HYPERTENSION CONTROL Vitals:   07/31/24 0756 07/31/24 0810  BP: (!) 144/100 (!) 138/96    The patient's blood pressure is elevated above target today.  In order to address the patient's elevated BP: Blood pressure will be monitored at home to determine if medication changes need to be made. (Had just taken medications prior to arrival)          Physical Exam VS:  BP (!) 138/96 (BP Location: Left Arm, Patient Position: Sitting, Cuff Size: Normal)   Pulse 72   Ht 5' 8 (1.727 m)   Wt 210 lb 6 oz (95.4 kg)   SpO2 96%   BMI 31.99 kg/m        Wt Readings from Last 3 Encounters:  07/31/24 210 lb 6 oz (95.4 kg)  07/28/24 208 lb 9.6 oz (94.6 kg)  06/30/24 207 lb 3.2 oz (94 kg)    GEN: Well nourished, well developed in no acute distress NECK: No JVD; No carotid bruits CARDIAC: RRR, no murmurs, rubs, gallops RESPIRATORY:  Clear to auscultation without rales, wheezing or rhonchi  ABDOMEN: Soft, non-tender, non-distended EXTREMITIES:  No edema; No deformity   ASSESSMENT AND PLAN Atypical chest pain that is primarily nocturnal and is related to acidic foods and going to bed primarily after eating.  He recently underwent a coronary CTA which revealed a calcium  score of 10.2 which  revealed minimal nonobstructive coronary artery disease and the recommendation was to consider not atherosclerotic causes of chest pain and preventative therapy with risk factor modification.  EKG today reveals sinus rhythm with a rate of 72 with a chronic right bundle branch block with T wave inversions with no acute ischemic change noted.  He is maintained on statin therapy for dyslipidemia.  For his ongoing discomfort sounding to be gastroesophageal in nature he is being started on 20 mg of omeprazole daily.  Shortness of breath that has been ongoing and progressive.  He states that he has previously advised when he was diagnosed with colon cancer that he had heart failure but had no escalation of GDMT and had no testing completed.  Echocardiogram recently completed revealed an LVEF of 60 to 65%, no RWMA, G1 DD, no valvular abnormalities were noted with borderline dilatation of the ascending aorta measuring 39 mm.  Follow-up with surveillance echoes.  Encouraged blood pressure control.  Hypertension with blood pressure 140/100 manage of 139/95.  Continued on amlodipine  5 mg daily.  He had just taken his blood pressure medication when he arrived to his appointment today.  On chart review his  blood pressure running in the 120s.  He has been encouraged to follow a low-sodium diet and maintain adequate control of blood pressure.  He is to continue to be encouraged to monitor pressures 1 to 2 hours postmedication administration as well.  Dyslipidemia with labs drawn by PCP last week.  He has continued on fenofibrate  and pravastatin  80 mg daily.  Ongoing management per PCP.  Right bundle branch block noted initially on EKG in September 2024.  Will continue to monitor with surveillance studies.  Type 2 diabetes he has continued on Jardiance , metformin , and insulin  therapy.  Ongoing management per PCP.  Obstructive sleep apnea with CPAP ongoing management per pulmonary with compliance encouraged.  Obesity  with a BMI of 31.99.  Would benefit from weight loss.  With longstanding history of diabetes encouraged to discuss GLP-1 with PCP.       Dispo: Patient to return to clinic as MD/APP in 3 months or sooner if needed for further evaluation  Signed, Evin Loiseau, NP

## 2024-08-07 ENCOUNTER — Other Ambulatory Visit: Payer: Self-pay

## 2024-08-10 NOTE — Assessment & Plan Note (Signed)
 Chronic on pravastatin  and fenofibrate .  -Continue pravastatin  and fenofibrate . -Follow lipid panel

## 2024-08-10 NOTE — Assessment & Plan Note (Signed)
 Hypertension managed with amlodipine , no recent issues. - Refill amlodipine  prescription. - Bring medications or pictures of medications to next appointment for review.

## 2024-08-10 NOTE — Assessment & Plan Note (Signed)
 Reports neuropathy symptoms. On insulin  regimen. Inconsistent glucose monitoring. Discussed potential switch to oral medication pending CGM data. Lab Results  Component Value Date   HGBA1C 7.6 (A) 07/28/2024   -Review CGM data in one month to consider switching from mealtime insulin  to oral medication. - Continue long-acting insulin  at night. - Consider oral medications

## 2024-08-11 NOTE — Telephone Encounter (Signed)
 Can we please appeal it. The pt is willing to use freestyle libre not insulin  pump.

## 2024-08-14 ENCOUNTER — Other Ambulatory Visit: Payer: Self-pay

## 2024-08-25 ENCOUNTER — Ambulatory Visit: Admitting: Nurse Practitioner

## 2024-08-30 NOTE — Telephone Encounter (Signed)
 I have sent to the appeals team.

## 2024-08-31 ENCOUNTER — Telehealth: Payer: Self-pay | Admitting: Pharmacist

## 2024-08-31 ENCOUNTER — Other Ambulatory Visit (HOSPITAL_COMMUNITY): Payer: Self-pay

## 2024-08-31 NOTE — Telephone Encounter (Signed)
 The Free Style Libre 3 PLUS sensors have been approved by the insurance through 03/01/2025.  The Free Style Herlene 3 has been replaced by the PLUS!  Thank you, Devere Pandy, PharmD Clinical Pharmacist  [place]  Direct Dial : [phone]

## 2024-08-31 NOTE — Telephone Encounter (Signed)
 Attempted to call the Patient no answer and voicemail has not been set up yet.

## 2024-09-01 NOTE — Telephone Encounter (Signed)
 Called pt  no vm unable to lm

## 2024-09-04 NOTE — Telephone Encounter (Signed)
 Attempted to call Patient -no answer and voicemail has not been set up

## 2024-09-04 NOTE — Telephone Encounter (Signed)
 Called Patient no answer and voicemail has not been set up.

## 2024-09-08 NOTE — Telephone Encounter (Signed)
Attempted to call Patient- no answer and voicemail has not been set up yet 

## 2024-09-08 NOTE — Telephone Encounter (Signed)
 Mailed Patient a letter to his house.

## 2024-09-20 ENCOUNTER — Ambulatory Visit: Attending: Sleep Medicine

## 2024-09-20 DIAGNOSIS — G478 Other sleep disorders: Secondary | ICD-10-CM | POA: Diagnosis not present

## 2024-09-20 DIAGNOSIS — G4736 Sleep related hypoventilation in conditions classified elsewhere: Secondary | ICD-10-CM | POA: Insufficient documentation

## 2024-09-20 DIAGNOSIS — G4733 Obstructive sleep apnea (adult) (pediatric): Secondary | ICD-10-CM | POA: Diagnosis not present

## 2024-09-22 DIAGNOSIS — R0902 Hypoxemia: Secondary | ICD-10-CM

## 2024-10-02 ENCOUNTER — Other Ambulatory Visit: Payer: Self-pay

## 2024-10-13 ENCOUNTER — Ambulatory Visit: Admitting: Sleep Medicine

## 2024-10-13 ENCOUNTER — Encounter: Payer: Self-pay | Admitting: Sleep Medicine

## 2024-10-13 VITALS — BP 110/80 | HR 90 | Temp 98.0°F | Ht 68.0 in | Wt 203.4 lb

## 2024-10-13 DIAGNOSIS — E669 Obesity, unspecified: Secondary | ICD-10-CM

## 2024-10-13 DIAGNOSIS — I10 Essential (primary) hypertension: Secondary | ICD-10-CM | POA: Diagnosis not present

## 2024-10-13 DIAGNOSIS — G4733 Obstructive sleep apnea (adult) (pediatric): Secondary | ICD-10-CM

## 2024-10-13 DIAGNOSIS — R0602 Shortness of breath: Secondary | ICD-10-CM

## 2024-10-13 DIAGNOSIS — Z6830 Body mass index (BMI) 30.0-30.9, adult: Secondary | ICD-10-CM | POA: Diagnosis not present

## 2024-10-13 DIAGNOSIS — R0609 Other forms of dyspnea: Secondary | ICD-10-CM | POA: Diagnosis not present

## 2024-10-13 NOTE — Progress Notes (Signed)
 "      Name:Luke [name] MRN: [id number] DOB: [birth year]   CHIEF COMPLAINT:  PAP F/U   HISTORY OF PRESENT ILLNESS: Luke [name] is a 43 y.o. w/ a h/o OSA, HTN, DMII, hyperlipidemia and obesity who presents for CPAP F/U visit. Reports using CPAP therapy every night. Also reports frequent dyspnea on exertion. Reports that he is currently using the Airfit P10 nasal mask, which is comfortable.   Due to elevated AHI on CPAP therapy, PAP titration study was completed which revealed that patient was titrated to BIPAP at 19/14 cm H2O.    EPWORTH SLEEP SCORE    02/08/2024    8:40 AM 08/09/2023    1:00 PM  Results of the Epworth flowsheet  Sitting and reading 3 3  Watching TV 3 3  Sitting, inactive in a public place (e.g. a theatre or a meeting) 2 3  As a passenger in a car for an hour without a break 3 3  Lying down to rest in the afternoon when circumstances permit 3 3  Sitting and talking to someone 3 1  Sitting quietly after a lunch without alcohol 3 3  In a car, while stopped for a few minutes in traffic 2 0  Total score 22 19    PAST MEDICAL HISTORY :   has a past medical history of Anemia, B12 deficiency, Cecum mass (08/15/2018), Chronic kidney disease, Colonic neoplasm, Columnar-lined esophagus, Diverticulosis of large intestine without diverticulitis, Dyslipidemia (high LDL; low HDL), Gastric polyp, GERD (gastroesophageal reflux disease), Hyperlipidemia, Hyperlipidemia associated with type 2 diabetes mellitus (HCC), Hypertension, Peripheral polyneuropathy, Pneumonia (09/07/2016), Sleep apnea, Stomach irritation, Type 2 diabetes mellitus treated with insulin  (HCC), and Unilateral inguinal hernia, without obstruction or gangrene, not specified as recurrent.  has a past surgical history that includes Colonoscopy with propofol  (N/A, 07/28/2018); Esophagogastroduodenoscopy (egd) with propofol  (N/A, 07/28/2018); Partial colectomy (Right, 08/15/2018); Colon surgery; Colonoscopy with propofol   (N/A, 10/18/2019); Colonoscopy with propofol  (N/A, 02/17/2021); XI Robotic assisted inguinal hernia repair with mesh (Right, 01/07/2023); Ventral hernia repair (N/A, 08/17/2023); and Colonoscopy with propofol  (N/A, 11/24/2023). Prior to Admission medications   Medication Sig Start Date End Date Taking? Authorizing Provider  amLODipine  (NORVASC ) 5 MG tablet Take 1 tablet (5 mg total) by mouth daily. 07/26/23 07/16/24 Yes Vincente Saber, NP  blood glucose meter kit and supplies KIT Dispense based on patient and insurance preference. Use up to four times daily as directed. (FOR ICD-9 250.00, 250.01). 04/04/20  Yes Iloabachie, Chioma E, NP  empagliflozin  (JARDIANCE ) 10 MG TABS tablet Take 1 tablet (10 mg total) by mouth every morning. 11/26/23  Yes Vincente Saber, NP  fenofibrate  54 MG tablet Take 1 tablet (54 mg total) by mouth daily. 10/07/23  Yes Kaur, Charanpreet, NP  gabapentin  (NEURONTIN ) 300 MG capsule Take 1 capsule (300 mg total) by mouth at bedtime. 01/27/24 06/30/24 Yes Kaur, Charanpreet, NP  insulin  glargine (LANTUS ) 100 unit/mL SOPN Inject 40 Units into the skin at bedtime. Patient taking differently: Inject 45 Units into the skin at bedtime. 11/04/23  Yes Vincente Saber, NP  insulin  lispro (HUMALOG ) 100 UNIT/ML KwikPen Inject 10 Units into the skin 3 (three) times daily. Patient taking differently: Inject 14 Units into the skin 3 (three) times daily. 03/31/24  Yes Vincente Saber, NP  Insulin  Pen Needle (NOVOFINE PEN NEEDLE) 32G X 6 MM MISC use as directed 01/27/22  Yes Margrette Lauth K, RPH  Insulin  Pen Needle 32G X 4 MM MISC USE AS DIRECTED WITH INSULIN . 01/27/22  Yes Margrette,  Keri K, RPH  metoprolol  tartrate (LOPRESSOR ) 100 MG tablet Take 1 tablet (100 mg total) by mouth once for 1 dose. Take 90 - 120 min prior to procedure 06/16/24 06/30/24 Yes Hammock, Sheri, NP  pravastatin  (PRAVACHOL ) 80 MG tablet Take 1 tablet (80 mg total) by mouth daily. 01/03/24 07/03/24 Yes Vincente Saber, NP  metFORMIN   (GLUCOPHAGE ) 850 MG tablet Take 1 tablet (850 mg total) by mouth 2 (two) times daily with a meal. 12/18/20 12/19/20  Iloabachie, Chioma E, NP   No Known Allergies  FAMILY HISTORY:  family history includes Arthritis in his father; Breast cancer in his maternal grandmother and mother; CAD in his maternal grandmother; Cancer in his mother; Colon cancer in his paternal uncle; Diabetes in his mother and paternal grandmother; Heart disease in his mother; Hypertension in his father and mother; Throat cancer in his maternal grandfather. SOCIAL HISTORY:  reports that he quit smoking about 4 years ago. His smoking use included cigarettes. He smoked an average of 0.1 packs per day. He has been exposed to tobacco smoke. He has never used smokeless tobacco. He reports current alcohol use of about 2.0 standard drinks of alcohol per week. He reports that he does not currently use drugs after having used the following drugs: Marijuana.   Review of Systems:  Gen:  Denies  fever, sweats, chills weight loss  HEENT: Denies blurred vision, double vision, ear pain, eye pain, hearing loss, nose bleeds, sore throat Cardiac:  No dizziness, chest pain or heaviness, chest tightness,edema, No JVD Resp:   No cough, -sputum production, -shortness of breath,-wheezing, -hemoptysis,  Gi: Denies swallowing difficulty, stomach pain, nausea or vomiting, diarrhea, constipation, bowel incontinence Gu:  Denies bladder incontinence, burning urine Ext:   Denies Joint pain, stiffness or swelling Skin: Denies  skin rash, easy bruising or bleeding or hives Endoc:  Denies polyuria, polydipsia , polyphagia or weight change Psych:   Denies depression, insomnia or hallucinations  Other:  All other systems negative  VITAL SIGNS: BP 110/80   Pulse 90   Temp 98 F (36.7 C)   Ht 5' 8 (1.727 m)   Wt 203 lb 6.4 oz (92.3 kg)   SpO2 96%   BMI 30.93 kg/m    Physical Examination:   General Appearance: No distress  EYES PERRLA, EOM  intact.   NECK Supple, No JVD Pulmonary: normal breath sounds, No wheezing.  CardiovascularNormal S1,S2.  No m/r/g.   Abdomen: Benign, Soft, non-tender. Skin:   warm, no rashes, no ecchymosis  Extremities: normal, no cyanosis, clubbing. Neuro:without focal findings,  speech normal  PSYCHIATRIC: Mood, affect within normal limits.   ASSESSMENT AND PLAN  OSA Reviewed PAP titration study results with patient. Will complete RX for BIPAP set to Discussed the consequences of untreated sleep apnea. Advised not to drive drowsy for safety of patient and others. Will follow up in 3 months.    HTN Stable, on current management. Following with PCP.   Obesity Counseled patient on diet and lifestyle modification.   DOE Will complete referral to pulmonary for further eval and management.    Patient  satisfied with Plan of action and management. All questions answered  I spent a total of 24 minutes reviewing chart data, face-to-face evaluation with the patient, counseling and coordination of care as detailed above.    Sonny Poth, M.D.  Sleep Medicine [facility]        "

## 2024-10-19 ENCOUNTER — Ambulatory Visit: Admitting: Student in an Organized Health Care Education/Training Program

## 2024-10-31 ENCOUNTER — Other Ambulatory Visit: Payer: Self-pay

## 2024-11-03 NOTE — Progress Notes (Deleted)
 " Cardiology Office Note   Date:  11/03/2024  ID:  Luke [name], DOB [birth year], MRN [id number] PCP: Vincente Saber, NP  [facility] Providers Cardiologist:  None Cardiology APP:  Gerard Frederick, NP { Click to update primary MD,subspecialty MD or APP then REFRESH:1}    History of Present Illness Luke [name] is a 44 y.o. male with a past medical history of hypertension, gastroesophageal reflux disease, diverticulitis of the colon with prior perforation, type 2 diabetes, peripheral polyneuropathy, iron  deficiency anemia, dyslipidemia, OSA on CPAP, who presents today for follow-up.   Patient previously followed with his pulmonologist after reporting difficulty using CPAP therapy due to dry mouth. He was last seen in clinic 05/09/2024. During that visit he also reported having difficulty at night and having chest pain with difficulty falling back to sleep while using his CPAP. He had a family history that includes coronary artery disease in his maternal grandmother, heart disease in his mother, and hypertension in his mother and father. He also stated that he had quit smoking approximately 4 years ago. He is smoking included cigarettes but he was exposed to tobacco smoke. Never used smokeless tobacco. Reported current alcohol use of about 2 standard drinks of alcohol per week. He also stated that he had previously used marijuana but was not currently using any drugs. He was counseled on increasing CPAP compliance significantly for mask discomfort his mask was changed. For his nocturnal chest discomfort he was referred to cardiology for further evaluation.   He previously been seen in clinic 06/16/2024 to establish care with complaints of nocturnal chest pain concerning for unstable angina.  He had previously followed up with pulmonary and was having chest discomfort with exertion as well as progressive shortness of breath.  He was scheduled for coronary CTA to rule out any ischemic causes as  well as an echocardiogram to rule out structural abnormalities.  He was last seen in clinic 07/31/2024 accompanied by family member.  Blood pressure was noted to be slightly elevated in the morning after just taking his medication upon arrival.  He states he did suffer from chest discomfort last evening after eating pizza.  According family member that accompanies him today any type of acidic food caused chest discomfort and in further discussion he tends to have the symptoms with eating any type of acidic foods.  He often eats and then will go straight to bed which often causes discomfort in his chest throughout the night.  Cardiogram revealed a normal LVEF without significant valvular abnormalities coronary CTA revealed a calcium  score of 10.2 with minimal nonobstructive CAD.  He was started on PPI therapy for GERD symptoms.  He was also encouraged to keep a blood pressure log of his blood pressures at home on a regular basis 1 to 2 hours postmedication administration.   He returns to clinic today  ROS: 10 point review of system has been reviewed and considered negative the exception was been listed in the HPI  Studies Reviewed      2d echo 07/20/2024 1. Left ventricular ejection fraction, by estimation, is 60 to 65%. The  left ventricle has normal function. The left ventricle has no regional  wall motion abnormalities. There is mild left ventricular hypertrophy.  Left ventricular diastolic parameters  are consistent with Grade I diastolic dysfunction (impaired relaxation).   2. Right ventricular systolic function is normal. The right ventricular  size is normal. Tricuspid regurgitation signal is inadequate for assessing  PA pressure.   3.  The mitral valve is normal in structure. No evidence of mitral valve  regurgitation. No evidence of mitral stenosis.   4. The aortic valve is tricuspid. Aortic valve regurgitation is not  visualized. No aortic stenosis is present.   5. There is borderline  dilatation of the ascending aorta, measuring 39  mm.   6. The inferior vena cava is normal in size with greater than 50%  respiratory variability, suggesting right atrial pressure of 3 mmHg.    cCTA 07/06/2024 IMPRESSION: 1. Coronary calcium  score of 10.2.   2. Normal coronary origin with right dominance.   3. Minimal LAD and LCx calcifications (<25%).   4. CAD-RADS 1. Minimal non-obstructive CAD (0-24%). Consider non-atherosclerotic causes of chest pain. Consider preventive therapy and risk factor modification.  Risk Assessment/Calculations   No BP recorded.  {Refresh Note OR Click here to enter BP  :1}***       Physical Exam VS:  There were no vitals taken for this visit.       Wt Readings from Last 3 Encounters:  10/13/24 203 lb 6.4 oz (92.3 kg)  07/31/24 210 lb 6 oz (95.4 kg)  07/28/24 208 lb 9.6 oz (94.6 kg)    GEN: Well nourished, well developed in no acute distress NECK: No JVD; No carotid bruits CARDIAC: ***RRR, no murmurs, rubs, gallops RESPIRATORY:  Clear to auscultation without rales, wheezing or rhonchi  ABDOMEN: Soft, non-tender, non-distended EXTREMITIES:  No edema; No deformity   ASSESSMENT AND PLAN Mild nonobstructive coronary artery disease noted on coronary CTA with calcium  score 10.2 Chronic shortness of breath Hypertension Dyslipidemia Type 2 diabetes Obstructive sleep apnea on CPAP with ongoing management per pulmonary     {Are you ordering a CV Procedure (e.g. stress test, cath, DCCV, TEE, etc)?   Press F2        :[id number]}  Dispo: ***  Signed, Murrel Freet, NP   "

## 2024-11-07 ENCOUNTER — Ambulatory Visit: Attending: Cardiology | Admitting: Cardiology

## 2024-11-08 ENCOUNTER — Other Ambulatory Visit: Payer: Self-pay

## 2024-11-08 ENCOUNTER — Other Ambulatory Visit: Payer: Self-pay | Admitting: Nurse Practitioner

## 2024-11-08 DIAGNOSIS — E1140 Type 2 diabetes mellitus with diabetic neuropathy, unspecified: Secondary | ICD-10-CM

## 2024-11-08 MED ORDER — INSULIN LISPRO (1 UNIT DIAL) 100 UNIT/ML (KWIKPEN)
10.0000 [IU] | PEN_INJECTOR | Freq: Three times a day (TID) | SUBCUTANEOUS | 2 refills | Status: AC
  Filled 2024-11-08: qty 9, 30d supply, fill #0

## 2024-11-08 MED ORDER — INSULIN GLARGINE 100 UNITS/ML SOLOSTAR PEN
40.0000 [IU] | PEN_INJECTOR | Freq: Every day | SUBCUTANEOUS | 11 refills | Status: AC
  Filled 2024-11-08: qty 15, 37d supply, fill #0

## 2024-11-09 ENCOUNTER — Emergency Department
Admission: EM | Admit: 2024-11-09 | Discharge: 2024-11-09 | Disposition: A | Discharge status: Home / Self Care | Attending: Emergency Medicine | Admitting: Emergency Medicine

## 2024-11-09 ENCOUNTER — Other Ambulatory Visit: Payer: Self-pay

## 2024-11-09 DIAGNOSIS — I10 Essential (primary) hypertension: Secondary | ICD-10-CM | POA: Insufficient documentation

## 2024-11-09 DIAGNOSIS — J040 Acute laryngitis: Secondary | ICD-10-CM | POA: Insufficient documentation

## 2024-11-09 DIAGNOSIS — J029 Acute pharyngitis, unspecified: Secondary | ICD-10-CM | POA: Diagnosis present

## 2024-11-09 DIAGNOSIS — E1140 Type 2 diabetes mellitus with diabetic neuropathy, unspecified: Secondary | ICD-10-CM | POA: Diagnosis not present

## 2024-11-09 NOTE — ED Triage Notes (Addendum)
 Pt comes with hoarseness. Pt states his brother has been sick. Pt denies any pain or other symptoms. Work sent him here to get cleared to come back to work.

## 2024-11-09 NOTE — Discharge Instructions (Signed)
 Your exam is normal and reassuring.  Take your home meds as prescribed.  Consider OTC throat lozenges as needed.  Follow-up with your primary provider for ongoing evaluation.  If symptoms worsen or fevers develop, consider OTC testing for COVID and flu.  Return to the ED if necessary.

## 2024-11-09 NOTE — ED Provider Notes (Signed)
 "   [facility] Emergency Department Provider Note     Event Date/Time   First MD Initiated Contact with Patient 11/09/24 1741     (approximate)   History   Sore Throat   HPI  Luke [name] is a 44 y.o. male with a history of HTN, HLD, DM type II, GERD, OSA, peripheral neuropathy, presents to the ED for evaluation of hoarseness.  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  His employer asked for him to be evaluated and receive a return to work before presenting back.  Patient denies any interim fevers, chills, sweats, chest pain, or shortness of breath.  Physical Exam   Triage Vital Signs: ED Triage Vitals  Encounter Vitals Group     BP 11/09/24 1716 (!) 157/141     Girls Systolic BP Percentile --      Girls Diastolic BP Percentile --      Boys Systolic BP Percentile --      Boys Diastolic BP Percentile --      Pulse Rate 11/09/24 1716 65     Resp 11/09/24 1716 18     Temp 11/09/24 1716 98 F (36.7 C)     Temp src --      SpO2 11/09/24 1716 98 %     Weight 11/09/24 1717 200 lb (90.7 kg)     Height 11/09/24 1717 5' 8 (1.727 m)     Head Circumference --      Peak Flow --      Pain Score 11/09/24 1716 0     Pain Loc --      Pain Education --      Exclude from Growth Chart --     Most recent vital signs: Vitals:   11/09/24 1716  BP: (!) 157/141  Pulse: 65  Resp: 18  Temp: 98 F (36.7 C)  SpO2: 98%    General Awake, no distress. NAD HEENT NCAT. PERRL. EOMI. No rhinorrhea. Mucous membranes are moist.  CV:  Good peripheral perfusion. RRR RESP:  Normal effort. CTA ABD:  No distention.    ED Results / Procedures / Treatments   Labs (all labs ordered are listed, but only abnormal results are displayed) Labs Reviewed - No data to display   EKG   RADIOLOGY  PROCEDURES:  Critical Care performed: No  Procedures   MEDICATIONS ORDERED  IN ED: Medications - No data to display   IMPRESSION / MDM / ASSESSMENT AND PLAN / ED COURSE  I reviewed the triage vital signs and the nursing notes.                              Differential diagnosis includes, but is not limited to, COVID, flu, RSV, viral URI, laryngitis, postnasal drip, GERD  Patient's presentation is most consistent with acute, uncomplicated illness.  Patient's diagnosis is consistent with laryngitis.  Patient is denied any fevers, chills, sweats.  No cough or congestion.  He reports he did not take his blood pressure medicine this morning, but denies any headache or chest discomfort.  Symptoms likely represent laryngitis of a viral etiology.  Patient is declining COVID and flu testing at this time.  Patient will be discharged home with instructions to take OTC meds as needed for hoarseness. Patient is to follow up with his PCP as  suggested, as needed or otherwise directed. Patient is given ED precautions to return to the ED for any worsening or new symptoms.  A work note is provided as requested.    FINAL CLINICAL IMPRESSION(S) / ED DIAGNOSES   Final diagnoses:  Laryngitis     Rx / DC Orders   ED Discharge Orders     None        Note:  This document was prepared using Dragon voice recognition software and may include unintentional dictation errors.    Loyd Candida LULLA Aldona, PA-C 11/09/24 CATHLYN Jacolyn Pae, MD 11/09/24 2110  "

## 2024-11-09 NOTE — ED Notes (Addendum)
 Patient was discharged, but left without papers. Attempted to call patient, but patient did not answer and mailbox is full. Papers left with first nurse.

## 2024-11-13 ENCOUNTER — Ambulatory Visit: Payer: Self-pay | Admitting: Sleep Medicine

## 2024-11-15 ENCOUNTER — Ambulatory Visit: Admitting: Student in an Organized Health Care Education/Training Program

## 2024-11-22 ENCOUNTER — Emergency Department: Admission: EM | Admit: 2024-11-22 | Discharge: 2024-11-22 | Disposition: A | Discharge status: Home / Self Care

## 2024-11-22 ENCOUNTER — Emergency Department

## 2024-11-22 ENCOUNTER — Encounter: Payer: Self-pay | Admitting: Emergency Medicine

## 2024-11-22 DIAGNOSIS — R059 Cough, unspecified: Secondary | ICD-10-CM | POA: Diagnosis not present

## 2024-11-22 DIAGNOSIS — I129 Hypertensive chronic kidney disease with stage 1 through stage 4 chronic kidney disease, or unspecified chronic kidney disease: Secondary | ICD-10-CM | POA: Insufficient documentation

## 2024-11-22 DIAGNOSIS — J040 Acute laryngitis: Secondary | ICD-10-CM | POA: Diagnosis not present

## 2024-11-22 DIAGNOSIS — N189 Chronic kidney disease, unspecified: Secondary | ICD-10-CM | POA: Diagnosis not present

## 2024-11-22 DIAGNOSIS — J029 Acute pharyngitis, unspecified: Secondary | ICD-10-CM | POA: Diagnosis present

## 2024-11-22 LAB — COMPREHENSIVE METABOLIC PANEL WITH GFR
ALT: 13 U/L (ref 0–44)
AST: 15 U/L (ref 15–41)
Albumin: 4.5 g/dL (ref 3.5–5.0)
Alkaline Phosphatase: 98 U/L (ref 38–126)
Anion gap: 13 (ref 5–15)
BUN: 10 mg/dL (ref 6–20)
CO2: 26 mmol/L (ref 22–32)
Calcium: 9.9 mg/dL (ref 8.9–10.3)
Chloride: 100 mmol/L (ref 98–111)
Creatinine, Ser: 1.05 mg/dL (ref 0.61–1.24)
GFR, Estimated: >60 mL/min
Glucose, Bld: 78 mg/dL (ref 70–99)
Potassium: 3.4 mmol/L — ABNORMAL LOW (ref 3.5–5.1)
Sodium: 139 mmol/L (ref 135–145)
Total Bilirubin: 0.6 mg/dL (ref 0.0–1.2)
Total Protein: 7.9 g/dL (ref 6.5–8.1)

## 2024-11-22 LAB — CBC WITH DIFFERENTIAL/PLATELET
Abs Immature Granulocytes: 0.02 K/uL (ref 0.00–0.07)
Basophils Absolute: 0.1 K/uL (ref 0.0–0.1)
Basophils Relative: 1 %
Eosinophils Absolute: 0.1 K/uL (ref 0.0–0.5)
Eosinophils Relative: 1 %
HCT: 45.4 % (ref 39.0–52.0)
Hemoglobin: 15.1 g/dL (ref 13.0–17.0)
Immature Granulocytes: 0 %
Lymphocytes Relative: 42 %
Lymphs Abs: 3.9 K/uL (ref 0.7–4.0)
MCH: 27.7 pg (ref 26.0–34.0)
MCHC: 33.3 g/dL (ref 30.0–36.0)
MCV: 83.3 fL (ref 80.0–100.0)
Monocytes Absolute: 0.8 K/uL (ref 0.1–1.0)
Monocytes Relative: 9 %
Neutro Abs: 4.5 K/uL (ref 1.7–7.7)
Neutrophils Relative %: 47 %
Platelets: 234 K/uL (ref 150–400)
RBC: 5.45 MIL/uL (ref 4.22–5.81)
RDW: 13.6 % (ref 11.5–15.5)
WBC: 9.3 K/uL (ref 4.0–10.5)
nRBC: 0 % (ref 0.0–0.2)

## 2024-11-22 LAB — RESP PANEL BY RT-PCR (RSV, FLU A&B, COVID)  RVPGX2
Influenza A by PCR: NEGATIVE
Influenza B by PCR: NEGATIVE
Resp Syncytial Virus by PCR: NEGATIVE
SARS Coronavirus 2 by RT PCR: NEGATIVE

## 2024-11-22 LAB — GROUP A STREP BY PCR: Group A Strep by PCR: NOT DETECTED

## 2024-11-22 MED ORDER — IOHEXOL 300 MG/ML  SOLN
75.0000 mL | Freq: Once | INTRAMUSCULAR | Status: AC | PRN
  Administered 2024-11-22: 75 mL via INTRAVENOUS

## 2024-11-22 NOTE — ED Provider Notes (Signed)
 "  [facility] Provider Note    Event Date/Time   First MD Initiated Contact with Patient 11/22/24 1818     (approximate)   History   Sore Throat   HPI  Luke [name] is a 44 y.o. male with PMH of hypertension, CKD and cecum mass presents for evaluation of a sore throat.  Patient endorses cough, congestion, sore throat and voice hoarseness.  Patient was seen in the emergency department at the beginning of the month for the same complaint and diagnosed with laryngitis.  Patient reports no improvement.      Physical Exam   Triage Vital Signs: ED Triage Vitals  Encounter Vitals Group     BP 11/22/24 1802 (!) 137/121     Girls Systolic BP Percentile --      Girls Diastolic BP Percentile --      Boys Systolic BP Percentile --      Boys Diastolic BP Percentile --      Pulse Rate 11/22/24 1802 (!) 102     Resp 11/22/24 1802 16     Temp 11/22/24 1801 98.5 F (36.9 C)     Temp Source 11/22/24 1801 Oral     SpO2 11/22/24 1802 97 %     Weight 11/22/24 1802 200 lb 9.9 oz (91 kg)     Height 11/22/24 1802 5' 8 (1.727 m)     Head Circumference --      Peak Flow --      Pain Score 11/22/24 1802 5     Pain Loc --      Pain Education --      Exclude from Growth Chart --     Most recent vital signs: Vitals:   11/22/24 1802 11/22/24 1803  BP: (!) 137/121 (!) 135/103  Pulse: (!) 102 95  Resp: 16   Temp:    SpO2: 97%    General: Awake, no distress.  CV:  Good peripheral perfusion.  RRR. Resp:  Normal effort.  Lots of rhonchi bilaterally. Abd:  No distention.  Other:  Oral mucous membranes are moist, pharynx is erythematous, no tonsillar enlargement.   ED Results / Procedures / Treatments   Labs (all labs ordered are listed, but only abnormal results are displayed) Labs Reviewed  COMPREHENSIVE METABOLIC PANEL WITH GFR - Abnormal; Notable for the following components:      Result Value   Potassium 3.4 (*)    All other components within normal  limits  RESP PANEL BY RT-PCR (RSV, FLU A&B, COVID)  RVPGX2  GROUP A STREP BY PCR  CBC WITH DIFFERENTIAL/PLATELET    RADIOLOGY  CT soft tissue of the neck obtained, interpreted the images as well as reviewed the radiologist report.  Images did not show any visible mass but close glottis limited evaluation.  Radiologist recommended correlation with direct inspection.  There is moderate paranasal sinus mucosal thickening.  PROCEDURES:  Critical Care performed: No  Procedures   MEDICATIONS ORDERED IN ED: Medications  iohexol  (OMNIPAQUE ) 300 MG/ML solution 75 mL (75 mLs Intravenous Contrast Given 11/22/24 2211)     IMPRESSION / MDM / ASSESSMENT AND PLAN / ED COURSE  I reviewed the triage vital signs and the nursing notes.                             44 year old male presents for evaluation of sore throat and voice hoarseness.  Vital signs stable patient mildly uncomfortable on  exam.  Differential diagnosis includes, but is not limited to, viral URI, strep pharyngitis, flu, COVID, RSV, peritonsillar abscess, retropharyngeal abscess, other soft tissue mass.  Patient's presentation is most consistent with acute complicated illness / injury requiring diagnostic workup.  Given that patient's symptoms have persisted for 2 weeks and he has already been evaluated in the emergency department for this we will pursue more extensive workup.  If workup is reassuring will discharge patient with follow-up to PCP.  Lower suspicion for peritonsillar abscess or retropharyngeal abscess as patient does not have any trismus or tonsillar enlargement.  Greatest concern for possible soft tissue mass given patient's history of cancer.  Will obtain labs as well as CT soft tissue for further evaluation.  Workup is overall reassuring.  Recommended continued symptomatic management using over-the-counter cold medicine.  Gave patient follow-up information for ear nose and throat.  Patient was given a note for work.   He voiced understanding, all questions were answered and he was stable at discharge.  Clinical Course as of 11/23/24 0011  Wed Nov 22, 2024  2104 DG Chest 2 View Negative for signs of pneumonia [LD]  2115 CBC with Differential Unremarkable. [LD]  2115 Comprehensive metabolic panel(!) Unremarkable. [LD]  2244 Group A Strep by PCR ([facility] Only) Negative. [LD]  Thu Nov 23, 2024  0010 CT Soft Tissue Neck W Contrast Entered late  Will recommend that patient follow-up with ear nose and throat for direct visualization.  IMPRESSION: 1. No visible mass, but a closed glottis limits evaluation. Given the patient's reported hoarseness, consider correlation with direct inspection. 2. Moderate paranasal sinus mucosal thickening.   [LD]    Clinical Course User Index [LD] Cleaster Tinnie LABOR, PA-C     FINAL CLINICAL IMPRESSION(S) / ED DIAGNOSES   Final diagnoses:  Laryngitis     Rx / DC Orders   ED Discharge Orders     None        Note:  This document was prepared using Dragon voice recognition software and may include unintentional dictation errors.   Cleaster Tinnie LABOR, PA-C 11/23/24 0012    Rexford Reche HERO, MD 11/25/24 [phone]  "

## 2024-11-22 NOTE — Discharge Instructions (Addendum)
 You tested negative for strep and for flu, COVID and RSV.  Your blood work was normal.  Your chest x-ray was normal.  The CT scan of your neck did not show any masses or signs of infection.  They recommended direct visualization which is usually done with the scope.  Please follow-up with the ear nose and throat specialist attached.  I believe your symptoms are caused by a viral infection, please continue to take cold medicine as needed.

## 2024-11-22 NOTE — ED Triage Notes (Signed)
 Pt seen here 2 weeks ago for hoarseness. Symptoms continued.

## 2024-11-22 NOTE — ED Provider Notes (Incomplete)
 "  [facility] Provider Note    Event Date/Time   First MD Initiated Contact with Patient 11/22/24 1818     (approximate)   History   Sore Throat   HPI  Luke [name] is a 44 y.o. male with PMH of hypertension, CKD and cecum mass presents for evaluation of a sore throat.  Patient endorses cough, congestion, sore throat and voice hoarseness.  Patient was seen in the emergency department at the beginning of the month for the same complaint and diagnosed with laryngitis.  Patient reports no improvement.      Physical Exam   Triage Vital Signs: ED Triage Vitals  Encounter Vitals Group     BP 11/22/24 1802 (!) 137/121     Girls Systolic BP Percentile --      Girls Diastolic BP Percentile --      Boys Systolic BP Percentile --      Boys Diastolic BP Percentile --      Pulse Rate 11/22/24 1802 (!) 102     Resp 11/22/24 1802 16     Temp 11/22/24 1801 98.5 F (36.9 C)     Temp Source 11/22/24 1801 Oral     SpO2 11/22/24 1802 97 %     Weight 11/22/24 1802 200 lb 9.9 oz (91 kg)     Height 11/22/24 1802 5' 8 (1.727 m)     Head Circumference --      Peak Flow --      Pain Score 11/22/24 1802 5     Pain Loc --      Pain Education --      Exclude from Growth Chart --     Most recent vital signs: Vitals:   11/22/24 1802 11/22/24 1803  BP: (!) 137/121 (!) 135/103  Pulse: (!) 102 95  Resp: 16   Temp:    SpO2: 97%    General: Awake, no distress.  CV:  Good peripheral perfusion.  RRR. Resp:  Normal effort.  Lots of rhonchi bilaterally. Abd:  No distention.  Other:  Oral mucous membranes are moist, pharynx is erythematous, no tonsillar enlargement.   ED Results / Procedures / Treatments   Labs (all labs ordered are listed, but only abnormal results are displayed) Labs Reviewed - No data to display     RADIOLOGY  CT soft tissue of the neck obtained, interpreted the images as well as reviewed the radiologist report.  Images did not show any  visible mass but close glottis limited evaluation.  Radiologist recommended correlation with direct inspection.  There is moderate paranasal sinus mucosal thickening.    PROCEDURES:  Critical Care performed: {CriticalCareYesNo:19197::Yes, see critical care procedure note(s),No}  Procedures   MEDICATIONS ORDERED IN ED: Medications - No data to display   IMPRESSION / MDM / ASSESSMENT AND PLAN / ED COURSE  I reviewed the triage vital signs and the nursing notes.                              Differential diagnosis includes, but is not limited to, ***  Patient's presentation is most consistent with {EM COPA:27473}  *** {If the patient is on the monitor, remove the brackets and asterisks on the sentence below and remember to document it as a Procedure as well. Otherwise delete the sentence below:1} {**The patient is on the cardiac monitor to evaluate for evidence of arrhythmia and/or significant heart rate changes.**} {Remember to include,  when applicable, any/all of the following data: independent review of imaging independent review of labs (comment specifically on pertinent positives and negatives) review of specific prior hospitalizations, PCP/specialist notes, etc. discuss meds given and prescribed document any discussion with consultants (including hospitalists) any clinical decision tools you used and why (PECARN, NEXUS, etc.) did you consider admitting the patient? document social determinants of health affecting patient's care (homelessness, inability to follow up in a timely fashion, etc) document any pre-existing conditions increasing risk on current visit (e.g. diabetes and HTN increasing danger of high-risk chest pain/ACS) describes what meds you gave (especially parenteral) and why any other interventions?:1} Clinical Course as of 11/22/24 2300  Wed Nov 22, 2024  2104 DG Chest 2 View Negative for signs of pneumonia [LD]  2115 CBC with Differential Unremarkable.  [LD]  2115 Comprehensive metabolic panel(!) Unremarkable. [LD]  2244 Group A Strep by PCR ([facility] Only) Negative. [LD]    Clinical Course User Index [LD] Cleaster Tinnie LABOR, PA-C     FINAL CLINICAL IMPRESSION(S) / ED DIAGNOSES   Final diagnoses:  None     Rx / DC Orders   ED Discharge Orders     None        Note:  This document was prepared using Dragon voice recognition software and may include unintentional dictation errors. "
